# Patient Record
Sex: Male | Born: 1970
Health system: Southern US, Community
[De-identification: ages and names within clinical notes are randomized; demographics above are authoritative.]

## PROBLEM LIST (undated history)

## (undated) ENCOUNTER — Emergency Department (HOSPITAL_COMMUNITY): Admission: EM | Payer: Medicaid Other

## (undated) DIAGNOSIS — Z22322 Carrier or suspected carrier of Methicillin resistant Staphylococcus aureus: Secondary | ICD-10-CM

## (undated) DIAGNOSIS — F32A Depression, unspecified: Secondary | ICD-10-CM

## (undated) DIAGNOSIS — R358 Other polyuria: Secondary | ICD-10-CM

## (undated) DIAGNOSIS — J189 Pneumonia, unspecified organism: Secondary | ICD-10-CM

## (undated) DIAGNOSIS — F419 Anxiety disorder, unspecified: Secondary | ICD-10-CM

## (undated) DIAGNOSIS — R269 Unspecified abnormalities of gait and mobility: Secondary | ICD-10-CM

## (undated) DIAGNOSIS — K611 Rectal abscess: Secondary | ICD-10-CM

## (undated) DIAGNOSIS — E1165 Type 2 diabetes mellitus with hyperglycemia: Secondary | ICD-10-CM

## (undated) DIAGNOSIS — N5089 Other specified disorders of the male genital organs: Secondary | ICD-10-CM

## (undated) DIAGNOSIS — I10 Essential (primary) hypertension: Secondary | ICD-10-CM

## (undated) DIAGNOSIS — J45909 Unspecified asthma, uncomplicated: Secondary | ICD-10-CM

## (undated) DIAGNOSIS — K219 Gastro-esophageal reflux disease without esophagitis: Secondary | ICD-10-CM

## (undated) DIAGNOSIS — F329 Major depressive disorder, single episode, unspecified: Secondary | ICD-10-CM

## (undated) DIAGNOSIS — F640 Transsexualism: Secondary | ICD-10-CM

## (undated) DIAGNOSIS — B20 Human immunodeficiency virus [HIV] disease: Secondary | ICD-10-CM

## (undated) HISTORY — DX: Unspecified abnormalities of gait and mobility: R26.9

## (undated) HISTORY — DX: Type 2 diabetes mellitus with hyperglycemia: E11.65

## (undated) HISTORY — DX: Other polyuria: R35.8

## (undated) HISTORY — DX: Transsexualism: F64.0

## (undated) HISTORY — DX: Other specified disorders of the male genital organs: N50.89

## (undated) HISTORY — DX: Rectal abscess: K61.1

## (undated) HISTORY — DX: Anxiety disorder, unspecified: F41.9

## (undated) HISTORY — PX: APPENDECTOMY: SHX54

---

## 2004-02-28 ENCOUNTER — Emergency Department (HOSPITAL_COMMUNITY): Admission: EM | Admit: 2004-02-28 | Discharge: 2004-02-29 | Payer: Self-pay | Admitting: Emergency Medicine

## 2005-03-29 ENCOUNTER — Ambulatory Visit: Payer: Self-pay | Admitting: Family Medicine

## 2005-04-07 ENCOUNTER — Ambulatory Visit: Payer: Self-pay | Admitting: Family Medicine

## 2005-04-22 ENCOUNTER — Ambulatory Visit: Payer: Self-pay | Admitting: *Deleted

## 2005-06-30 ENCOUNTER — Emergency Department (HOSPITAL_COMMUNITY): Admission: EM | Admit: 2005-06-30 | Discharge: 2005-06-30 | Payer: Self-pay

## 2005-10-04 ENCOUNTER — Encounter: Admission: RE | Admit: 2005-10-04 | Discharge: 2005-10-04 | Payer: Self-pay | Admitting: Infectious Diseases

## 2005-10-04 ENCOUNTER — Ambulatory Visit: Payer: Self-pay | Admitting: Infectious Diseases

## 2005-10-04 ENCOUNTER — Encounter (INDEPENDENT_AMBULATORY_CARE_PROVIDER_SITE_OTHER): Payer: Self-pay | Admitting: *Deleted

## 2005-10-04 LAB — CONVERTED CEMR LAB: CD4 Count: 790 microliters

## 2005-10-15 ENCOUNTER — Emergency Department (HOSPITAL_COMMUNITY): Admission: EM | Admit: 2005-10-15 | Discharge: 2005-10-15 | Payer: Self-pay | Admitting: *Deleted

## 2005-12-05 ENCOUNTER — Ambulatory Visit: Payer: Self-pay | Admitting: Infectious Diseases

## 2005-12-20 ENCOUNTER — Ambulatory Visit: Payer: Self-pay | Admitting: Internal Medicine

## 2006-03-29 ENCOUNTER — Encounter (INDEPENDENT_AMBULATORY_CARE_PROVIDER_SITE_OTHER): Payer: Self-pay | Admitting: *Deleted

## 2006-03-29 ENCOUNTER — Encounter: Admission: RE | Admit: 2006-03-29 | Discharge: 2006-03-29 | Payer: Self-pay | Admitting: Infectious Diseases

## 2006-03-29 ENCOUNTER — Ambulatory Visit: Payer: Self-pay | Admitting: Infectious Diseases

## 2006-03-29 LAB — CONVERTED CEMR LAB
ALT: 81 units/L — ABNORMAL HIGH (ref 0–40)
AST: 44 units/L — ABNORMAL HIGH (ref 0–37)
Albumin: 4 g/dL (ref 3.5–5.2)
Basophils Absolute: 0 10*3/uL (ref 0.0–0.1)
Bilirubin Urine: NEGATIVE
CO2: 26 meq/L (ref 19–32)
Chloride: 104 meq/L (ref 96–112)
Creatinine, Ser: 0.89 mg/dL (ref 0.40–1.50)
Eosinophils Relative: 1 % (ref 0–5)
HCT: 45.3 % (ref 39.0–52.0)
HIV 1 RNA Quant: 2830 copies/mL
HIV 1 RNA Quant: 2830 copies/mL — ABNORMAL HIGH (ref <50)
HIV-1 RNA Quant, Log: 3.45 — ABNORMAL HIGH (ref <1.70)
Hemoglobin: 16.1 g/dL (ref 13.0–17.0)
Hgb urine dipstick: NEGATIVE
Ketones, ur: NEGATIVE mg/dL
Leukocyte count, blood: 7.9 10*9/L (ref 4.0–10.5)
Lymphs Abs: 3.5 10*3/uL — ABNORMAL HIGH (ref 0.7–3.3)
MCV: 89 fL (ref 78.0–100.0)
Neutro Abs: 3.1 10*3/uL (ref 1.7–7.7)
Nitrite: NEGATIVE
Platelets: 268 10*3/uL (ref 150–400)
Protein, ur: NEGATIVE mg/dL
RBC: 5.09 M/uL (ref 4.22–5.81)
Specific Gravity, Urine: 1.013 (ref 1.005–1.03)
Total Bilirubin: 0.5 mg/dL (ref 0.3–1.2)
Urine Glucose: NEGATIVE mg/dL
Urobilinogen, UA: 0.2 (ref 0.0–1.0)
pH: 6 (ref 5.0–8.0)

## 2006-04-11 ENCOUNTER — Ambulatory Visit: Payer: Self-pay | Admitting: Infectious Diseases

## 2006-04-14 DIAGNOSIS — L732 Hidradenitis suppurativa: Secondary | ICD-10-CM

## 2006-04-14 DIAGNOSIS — B20 Human immunodeficiency virus [HIV] disease: Secondary | ICD-10-CM

## 2006-07-04 ENCOUNTER — Ambulatory Visit: Payer: Self-pay | Admitting: Internal Medicine

## 2006-07-18 ENCOUNTER — Emergency Department (HOSPITAL_COMMUNITY): Admission: EM | Admit: 2006-07-18 | Discharge: 2006-07-19 | Payer: Self-pay | Admitting: *Deleted

## 2006-07-18 ENCOUNTER — Ambulatory Visit: Payer: Self-pay | Admitting: Internal Medicine

## 2006-07-20 ENCOUNTER — Encounter (INDEPENDENT_AMBULATORY_CARE_PROVIDER_SITE_OTHER): Payer: Self-pay | Admitting: Infectious Diseases

## 2006-07-21 ENCOUNTER — Encounter: Payer: Self-pay | Admitting: Infectious Diseases

## 2006-07-31 ENCOUNTER — Encounter (INDEPENDENT_AMBULATORY_CARE_PROVIDER_SITE_OTHER): Payer: Self-pay | Admitting: Specialist

## 2006-07-31 ENCOUNTER — Ambulatory Visit (HOSPITAL_COMMUNITY): Admission: RE | Admit: 2006-07-31 | Discharge: 2006-08-01 | Payer: Self-pay | Admitting: Otolaryngology

## 2006-08-07 ENCOUNTER — Encounter (INDEPENDENT_AMBULATORY_CARE_PROVIDER_SITE_OTHER): Payer: Self-pay | Admitting: *Deleted

## 2006-08-07 LAB — CONVERTED CEMR LAB

## 2006-08-20 ENCOUNTER — Encounter (INDEPENDENT_AMBULATORY_CARE_PROVIDER_SITE_OTHER): Payer: Self-pay | Admitting: *Deleted

## 2006-09-02 ENCOUNTER — Ambulatory Visit: Payer: Self-pay | Admitting: Internal Medicine

## 2006-09-02 ENCOUNTER — Inpatient Hospital Stay (HOSPITAL_COMMUNITY): Admission: EM | Admit: 2006-09-02 | Discharge: 2006-09-06 | Payer: Self-pay | Admitting: Emergency Medicine

## 2006-09-05 ENCOUNTER — Telehealth (INDEPENDENT_AMBULATORY_CARE_PROVIDER_SITE_OTHER): Payer: Self-pay | Admitting: Infectious Diseases

## 2006-09-07 ENCOUNTER — Telehealth (INDEPENDENT_AMBULATORY_CARE_PROVIDER_SITE_OTHER): Payer: Self-pay | Admitting: *Deleted

## 2006-09-20 ENCOUNTER — Encounter: Payer: Self-pay | Admitting: Infectious Diseases

## 2006-10-14 ENCOUNTER — Inpatient Hospital Stay (HOSPITAL_COMMUNITY): Admission: EM | Admit: 2006-10-14 | Discharge: 2006-10-19 | Payer: Self-pay | Admitting: Emergency Medicine

## 2006-11-14 ENCOUNTER — Inpatient Hospital Stay (HOSPITAL_COMMUNITY): Admission: EM | Admit: 2006-11-14 | Discharge: 2006-11-18 | Payer: Self-pay | Admitting: Emergency Medicine

## 2006-11-14 ENCOUNTER — Encounter (INDEPENDENT_AMBULATORY_CARE_PROVIDER_SITE_OTHER): Payer: Self-pay | Admitting: Otolaryngology

## 2006-11-14 ENCOUNTER — Ambulatory Visit: Payer: Self-pay | Admitting: Internal Medicine

## 2006-11-21 ENCOUNTER — Encounter: Admission: RE | Admit: 2006-11-21 | Discharge: 2006-11-21 | Payer: Self-pay | Admitting: Internal Medicine

## 2006-11-21 ENCOUNTER — Ambulatory Visit: Payer: Self-pay | Admitting: Internal Medicine

## 2006-11-21 ENCOUNTER — Encounter (INDEPENDENT_AMBULATORY_CARE_PROVIDER_SITE_OTHER): Payer: Self-pay | Admitting: *Deleted

## 2006-12-05 ENCOUNTER — Inpatient Hospital Stay (HOSPITAL_COMMUNITY): Admission: EM | Admit: 2006-12-05 | Discharge: 2006-12-07 | Payer: Self-pay | Admitting: Emergency Medicine

## 2006-12-05 ENCOUNTER — Telehealth: Payer: Self-pay

## 2006-12-05 ENCOUNTER — Telehealth: Payer: Self-pay | Admitting: Internal Medicine

## 2006-12-07 ENCOUNTER — Telehealth: Payer: Self-pay | Admitting: Internal Medicine

## 2006-12-08 ENCOUNTER — Ambulatory Visit: Payer: Self-pay | Admitting: Internal Medicine

## 2006-12-08 DIAGNOSIS — J390 Retropharyngeal and parapharyngeal abscess: Secondary | ICD-10-CM | POA: Insufficient documentation

## 2006-12-26 ENCOUNTER — Encounter: Payer: Self-pay | Admitting: Internal Medicine

## 2007-01-08 ENCOUNTER — Emergency Department (HOSPITAL_COMMUNITY): Admission: EM | Admit: 2007-01-08 | Discharge: 2007-01-09 | Payer: Self-pay | Admitting: Emergency Medicine

## 2007-01-11 ENCOUNTER — Inpatient Hospital Stay (HOSPITAL_COMMUNITY): Admission: EM | Admit: 2007-01-11 | Discharge: 2007-01-12 | Payer: Self-pay | Admitting: Emergency Medicine

## 2007-01-15 ENCOUNTER — Encounter: Payer: Self-pay | Admitting: Internal Medicine

## 2007-01-16 ENCOUNTER — Encounter: Payer: Self-pay | Admitting: Internal Medicine

## 2007-01-17 ENCOUNTER — Telehealth: Payer: Self-pay | Admitting: Internal Medicine

## 2007-01-17 ENCOUNTER — Telehealth: Payer: Self-pay

## 2007-02-14 ENCOUNTER — Encounter: Admission: RE | Admit: 2007-02-14 | Discharge: 2007-02-14 | Payer: Self-pay | Admitting: Internal Medicine

## 2007-02-14 ENCOUNTER — Ambulatory Visit: Payer: Self-pay | Admitting: Internal Medicine

## 2007-02-14 LAB — CONVERTED CEMR LAB
Alkaline Phosphatase: 84 units/L (ref 39–117)
BUN: 15 mg/dL (ref 6–23)
Basophils Relative: 0 % (ref 0–1)
Eosinophils Absolute: 0.1 10*3/uL (ref 0.0–0.7)
Eosinophils Relative: 1 % (ref 0–5)
Glucose, Bld: 163 mg/dL — ABNORMAL HIGH (ref 70–99)
HCT: 43.2 % (ref 39.0–52.0)
HIV-1 RNA Quant, Log: 3.26 — ABNORMAL HIGH (ref <1.70)
Lymphs Abs: 3.2 10*3/uL (ref 0.7–3.3)
MCHC: 34.7 g/dL (ref 30.0–36.0)
MCV: 92.1 fL (ref 78.0–100.0)
Platelets: 216 10*3/uL (ref 150–400)
RDW: 13.1 % (ref 11.5–14.0)
Sodium: 141 meq/L (ref 135–145)
Total Bilirubin: 0.6 mg/dL (ref 0.3–1.2)
Total Protein: 7.1 g/dL (ref 6.0–8.3)
WBC: 7.6 10*3/uL (ref 4.0–10.5)

## 2007-03-02 ENCOUNTER — Ambulatory Visit: Payer: Self-pay | Admitting: Internal Medicine

## 2007-03-02 DIAGNOSIS — R197 Diarrhea, unspecified: Secondary | ICD-10-CM

## 2007-03-02 DIAGNOSIS — J209 Acute bronchitis, unspecified: Secondary | ICD-10-CM | POA: Insufficient documentation

## 2007-03-12 ENCOUNTER — Telehealth: Payer: Self-pay

## 2007-03-23 ENCOUNTER — Ambulatory Visit: Payer: Self-pay | Admitting: Internal Medicine

## 2007-03-30 ENCOUNTER — Emergency Department (HOSPITAL_COMMUNITY): Admission: EM | Admit: 2007-03-30 | Discharge: 2007-03-30 | Payer: Self-pay | Admitting: Emergency Medicine

## 2007-04-13 ENCOUNTER — Telehealth: Payer: Self-pay | Admitting: Internal Medicine

## 2007-04-24 ENCOUNTER — Ambulatory Visit (HOSPITAL_COMMUNITY): Admission: RE | Admit: 2007-04-24 | Discharge: 2007-04-24 | Payer: Self-pay | Admitting: Infectious Diseases

## 2007-04-24 ENCOUNTER — Ambulatory Visit: Payer: Self-pay | Admitting: Infectious Diseases

## 2007-04-24 ENCOUNTER — Telehealth: Payer: Self-pay

## 2007-04-24 DIAGNOSIS — R05 Cough: Secondary | ICD-10-CM

## 2007-05-08 ENCOUNTER — Emergency Department (HOSPITAL_COMMUNITY): Admission: EM | Admit: 2007-05-08 | Discharge: 2007-05-08 | Payer: Self-pay | Admitting: Emergency Medicine

## 2007-05-11 ENCOUNTER — Emergency Department (HOSPITAL_COMMUNITY): Admission: EM | Admit: 2007-05-11 | Discharge: 2007-05-11 | Payer: Self-pay | Admitting: Emergency Medicine

## 2007-05-13 ENCOUNTER — Inpatient Hospital Stay (HOSPITAL_COMMUNITY): Admission: EM | Admit: 2007-05-13 | Discharge: 2007-05-18 | Payer: Self-pay | Admitting: Otolaryngology

## 2007-05-14 ENCOUNTER — Encounter (INDEPENDENT_AMBULATORY_CARE_PROVIDER_SITE_OTHER): Payer: Self-pay | Admitting: Otolaryngology

## 2007-05-18 ENCOUNTER — Encounter: Payer: Self-pay | Admitting: Internal Medicine

## 2007-05-21 ENCOUNTER — Telehealth: Payer: Self-pay

## 2007-06-12 ENCOUNTER — Encounter (INDEPENDENT_AMBULATORY_CARE_PROVIDER_SITE_OTHER): Payer: Self-pay | Admitting: *Deleted

## 2007-06-12 ENCOUNTER — Encounter: Payer: Self-pay | Admitting: Internal Medicine

## 2007-06-18 ENCOUNTER — Ambulatory Visit: Payer: Self-pay | Admitting: Internal Medicine

## 2007-06-18 ENCOUNTER — Encounter: Admission: RE | Admit: 2007-06-18 | Discharge: 2007-06-18 | Payer: Self-pay | Admitting: Internal Medicine

## 2007-06-18 LAB — CONVERTED CEMR LAB
HIV 1 RNA Quant: 2200 copies/mL — ABNORMAL HIGH (ref <50)
HIV-1 RNA Quant, Log: 3.34 — ABNORMAL HIGH (ref <1.70)

## 2007-06-19 ENCOUNTER — Encounter: Payer: Self-pay | Admitting: Internal Medicine

## 2007-06-19 LAB — CONVERTED CEMR LAB
BUN: 11 mg/dL (ref 6–23)
CO2: 25 meq/L (ref 19–32)
Creatinine, Ser: 0.69 mg/dL (ref 0.40–1.50)
Eosinophils Relative: 1 % (ref 0–5)
Glucose, Bld: 100 mg/dL — ABNORMAL HIGH (ref 70–99)
HCT: 47.1 % (ref 39.0–52.0)
Hemoglobin: 16.1 g/dL (ref 13.0–17.0)
Lymphocytes Relative: 45 % (ref 12–46)
Lymphs Abs: 4.1 10*3/uL — ABNORMAL HIGH (ref 0.7–4.0)
Monocytes Absolute: 1.2 10*3/uL — ABNORMAL HIGH (ref 0.1–1.0)
Total Bilirubin: 0.6 mg/dL (ref 0.3–1.2)
WBC: 9.1 10*3/uL (ref 4.0–10.5)

## 2007-06-20 ENCOUNTER — Ambulatory Visit: Payer: Self-pay | Admitting: Internal Medicine

## 2007-06-20 ENCOUNTER — Telehealth: Payer: Self-pay | Admitting: Internal Medicine

## 2007-06-20 DIAGNOSIS — L0293 Carbuncle, unspecified: Secondary | ICD-10-CM

## 2007-06-20 DIAGNOSIS — L0292 Furuncle, unspecified: Secondary | ICD-10-CM | POA: Insufficient documentation

## 2007-06-22 ENCOUNTER — Emergency Department (HOSPITAL_COMMUNITY): Admission: EM | Admit: 2007-06-22 | Discharge: 2007-06-22 | Payer: Self-pay | Admitting: Emergency Medicine

## 2007-07-13 ENCOUNTER — Ambulatory Visit: Payer: Self-pay | Admitting: Internal Medicine

## 2007-09-09 ENCOUNTER — Emergency Department (HOSPITAL_COMMUNITY): Admission: EM | Admit: 2007-09-09 | Discharge: 2007-09-09 | Payer: Self-pay | Admitting: Emergency Medicine

## 2007-09-10 ENCOUNTER — Inpatient Hospital Stay (HOSPITAL_COMMUNITY): Admission: AD | Admit: 2007-09-10 | Discharge: 2007-09-17 | Payer: Self-pay | Admitting: *Deleted

## 2007-09-10 ENCOUNTER — Ambulatory Visit: Payer: Self-pay | Admitting: *Deleted

## 2007-10-05 ENCOUNTER — Telehealth: Payer: Self-pay | Admitting: Internal Medicine

## 2007-10-05 ENCOUNTER — Emergency Department (HOSPITAL_COMMUNITY): Admission: EM | Admit: 2007-10-05 | Discharge: 2007-10-06 | Payer: Self-pay | Admitting: Emergency Medicine

## 2007-10-12 ENCOUNTER — Emergency Department (HOSPITAL_COMMUNITY): Admission: EM | Admit: 2007-10-12 | Discharge: 2007-10-12 | Payer: Self-pay | Admitting: Emergency Medicine

## 2007-10-15 ENCOUNTER — Ambulatory Visit: Payer: Self-pay | Admitting: Internal Medicine

## 2007-10-15 ENCOUNTER — Emergency Department (HOSPITAL_COMMUNITY): Admission: EM | Admit: 2007-10-15 | Discharge: 2007-10-15 | Payer: Self-pay | Admitting: Family Medicine

## 2007-10-15 ENCOUNTER — Encounter: Admission: RE | Admit: 2007-10-15 | Discharge: 2007-10-15 | Payer: Self-pay | Admitting: Internal Medicine

## 2007-10-15 LAB — CONVERTED CEMR LAB
ALT: 61 units/L — ABNORMAL HIGH (ref 0–53)
Albumin: 4.1 g/dL (ref 3.5–5.2)
Basophils Absolute: 0 10*3/uL (ref 0.0–0.1)
CO2: 23 meq/L (ref 19–32)
Calcium: 9.3 mg/dL (ref 8.4–10.5)
Chloride: 108 meq/L (ref 96–112)
Creatinine, Ser: 0.74 mg/dL (ref 0.40–1.50)
HIV 1 RNA Quant: 1860 copies/mL — ABNORMAL HIGH (ref <50)
Hemoglobin: 16.9 g/dL (ref 13.0–17.0)
Lymphocytes Relative: 38 % (ref 12–46)
Monocytes Absolute: 1.1 10*3/uL — ABNORMAL HIGH (ref 0.1–1.0)
Monocytes Relative: 10 % (ref 3–12)
Neutro Abs: 5.6 10*3/uL (ref 1.7–7.7)
RBC: 5.24 M/uL (ref 4.22–5.81)
RDW: 13 % (ref 11.5–15.5)

## 2007-10-17 ENCOUNTER — Ambulatory Visit: Payer: Self-pay | Admitting: Internal Medicine

## 2007-10-17 ENCOUNTER — Inpatient Hospital Stay (HOSPITAL_COMMUNITY): Admission: AD | Admit: 2007-10-17 | Discharge: 2007-10-19 | Payer: Self-pay | Admitting: Internal Medicine

## 2007-10-17 ENCOUNTER — Telehealth: Payer: Self-pay | Admitting: Internal Medicine

## 2007-10-17 DIAGNOSIS — L03818 Cellulitis of other sites: Secondary | ICD-10-CM

## 2007-10-17 DIAGNOSIS — L02818 Cutaneous abscess of other sites: Secondary | ICD-10-CM

## 2007-10-17 DIAGNOSIS — F418 Other specified anxiety disorders: Secondary | ICD-10-CM

## 2007-10-23 ENCOUNTER — Telehealth (INDEPENDENT_AMBULATORY_CARE_PROVIDER_SITE_OTHER): Payer: Self-pay | Admitting: *Deleted

## 2007-10-30 ENCOUNTER — Telehealth: Payer: Self-pay | Admitting: Internal Medicine

## 2007-10-31 ENCOUNTER — Ambulatory Visit: Payer: Self-pay

## 2007-10-31 ENCOUNTER — Encounter (INDEPENDENT_AMBULATORY_CARE_PROVIDER_SITE_OTHER): Payer: Self-pay | Admitting: Internal Medicine

## 2007-10-31 DIAGNOSIS — Z789 Other specified health status: Secondary | ICD-10-CM | POA: Insufficient documentation

## 2007-10-31 DIAGNOSIS — F640 Transsexualism: Secondary | ICD-10-CM

## 2007-11-01 ENCOUNTER — Encounter (INDEPENDENT_AMBULATORY_CARE_PROVIDER_SITE_OTHER): Payer: Self-pay | Admitting: *Deleted

## 2007-11-09 ENCOUNTER — Emergency Department (HOSPITAL_COMMUNITY): Admission: EM | Admit: 2007-11-09 | Discharge: 2007-11-09 | Payer: Self-pay | Admitting: Emergency Medicine

## 2007-11-26 ENCOUNTER — Ambulatory Visit: Payer: Self-pay | Admitting: Infectious Diseases

## 2007-11-28 ENCOUNTER — Telehealth (INDEPENDENT_AMBULATORY_CARE_PROVIDER_SITE_OTHER): Payer: Self-pay | Admitting: *Deleted

## 2007-12-24 ENCOUNTER — Telehealth (INDEPENDENT_AMBULATORY_CARE_PROVIDER_SITE_OTHER): Payer: Self-pay | Admitting: *Deleted

## 2007-12-26 ENCOUNTER — Telehealth (INDEPENDENT_AMBULATORY_CARE_PROVIDER_SITE_OTHER): Payer: Self-pay | Admitting: *Deleted

## 2007-12-27 ENCOUNTER — Encounter (INDEPENDENT_AMBULATORY_CARE_PROVIDER_SITE_OTHER): Payer: Self-pay | Admitting: *Deleted

## 2007-12-27 ENCOUNTER — Ambulatory Visit: Payer: Self-pay | Admitting: Infectious Diseases

## 2007-12-28 ENCOUNTER — Telehealth (INDEPENDENT_AMBULATORY_CARE_PROVIDER_SITE_OTHER): Payer: Self-pay | Admitting: *Deleted

## 2008-01-11 ENCOUNTER — Telehealth: Payer: Self-pay | Admitting: Infectious Diseases

## 2008-01-14 ENCOUNTER — Telehealth (INDEPENDENT_AMBULATORY_CARE_PROVIDER_SITE_OTHER): Payer: Self-pay | Admitting: *Deleted

## 2008-01-14 ENCOUNTER — Ambulatory Visit: Payer: Self-pay | Admitting: Infectious Diseases

## 2008-01-16 ENCOUNTER — Telehealth (INDEPENDENT_AMBULATORY_CARE_PROVIDER_SITE_OTHER): Payer: Self-pay | Admitting: *Deleted

## 2008-01-27 ENCOUNTER — Encounter: Payer: Self-pay | Admitting: Infectious Diseases

## 2008-02-07 ENCOUNTER — Ambulatory Visit: Payer: Self-pay | Admitting: Infectious Diseases

## 2008-02-07 DIAGNOSIS — J45909 Unspecified asthma, uncomplicated: Secondary | ICD-10-CM

## 2008-02-07 DIAGNOSIS — M545 Low back pain: Secondary | ICD-10-CM

## 2008-02-07 LAB — CONVERTED CEMR LAB
Basophils Absolute: 0 K/uL (ref 0.0–0.1)
Basophils Relative: 0 % (ref 0–1)
Eosinophils Absolute: 0.1 K/uL (ref 0.0–0.7)
Eosinophils Relative: 1 % (ref 0–5)
HCT: 44.8 % (ref 39.0–52.0)
Hemoglobin: 15.4 g/dL (ref 13.0–17.0)
Lymphocytes Relative: 39 % (ref 12–46)
Lymphs Abs: 3.3 K/uL (ref 0.7–4.0)
MCHC: 34.4 g/dL (ref 30.0–36.0)
MCV: 90.3 fL (ref 78.0–100.0)
Monocytes Absolute: 1.1 K/uL — ABNORMAL HIGH (ref 0.1–1.0)
Monocytes Relative: 13 % — ABNORMAL HIGH (ref 3–12)
Neutro Abs: 3.9 K/uL (ref 1.7–7.7)
Neutrophils Relative %: 46 % (ref 43–77)
Platelets: 240 K/uL (ref 150–400)
RBC: 4.96 M/uL (ref 4.22–5.81)
RDW: 12.9 % (ref 11.5–15.5)
WBC: 8.4 10*3/microliter (ref 4.0–10.5)

## 2008-02-11 ENCOUNTER — Telehealth: Payer: Self-pay | Admitting: Infectious Diseases

## 2008-02-11 ENCOUNTER — Ambulatory Visit (HOSPITAL_COMMUNITY): Admission: RE | Admit: 2008-02-11 | Discharge: 2008-02-11 | Payer: Self-pay | Admitting: Infectious Diseases

## 2008-02-28 ENCOUNTER — Telehealth (INDEPENDENT_AMBULATORY_CARE_PROVIDER_SITE_OTHER): Payer: Self-pay | Admitting: *Deleted

## 2008-03-25 ENCOUNTER — Telehealth: Payer: Self-pay

## 2008-04-08 ENCOUNTER — Encounter: Payer: Self-pay | Admitting: Infectious Diseases

## 2008-04-27 ENCOUNTER — Observation Stay (HOSPITAL_COMMUNITY): Admission: EM | Admit: 2008-04-27 | Discharge: 2008-04-29 | Payer: Self-pay | Admitting: Emergency Medicine

## 2008-04-27 ENCOUNTER — Ambulatory Visit: Payer: Self-pay | Admitting: Internal Medicine

## 2008-05-02 ENCOUNTER — Telehealth: Payer: Self-pay | Admitting: Infectious Diseases

## 2008-05-06 ENCOUNTER — Ambulatory Visit: Payer: Self-pay | Admitting: Infectious Diseases

## 2008-05-07 ENCOUNTER — Telehealth: Payer: Self-pay | Admitting: Infectious Diseases

## 2008-05-12 ENCOUNTER — Telehealth (INDEPENDENT_AMBULATORY_CARE_PROVIDER_SITE_OTHER): Payer: Self-pay | Admitting: *Deleted

## 2008-05-14 ENCOUNTER — Encounter: Payer: Self-pay | Admitting: Infectious Diseases

## 2008-05-15 ENCOUNTER — Telehealth: Payer: Self-pay

## 2008-05-19 ENCOUNTER — Encounter: Payer: Self-pay | Admitting: Infectious Diseases

## 2008-05-27 ENCOUNTER — Telehealth (INDEPENDENT_AMBULATORY_CARE_PROVIDER_SITE_OTHER): Payer: Self-pay | Admitting: *Deleted

## 2008-06-17 ENCOUNTER — Ambulatory Visit: Payer: Self-pay | Admitting: Infectious Diseases

## 2008-06-18 ENCOUNTER — Telehealth (INDEPENDENT_AMBULATORY_CARE_PROVIDER_SITE_OTHER): Payer: Self-pay | Admitting: *Deleted

## 2008-07-15 ENCOUNTER — Emergency Department (HOSPITAL_COMMUNITY): Admission: EM | Admit: 2008-07-15 | Discharge: 2008-07-15 | Payer: Self-pay | Admitting: Emergency Medicine

## 2008-07-15 ENCOUNTER — Telehealth: Payer: Self-pay | Admitting: Infectious Diseases

## 2008-07-16 ENCOUNTER — Telehealth: Payer: Self-pay | Admitting: Infectious Diseases

## 2008-07-22 ENCOUNTER — Telehealth: Payer: Self-pay

## 2008-07-25 ENCOUNTER — Ambulatory Visit: Payer: Self-pay | Admitting: Psychiatry

## 2008-07-25 ENCOUNTER — Inpatient Hospital Stay (HOSPITAL_COMMUNITY): Admission: RE | Admit: 2008-07-25 | Discharge: 2008-08-06 | Payer: Self-pay | Admitting: Psychiatry

## 2008-07-28 ENCOUNTER — Telehealth: Payer: Self-pay | Admitting: Infectious Diseases

## 2008-08-07 ENCOUNTER — Emergency Department (HOSPITAL_COMMUNITY): Admission: EM | Admit: 2008-08-07 | Discharge: 2008-08-08 | Payer: Self-pay | Admitting: Emergency Medicine

## 2008-08-11 ENCOUNTER — Encounter: Payer: Self-pay | Admitting: Infectious Diseases

## 2008-08-12 ENCOUNTER — Ambulatory Visit: Payer: Self-pay | Admitting: Infectious Diseases

## 2008-08-12 LAB — CONVERTED CEMR LAB
Band Neutrophils: 0 % (ref 0–10)
Basophils Absolute: 0 10*3/uL (ref 0.0–0.1)
Chloride: 104 meq/L (ref 96–112)
Creatinine, Ser: 0.81 mg/dL (ref 0.40–1.50)
HIV 1 RNA Quant: 12400 copies/mL — ABNORMAL HIGH (ref <48)
HIV-1 RNA Quant, Log: 4.09 — ABNORMAL HIGH (ref <1.68)
Hgb A1c MFr Bld: 7.1 %
Lymphocytes Relative: 42 % (ref 12–46)
Lymphs Abs: 3.8 10*3/uL (ref 0.7–4.0)
Neutrophils Relative %: 43 % (ref 43–77)
Platelets: 247 10*3/uL (ref 150–400)
Potassium: 4.4 meq/L (ref 3.5–5.3)
RBC: 4.82 M/uL (ref 4.22–5.81)
Total Bilirubin: 0.4 mg/dL (ref 0.3–1.2)
Total Protein: 7.4 g/dL (ref 6.0–8.3)
WBC: 9.1 10*3/uL (ref 4.0–10.5)

## 2008-08-25 ENCOUNTER — Encounter: Payer: Self-pay | Admitting: Infectious Diseases

## 2008-09-03 ENCOUNTER — Encounter (INDEPENDENT_AMBULATORY_CARE_PROVIDER_SITE_OTHER): Payer: Self-pay | Admitting: *Deleted

## 2008-09-09 ENCOUNTER — Telehealth: Payer: Self-pay | Admitting: Infectious Diseases

## 2008-09-09 ENCOUNTER — Ambulatory Visit: Payer: Self-pay | Admitting: Infectious Diseases

## 2008-09-09 DIAGNOSIS — R7309 Other abnormal glucose: Secondary | ICD-10-CM | POA: Insufficient documentation

## 2008-09-09 LAB — CONVERTED CEMR LAB
ALT: 224 units/L — ABNORMAL HIGH (ref 0–53)
AST: 128 units/L — ABNORMAL HIGH (ref 0–37)
Albumin: 4.4 g/dL (ref 3.5–5.2)
Alkaline Phosphatase: 99 units/L (ref 39–117)
BUN: 13 mg/dL (ref 6–23)
CO2: 25 meq/L (ref 19–32)
Calcium: 9.3 mg/dL (ref 8.4–10.5)
Chloride: 102 meq/L (ref 96–112)
Creatinine, Ser: 0.7 mg/dL (ref 0.40–1.50)
GFR calc Af Amer: >60 mL/min (ref >60)
GFR calc non Af Amer: >60 mL/min (ref >60)
Glucose, Bld: 158 mg/dL — ABNORMAL HIGH (ref 70–99)
HIV 1 RNA Quant: 4680 copies/mL — ABNORMAL HIGH (ref <48)
HIV-1 RNA Quant, Log: 3.67 — ABNORMAL HIGH (ref <1.68)
Potassium: 4.4 meq/L (ref 3.5–5.3)
Sodium: 138 meq/L (ref 135–145)
Total Bilirubin: 0.7 mg/dL (ref 0.3–1.2)
Total Protein: 7.8 g/dL (ref 6.0–8.3)

## 2008-09-10 ENCOUNTER — Telehealth (INDEPENDENT_AMBULATORY_CARE_PROVIDER_SITE_OTHER): Payer: Self-pay | Admitting: *Deleted

## 2008-09-15 ENCOUNTER — Ambulatory Visit: Payer: Self-pay | Admitting: Infectious Diseases

## 2008-09-15 ENCOUNTER — Encounter: Admission: RE | Admit: 2008-09-15 | Discharge: 2008-10-29 | Payer: Self-pay | Admitting: Infectious Diseases

## 2008-09-15 LAB — CONVERTED CEMR LAB
ALT: 208 units/L — ABNORMAL HIGH (ref 0–53)
AST: 110 units/L — ABNORMAL HIGH (ref 0–37)
Albumin: 4.3 g/dL (ref 3.5–5.2)
BUN: 11 mg/dL (ref 6–23)
Basophils Absolute: 0 10*3/uL (ref 0.0–0.1)
Basophils Relative: 0 % (ref 0–1)
Bilirubin, Direct: 0.1 mg/dL (ref 0.0–0.3)
Chloride: 105 meq/L (ref 96–112)
Eosinophils Relative: 1 % (ref 0–5)
Glucose, Bld: 120 mg/dL — ABNORMAL HIGH (ref 70–99)
HCT: 44.8 % (ref 39.0–52.0)
Hemoglobin: 15.4 g/dL (ref 13.0–17.0)
MCHC: 34.4 g/dL (ref 30.0–36.0)
MCV: 89.8 fL (ref 78.0–100.0)
Monocytes Absolute: 0.9 10*3/uL (ref 0.1–1.0)
Phosphorus: 3.2 mg/dL (ref 2.3–4.6)
Potassium: 4.5 meq/L (ref 3.5–5.3)
RDW: 13.3 % (ref 11.5–15.5)

## 2008-09-17 ENCOUNTER — Telehealth: Payer: Self-pay | Admitting: Infectious Diseases

## 2008-09-22 ENCOUNTER — Encounter: Payer: Self-pay | Admitting: Infectious Diseases

## 2008-09-23 ENCOUNTER — Ambulatory Visit: Payer: Self-pay | Admitting: Infectious Diseases

## 2008-09-27 ENCOUNTER — Encounter: Payer: Self-pay | Admitting: Internal Medicine

## 2008-09-27 ENCOUNTER — Observation Stay (HOSPITAL_COMMUNITY): Admission: EM | Admit: 2008-09-27 | Discharge: 2008-09-27 | Payer: Self-pay | Admitting: Emergency Medicine

## 2008-09-27 ENCOUNTER — Ambulatory Visit: Payer: Self-pay | Admitting: *Deleted

## 2008-09-27 DIAGNOSIS — E876 Hypokalemia: Secondary | ICD-10-CM | POA: Insufficient documentation

## 2008-09-27 DIAGNOSIS — R7402 Elevation of levels of lactic acid dehydrogenase (LDH): Secondary | ICD-10-CM | POA: Insufficient documentation

## 2008-09-27 DIAGNOSIS — R740 Nonspecific elevation of levels of transaminase and lactic acid dehydrogenase [LDH]: Secondary | ICD-10-CM

## 2008-09-27 DIAGNOSIS — R220 Localized swelling, mass and lump, head: Secondary | ICD-10-CM

## 2008-09-27 DIAGNOSIS — R079 Chest pain, unspecified: Secondary | ICD-10-CM

## 2008-09-27 DIAGNOSIS — R221 Localized swelling, mass and lump, neck: Secondary | ICD-10-CM

## 2008-09-29 ENCOUNTER — Ambulatory Visit: Payer: Self-pay | Admitting: Infectious Diseases

## 2008-09-29 ENCOUNTER — Encounter: Payer: Self-pay | Admitting: Internal Medicine

## 2008-09-29 LAB — CONVERTED CEMR LAB
ALT: 249 units/L — ABNORMAL HIGH (ref 0–53)
AST: 142 units/L — ABNORMAL HIGH (ref 0–37)
Albumin: 3.9 g/dL (ref 3.5–5.2)
Alkaline Phosphatase: 85 units/L (ref 39–117)
Bilirubin, Direct: 0.2 mg/dL (ref 0.0–0.3)
Indirect Bilirubin: 0.5 mg/dL (ref 0.0–0.9)
Total Bilirubin: 0.7 mg/dL (ref 0.3–1.2)
Total Protein: 7 g/dL (ref 6.0–8.3)

## 2008-09-30 ENCOUNTER — Ambulatory Visit: Payer: Self-pay | Admitting: Infectious Diseases

## 2008-10-02 ENCOUNTER — Ambulatory Visit: Payer: Self-pay | Admitting: Internal Medicine

## 2008-10-04 IMAGING — CT CT NECK W/O CM
2 series · 14 of 24 positions shown · IV contrast (agent unspecified)
Comparison: 05/11/2007 and earlier.

CLINICAL DATA: 35-year-old male with abscess of the jaw, recurrent left salivary gland stones.  The patient reports allergy to IV contrast. 
CT OF THE NECK WITHOUT CONTRAST:
Multidetector CT imaging of the neck was performed following the standard protocol without IV contrast.

[Series 400: reformatted · sagittal · 0.61mm/px · 11 of 111 slices shown (1 of 2)]
[im 10/111  bone]
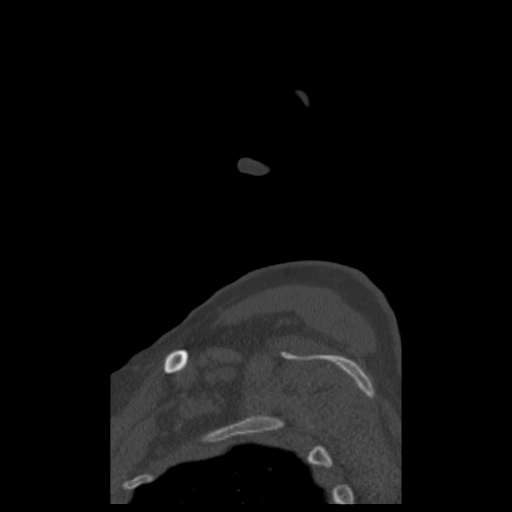
[im 19/111  bone]
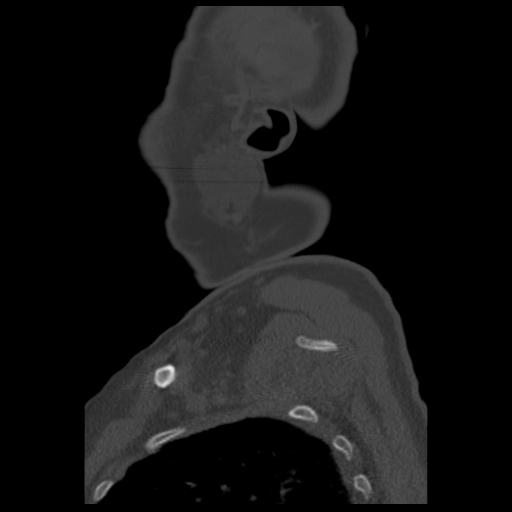
[im 28/111  bone]
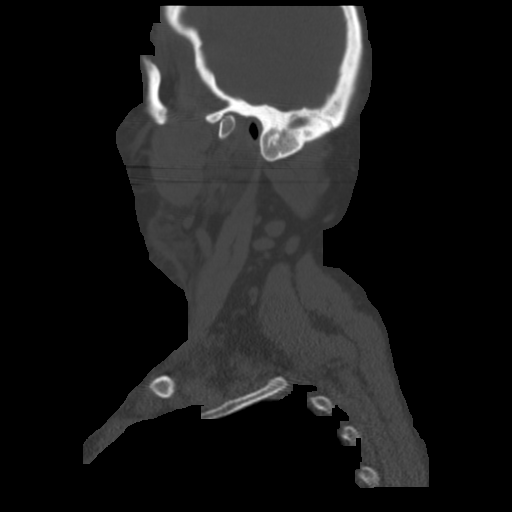
[im 37/111  bone]
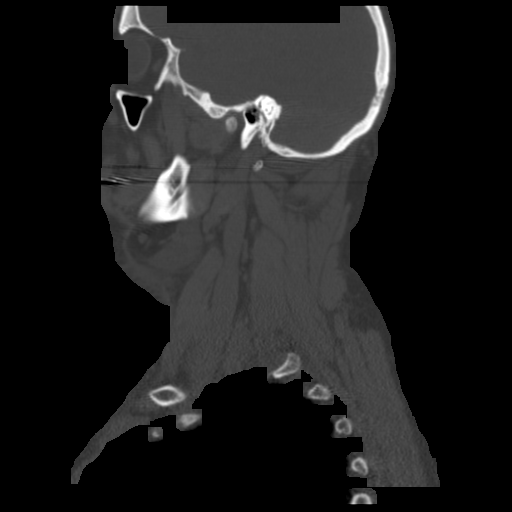
[im 46/111  bone]
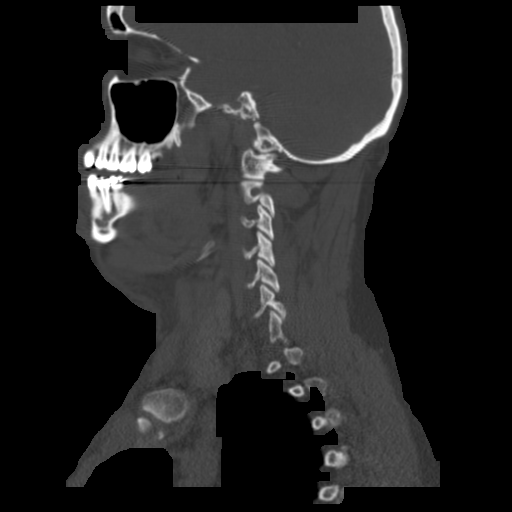
[im 56/111  bone]
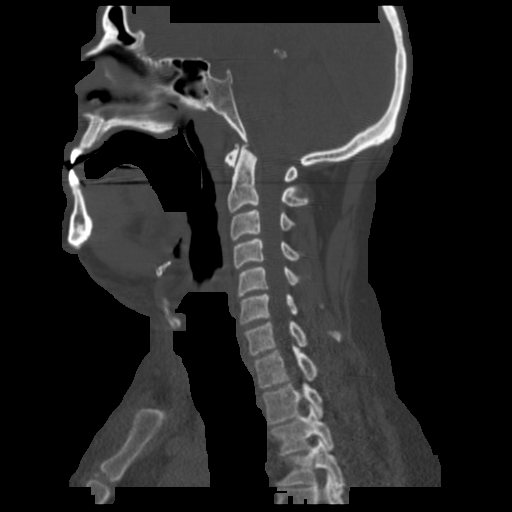
[im 65/111  bone]
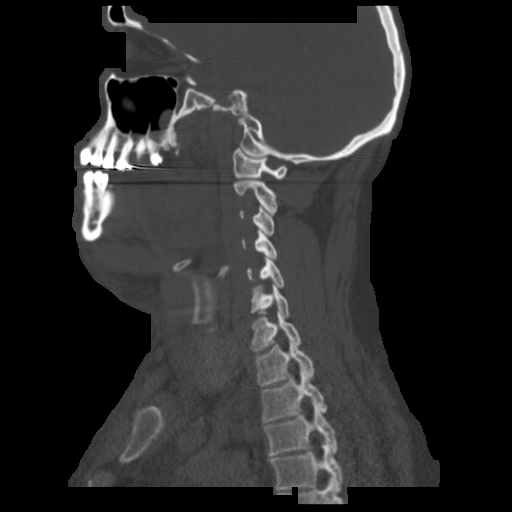
[im 74/111  bone]
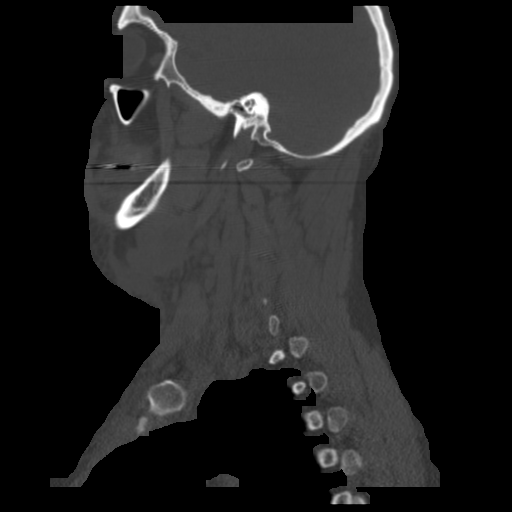
[im 83/111  bone]
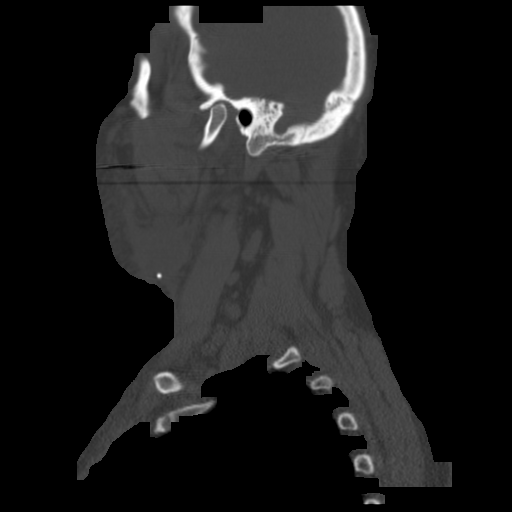
[im 92/111  bone]
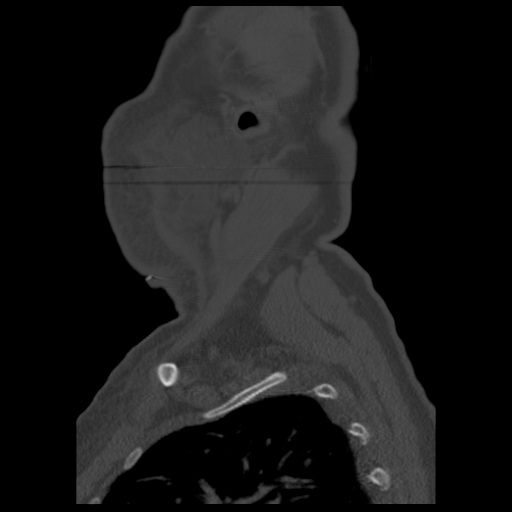
[im 101/111  bone]
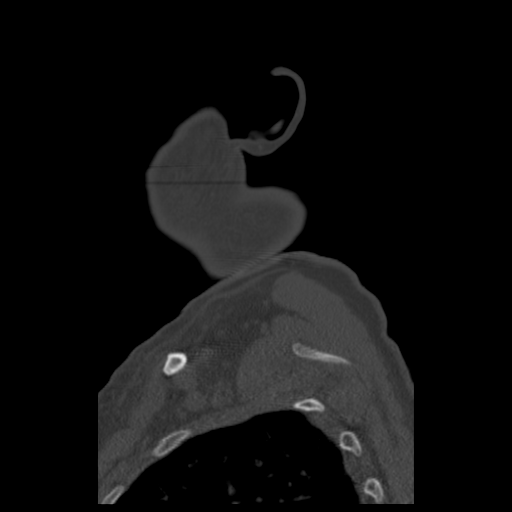

[Series 401: reformatted · coronal · 0.61mm/px · 3 of 121 slices shown (2 of 2)]
[im 49/121  bone]
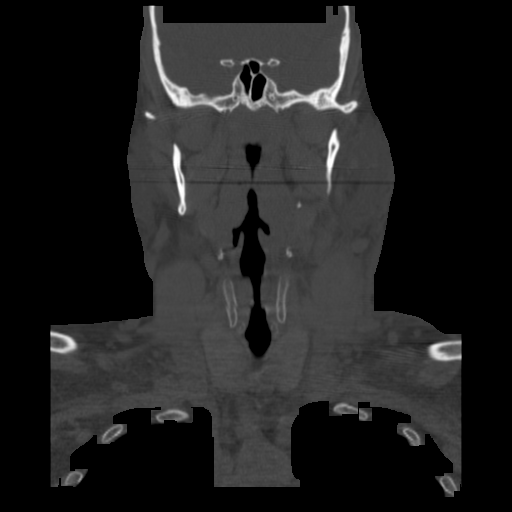
[im 61/121  bone]
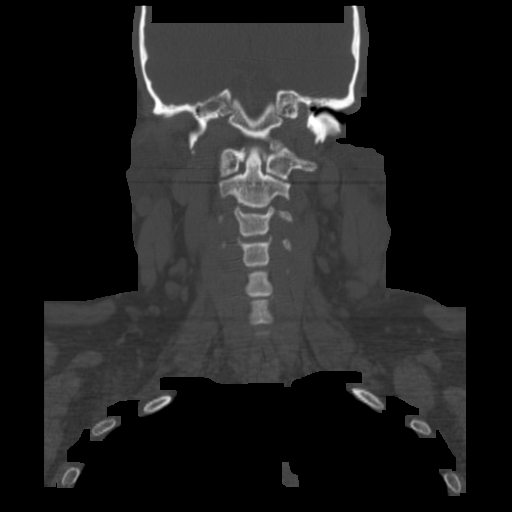
[im 73/121  bone]
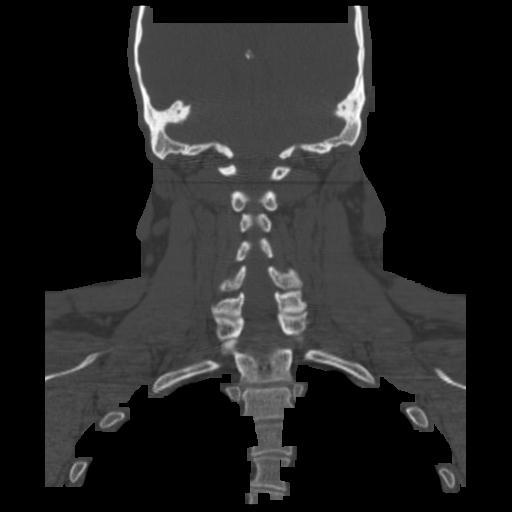

[14 of 24 positions shown; findings below may reference images not displayed]

FINDINGS: Scattered cervical lymph nodes measuring up to 9.3 mm in short-axis (left greater than right levels 1, 2 and 3) are unchanged.  There is a small metallic marker on the skin overlying the enlarged, inflamed and poorly delineated left submandibular gland which contains a stable 4-5 mm calculus. Previously seen ill-defined low density area has enlarged to involve more of the region of the left submandibular gland, but this remains phlegmonous in nature and does not suggest a drainable collection at this time.  The inflammation extends to involve the inferior most aspect of the left parotid gland and asymmetrically enlarged left intraparotid nodes are re-identified.  Subcutaneous fat stranding throughout the left face has increased and crosses midline in the submental region.  
  Probable post-infectious calcification in the region of the left tonsillar pillar is unchanged.  There is effacement of the left hypopharynx, stable.  Visualized lung apices are clear with mild respiratory motion artifact.  Noncontrasted appearance of the upper mediastinum is stable and within normal limits. Thyroid and larynx are within normal limits.  Visualized brain parenchyma, orbits, and osseous structures are within normal limits.  There is stable small mucous retention cyst in the left maxillary sinus.  The mastoid air cells are hypoplastic and appear chronically opacified.
IMPRESSION: 1.  Increasing left facial inflammation associated with the left submandibular gland region. Stable 4-5 mm calculus at the inferior aspect of the gland.  
2.  Increasing phlegmonous mass within the submandibular gland, probably representing a developing abscess, but no drainable fluid is suspected at this time.  
3. Stable left greater than right reactive cervical lymph nodes.  
4. Chronic mastoid air cell inflammatory changes.

## 2008-10-06 ENCOUNTER — Encounter: Payer: Self-pay | Admitting: Infectious Diseases

## 2008-10-07 ENCOUNTER — Encounter: Payer: Self-pay | Admitting: Infectious Diseases

## 2008-10-17 ENCOUNTER — Encounter: Payer: Self-pay | Admitting: Infectious Diseases

## 2008-10-17 ENCOUNTER — Telehealth (INDEPENDENT_AMBULATORY_CARE_PROVIDER_SITE_OTHER): Payer: Self-pay | Admitting: *Deleted

## 2008-10-20 ENCOUNTER — Ambulatory Visit: Payer: Self-pay | Admitting: Infectious Disease

## 2008-10-20 ENCOUNTER — Encounter: Payer: Self-pay | Admitting: Infectious Diseases

## 2008-10-23 LAB — CONVERTED CEMR LAB
ALT: 132 units/L — ABNORMAL HIGH (ref 0–53)
AST: 83 units/L — ABNORMAL HIGH (ref 0–37)
Alkaline Phosphatase: 84 units/L (ref 39–117)
Bilirubin, Direct: 0.2 mg/dL (ref 0.0–0.3)
Indirect Bilirubin: 0.4 mg/dL (ref 0.0–0.9)
Total Bilirubin: 0.6 mg/dL (ref 0.3–1.2)

## 2008-10-29 ENCOUNTER — Ambulatory Visit: Payer: Self-pay | Admitting: Infectious Diseases

## 2008-11-01 ENCOUNTER — Emergency Department (HOSPITAL_COMMUNITY): Admission: EM | Admit: 2008-11-01 | Discharge: 2008-11-01 | Payer: Self-pay | Admitting: Emergency Medicine

## 2008-11-04 ENCOUNTER — Ambulatory Visit: Payer: Self-pay | Admitting: Infectious Diseases

## 2008-11-04 ENCOUNTER — Ambulatory Visit: Payer: Self-pay | Admitting: Infectious Disease

## 2008-11-04 ENCOUNTER — Encounter: Payer: Self-pay | Admitting: Infectious Diseases

## 2008-11-04 DIAGNOSIS — F411 Generalized anxiety disorder: Secondary | ICD-10-CM

## 2008-11-04 DIAGNOSIS — M719 Bursopathy, unspecified: Secondary | ICD-10-CM

## 2008-11-04 DIAGNOSIS — M67919 Unspecified disorder of synovium and tendon, unspecified shoulder: Secondary | ICD-10-CM | POA: Insufficient documentation

## 2008-11-04 LAB — CONVERTED CEMR LAB
Albumin: 4.3 g/dL (ref 3.5–5.2)
Bilirubin, Direct: 0.1 mg/dL (ref 0.0–0.3)
Blood Glucose, Fingerstick: 104
CD4 Count: 736 microliters
CO2: 24 meq/L (ref 19–32)
Calcium: 9 mg/dL (ref 8.4–10.5)
Creatinine, Ser: 0.76 mg/dL (ref 0.40–1.50)
GFR calc Af Amer: >60 mL/min (ref >60)
GFR calc non Af Amer: >60 mL/min (ref >60)
Glucose, Urine, Semiquant: NEGATIVE
Phosphorus: 2.6 mg/dL (ref 2.3–4.6)
Protein, U semiquant: 100
Sodium: 142 meq/L (ref 135–145)
Specific Gravity, Urine: >=1.03
Total Bilirubin: 0.6 mg/dL (ref 0.3–1.2)
WBC Urine, dipstick: NEGATIVE
pH: 5.5

## 2008-11-06 ENCOUNTER — Telehealth (INDEPENDENT_AMBULATORY_CARE_PROVIDER_SITE_OTHER): Payer: Self-pay | Admitting: *Deleted

## 2008-11-13 ENCOUNTER — Ambulatory Visit: Payer: Self-pay | Admitting: Infectious Diseases

## 2008-11-13 LAB — CONVERTED CEMR LAB
ALT: 105 units/L — ABNORMAL HIGH (ref 0–53)
AST: 48 units/L — ABNORMAL HIGH (ref 0–37)
Albumin: 4.1 g/dL (ref 3.5–5.2)
Basophils Absolute: 0 10*3/uL (ref 0.0–0.1)
Basophils Relative: 0 % (ref 0–1)
CO2: 27 meq/L (ref 19–32)
Calcium: 9.2 mg/dL (ref 8.4–10.5)
Chloride: 102 meq/L (ref 96–112)
Creatinine, Ser: 0.67 mg/dL (ref 0.40–1.50)
Eosinophils Absolute: 0 10*3/uL (ref 0.0–0.7)
GFR calc Af Amer: >60 mL/min (ref >60)
MCHC: 32.3 g/dL (ref 30.0–36.0)
MCV: 94.1 fL (ref 78.0–100.0)
Monocytes Relative: 10 % (ref 3–12)
Neutrophils Relative %: 61 % (ref 43–77)
Potassium: 4 meq/L (ref 3.5–5.3)
RBC: 5.09 M/uL (ref 4.22–5.81)
RDW: 12.9 % (ref 11.5–15.5)
Sodium: 134 meq/L — ABNORMAL LOW (ref 135–145)
Total Protein: 7.2 g/dL (ref 6.0–8.3)

## 2008-11-14 ENCOUNTER — Encounter: Payer: Self-pay | Admitting: *Deleted

## 2008-11-17 ENCOUNTER — Telehealth: Payer: Self-pay | Admitting: Infectious Diseases

## 2008-11-25 ENCOUNTER — Encounter: Payer: Self-pay | Admitting: Infectious Diseases

## 2008-12-01 ENCOUNTER — Encounter: Payer: Self-pay | Admitting: Infectious Diseases

## 2008-12-02 ENCOUNTER — Ambulatory Visit: Payer: Self-pay | Admitting: Infectious Diseases

## 2008-12-02 ENCOUNTER — Encounter: Payer: Self-pay | Admitting: Infectious Diseases

## 2008-12-02 LAB — CONVERTED CEMR LAB
AST: 24 units/L (ref 0–37)
Alkaline Phosphatase: 88 units/L (ref 39–117)
Bilirubin, Direct: 0.2 mg/dL (ref 0.0–0.3)
CD4 Count: 851 microliters
CO2: 24 meq/L (ref 19–32)
Chloride: 107 meq/L (ref 96–112)
Glucose, Bld: 99 mg/dL (ref 70–99)
HIV 1 RNA Quant: 486 copies/mL
Microalb Creat Ratio: 15.4 mg/g (ref 0.0–30.0)
Sodium: 142 meq/L (ref 135–145)
Total Bilirubin: 1.1 mg/dL (ref 0.3–1.2)

## 2008-12-10 ENCOUNTER — Encounter: Admission: RE | Admit: 2008-12-10 | Discharge: 2009-01-16 | Payer: Self-pay | Admitting: Infectious Diseases

## 2008-12-22 ENCOUNTER — Telehealth: Payer: Self-pay | Admitting: Infectious Diseases

## 2008-12-23 ENCOUNTER — Encounter: Payer: Self-pay | Admitting: Infectious Diseases

## 2008-12-31 ENCOUNTER — Ambulatory Visit: Payer: Self-pay | Admitting: Infectious Diseases

## 2008-12-31 ENCOUNTER — Encounter: Payer: Self-pay | Admitting: Infectious Diseases

## 2008-12-31 ENCOUNTER — Telehealth: Payer: Self-pay | Admitting: Infectious Diseases

## 2008-12-31 LAB — CONVERTED CEMR LAB
Alkaline Phosphatase: 89 units/L (ref 39–117)
Eosinophils Absolute: 0.1 10*3/uL (ref 0.0–0.7)
Eosinophils Relative: 1 % (ref 0–5)
HCT: 47.9 % (ref 39.0–52.0)
HIV 1 RNA Quant: 117 copies/mL
Hemoglobin: 16.2 g/dL (ref 13.0–17.0)
Indirect Bilirubin: 1 mg/dL — ABNORMAL HIGH (ref 0.0–0.9)
Lymphs Abs: 2.6 10*3/uL (ref 0.7–4.0)
MCV: 93.4 fL (ref 78.0–100.0)
Monocytes Absolute: 1.1 10*3/uL — ABNORMAL HIGH (ref 0.1–1.0)
Monocytes Relative: 11 % (ref 3–12)
Neutrophils Relative %: 61 % (ref 43–77)
Potassium: 4.2 meq/L (ref 3.5–5.3)
RBC: 5.13 M/uL (ref 4.22–5.81)
Sodium: 140 meq/L (ref 135–145)
Total Protein: 7.4 g/dL (ref 6.0–8.3)
WBC: 9.9 10*3/uL (ref 4.0–10.5)

## 2009-01-13 ENCOUNTER — Ambulatory Visit: Payer: Self-pay | Admitting: Infectious Diseases

## 2009-01-13 DIAGNOSIS — M25519 Pain in unspecified shoulder: Secondary | ICD-10-CM

## 2009-01-14 ENCOUNTER — Telehealth: Payer: Self-pay | Admitting: Infectious Diseases

## 2009-01-14 ENCOUNTER — Telehealth (INDEPENDENT_AMBULATORY_CARE_PROVIDER_SITE_OTHER): Payer: Self-pay | Admitting: *Deleted

## 2009-01-14 ENCOUNTER — Encounter: Payer: Self-pay | Admitting: Sports Medicine

## 2009-01-14 ENCOUNTER — Encounter: Payer: Self-pay | Admitting: Infectious Diseases

## 2009-01-16 ENCOUNTER — Ambulatory Visit: Payer: Self-pay | Admitting: Family Medicine

## 2009-01-16 DIAGNOSIS — M12819 Other specific arthropathies, not elsewhere classified, unspecified shoulder: Secondary | ICD-10-CM | POA: Insufficient documentation

## 2009-01-19 ENCOUNTER — Encounter: Payer: Self-pay | Admitting: Family Medicine

## 2009-02-03 ENCOUNTER — Telehealth: Payer: Self-pay | Admitting: Infectious Diseases

## 2009-02-04 ENCOUNTER — Telehealth: Payer: Self-pay

## 2009-02-09 ENCOUNTER — Ambulatory Visit: Payer: Self-pay | Admitting: Family Medicine

## 2009-02-09 ENCOUNTER — Telehealth: Payer: Self-pay | Admitting: Infectious Diseases

## 2009-02-10 ENCOUNTER — Encounter: Payer: Self-pay | Admitting: Family Medicine

## 2009-02-18 ENCOUNTER — Telehealth (INDEPENDENT_AMBULATORY_CARE_PROVIDER_SITE_OTHER): Payer: Self-pay | Admitting: *Deleted

## 2009-02-19 ENCOUNTER — Ambulatory Visit: Payer: Self-pay | Admitting: Infectious Diseases

## 2009-02-25 ENCOUNTER — Ambulatory Visit: Payer: Self-pay | Admitting: Infectious Diseases

## 2009-02-25 ENCOUNTER — Ambulatory Visit (HOSPITAL_COMMUNITY): Admission: RE | Admit: 2009-02-25 | Discharge: 2009-02-25 | Payer: Self-pay | Admitting: Infectious Diseases

## 2009-02-25 LAB — CONVERTED CEMR LAB
Albumin: 4.3 g/dL (ref 3.5–5.2)
Alkaline Phosphatase: 78 units/L (ref 39–117)
CO2: 24 meq/L (ref 19–32)
Chloride: 103 meq/L (ref 96–112)
Creatinine, Ser: 0.81 mg/dL (ref 0.40–1.50)
Creatinine, Urine: 223.4 mg/dL
HDL: 39 mg/dL — ABNORMAL LOW (ref >39)
Hep A Total Ab: NEGATIVE
LDL Cholesterol: 90 mg/dL (ref 0–99)
Phosphorus: 4 mg/dL (ref 2.3–4.6)
Total Bilirubin: 1.1 mg/dL (ref 0.3–1.2)
Total CHOL/HDL Ratio: 3.7
Total Protein, Urine: 15

## 2009-02-27 ENCOUNTER — Telehealth (INDEPENDENT_AMBULATORY_CARE_PROVIDER_SITE_OTHER): Payer: Self-pay | Admitting: *Deleted

## 2009-03-06 ENCOUNTER — Telehealth: Payer: Self-pay | Admitting: Infectious Diseases

## 2009-03-06 ENCOUNTER — Ambulatory Visit: Payer: Self-pay | Admitting: Infectious Diseases

## 2009-03-09 ENCOUNTER — Emergency Department (HOSPITAL_COMMUNITY): Admission: EM | Admit: 2009-03-09 | Discharge: 2009-03-09 | Payer: Self-pay | Admitting: Emergency Medicine

## 2009-03-10 ENCOUNTER — Telehealth: Payer: Self-pay | Admitting: Infectious Diseases

## 2009-03-11 ENCOUNTER — Encounter: Payer: Self-pay | Admitting: Infectious Diseases

## 2009-03-12 ENCOUNTER — Ambulatory Visit: Payer: Self-pay | Admitting: Infectious Diseases

## 2009-03-13 ENCOUNTER — Telehealth (INDEPENDENT_AMBULATORY_CARE_PROVIDER_SITE_OTHER): Payer: Self-pay | Admitting: *Deleted

## 2009-03-18 ENCOUNTER — Telehealth (INDEPENDENT_AMBULATORY_CARE_PROVIDER_SITE_OTHER): Payer: Self-pay | Admitting: *Deleted

## 2009-03-30 ENCOUNTER — Telehealth (INDEPENDENT_AMBULATORY_CARE_PROVIDER_SITE_OTHER): Payer: Self-pay | Admitting: *Deleted

## 2009-04-06 ENCOUNTER — Telehealth: Payer: Self-pay | Admitting: Infectious Diseases

## 2009-04-10 ENCOUNTER — Telehealth (INDEPENDENT_AMBULATORY_CARE_PROVIDER_SITE_OTHER): Payer: Self-pay | Admitting: *Deleted

## 2009-04-17 ENCOUNTER — Ambulatory Visit: Payer: Self-pay | Admitting: Infectious Diseases

## 2009-04-17 LAB — CONVERTED CEMR LAB
ALT: 58 units/L — ABNORMAL HIGH (ref 0–53)
AST: 37 units/L (ref 0–37)
BUN: 8 mg/dL (ref 6–23)
Bilirubin, Direct: 0.2 mg/dL (ref 0.0–0.3)
CD4 Count: 713 microliters
Calcium: 8.4 mg/dL (ref 8.4–10.5)
Cholesterol: 143 mg/dL (ref 0–200)
Glucose, Bld: 93 mg/dL (ref 70–99)
Indirect Bilirubin: 0.4 mg/dL (ref 0.0–0.9)

## 2009-05-06 ENCOUNTER — Ambulatory Visit: Payer: Self-pay | Admitting: Infectious Diseases

## 2009-05-27 ENCOUNTER — Ambulatory Visit: Payer: Self-pay | Admitting: Infectious Diseases

## 2009-05-27 LAB — CONVERTED CEMR LAB
ALT: 43 units/L (ref 0–53)
Albumin: 4.4 g/dL (ref 3.5–5.2)
Alkaline Phosphatase: 86 units/L (ref 39–117)
CO2: 23 meq/L (ref 19–32)
Calcium: 9.1 mg/dL (ref 8.4–10.5)
Chloride: 104 meq/L (ref 96–112)
Creatinine, Ser: 0.77 mg/dL (ref 0.40–1.50)
Glucose, Bld: 157 mg/dL — ABNORMAL HIGH (ref 70–99)
Sodium: 140 meq/L (ref 135–145)
Total Protein: 7.1 g/dL (ref 6.0–8.3)

## 2009-06-09 ENCOUNTER — Telehealth (INDEPENDENT_AMBULATORY_CARE_PROVIDER_SITE_OTHER): Payer: Self-pay | Admitting: *Deleted

## 2009-06-11 ENCOUNTER — Telehealth (INDEPENDENT_AMBULATORY_CARE_PROVIDER_SITE_OTHER): Payer: Self-pay | Admitting: *Deleted

## 2009-06-11 ENCOUNTER — Ambulatory Visit: Payer: Self-pay | Admitting: Infectious Disease

## 2009-06-11 DIAGNOSIS — G894 Chronic pain syndrome: Secondary | ICD-10-CM | POA: Insufficient documentation

## 2009-07-01 ENCOUNTER — Encounter: Payer: Self-pay | Admitting: Infectious Diseases

## 2009-07-14 ENCOUNTER — Telehealth (INDEPENDENT_AMBULATORY_CARE_PROVIDER_SITE_OTHER): Payer: Self-pay | Admitting: *Deleted

## 2009-07-14 ENCOUNTER — Ambulatory Visit: Payer: Self-pay | Admitting: Infectious Diseases

## 2009-07-14 LAB — CONVERTED CEMR LAB
AST: 22 units/L (ref 0–37)
Albumin: 4.4 g/dL (ref 3.5–5.2)
Bilirubin, Direct: 0.1 mg/dL (ref 0.0–0.3)
CD4 Count: 1163 microliters
CO2: 27 meq/L (ref 19–32)
Calcium: 9.2 mg/dL (ref 8.4–10.5)
Chloride: 104 meq/L (ref 96–112)
Glucose, Bld: 79 mg/dL (ref 70–99)
HDL: 35 mg/dL — ABNORMAL LOW (ref >39)
HIV 1 RNA Quant: 84 copies/mL
LDL Cholesterol: 87 mg/dL (ref 0–99)
Phosphorus: 4.5 mg/dL (ref 2.3–4.6)
Sodium: 140 meq/L (ref 135–145)
Total Bilirubin: 0.4 mg/dL (ref 0.3–1.2)
Total CHOL/HDL Ratio: 4.7
VLDL: 41 mg/dL — ABNORMAL HIGH (ref 0–40)

## 2009-07-22 ENCOUNTER — Telehealth: Payer: Self-pay | Admitting: Infectious Diseases

## 2009-07-22 ENCOUNTER — Telehealth: Payer: Self-pay

## 2009-07-27 ENCOUNTER — Telehealth: Payer: Self-pay | Admitting: Infectious Diseases

## 2009-07-28 ENCOUNTER — Ambulatory Visit: Payer: Self-pay | Admitting: Infectious Diseases

## 2009-07-28 DIAGNOSIS — H6690 Otitis media, unspecified, unspecified ear: Secondary | ICD-10-CM | POA: Insufficient documentation

## 2009-08-07 ENCOUNTER — Telehealth (INDEPENDENT_AMBULATORY_CARE_PROVIDER_SITE_OTHER): Payer: Self-pay | Admitting: *Deleted

## 2009-08-11 ENCOUNTER — Ambulatory Visit: Payer: Self-pay | Admitting: Infectious Diseases

## 2009-08-11 ENCOUNTER — Telehealth: Payer: Self-pay | Admitting: Infectious Diseases

## 2009-08-13 ENCOUNTER — Telehealth (INDEPENDENT_AMBULATORY_CARE_PROVIDER_SITE_OTHER): Payer: Self-pay | Admitting: *Deleted

## 2009-09-03 ENCOUNTER — Ambulatory Visit: Payer: Self-pay | Admitting: Infectious Diseases

## 2009-09-14 ENCOUNTER — Telehealth (INDEPENDENT_AMBULATORY_CARE_PROVIDER_SITE_OTHER): Payer: Self-pay | Admitting: *Deleted

## 2009-10-05 ENCOUNTER — Telehealth: Payer: Self-pay | Admitting: Infectious Diseases

## 2009-10-06 ENCOUNTER — Ambulatory Visit: Payer: Self-pay | Admitting: Infectious Diseases

## 2009-10-06 LAB — CONVERTED CEMR LAB
ALT: 35 units/L (ref 0–53)
AST: 22 units/L (ref 0–37)
Bacteria, UA: NONE SEEN
Bilirubin Urine: NEGATIVE
Bilirubin, Direct: 0.3 mg/dL (ref 0.0–0.3)
Calcium: 8.9 mg/dL (ref 8.4–10.5)
Cholesterol: 161 mg/dL (ref 0–200)
Creatinine, Urine: 195.4 mg/dL
Crystals: NONE SEEN
Ketones, ur: NEGATIVE mg/dL
Phosphorus: 3.9 mg/dL (ref 2.3–4.6)
Potassium: 4.2 meq/L (ref 3.5–5.3)
Protein, ur: NEGATIVE mg/dL
RBC / HPF: NONE SEEN (ref <3)
Sodium: 137 meq/L (ref 135–145)
Specific Gravity, Urine: 1.027 (ref 1.005–1.030)
Total CHOL/HDL Ratio: 3.6
Total Protein: 6.8 g/dL (ref 6.0–8.3)
Triglycerides: 137 mg/dL (ref <150)
Urine Glucose: NEGATIVE mg/dL
Urobilinogen, UA: 0.2 (ref 0.0–1.0)
VLDL: 27 mg/dL (ref 0–40)

## 2009-11-19 ENCOUNTER — Ambulatory Visit: Payer: Self-pay | Admitting: Infectious Disease

## 2009-12-02 ENCOUNTER — Telehealth: Payer: Self-pay | Admitting: Infectious Disease

## 2009-12-04 ENCOUNTER — Telehealth (INDEPENDENT_AMBULATORY_CARE_PROVIDER_SITE_OTHER): Payer: Self-pay | Admitting: *Deleted

## 2009-12-21 ENCOUNTER — Telehealth (INDEPENDENT_AMBULATORY_CARE_PROVIDER_SITE_OTHER): Payer: Self-pay | Admitting: *Deleted

## 2009-12-24 ENCOUNTER — Telehealth: Payer: Self-pay | Admitting: Internal Medicine

## 2010-01-07 ENCOUNTER — Telehealth: Payer: Self-pay | Admitting: Infectious Disease

## 2010-01-25 ENCOUNTER — Encounter: Payer: Self-pay | Admitting: Infectious Diseases

## 2010-01-25 ENCOUNTER — Ambulatory Visit: Payer: Self-pay | Admitting: Infectious Disease

## 2010-01-25 LAB — CONVERTED CEMR LAB
ALT: 41 units/L (ref 0–53)
AST: 27 units/L (ref 0–37)
Alkaline Phosphatase: 95 units/L (ref 39–117)
Bilirubin, Direct: 0.3 mg/dL (ref 0.0–0.3)
CO2: 27 meq/L (ref 19–32)
Calcium: 9.2 mg/dL (ref 8.4–10.5)
Cholesterol: 163 mg/dL (ref 0–200)
Creatinine, Ser: 0.89 mg/dL (ref 0.40–1.50)
Glucose, Bld: 195 mg/dL — ABNORMAL HIGH (ref 70–99)
Indirect Bilirubin: 1.2 mg/dL — ABNORMAL HIGH (ref 0.0–0.9)
Total Bilirubin: 1.5 mg/dL — ABNORMAL HIGH (ref 0.3–1.2)

## 2010-02-02 ENCOUNTER — Telehealth: Payer: Self-pay | Admitting: Infectious Disease

## 2010-03-08 ENCOUNTER — Encounter: Payer: Self-pay | Admitting: Infectious Disease

## 2010-03-10 ENCOUNTER — Ambulatory Visit: Payer: Self-pay | Admitting: Infectious Disease

## 2010-03-12 ENCOUNTER — Encounter: Payer: Self-pay | Admitting: Infectious Disease

## 2010-03-15 ENCOUNTER — Encounter: Payer: Self-pay | Admitting: Infectious Disease

## 2010-03-30 ENCOUNTER — Ambulatory Visit: Payer: Self-pay | Admitting: Infectious Disease

## 2010-03-30 DIAGNOSIS — A630 Anogenital (venereal) warts: Secondary | ICD-10-CM | POA: Insufficient documentation

## 2010-04-20 ENCOUNTER — Ambulatory Visit (HOSPITAL_COMMUNITY): Admission: RE | Admit: 2010-04-20 | Discharge: 2010-04-20 | Payer: Self-pay | Admitting: Infectious Disease

## 2010-05-11 ENCOUNTER — Ambulatory Visit: Payer: Self-pay | Admitting: Infectious Disease

## 2010-05-11 LAB — CONVERTED CEMR LAB
ALT: 41 units/L (ref 0–53)
BUN: 10 mg/dL (ref 6–23)
Bilirubin, Direct: 0.3 mg/dL (ref 0.0–0.3)
CO2: 24 meq/L (ref 19–32)
Chloride: 104 meq/L (ref 96–112)
Cholesterol: 176 mg/dL (ref 0–200)
Glucose, Bld: 124 mg/dL — ABNORMAL HIGH (ref 70–99)
LDL Cholesterol: 109 mg/dL — ABNORMAL HIGH (ref 0–99)
Phosphorus: 3.8 mg/dL (ref 2.3–4.6)
Potassium: 4.5 meq/L (ref 3.5–5.3)
Sodium: 138 meq/L (ref 135–145)
Total Bilirubin: 3.1 mg/dL — ABNORMAL HIGH (ref 0.3–1.2)
Total CHOL/HDL Ratio: 4.8
VLDL: 30 mg/dL (ref 0–40)

## 2010-06-22 ENCOUNTER — Telehealth: Payer: Self-pay | Admitting: Infectious Disease

## 2010-07-03 ENCOUNTER — Encounter: Payer: Self-pay | Admitting: Infectious Disease

## 2010-07-04 ENCOUNTER — Encounter: Payer: Self-pay | Admitting: Emergency Medicine

## 2010-07-11 LAB — CONVERTED CEMR LAB
ALT: 76 units/L — ABNORMAL HIGH (ref 0–53)
AST: 44 units/L — ABNORMAL HIGH (ref 0–37)
Albumin: 3.9 g/dL (ref 3.5–5.2)
Albumin: 4.3 g/dL (ref 3.5–5.2)
Alkaline Phosphatase: 90 units/L (ref 39–117)
Alkaline Phosphatase: 95 units/L (ref 39–117)
Alkaline Phosphatase: 96 units/L (ref 39–117)
BUN: 10 mg/dL (ref 6–23)
BUN: 15 mg/dL (ref 6–23)
BUN: 16 mg/dL (ref 6–23)
Basophils Absolute: 0 10*3/uL (ref 0.0–0.1)
Basophils Relative: 0 % (ref 0–1)
Bilirubin Urine: NEGATIVE
CO2: 25 meq/L (ref 19–32)
CO2: 28 meq/L (ref 19–32)
Calcium: 9 mg/dL (ref 8.4–10.5)
Chlamydia, Swab/Urine, PCR: NEGATIVE
Creatinine, Ser: 0.97 mg/dL (ref 0.40–1.50)
Eosinophils Absolute: 0.1 10*3/uL (ref 0.0–0.7)
GFR calc Af Amer: >60 mL/min (ref >60)
GFR calc Af Amer: >60 mL/min (ref >60)
Glucose, Bld: 102 mg/dL — ABNORMAL HIGH (ref 70–99)
Glucose, Bld: 140 mg/dL — ABNORMAL HIGH (ref 70–99)
HCV Ab: NEGATIVE
HDL: 27 mg/dL — ABNORMAL LOW (ref >39)
HDL: 38 mg/dL — ABNORMAL LOW (ref >39)
Hemoglobin: 16.4 g/dL (ref 13.0–17.0)
Hep B Core Total Ab: NEGATIVE
Hep B S Ab: NEGATIVE
Hepatitis B Surface Ag: NEGATIVE
Indirect Bilirubin: 0.4 mg/dL (ref 0.0–0.9)
Ketones, ur: NEGATIVE mg/dL
LDL Cholesterol: 107 mg/dL — ABNORMAL HIGH (ref 0–99)
LDL Cholesterol: 114 mg/dL — ABNORMAL HIGH (ref 0–99)
MCHC: 34.7 g/dL (ref 30.0–36.0)
MCV: 90.6 fL (ref 78.0–100.0)
Monocytes Absolute: 0.9 10*3/uL — ABNORMAL HIGH (ref 0.2–0.7)
Monocytes Relative: 13 % — ABNORMAL HIGH (ref 3–11)
Neutro Abs: 3.5 10*3/uL (ref 1.7–7.7)
Neutrophils Relative %: 51 % (ref 43–77)
Potassium: 4.4 meq/L (ref 3.5–5.3)
RBC / HPF: NONE SEEN (ref <3)
RBC: 5.22 M/uL (ref 4.22–5.81)
RDW: 13.1 % (ref 11.5–14.0)
Specific Gravity, Urine: 1.031 (ref 1.005–1.03)
Total Bilirubin: 0.5 mg/dL (ref 0.3–1.2)
Total Bilirubin: 0.5 mg/dL (ref 0.3–1.2)
Total Protein: 7.5 g/dL (ref 6.0–8.3)
Urine Glucose: NEGATIVE mg/dL
VLDL: 19 mg/dL (ref 0–40)
VLDL: 26 mg/dL (ref 0–40)
WBC, UA: NONE SEEN cells/hpf (ref <3)
pH: 6 (ref 5.0–8.0)

## 2010-07-14 NOTE — Miscellaneous (Signed)
Summary: HIV-1 RNA, CD4 (RESEARCH)  Clinical Lists Changes  Observations: Added new observation of CD4 COUNT: 798 microliters (05/27/2009 10:04) Added new observation of HIV1RNA QA: 55 copies/mL (05/27/2009 10:04)

## 2010-07-14 NOTE — Progress Notes (Signed)
Summary: [facility]/pt assist med arrived for Feb  Phone Note Refill Request      Prescriptions: NORVIR 100 MG TABS (RITONAVIR) Take 1 tablet by mouth once a day  #30 x 0   Entered by:   Paulo Fruit  BS,CPht II,MPH   Authorized by:   Clydie Braun MD   Signed by:   Paulo Fruit  BS,CPht II,MPH on 07/14/2009   Method used:   Samples Given   RxID:   [id number]   Patient Assist Medication Verification: Medication: Norvir 100mg  JYN#[id number] Exp Date:20 Sep 2010 Tech approval:MLD **Patient will pick up during visit with research.Paulo Fruit  BS,CPht II,MPH  July 14, 2009 9:06 AM

## 2010-07-14 NOTE — Progress Notes (Signed)
Summary: Drug no longer made.  Please change  Phone Note From Pharmacy   Caller: [name] [phone]* Details for Reason: drug no longer made Summary of Call: [name] can you see if you can assist me with this.  Patient was once [name].  No doctor assisgned as of yet.  [name] and Ninetta Lights are on vacation and Orvan Falconer is gone from the clinic for today. Thanks  Received a second fax request from patient's pharmacy in regards to Aerobid inhaler no longer being manufactered by the drug company.  We previously did a prior authorization this medication in which was approved through [place].  Pharmacy is asking to switch to another medication since Aerobid has been discontinued by the company.  Other choices in this class are: Pulmicort, Asmanex, Azmacort, Flovent HFA, Flovent Diskus, and Alvesco.  They would like to know which one you would like to use and they need strength, dosing, quantity, and any refills if any. Initial call taken by: Paulo Fruit  BS,CPht II,MPH,  December 24, 2009 12:20 PM  Follow-up for Phone Call        asmanex 220 two times a day #1 with 2 refills Follow-up by: Yisroel Ramming MD,  December 24, 2009 3:25 PM    New/Updated Medications: ASMANEX 30 METERED DOSES 220 MCG/INH AEPB (MOMETASONE FUROATE) Inhale 1 puffs twice a day Prescriptions: ASMANEX 30 METERED DOSES 220 MCG/INH AEPB (MOMETASONE FUROATE) Inhale 1 puffs twice a day  #1 month x 2   Entered by:   Paulo Fruit  BS,CPht II,MPH   Authorized by:   Yisroel Ramming MD   Signed by:   Paulo Fruit  BS,CPht II,MPH on 12/24/2009   Method used:   Telephoned to ...       [name] (retail)       [address] [place], [place]  [address]       Ph: [phone]       Fax: [phone]   RxID:   [id number]  Paulo Fruit  BS,CPht II,MPH  December 24, 2009 4:42 PM  Appended Document: Drug no longer made.  Please change A prior authorization was required  for this medication as well.  Based on criteria, Medicaid approved patient to obtain this medicaiton via Medicaid for 1 year beginning with today's date 01/07/10 to 01/08/2011.  Pharmacy has been notified.

## 2010-07-14 NOTE — Miscellaneous (Signed)
Summary: HIV-1 RNA, CD4 (RESEARCH)  Clinical Lists Changes  Observations: Added new observation of CD4 COUNT: 1233 microliters (01/25/2010 16:50) Added new observation of HIV1RNA QA: 39 copies/mL (01/25/2010 16:50)

## 2010-07-14 NOTE — Miscellaneous (Signed)
Summary: HIV-1 RNA, CD4 (RESEARCH)  Clinical Lists Changes  Observations: Added new observation of CD4 COUNT: 1317 microliters (10/06/2009 10:39) Added new observation of HIV1RNA QA: 39 copies/mL (10/06/2009 10:39)

## 2010-07-14 NOTE — Assessment & Plan Note (Signed)
Summary: F/U/VS   Primary Provider:  Clydie Braun  CC:  1 month follow up.  History of Present Illness: 40 yo transgender male with HIV and hidradenitis.  He is doing quite well since I last saw her.  Has had visits for boils and a recent ear infection.  cureently has a boil flaring in groin but ears improved.   Last seen 3 weeks ago.  Ears much better.  Currently with boils over buttock.  Preventive Screening-Counseling & Management  Alcohol-Tobacco     Alcohol drinks/day: 0     Smoking Status: current     Smoking Cessation Counseling: yes     Smoke Cessation Stage: precontemplative     Packs/Day: 0.5     Cans of tobacco/week: no     Passive Smoke Exposure: yes  Caffeine-Diet-Exercise     Caffeine use/day: tea     Does Patient Exercise: no     Type of exercise: finished physical therapy     Exercise (avg: min/session): 30-60     Times/week: <3  Safety-Violence-Falls     Seat Belt Use: yes   Updated Prior Medication List: PROVENTIL HFA 108 (90 BASE) MCG/ACT  AERS (ALBUTEROL SULFATE)  METFORMIN HCL 500 MG TABS (METFORMIN HCL) one by mouth once daily ALPRAZOLAM 2 MG TABS (ALPRAZOLAM) one by mouth two times a day as needed for anxiety NORVIR 100 MG TABS (RITONAVIR) Take 1 tablet by mouth once a day REYATAZ 300 MG CAPS (ATAZANAVIR SULFATE) 1 by mouth daily TRUVADA 200-300 MG TABS (EMTRICITABINE-TENOFOVIR) 1 by mouth daily PERCOCET 7.5-325 MG TABS (OXYCODONE-ACETAMINOPHEN) one by mouth q 6 hours as needed for pain OMEPRAZOLE 20 MG CPDR (OMEPRAZOLE) please take NO MORE Than 13m of omeprazole and take it 12 hours before reyataz and norvir dose ALDACTONE 50 MG TABS (SPIRONOLACTONE) take one half tablet daily CLARITIN-D 24 HOUR 10-240 MG XR24H-TAB (LORATADINE-PSEUDOEPHEDRINE) one by mouth once daily TRAZODONE HCL 100 MG TABS (TRAZODONE HCL) one by mouth qhs LEXAPRO 20 MG TABS (ESCITALOPRAM OXALATE) one by mouth once daily  Current Allergies (reviewed today): !  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  Saw a surgeon at that time (1990s) (Dr Lady Gary in [place]).  Had MRI, CT, myelograms, steroids injections, epidurals to control pain and was considered for surgery but pain subsided on its own.   Pain currently worse with walking, spreads down R leg.  Better with crouching and bending over.    Past Surgical History: Last updated: 06/11/2009 No surgeries  Family History: Last updated: 11/26/2007 [place]  Social History: Last updated: 07/28/2009 Broke up with his HV positive partner [name] Trying to get medicaid Current Smoker Alcohol use-no Drug use-yes-  occas marijuana  Risk Factors: Alcohol Use: 0 (09/03/2009) Caffeine Use: tea (09/03/2009) Exercise: no (09/03/2009)  Risk Factors: Smoking Status: current (09/03/2009) Packs/Day: 0.5 (09/03/2009) Cans of tobacco/wk: no (09/03/2009) Passive Smoke Exposure: yes (09/03/2009)  Review of Systems       11 systems reviewed and negative except per HPI   Vital Signs:  Patient profile:   40 year old male Height:      71 inches (180.34 cm) Weight:      278.7 pounds (126.68 kg) BMI:  39.01 Temp:     97.5 degrees F (36.39 degrees C) oral Pulse rate:   73 / minute BP sitting:   121 / 83  (left arm)  Vitals Entered By: Baxter Hire) (September 03, 2009 10:31 AM) CC: 1 month follow up Pain Assessment Patient in pain? yes     Location: lower back Intensity: 8 Type: aching Onset of pain  Constant Nutritional Status BMI of > 30 = obese Nutritional Status Detail appetite is alright per patient  Have you ever been in a relationship where you felt threatened, hurt or afraid?No   Does  patient need assistance? Functional Status Self care Ambulation Normal   Physical Exam  General:  alert.   Head:  normocephalic.   Mouth:  fair dentition.   Neck:  supple.   Lungs:  normal respiratory effort and no intercostal retractions.   Heart:  normal rate and regular rhythm.   Abdomen:  soft and non-tender.   Msk:  normal ROM and no joint tenderness.   Skin:  r groin with an area of induration and hyperpigmenation but no discrete abscess  R axilla with small area of induration as well. Psych:  Oriented X3.     Impression & Recommendations:  Problem # 1:  HIV DISEASE (ICD-042)  Doing well on current regimen of boosted reyataz and truvada.  Follows with Selena Batten for researc His updated medication list for this problem includes:    Amoxicillin 500 Mg Caps (Amoxicillin) .Marland Kitchen... Take 1 tablet by mouth three times a day    Ciprofloxacin Hcl 500 Mg Tabs (Ciprofloxacin hcl) .Marland Kitchen... Take 1 tablet by mouth two times a day  Diagnostics Reviewed:  HIV: HIV positive - not AIDS (06/17/2008)   CD4: 798 (05/27/2009)   WBC: 9.9 (12/31/2008)   PMN (bands): 0 (08/12/2008)   Hgb: 16.2 (12/31/2008)   HCT: 47.9 (12/31/2008)   Platelets: 277 (12/31/2008) HIV genotype: See Comment (09/09/2008)   HIV-1 RNA: 55 (05/27/2009)   HBSAg: NEG (09/23/2008)  Diagnostics Reviewed:  HIV: HIV positive - not AIDS (06/17/2008)   CD4: 1163 (07/14/2009)   WBC: 9.9 (12/31/2008)   PMN (bands): 0 (08/12/2008)   Hgb: 16.2 (12/31/2008)   HCT: 47.9 (12/31/2008)   Platelets: 277 (12/31/2008) HIV genotype: See Comment (09/09/2008)   HIV-1 RNA: 84 (07/14/2009)   HBSAg: NEG (09/23/2008)  Problem # 2:  CELLULITIS AND ABSCESS OF OTHER SPECIFIED SITE [phone]) Need to start suppressive doxy for now.  The following medications were removed from the medication list:    Amoxicillin 500 Mg Caps (Amoxicillin) .Marland Kitchen... Take 1 tablet by mouth three times a day    Ciprofloxacin Hcl 500 Mg Tabs (Ciprofloxacin hcl) .Marland Kitchen... Take 1 tablet by  mouth two times a day His updated medication list for this problem includes:    Percocet 7.5-325 Mg Tabs (Oxycodone-acetaminophen) ..... One by mouth q 6 hours as needed for pain    Doxycycline Hyclate 100 Mg Caps (Doxycycline hyclate) ..... One by mouth two times a day  Problem # 3:  HIDRADENITIS SUPPURATIVA (ICD-705.83)  On aldactone since saw Dr [name] and has noticed perhaps a decrease in the amount.  ALso on metformin.   He is noticing some breast nipple tenderness and attributes it to aldactone but he thinks it has helped and wants to push on. Will restart doxy for another month and then reassess. Follow up in 6 weeks  Problem # 4:  OTITIS MEDIA, BILATERAL (ICD-382.9) Assessment: Improved  The following medications were removed from the  medication list:    Amoxicillin 500 Mg Caps (Amoxicillin) .Marland Kitchen... Take 1 tablet by mouth three times a day    Ciprofloxacin Hcl 500 Mg Tabs (Ciprofloxacin hcl) .Marland Kitchen... Take 1 tablet by mouth two times a day His updated medication list for this problem includes:    Doxycycline Hyclate 100 Mg Caps (Doxycycline hyclate) ..... One by mouth two times a day  Problem # 5:  CHRONIC PAIN SYNDROME (ICD-338.4)  This is from his chronic LBP his hidradenitis and his shoulder pain - was following with sports medicine and had 2 cortisone injections. WIll have her sign a pain contract.  SHe admits to [facility] use due to anorexia but I advised I will not give rx if utox has cocaine or other substances  Medications Added to Medication List This Visit: 1)  Doxycycline Hyclate 100 Mg Caps (Doxycycline hyclate) .... One by mouth two times a day  Patient Instructions: 1)  Folow up 6 weeks 2)  Start doxycycline two times a day  Prescriptions: PERCOCET 7.5-325 MG TABS (OXYCODONE-ACETAMINOPHEN) one by mouth q 6 hours as needed for pain  #120 x 0   Entered and Authorized by:   Clydie Braun MD   Signed by:   Clydie Braun MD on 09/03/2009   Method used:   Print then  Give to Patient   RxID:   [id number] ALPRAZOLAM 2 MG TABS (ALPRAZOLAM) one by mouth two times a day as needed for anxiety  #60 x 3   Entered and Authorized by:   Clydie Braun MD   Signed by:   Clydie Braun MD on 09/03/2009   Method used:   Print then Give to Patient   RxID:   [id number] DOXYCYCLINE HYCLATE 100 MG CAPS (DOXYCYCLINE HYCLATE) one by mouth two times a day  #60 x 3   Entered and Authorized by:   Clydie Braun MD   Signed by:   Clydie Braun MD on 09/03/2009   Method used:   Print then Give to Patient   RxID:   [id number]

## 2010-07-14 NOTE — Progress Notes (Signed)
  Phone Note Call from Patient Call back at Home Phone [phone]   Reason for Call: Acute Illness Summary of Call: Patient called c/o earaches and bad headaches, also had chills last night, ? fever. Recommended she take a decongestant,like  tylenol sinus for signs. She has an appt tomorrow with a physician.  Initial call taken by: Deirdre Evener RN,  July 27, 2009 9:51 AM

## 2010-07-14 NOTE — Medication Information (Signed)
Summary: [place] [employer]: Drug List  [place] [employer]: Drug List   Imported By: Florinda Marker 03/29/2010 15:35:09  _____________________________________________________________________  External Attachment:    Type:   Image     Comment:   External Document

## 2010-07-14 NOTE — Progress Notes (Signed)
Summary: LUQ abd pain   Phone Note Call from Patient   Summary of Call: Pt called c/o LUQ abdominal pain with "fluttering feeling".  Pain is random and not associated with eating or movement and occurs multiple times throughout the day.  Pt is requesting OV wtih Dr Sampson Goon.  Work in appt given on 07-28-08.  Pt could be seen sooner by different physician but refused.   He was advised to go to ED for eval if symptoms worsen prior to appt. Tomasita Morrow RN  July 22, 2009 10:36 AM  Initial call taken by: Tomasita Morrow RN,  July 22, 2009 10:36 AM

## 2010-07-14 NOTE — Assessment & Plan Note (Signed)
Summary: PER [name] F/U [MKJ]   Visit Type:  Follow-up Primary Provider:  Daiva [name]  CC:  f/u and new pain in left lower back.  History of Present Illness: 40 yo transgender male with HIV and hidradenitis.  She  was wheezing on exam but denies taking albuterol very often. She is not on inhaled steroid. The aldosterone seems to ahve helped her hidradenitis but she does have an absess in her groin on the right and one under her right armpit. Otherwise no new complaints.  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 One By Mouth Once Daily 3)  Alprazolam 2 Mg Tabs (Alprazolam) .... One By Mouth Two Times A Day As Needed For Anxiety 4)  Norvir 100 Mg Tabs (Ritonavir) .... Take 1 Tablet By Mouth Once A Day 5)  Reyataz 300 Mg Caps (Atazanavir Sulfate) .Marland Kitchen.. 1 By Mouth Daily 6)  Truvada 200-300 Mg Tabs (Emtricitabine-Tenofovir) .Marland Kitchen.. 1 By  Mouth Daily 7)  Percocet 7.5-325 Mg Tabs (Oxycodone-Acetaminophen) .... One By Mouth Q 6 Hours As Needed For Pain 8)  Omeprazole 20 Mg Cpdr (Omeprazole) .... Please Take No More Than 49m of Omeprazole and Take It 12 Hours Before Reyataz and Norvir Dose 9)  Aldactone 50 Mg Tabs (Spironolactone) .... Take One Half Tablet Daily 10)  Claritin-D 24 Hour 10-240 Mg Xr24h-Tab (Loratadine-Pseudoephedrine) .... One By Mouth Once Daily 11)  Trazodone Hcl 100 Mg Tabs (Trazodone Hcl) .... One By Mouth Qhs 12)  Lexapro 20 Mg Tabs (Escitalopram Oxalate) .... One By Mouth Once Daily 13)  Doxycycline Hyclate 100 Mg Caps (Doxycycline Hyclate) .... One By Mouth Two Times A Day  Current Medications (verified): 1)  Proventil Hfa 108 (90 Base) Mcg/act  Aers (Albuterol Sulfate) 2)  Metformin Hcl 500 Mg Tabs (Metformin Hcl) .... One By Mouth Once Daily 3)  Alprazolam 2 Mg Tabs (Alprazolam) .... One By Mouth Two Times A Day As Needed For Anxiety 4)  Norvir 100 Mg Tabs (Ritonavir) .... Take 1 Tablet By Mouth Once A Day 5)  Reyataz 300 Mg Caps (Atazanavir Sulfate) .Marland Kitchen.. 1 By Mouth Daily 6)  Truvada 200-300 Mg Tabs (Emtricitabine-Tenofovir) .Marland Kitchen.. 1 By Mouth Daily 7)  Percocet 7.5-325 Mg Tabs (Oxycodone-Acetaminophen) .... One By Mouth Q 6 Hours As Needed For Pain 8)  Omeprazole 20 Mg Cpdr (Omeprazole) .... Please Take No More Than 92m of Omeprazole and Take It 12 Hours Before Reyataz and Norvir Dose 9)  Aldactone 50 Mg  Tabs (Spironolactone) .... Take One Half Tablet Daily 10)  Claritin-D 24 Hour 10-240 Mg Xr24h-Tab (Loratadine-Pseudoephedrine) .... One By Mouth Once Daily 11)  Trazodone Hcl 100 Mg Tabs (Trazodone Hcl) .... One By Mouth Qhs 12)  Lexapro 20 Mg Tabs (Escitalopram Oxalate) .... One By Mouth Once Daily 13)  Doxycycline Hyclate 100 Mg Caps (Doxycycline Hyclate) .... One By Mouth Two Times A Day 14)  Flunisolide 0.025 % Soln (Flunisolide) .... Take Two Puffs Two Times A Day  Allergies: 1)  !  Darvocet   Preventive Screening-Counseling & Management  Alcohol-Tobacco     Alcohol drinks/day: 0     Smoking Status: current     Smoking Cessation Counseling: yes     Smoke Cessation Stage: precontemplative     Packs/Day: 0.5     Cans of tobacco/week: no     Passive Smoke Exposure: yes  Caffeine-Diet-Exercise     Caffeine use/day: tea     Does Patient Exercise: no     Type of exercise: finished physical therapy     Exercise (avg: min/session): 30-60     Times/week: <3   Current Allergies (reviewed today): ! 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  Saw a surgeon at that time (1990s) (Dr Lady Gary in [place]).  Had MRI, CT, myelograms, steroids injections, epidurals to control pain and was considered for surgery but pain subsided on its own.   Pain currently worse with walking, spreads down R leg.  Better with crouching and bending over.    Past Surgical History: Last updated: 06/11/2009 No surgeries  Family History: Last updated: 11/26/2007 [place]  Social History: Last updated: 07/28/2009 Broke up with his HV positive partner [name] Trying to get medicaid Current Smoker Alcohol use-no Drug use-yes-  occas marijuana  Risk Factors: Alcohol Use: 0 (11/19/2009) Caffeine Use: tea (11/19/2009) Exercise: no (11/19/2009)  Risk Factors: Smoking Status: current (11/19/2009) Packs/Day: 0.5 (11/19/2009) Cans of tobacco/wk: no (11/19/2009) Passive Smoke Exposure: yes (11/19/2009)  Family History: Reviewed history from 11/26/2007 and no changes required. [place]  Social History: Reviewed history from 07/28/2009 and no changes required. Broke up with his HV positive partner  [name] Trying to get medicaid Current Smoker Alcohol use-no Drug use-yes-  occas marijuana  Review of Systems       The patient complains of suspicious skin lesions.  The patient denies anorexia, fever, weight loss, weight gain, vision loss, decreased hearing, hoarseness, chest pain, syncope, dyspnea on exertion, peripheral edema, prolonged cough, headaches, hemoptysis, abdominal pain, melena, hematochezia, severe indigestion/heartburn, hematuria, incontinence, genital sores, muscle weakness, transient blindness, difficulty walking, depression, unusual weight change, abnormal bleeding, and enlarged lymph nodes.    Vital Signs:  Patient profile:   40 year old male Height:      71.25 inches (180.97 cm) Weight:      286.25 pounds (130.11 kg) BMI:     39.79 Temp:     97.6 degrees F (36.44 degrees C) oral Pulse rate:   77 / minute BP sitting:   130 / 84  (right arm)  Vitals Entered By: Starleen Arms CMA (November 19, 2009 8:56 AM) CC: f/u, new pain in left lower back Is Patient Diabetic? Yes Did you  bring your meter with you today? No Pain Assessment Patient in pain? yes     Location: back Intensity: 8 Type: aching  Does patient need assistance? Functional Status Self care Ambulation Normal        Medication Adherence: 11/19/2009   Adherence to medications reviewed with patient. Counseling to provide adequate adherence provided                                Physical Exam  General:  alert.  well-developed, well-nourished, and overweight-appearing.   Head:  normocephalic, atraumatic, and no abnormalities observed.   Eyes:  vision grossly intact, pupils equal, pupils round, and pupils reactive to light.   Ears:  no external deformities and ear piercing(s) noted.   Nose:  no external deformity and no external erythema.   Mouth:  fair dentition.  pharynx pink and moist, no erythema, and no exudates.   Neck:  supple.   Lungs:  diffuse expiratory wheezes Heart:   normal rate and regular rhythm.  no murmur and no gallop.   Abdomen:  soft and non-tender.   Msk:  normal ROM and no joint tenderness.   Extremities:  no cce Neurologic:  alert & oriented X3 astrength normal in all extremities and gait normal.   Skin:  r groin with an area of induration and hyperpigmenation tender. She claims it is going down R axilla with small area of induration as well. and multiple scars Psych:  Oriented X3.  memory intact for recent and remote and normally interactive.     Impression & Recommendations:  Problem # 1:  HIV DISEASE (ICD-042)  Excellent control! continue to get labs viaACTG. Does need RPR and gc, chlamydia screening and hep a vax #2 given His updated medication list for this problem includes:    Doxycycline Hyclate 100 Mg Caps (Doxycycline hyclate) ..... One by mouth two times a day  Diagnostics Reviewed:  HIV: HIV positive - not AIDS (06/17/2008)   CD4: 1317 (10/06/2009)   WBC: 9.9 (12/31/2008)   PMN (bands): 0 (08/12/2008)   Hgb: 16.2 (12/31/2008)   HCT: 47.9 (12/31/2008)   Platelets: 277 (12/31/2008) HIV genotype: See Comment (09/09/2008)   HIV-1 RNA: 39 (10/06/2009)   HBSAg: NEG (09/23/2008)  Orders: Est. Patient Level IV (16109)  Problem # 2:  HIDRADENITIS SUPPURATIVA (ICD-705.83)  Overall improved but with groin and axillary involvement at present. If these enlarge we will do I and D and give her antibiotics. She is to use compresses for now. Continue aldactone, topical care  Orders: Est. Patient Level IV (60454)  Problem # 3:  CELLULITIS AND ABSCESS OF OTHER SPECIFIED SITE (ICD-682.8) see hidradentiits  His updated medication list for this problem includes:    Percocet 7.5-325 Mg Tabs (Oxycodone-acetaminophen) ..... One by mouth q 6 hours as needed for pain    Doxycycline Hyclate 100 Mg Caps (Doxycycline hyclate) ..... One by mouth two times a day  Problem # 4:  ASTHMA, UNSPECIFIED, UNSPECIFIED STATUS (ICD-493.90)  I wrote her a rx for  aerobid two puffs two times a day. She SHOULD NOT BE ON FLUTICASONE DUE TO CONCERN FOR NORVIR INTERACATION THAT CAN ELEVATE SYSTEMIC LEVELS OF FLUTICASONE AND CAUSE ADRENAL SUPPRESSION. Continue albuterol His updated medication list for this problem includes:    Proventil Hfa 108 (90 Base) Mcg/act Aers (Albuterol sulfate)  Orders: Est. Patient Level IV (09811)  Problem # 5:  DEPRESSION (ICD-311)  stable His updated  medication list for this problem includes:    Alprazolam 2 Mg Tabs (Alprazolam) ..... One by mouth two times a day as needed for anxiety    Trazodone Hcl 100 Mg Tabs (Trazodone hcl) ..... One by mouth qhs    Lexapro 20 Mg Tabs (Escitalopram oxalate) ..... One by mouth once daily  Orders: Est. Patient Level IV (21308)  Medications Added to Medication List This Visit: 1)  Flunisolide 0.025 % Soln (Flunisolide) .... take two puffs two times a day  Other Orders: T-GC Probe, urine [phone]) T-RPR (Syphilis) (52841-32440)  Patient Instructions: 1)  rtc in 3-4 months    Hepatitis A Vaccine # 2 (to be given today)

## 2010-07-14 NOTE — Progress Notes (Signed)
Summary: Prior authorization approved for Aerobid  Phone Note From Pharmacy   Caller: [facility] [employer]. [phone]* Request: Needs authorization from insurer Summary of Call: Received a fax prior authorization request for patient's Aerobid inhaler.  It is on the non preferred list fo medications under Medicaid.  Patient must try and fail Budesonide and or Qvar before Medicaid will pay.  Please have MD or representative to call Medicaid at [phone] for an override.  Please notifiy Samantha Cr[address] with the status of the claim. Initial call taken by: Paulo Fruit  BS,CPht II,MPH,  December 21, 2009 2:12 PM  Follow-up for Phone Call        I spoke to [place], the [facility] representative, who approved patient's claim based on the criteria and documentation in patient's chart for one year beginning with today's date 12/21/09 to 12/22/09.  Pharmacy has been notified of the approval. Follow-up by: Paulo Fruit  BS,CPht II,MPH,  December 21, 2009 2:23 PM

## 2010-07-14 NOTE — Medication Information (Signed)
Summary: [facility]gene HIV-1: RX  T[facility] HIV-1: RX   Imported By: Florinda Marker 07/07/2009 15:03:39  _____________________________________________________________________  External Attachment:    Type:   Image     Comment:   External Document

## 2010-07-14 NOTE — Miscellaneous (Signed)
Summary: Medication Contract  Medication Contract   Imported By: Florinda Marker 08/13/2009 16:59:11  _____________________________________________________________________  External Attachment:    Type:   Image     Comment:   External Document

## 2010-07-14 NOTE — Progress Notes (Signed)
Summary: Problems  Phone Note Call from Patient   Caller: [phone] Summary of Call: Voicemail 07-22-09 Please call having internal issues.  Return call on 07-23-09  Voice Mail: left message to call back with more detailed info.  Tomasita Morrow RN  July 22, 2009 9:40 AM

## 2010-07-14 NOTE — Progress Notes (Signed)
Summary: pt. assist med arrived via [facility] mail for Mar  Phone Note Refill Request      Prescriptions: NORVIR 100 MG TABS (RITONAVIR) Take 1 tablet by mouth once a day  #30 x 0   Entered by:   Paulo Fruit  BS,CPht II,MPH   Authorized by:   Clydie Braun MD   Signed by:   Paulo Fruit  BS,CPht II,MPH on 08/13/2009   Method used:   Samples Given   RxID:   [id number]   Patient Assist Medication Verification: Medication: Norvir 100mg  JYN#[id number] Exp Date:14 Oct 2010 Tech approval:MLD Call placed to patient with message that assistance medications are ready for pick-up. Paulo Fruit  BS,CPht II,MPH  August 13, 2009 8:24 AM                  Appended Document: pt. assist med arrived via [facility] mail for Mar Prescription/Samples picked up by: patient
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
## 2010-07-14 NOTE — Progress Notes (Signed)
Summary: Alanson Aly and percocet  Phone Note Refill Request Call back at Home Phone [phone]   Refills Requested: Medication #1:  ALPRAZOLAM 2 MG TABS one by mouth two times a day as needed for anxiety   Last Refilled: 07/08/2009  Medication #2:  PERCOCET 7.5-325 MG TABS one by mouth q 6 hours as needed for pain   Last Refilled: 07/08/2009  Method Requested: Pick up at Office Initial call taken by: Jennet Maduro RN,  August 07, 2009 12:35 PM Caller: Patient Reason for Call: Refill Medication    Prescriptions: PERCOCET 7.5-325 MG TABS (OXYCODONE-ACETAMINOPHEN) one by mouth q 6 hours as needed for pain  #120 x 0   Entered by:   Jennet Maduro RN   Authorized by:   Johny Sax MD   Signed by:   Jennet Maduro RN on 08/07/2009   Method used:   Print then Give to Patient   RxID:   [id number] ALPRAZOLAM 2 MG TABS (ALPRAZOLAM) one by mouth two times a day as needed for anxiety  #60 x 0   Entered by:   Jennet Maduro RN   Authorized by:   Johny Sax MD   Signed by:   Jennet Maduro RN on 08/07/2009   Method used:   Print then Give to Patient   RxID:   [id number]  Placed in MD box for signature. Jennet Maduro RN  August 07, 2009 12:37 PM  Appended Document: Alanson Aly and percocet Script destroyed.  Witnessed by Jennet Maduro, RN Script was awaiting pt to pick up and Dr Sampson Goon wrote new script at [facility] on 08-11-09. Laurell Josephs, RN

## 2010-07-14 NOTE — Progress Notes (Signed)
Summary: requesting percocet rf  Phone Note Call from Patient   Summary of Call: Patient called requesting rf of percocet and to pick them up on Friday. Is this ok? Initial call taken by: Starleen Arms CMA,  February 02, 2010 10:40 AM  Follow-up for Phone Call        NO problem Follow-up by: Paulette Blanch Dam MD,  February 02, 2010 11:11 AM    Prescriptions: PERCOCET 7.5-325 MG TABS (OXYCODONE-ACETAMINOPHEN) one by mouth q 6 hours as needed for pain  #120 x 0   Entered and Authorized by:   Acey Lav MD   Signed by:   Paulette Blanch Dam MD on 02/02/2010   Method used:   Print then Give to Patient   RxID:   [id number] PERCOCET 7.5-325 MG TABS (OXYCODONE-ACETAMINOPHEN) one by mouth q 6 hours as needed for pain  #120 x 0   Entered and Authorized by:   Acey Lav MD   Signed by:   Paulette Blanch Dam MD on 02/02/2010   Method used:   Print then Give to Patient   RxID:   [id number]

## 2010-07-14 NOTE — Assessment & Plan Note (Signed)
Summary: STUDY APPT/ LH    Current Allergies: ! DARVOCET Vital Signs:  Patient profile:   40 year old male Height:      71.25 inches (180.97 cm) Weight:      280.75 pounds (127.61 kg) BMI:     39.02 Temp:     98.3 degrees F oral Pulse rate:   80 / minute Resp:     16 per minute BP sitting:   116 / 89  (left arm) Is Patient Diabetic? No Pain Assessment Patient in pain? no      Nutritional Status BMI of > 30 = obese  Does patient need assistance? Functional Status Self care Ambulation Normal   Patient here for week 48 study visit. Denies any new problems or concerns. Still has occassional lesion that drains, currently has one on her buttocks.Deirdre Evener RN  October 06, 2009 9:49 AM    Other Orders: Est. Patient Research Study [id number]) T-Basic Metabolic Panel [id number]) T-Lipid Profile [id number]) T-Hepatic Function [phone]) T-Phosphorus [phone]) T-Urinalysis [id number]) T-Urine Creatinine [id number]) T-Urine Protein [id number])  Process Orders Check Orders Results:     [facility]: ABN not required for this insurance Order queued for requisitioning for [facility]: October 06, 2009 8:51 AM  Tests Sent for requisitioning (October 06, 2009 8:51 AM):     10/06/2009: [facility] -- T-Basic Metabolic Panel [id number] (signed)     10/06/2009: [facility] -- T-Lipid Profile [phone] (signed)     10/06/2009: [facility] -- T-Hepatic Function [id number] (signed)     10/06/2009: [facility] -- T-Phosphorus [[id number]] (signed)     10/06/2009: [facility] -- T-Urinalysis [[id number]] (signed)     10/06/2009: [facility] -- T-Urine Creatinine [[id number]] (signed)     10/06/2009: [facility] -- T-Urine Protein [phone] (signed)

## 2010-07-14 NOTE — Progress Notes (Signed)
Summary: Pt assist med arrived via [facility] for April  Phone Note Refill Request      Prescriptions: NORVIR 100 MG TABS (RITONAVIR) Take 1 tablet by mouth once a day  #30 x 0   Entered by:   Paulo Fruit  BS,CPht II,MPH   Authorized by:   Clydie Braun MD   Signed by:   Paulo Fruit  BS,CPht II,MPH on 09/14/2009   Method used:   Samples Given   RxID:   [id number]   Patient Assist Medication Verification: Medication: Norvir 100mg  Lot# [id number] Exp Date:12 Feb 2011 Tech approval:MLD Tried to contact patient.  Could not leave a message at the time of calling because mailbox was full Paulo Fruit  BS,CPht II,MPH  September 14, 2009 4:32 PM

## 2010-07-14 NOTE — Assessment & Plan Note (Signed)
Summary: F/U/OV/VS   Visit Type:  Follow-up Primary [place]:  [name]  CC:  patient having vomiting when taking norvir, right shoulder pain causes numbness in index finger, mrsa boil on top of buttocks, and Depression.  History of Present Illness: 40 yo transgender male with HIV and hidradenitis.She has multiple problems today:  #1 she had vomiting with gel cap form of norvir that she was taking but not refridgerating  #2 depression worse. No passive or active suicidal ideation. But feels down most of the time  #3 shoulder pain returned with radiation down the right arm with numbness in fingers  #5 boils recurred buttocks and right axilla painful not draining. I spent greater than an hour with this pt including greater than 50% of time with face to face counselling of the pt  Depression History:      The patient is having a depressed mood most of the day and has a diminished interest in his usual daily activities.        Suicide risk questions reveal that he does not feel like life is worth living and he has thought about ending his life.  The patient denies that he wishes that he were dead and denies that he has planned how to end his life.        Preventive Screening-Counseling & Management  Alcohol-Tobacco     Alcohol drinks/day: 0     Smoking Status: current     Smoking Cessation Counseling: yes     Smoke Cessation Stage: precontemplative     Packs/Day: 0.5     Cans of tobacco/week: no     Passive Smoke Exposure: yes   Current Allergies (reviewed today): ! 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  Saw a surgeon at that time (1990s) (Dr Lady Gary in [facility] [facility]).  Had MRI, CT,  myelograms, steroids injections, epidurals to control pain and was considered for surgery but pain subsided on its own.   Pain currently worse with walking, spreads down R leg.  Better with crouching and bending over.    Past Surgical History: Last updated: 06/11/2009 No surgeries  Family History: Last updated: 11/26/2007 [place]  Social History: Last updated: 07/28/2009 Broke up with his HV positive partner [name] Trying to get medicaid Current Smoker Alcohol use-no Drug use-yes-  occas marijuana  Risk Factors: Alcohol Use: 0 (03/10/2010) Caffeine Use: tea (11/19/2009) Exercise: no (11/19/2009)  Risk Factors: Smoking Status: current (03/10/2010) Packs/Day: 0.5 (03/10/2010) Cans of tobacco/wk: no (03/10/2010) Passive Smoke Exposure: yes (03/10/2010)  Problems Prior to Update: 1)  Otitis Media, Bilateral  (ICD-382.9) 2)  Chronic Pain Syndrome  (ICD-338.4) 3)  Arthritis, Acromioclavicular  (ICD-716.81) 4)  Shoulder Pain, Chronic  (ICD-719.41) 5)  Rotator Cuff Injury, Right Shoulder  (ICD-726.10) 6)  Anxiety State, Unspecified  (ICD-300.00) 7)  Hypokalemia  (ICD-276.8) 8)  Swelling Mass or Lump in Head and Neck  (ICD-784.2) 9)  Transaminases, Serum, Elevated  (ICD-790.4) 10)  Chest Pain  (ICD-786.50) 11)  Diabetes Mellitus, Borderline  (ICD-790.29) 12)  Asthma, Unspecified, Unspecified Status  (ICD-493.90) 13)  Lumbago  (ICD-724.2) 14)  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 One By Mouth Once  Daily 3)  Alprazolam 2 Mg Tabs (Alprazolam) .... One By Mouth Two Times A Day As Needed For Anxiety 4)  Norvir 100 Mg Tabs (Ritonavir) .... Take 1 Tablet By Mouth Once A Day 5)  Reyataz 300 Mg Caps (Atazanavir Sulfate) .Marland Kitchen.. 1 By Mouth Daily 6)  Truvada 200-300 Mg Tabs (Emtricitabine-Tenofovir) .Marland Kitchen.. 1 By Mouth Daily 7)  Percocet 7.5-325 Mg Tabs (Oxycodone-Acetaminophen) .... One By Mouth Q 6 Hours As Needed For Pain 8)  Omeprazole 20 Mg Cpdr (Omeprazole) .... Please Take No More Than 40m of Omeprazole and Take It 12 Hours Before Reyataz and Norvir Dose 9)  Aldactone 50 Mg Tabs (Spironolactone) .... Take One Half Tablet Daily 10)  Claritin-D 24 Hour 10-240 Mg Xr24h-Tab (Loratadine-Pseudoephedrine) .... One By Mouth Once Daily 11)  Trazodone Hcl 100 Mg Tabs (Trazodone Hcl) .... One By Mouth Qhs 12)  Lexapro 20 Mg Tabs (Escitalopram Oxalate) .... One By Mouth Once Daily 13)  Doxycycline Hyclate 100 Mg Caps (Doxycycline Hyclate) .... One By Mouth Two Times A Day 14)  Asmanex 30 Metered Doses 220 Mcg/inh Aepb (Mometasone Furoate) .... Inhale 1 Puffs Twice A Day  Current Medications (verified): 1)  Proventil Hfa 108 (90 Base) Mcg/act  Aers (Albuterol Sulfate) 2)  Metformin Hcl 500 Mg Tabs (Metformin Hcl) .... One By Mouth Once Daily 3)  Alprazolam 2 Mg Tabs (Alprazolam) .... One By Mouth Two Times A Day As Needed For Anxiety 4)  Norvir 100 Mg Tabs (Ritonavir) .... Take 1 Tablet By Mouth Once A Day 5)  Reyataz 300 Mg Caps (Atazanavir Sulfate) .Marland Kitchen.. 1 By Mouth Daily 6)  Truvada 200-300 Mg Tabs (Emtricitabine-Tenofovir) .Marland Kitchen.. 1 By Mouth Daily 7)  Percocet 7.5-325 Mg Tabs (Oxycodone-Acetaminophen) .... One By Mouth Q 6 Hours As Needed For Pain 8)  Omeprazole 20 Mg Cpdr (Omeprazole) .... Please Take No More Than 58m of Omeprazole and Take It 12 Hours Before Reyataz and Norvir Dose 9)  Aldactone 50 Mg Tabs (Spironolactone) .... Take One Half Tablet Daily 10)  Claritin-D 24 Hour 10-240 Mg Xr24h-Tab  (Loratadine-Pseudoephedrine) .... One By Mouth Once Daily 11)  Trazodone Hcl 100 Mg Tabs (Trazodone Hcl) .... One By Mouth Qhs 12)  Lexapro 20 Mg Tabs (Escitalopram Oxalate) .... One By Mouth Once Daily 13)  Doxycycline Hyclate 100 Mg Caps (Doxycycline Hyclate) .... One By Mouth Two Times A Day 14)  Asmanex 30 Metered Doses 220 Mcg/inh Aepb (Mometasone Furoate) .... Inhale 1 Puffs Twice A Day 15)  Oxycontin 10 Mg Xr12h-Tab (Oxycodone Hcl) .... Take 1 Tablet By Mouth Two Times A Day  Allergies: 1)  ! Darvocet   Review of Systems       The patient complains of suspicious skin lesions and depression.  The patient denies anorexia, fever, weight loss, weight gain, vision loss, decreased hearing, hoarseness, chest pain, syncope, dyspnea on exertion, peripheral edema, prolonged cough, headaches, hemoptysis, abdominal pain, melena, hematochezia, severe indigestion/heartburn, hematuria, incontinence, genital sores, muscle weakness, transient blindness, difficulty walking, unusual weight change, abnormal bleeding, and enlarged lymph nodes.    Vital Signs:  Patient profile:   40 year old male Height:      71.25 inches (180.97 cm) Weight:      283 pounds (128.64 kg) BMI:     39.34 Temp:     97.7 degrees F (36.50 degrees C) oral Pulse rate:   78 / minute BP sitting:   138 / 79  (left arm)  Vitals Entered By: Starleen Arms CMA (March 10, 2010 9:16 AM) CC: patient having vomiting when taking norvir, right shoulder pain causes numbness in index finger, mrsa boil on top of buttocks, Depression Is Patient Diabetic? Yes Did you bring your meter with you today? No Pain Assessment Patient in pain? yes     Location: various places Intensity: 8 Type: aching Nutritional Status BMI of > 30 = obese Nutritional Status Detail nl  Does patient need assistance? Functional Status Self care Ambulation Normal        Medication Adherence: 03/10/2010   Adherence to medications reviewed with  patient. Counseling to provide adequate adherence provided                                Physical Exam  General:  alert.  well-developed, well-nourished, and overweight-appearing.   Head:  normocephalic, atraumatic, and no abnormalities observed.   Eyes:  vision grossly intact, pupils equal, pupils round, and pupils reactive to light.   Ears:  no external deformities and ear piercing(s) noted.   Nose:  no external deformity and no external erythema.   Mouth:  fair dentition.  pharynx pink and moist, no erythema, and no exudates.   Neck:  supple.   Lungs:  diffuse expiratory wheezes Heart:  normal rate and regular rhythm.  no murmur and no gallop.   Abdomen:  soft and non-tender.   Rectal:  perirectal furuncle in crease of buttocks Msk:  pain in right shoulder radiates down right arm to ulnar nerve distributoin Extremities:  no cce Neurologic:  alert & oriented X3 astrength normal in all extremities and gait normal.   Skin:  tender fluctuant boil in crease of buttocks tender larger boil in right axilla Psych:  Oriented X3.  memory intact for recent and remote and normally interactive.  dysphoric affect   Impression & Recommendations:  Problem # 1:  HIV DISEASE (ICD-042)  Have him resume his ARVs with tablet norvir, take antiemetic if needed, will write for zofran as well His updated medication list for this problem includes:    Doxycycline Hyclate 100 Mg Caps (Doxycycline hyclate) ..... One by mouth two times a day  Diagnostics Reviewed:  HIV: HIV positive - not AIDS (06/17/2008)   CD4: 1233 (01/25/2010)   WBC: 9.9 (12/31/2008)   PMN (bands): 0 (08/12/2008)   Hgb: 16.2 (12/31/2008)   HCT: 47.9 (12/31/2008)   Platelets: 277 (12/31/2008) HIV genotype: See Comment (09/09/2008)   HIV-1 RNA: 39 (01/25/2010)   HBSAg: NEG (09/23/2008)  Orders: Est. Patient Level V (21308)  Problem # 2:  ARTHRITIS, ACROMIOCLAVICULAR (ICD-716.81)  now with numbness in ular nerve  distribution, will get mri  Orders: Est. Patient Level V (65784)  Problem # 3:  HIDRADENITIS SUPPURATIVA (ICD-705.83) I performed two I and D of buttocks absesses and sent for culture Orders: T-Culture, Wound ([id number]) T-Culture, Wound ([id number]) Est. Patient Level V (69629) I&D Abscess, Simple / Single (10060) I&D Abscess, Simple / Single (10060)  Problem # 4:  DEPRESSION (ICD-311)  cotninue ssri, offered cousneling by Child psychotherapist vs  referral he refuse dfor now His updated medication list for this problem includes:    Alprazolam 2 Mg Tabs (Alprazolam) ..... One by mouth two times a day as needed for anxiety    Trazodone Hcl 100 Mg Tabs (Trazodone hcl) ..... One by mouth qhs    Lexapro 20 Mg Tabs (Escitalopram oxalate) ..... One by mouth once daily  Orders: Est. Patient Level V (54098)  Medications Added to Medication List This Visit: 1)  Oxycontin 10 Mg Xr12h-tab (Oxycodone hcl) .... Take 1 tablet by mouth two times a day 2)  Zofran 4 Mg Tabs (Ondansetron hcl) .... Take 1 tablet by mouth four times a day as needed nausea  Other Orders: T-RPR (Syphilis) [phone]) T-GC Probe, urine [phone]) T-Chlamydia  Probe, urine [phone]) MRI with Contrast (MRI w/Contrast)  Patient Instructions: 1)  [facility] will arrange for MRI of your right shoulder 2)  RTC to see Dr. Daiva [name] in 3 months time    Hepatitis A Vaccine # 2 (to be given today) Prescriptions: ZOFRAN 4 MG TABS (ONDANSETRON HCL) Take 1 tablet by mouth four times a day as needed nausea  #60 x 4   Entered and Authorized by:   Acey Lav MD   Signed by:   Paulette Blanch Dam MD on 03/10/2010   Method used:   Print then Give to Patient   RxID:   [id number] OXYCONTIN 10 MG XR12H-TAB (OXYCODONE HCL) Take 1 tablet by mouth two times a day  #60 x 0   Entered and Authorized by:   Acey Lav MD   Signed by:   Paulette Blanch Dam MD on 03/10/2010   Method used:   Print then Give to  Patient   RxID:   [id number] OXYCONTIN 10 MG XR12H-TAB (OXYCODONE HCL) Take 1 tablet by mouth two times a day  #60 x 0   Entered and Authorized by:   Acey Lav MD   Signed by:   Paulette Blanch Dam MD on 03/10/2010   Method used:   Print then Give to Patient   RxID:   [id number]    procedure: Incision and drainage permission: informed consent obtainedi Area prepped and draped in usual sterile manner. 1 % lidocaine used to infiltrate abscesses. I and D performed x 2. First site with bloody material. Second more medial one with purulent material. Pus milked from sites and sent for culture EBL minimum complication none

## 2010-07-14 NOTE — Assessment & Plan Note (Signed)
Summary: est-ck/fu/meds/cfb   Primary Provider:  Clydie Braun   History of Present Illness: 40 yo transgender male with HIV and hidradenitis.  He is doing quite well since I last saw her.  Has had visits for boils and a recent ear infection.  cureently has a boil flaring in groin but ears improved.   Currently he is making clothes and selling them.  he gets SSI and has medicaid.  He is living with some friends but is looking to get his own place.     Updated Prior Medication List: PROVENTIL HFA 108 (90 BASE) MCG/ACT  AERS (ALBUTEROL SULFATE)  METFORMIN HCL 500 MG TABS (METFORMIN HCL) one by mouth once daily ALPRAZOLAM 2 MG TABS (ALPRAZOLAM) one by mouth two times a day as needed for anxiety NORVIR 100 MG TABS (RITONAVIR) Take 1 tablet by mouth once a day REYATAZ 300 MG CAPS (ATAZANAVIR SULFATE) 1 by mouth daily TRUVADA 200-300 MG TABS (EMTRICITABINE-TENOFOVIR) 1 by mouth daily PERCOCET 7.5-325 MG TABS (OXYCODONE-ACETAMINOPHEN) one by mouth q 6 hours as needed for pain OMEPRAZOLE 20 MG CPDR (OMEPRAZOLE) please take NO MORE Than 7m of omeprazole and take it 12 hours before reyataz and norvir dose ALDACTONE 50 MG TABS (SPIRONOLACTONE) take one half tablet daily AMOXICILLIN 500 MG CAPS (AMOXICILLIN) Take 1 tablet by mouth three times a day CIPROFLOXACIN HCL 500 MG TABS (CIPROFLOXACIN HCL) Take 1 tablet by mouth two times a day  Current Allergies: ! 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  Saw a surgeon at that time (1990s) (Dr Lady Gary in [place]).  Had MRI, CT, myelograms, steroids injections, epidurals to control pain and was considered for surgery but pain subsided on its own.    Pain currently worse with walking, spreads down R leg.  Better with crouching and bending over.    Past Surgical History: Last updated: 06/11/2009 No surgeries  Family History: Last updated: 11/26/2007 [place]  Social History: Last updated: 07/28/2009 Broke up with his HV positive partner [name] Trying to get medicaid Current Smoker Alcohol use-no Drug use-yes-  occas marijuana  Risk Factors: Alcohol Use: 0 (07/28/2009) Caffeine Use: 0 (07/28/2009) Exercise: no (07/28/2009)  Risk Factors: Smoking Status: current (07/28/2009) Packs/Day: 0.5 (07/28/2009) Cans of tobacco/wk: no (07/28/2009) Passive Smoke Exposure: yes (07/28/2009)  Review of Systems       11 systems reviewed and negative except per HPI   Physical Exam  General:  alert and well-developed.   Head:  normocephalic and atraumatic.   Eyes:  vision grossly intact and pupils equal.   Mouth:  good dentition.   Neck:  supple.   Lungs:  normal respiratory effort and normal breath sounds.   Abdomen:  soft and non-tender.   Msk:  normal ROM and no joint tenderness.   Neurologic:  alert & oriented X3 and cranial nerves II-XII intact.   Skin:  r groin with an area of induration and hyperpigmenation but no discrete abscess  R axilla with small area of induration as well. Psych:  Oriented X3 and memory intact for recent and remote.     Impression & Recommendations:  Problem # 1:  HIV DISEASE (ICD-042)  Doing well on current regimen of boosted reyataz and  truvada.  Follows with Selena Batten for researc His updated medication list for this problem includes:    Amoxicillin 500 Mg Caps (Amoxicillin) .Marland Kitchen... Take 1 tablet by mouth three times a day    Ciprofloxacin Hcl 500 Mg Tabs (Ciprofloxacin hcl) .Marland Kitchen... Take 1 tablet by mouth two times a day  Diagnostics Reviewed:  HIV: HIV positive - not AIDS (06/17/2008)   CD4: 798 (05/27/2009)   WBC: 9.9 (12/31/2008)   PMN (bands): 0 (08/12/2008)   Hgb: 16.2 (12/31/2008)   HCT: 47.9  (12/31/2008)   Platelets: 277 (12/31/2008) HIV genotype: See Comment (09/09/2008)   HIV-1 RNA: 55 (05/27/2009)   HBSAg: NEG (09/23/2008)  Orders: Est. Patient Level IV (81191) T-Drug Screen-Urine, (single) ([id number])  Problem # 2:  HIDRADENITIS SUPPURATIVA (ICD-705.83) On aldactone since saw Dr [name] and has noticed perhaps a decrease in the amount.  ALso on metformin.  Also on clindamycin cream.  Will restart doxy for another month and then reassess. Follow up in one month.  Problem # 3:  CHRONIC PAIN SYNDROME (ICD-338.4) This is from his chronic LBP his hidradenitis and his shoulder pain - was following with sports medicine and had 2 cortisone injections. WIll have her sign a pain contract.  SHe admits to [facility] use due to anorexia but I advised I will not give rx if utox has cocaine or other substances  Problem # 4:  OTITIS MEDIA, BILATERAL (ICD-382.9)  Still some fluid behind ear.  Has finsihed amox and cipro.  WIll give decongestant to help open eustachian tubes..  His updated medication list for this problem includes:    Amoxicillin 500 Mg Caps (Amoxicillin) .Marland Kitchen... Take 1 tablet by mouth three times a day    Ciprofloxacin Hcl 500 Mg Tabs (Ciprofloxacin hcl) .Marland Kitchen... Take 1 tablet by mouth two times a day  Orders: Est. Patient Level IV (08657)  Problem # 5:  ARTHRITIS, ACROMIOCLAVICULAR (ICD-716.81) has followed with sports med but has some residual limited rom.  Problem # 6:  ANXIETY STATE, UNSPECIFIED (ICD-300.00) He was followed at [facility] for this and was on trazodone and lexapro but he has run out of the trazodone and lexapro.  The xanax is workign well.  I had a long discussion with him that I am a little hesitant to continually prescribe the xanax and that the doctor who assumes his care when i leave may not want to continue it without psych help.  However he says that the people at [facility] do not know him and they do not spend any time working with him so he is hesitant to return.    At this point I think his mood and social situation are such that the antidepressans and benzo are working . I will not rock the boat and I actually feel ok that he is not abusig or selling the xanax   WIll continue to prescribe reguarally   The following medications were removed from the medication list:    Trazodone Hcl 100 Mg Tabs (Trazodone hcl) .Marland Kitchen... Take 1 tablet by mouth at bedtime    Lexapro 20 Mg Tabs (Escitalopram oxalate) ..... Once daily His updated medication list for this problem includes:    Alprazolam 2 Mg Tabs (Alprazolam) ..... One by mouth two times a day as needed for anxiety    Trazodone Hcl 100 Mg Tabs (Trazodone hcl) ..... One by mouth qhs    Lexapro 20 Mg Tabs (Escitalopram oxalate) ..... One by mouth once daily  Medications Added to Medication List This Visit:  1)  Claritin-d 24 Hour 10-240 Mg Xr24h-tab (Loratadine-pseudoephedrine) .... One by mouth once daily 2)  Trazodone Hcl 100 Mg Tabs (Trazodone hcl) .... One by mouth qhs 3)  Lexapro 20 Mg Tabs (Escitalopram oxalate) .... One by mouth once daily  Patient Instructions: 1)  Please schedule a follow-up appointment in 1 month. Prescriptions: ALPRAZOLAM 2 MG TABS (ALPRAZOLAM) one by mouth two times a day as needed for anxiety  #60 x 0   Entered and Authorized by:   Clydie Braun MD   Signed by:   Clydie Braun MD on 08/11/2009   Method used:   Print then Give to Patient   RxID:   [id number] PERCOCET 7.5-325 MG TABS (OXYCODONE-ACETAMINOPHEN) one by mouth q 6 hours as needed for pain  #120 x 0   Entered and Authorized by:   Clydie Braun MD   Signed by:   Clydie Braun MD on 08/11/2009   Method used:   Print then Give to Patient   RxID:   [id number] LEXAPRO 20 MG TABS (ESCITALOPRAM OXALATE) one by mouth once daily  #30 x 6   Entered and Authorized by:   Clydie Braun MD   Signed by:   Clydie Braun MD on 08/11/2009   Method used:   Print then Give to Patient   RxID:    [id number] TRAZODONE HCL 100 MG TABS (TRAZODONE HCL) one by mouth qhs  #30 x 6   Entered and Authorized by:   Clydie Braun MD   Signed by:   Clydie Braun MD on 08/11/2009   Method used:   Print then Give to Patient   RxID:   [id number] ALDACTONE 50 MG TABS (SPIRONOLACTONE) take one half tablet daily  #30 x 6   Entered and Authorized by:   Clydie Braun MD   Signed by:   Clydie Braun MD on 08/11/2009   Method used:   Print then Give to Patient   RxID:   [id number] METFORMIN HCL 500 MG TABS (METFORMIN HCL) one by mouth once daily  #30 x 6   Entered and Authorized by:   Clydie Braun MD   Signed by:   Clydie Braun MD on 08/11/2009   Method used:   Print then Give to Patient   RxID:   [id number] CLARITIN-D 24 HOUR 10-240 MG XR24H-TAB (LORATADINE-PSEUDOEPHEDRINE) one by mouth once daily  #30 x 1   Entered and Authorized by:   Clydie Braun MD   Signed by:   Clydie Braun MD on 08/11/2009   Method used:   Print then Give to Patient   RxID:   [id number]  Process Orders Check Orders Results:     [facility]: ABN not required for this insurance Tests Sent for requisitioning (August 11, 2009 12:17 PM):     08/11/2009: [facility] -- T-Drug Screen-Urine, (single) [[id number]] (signed)

## 2010-07-14 NOTE — Miscellaneous (Signed)
Summary: HIV-1 RNA, CD4 (RESEARCH)  Clinical Lists Changes  Observations: Added new observation of CD4 COUNT: 713 microliters (04/17/2009 9:54) Added new observation of HIV1RNA QA: 21,437 copies/mL (04/17/2009 9:54)

## 2010-07-14 NOTE — Progress Notes (Signed)
Summary: rf request for percocet  Phone Note Call from Patient   Caller: Patient Summary of Call: refill request for percocet last filled on 11/02/2009 Initial call taken by: Starleen Arms CMA,  December 02, 2009 2:30 PM    Prescriptions: PERCOCET 7.5-325 MG TABS (OXYCODONE-ACETAMINOPHEN) one by mouth q 6 hours as needed for pain  #120 x 0   Entered by:   Starleen Arms CMA   Authorized by:   Acey Lav MD   Signed by:   Starleen Arms CMA on 12/04/2009   Method used:   Print then Give to Patient   RxID:   [id number]

## 2010-07-14 NOTE — Assessment & Plan Note (Signed)
Summary: HIV-1 RNA, CD4 (RESEARCH)   

## 2010-07-14 NOTE — Assessment & Plan Note (Signed)
Summary: Prior authorization  Prescriptions: MS CONTIN 15 MG XR12H-TAB (MORPHINE SULFATE) Take 1 tablet by mouth two times a day  #60 x 0   Entered and Authorized by:   Acey Lav MD   Signed by:   Paulette Blanch Dam MD on 03/15/2010   Method used:   Print then Give to Patient   RxID:   [id number]  I tried to get preauthorization but the pt will need to be on MS contin first rather than oxycontin and will need form filled out for this which is beign faxed to me  Prior authorization form was not sent to me. Can we call the 1800 number for Medicaid prior authorization again and get them to refax [place] that form?

## 2010-07-14 NOTE — Assessment & Plan Note (Signed)
Summary: boil/tkk   Visit Type:  Follow-up Primary Provider:  Paulette Blanch Dam  CC:  ? boil.  History of Present Illness: 40 yo transgender male with HIV and hidradenitis.She retubs today as urgent work int for 2 problems  #1 appearnace of bump in perirectal area that is not pruritic or painful but is felt with wiping her butt  #2 worsening wheezing after recent URI congestion.  We also reviewed her recent HIV VL and CD4 counts.  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 One By Mouth Once Daily 3)  Alprazolam 2 Mg Tabs (Alprazolam) .... One By Mouth Two Times A Day As Needed For Anxiety 4)  Norvir 100 Mg Tabs (Ritonavir) .... Take 1 Tablet By Mouth Once A Day 5)  Reyataz 300 Mg Caps (Atazanavir Sulfate) .Marland Kitchen.. 1 By Mouth Daily 6)  Truvada 200-300 Mg Tabs (Emtricitabine-Tenofovir) .Marland Kitchen.. 1 By Mouth Daily 7)  Percocet  7.5-325 Mg Tabs (Oxycodone-Acetaminophen) .... One By Mouth Q 6 Hours As Needed For Pain 8)  Omeprazole 20 Mg Cpdr (Omeprazole) .... Please Take No More Than 65m of Omeprazole and Take It 12 Hours Before Reyataz and Norvir Dose 9)  Aldactone 50 Mg Tabs (Spironolactone) .... Take One Half Tablet Daily 10)  Claritin-D 24 Hour 10-240 Mg Xr24h-Tab (Loratadine-Pseudoephedrine) .... One By Mouth Once Daily 11)  Trazodone Hcl 100 Mg Tabs (Trazodone Hcl) .... One By Mouth Qhs 12)  Lexapro 20 Mg Tabs (Escitalopram Oxalate) .... One By Mouth Once Daily 13)  Doxycycline Hyclate 100 Mg Caps (Doxycycline Hyclate) .... One By Mouth Two Times A Day 14)  Asmanex 30 Metered Doses 220 Mcg/inh Aepb (Mometasone Furoate) .... Inhale 1 Puffs Twice A Day 15)  Zofran 4 Mg Tabs (Ondansetron Hcl) .... Take 1 Tablet By Mouth Four Times A Day As Needed Nausea 16)  Amoxicillin 500 Mg Caps (Amoxicillin) .... Take 1 Capsule By Mouth Three Times A Day For 10 Days 17)  Ms Contin 15 Mg Xr12h-Tab (Morphine Sulfate) .... Take 1 Tablet By Mouth Two Times A Day  Current Medications (verified): 1)  Proventil Hfa 108 (90 Base) Mcg/act  Aers (Albuterol Sulfate) 2)  Metformin Hcl 500 Mg Tabs (Metformin Hcl) .... One By Mouth Once Daily 3)  Alprazolam 2 Mg Tabs (Alprazolam) .... One By Mouth Two Times A Day As Needed For Anxiety 4)  Norvir 100 Mg Tabs (Ritonavir) .... Take 1 Tablet By Mouth Once A Day 5)  Reyataz 300  Mg Caps (Atazanavir Sulfate) .Marland Kitchen.. 1 By Mouth Daily 6)  Truvada 200-300 Mg Tabs (Emtricitabine-Tenofovir) .Marland Kitchen.. 1 By Mouth Daily 7)  Percocet 7.5-325 Mg Tabs (Oxycodone-Acetaminophen) .... One By Mouth Q 6 Hours As Needed For Pain 8)  Omeprazole 20 Mg Cpdr (Omeprazole) .... Please Take No More Than 44m of Omeprazole and Take It 12 Hours Before Reyataz and Norvir Dose 9)  Aldactone 50 Mg Tabs (Spironolactone) .... Take One Half Tablet Daily 10)  Claritin-D 24 Hour 10-240 Mg Xr24h-Tab (Loratadine-Pseudoephedrine) .... One By  Mouth Once Daily 11)  Trazodone Hcl 100 Mg Tabs (Trazodone Hcl) .... One By Mouth Qhs 12)  Lexapro 20 Mg Tabs (Escitalopram Oxalate) .... One By Mouth Once Daily 13)  Doxycycline Hyclate 100 Mg Caps (Doxycycline Hyclate) .... One By Mouth Two Times A Day 14)  Asmanex 30 Metered Doses 220 Mcg/inh Aepb (Mometasone Furoate) .... Inhale 1 Puffs Twice A Day 15)  Zofran 4 Mg Tabs (Ondansetron Hcl) .... Take 1 Tablet By Mouth Four Times A Day As Needed Nausea 16)  Amoxicillin 500 Mg Caps (Amoxicillin) .... Take 1 Capsule By Mouth Three Times A Day For 10 Days 17)  Ms Contin 15 Mg Xr12h-Tab (Morphine Sulfate) .... Take 1 Tablet By Mouth Two Times A Day 18)  Imiquimod 5 % Crea (Imiquimod) .... Apply Three Times A Week At Bedtime and Leave On For 6-10 Hrs Then Wash Off For 4 Months 19)  Prednisone 20 Mg Tabs (Prednisone) .... Two Tablets Daily For 5 Days 20)  Azithromycin 250 Mg Tabs (Azithromycin) .... Take Two Tablets On Day One Then One Tablet A Day Fo 4 Days  Allergies: 1)  ! Darvocet   Preventive Screening-Counseling & Management  Alcohol-Tobacco     Alcohol drinks/day: <1     Alcohol type: vodka     Smoking Status: current     Smoking Cessation Counseling: yes     Smoke Cessation Stage: precontemplative     Packs/Day: 0.5     Cans of tobacco/week: no     Passive Smoke Exposure: yes  Caffeine-Diet-Exercise     Caffeine use/day: tea     Does Patient Exercise: no     Type of exercise: finished physical therapy     Exercise (avg: min/session): 30-60     Times/week: <3  Safety-Violence-Falls     Seat Belt Use: yes   Current Allergies (reviewed today): ! 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  Saw a surgeon at that time (1990s) (Dr Lady Gary in [place]).  Had MRI, CT, myelograms, steroids injections, epidurals to control pain and was considered for surgery but pain subsided on its own.   Pain currently worse with walking, spreads down R leg.  Better with crouching and bending over.    Past Surgical History: Last updated: 06/11/2009 No surgeries  Family History: Last updated: 11/26/2007   Social History: Last updated: 07/28/2009 Broke up with his HV positive partner [name] Trying to get medicaid Current Smoker Alcohol use-no Drug use-yes-  occas marijuana  Risk Factors: Alcohol Use: <1 (03/30/2010) Caffeine Use: tea (03/30/2010) Exercise: no (03/30/2010)  Risk Factors: Smoking Status: current (03/30/2010) Packs/Day: 0.5 (03/30/2010) Cans of tobacco/wk: no (03/30/2010) Passive Smoke Exposure:  yes (03/30/2010)  Family History: Reviewed history from 11/26/2007 and no changes required. Bryce  Social History: Reviewed history from 07/28/2009 and no changes required. Broke up with his HV positive partner [name] Trying to get medicaid Current Smoker Alcohol use-no Drug use-yes-  occas marijuana  Review of Systems       The patient complains of dyspnea on exertion, prolonged cough, and suspicious skin lesions.  The patient denies anorexia, fever, weight loss, weight gain, vision loss, decreased hearing, hoarseness, chest pain, syncope, peripheral edema, headaches, hemoptysis, abdominal pain, melena, hematochezia, severe indigestion/heartburn, hematuria, incontinence, muscle weakness, transient blindness, difficulty walking, depression, unusual weight change, abnormal bleeding, and enlarged lymph nodes.    Vital Signs:  Patient profile:   40 year old male Height:      71.25 inches (180.97 cm) Weight:      281.0 pounds (127.73 kg) BMI:     39.06 Temp:     98.3 degrees F (36.83 degrees C) oral Pulse rate:   90 / minute BP sitting:   128 / 92  (left  arm)  Vitals Entered By: Baxter Hire) (March 30, 2010 3:11 PM) CC: ? boil Pain Assessment Patient in pain? yes     Location: buttocks area Intensity: 6 Type: aching Onset of pain  Constant Nutritional Status BMI of > 30 = obese Nutritional Status Detail appetite is okay per patient  Have you ever been in a relationship where you felt threatened, hurt or afraid?No   Does patient need assistance? Functional Status Self care Ambulation Normal        Medication Adherence: 03/30/2010   Adherence to medications reviewed with patient. Counseling to provide adequate adherence provided                                Physical Exam  General:  alert.  well-developed, well-nourished, and overweight-appearing.   Head:  normocephalic, atraumatic, and no abnormalities observed.   Eyes:  vision grossly intact, pupils equal, pupils round, and pupils reactive to light.   Ears:  no external deformities and ear piercing(s) noted.   Nose:  boggy erythema Mouth:  fair dentition.  pharynx pink and moist, no erythema, and no exudates.   Neck:  supple.   Lungs:  expiratory wheezes throughout Heart:  normal rate and regular rhythm.  no murmur and no gallop.   Abdomen:  soft and non-tender.   Neurologic:  alert & oriented X3 astrength normal in all extremities and gait normal.   Skin:  she has area in perirectal area that is raised, nontender nonpurulent, looks to be early HPV wart Psych:  Oriented X3.  memory intact for recent and remote and normally interactive.  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 SHe should have anal pap smear in future given this is likely HPV  Orders: Est. Patient Level IV ([address])  Problem # 2:  ACUTE BRONCHITIS (ICD-466.0)  give prednisone and zpack. Doubt she is very compliant with inhlaed steroid The following medications were removed from the  medication list:    Doxycycline Hyclate 100 Mg Caps (Doxycycline hyclate) ..... One by mouth two times a day    Amoxicillin 500 Mg Caps (Amoxicillin) .Marland Kitchen... Take 1 capsule by mouth three times a day for 10 days His updated medication list for this problem includes:    Proventil Hfa 108 (90 Base)  Mcg/act Aers (Albuterol sulfate)    Claritin-d 24 Hour 10-240 Mg Xr24h-tab (Loratadine-pseudoephedrine) ..... One by mouth once daily    Asmanex 30 Metered Doses 220 Mcg/inh Aepb (Mometasone furoate) ..... Inhale 1 puffs twice a day    Azithromycin 250 Mg Tabs (Azithromycin) .Marland Kitchen... Take two tablets on day one then one tablet a day fo 4 days  Orders: Est. Patient Level IV ([address])  Problem # 3:  HIV DISEASE (ICD-042)  superbly well controlled The following medications were removed from the medication list:    Doxycycline Hyclate 100 Mg Caps (Doxycycline hyclate) ..... One by mouth two times a day    Amoxicillin 500 Mg Caps (Amoxicillin) .Marland Kitchen... Take 1 capsule by mouth three times a day for 10 days His updated medication list for this problem includes:    Azithromycin 250 Mg Tabs (Azithromycin) .Marland Kitchen... Take two tablets on day one then one tablet a day fo 4 days  Diagnostics Reviewed:  HIV: HIV positive - not AIDS (06/17/2008)   CD4: 1233 (01/25/2010)   WBC: 9.9 (12/31/2008)   PMN (bands): 0 (08/12/2008)   Hgb: 16.2 (12/31/2008)   HCT: 47.9 (12/31/2008)   Platelets: 277 (12/31/2008) HIV genotype: See Comment (09/09/2008)   HIV-1 RNA: 39 (01/25/2010)   HBSAg: NEG (09/23/2008)  Orders: Est. Patient Level IV (60109)  Medications Added to Medication List This Visit: 1)  Imiquimod 5 % Crea (Imiquimod) .... Apply three times a week at bedtime and leave on for 6-10 hrs then wash off for 4 months 2)  Prednisone 20 Mg Tabs (Prednisone) .... Two tablets daily for 5 days 3)  Azithromycin 250 Mg Tabs (Azithromycin) .... Take two tablets on day one then one tablet a day fo 4 days  Other Orders: Influenza Vaccine  NON MCR (32355)  Prescriptions: ALPRAZOLAM 2 MG TABS (ALPRAZOLAM) one by mouth two times a day as needed for anxiety  #60 x 3   Entered by:   Jennet Maduro RN   Authorized by:   Acey Lav MD   Signed by:   Jennet Maduro RN on 03/30/2010   Method used:   Telephoned to ...       [name] (retail)       [address]       [place], [place]  [address]       Ph: [phone]       Fax: [phone]   RxID:   [id number] AZITHROMYCIN 250 MG TABS (AZITHROMYCIN) take two tablets on day one then one tablet a day fo 4 days  #6 x 0   Entered and Authorized by:   Acey Lav MD   Signed by:   Jennet Maduro RN on 03/30/2010   Method used:   Electronically to        [name] (retail)       [address] [place], [place]  [address]       Ph: [phone]       Fax: [phone]   RxID:   [id number] PREDNISONE 20 MG TABS (PREDNISONE) two tablets daily for 5 days  #10 x 1   Entered and Authorized by:   Acey Lav MD   Signed by:   Jennet Maduro RN on 03/30/2010   Method used:   Electronically to  [name] (retail)       [address]       [place], [place]  [address]       Ph: [phone]       Fax: [phone]   RxID:   [id number] ZOFRAN 4 MG TABS (ONDANSETRON HCL) Take 1 tablet by mouth four times a day as needed nausea  #60 x 11   Entered and Authorized by:   Acey Lav MD   Signed by:   Jennet Maduro RN on 03/30/2010   Method used:   Electronically to        [name] (retail)       [address] [place], [place]  [address]       Ph: [phone]       Fax: [phone]   RxID:   [id number] ASMANEX 30 METERED DOSES 220 MCG/INH AEPB (MOMETASONE FUROATE) Inhale 1 puffs twice a day  #1 month x 11   Entered and Authorized by:   Acey Lav MD   Signed by:    Jennet Maduro RN on 03/30/2010   Method used:   Electronically to        [name] (retail)       [address]       [place], [place]  [address]       Ph: [phone]       Fax: [phone]   RxID:   [id number] LEXAPRO 20 MG TABS (ESCITALOPRAM OXALATE) one by mouth once daily  #30 x 11   Entered and Authorized by:   Acey Lav MD   Signed by:   Jennet Maduro RN on 03/30/2010   Method used:   Electronically to        [name] (retail)       [address]       [place], [place]  [address]       Ph: [phone]       Fax: [phone]   RxID:   [id number] TRAZODONE HCL 100 MG TABS (TRAZODONE HCL) one by mouth qhs  #30 x 11   Entered and Authorized by:   Acey Lav MD   Signed by:   Jennet Maduro RN on 03/30/2010   Method used:   Electronically to        [name] (retail)       [address] [place], [place]  [address]       Ph: [phone]       Fax: [phone]   RxID:   [id number] CLARITIN-D 24 HOUR 10-240 MG XR24H-TAB (LORATADINE-PSEUDOEPHEDRINE) one by mouth once daily  #30 x 11   Entered and Authorized by:   Acey Lav MD   Signed by:   Jennet Maduro RN on 03/30/2010   Method used:   Electronically to        [facility] [address] (retail)       [address]       [place], [place]  [address]  Ph: [phone]       Fax: [phone]   RxID:   [id number] ALDACTONE 50 MG TABS (SPIRONOLACTONE) take one half tablet daily  #30 x 11   Entered and Authorized by:   Acey Lav MD   Signed by:   Jennet Maduro RN on 03/30/2010   Method used:   Electronically to        [name] (retail)       [address]       [place], [place]  [address]       Ph: [phone]       Fax: [phone]   RxID:   [id number] OMEPRAZOLE 20 MG CPDR  (OMEPRAZOLE) please take NO MORE Than 38m of omeprazole and take it 12 hours before reyataz and norvir dose  #30 x 11   Entered and Authorized by:   Acey Lav MD   Signed by:   Jennet Maduro RN on 03/30/2010   Method used:   Electronically to        [name] (retail)       [address]       [place], [place]  [address]       Ph: [phone]       Fax: [phone]   RxID:   [id number] TRUVADA 200-300 MG TABS (EMTRICITABINE-TENOFOVIR) 1 by mouth daily  #30 x 11   Entered and Authorized by:   Acey Lav MD   Signed by:   Jennet Maduro RN on 03/30/2010   Method used:   Electronically to        [name] (retail)       [address]       [place], [place]  [address]       Ph: [phone]       Fax: [phone]   RxID:   [id number] REYATAZ 300 MG CAPS (ATAZANAVIR SULFATE) 1 by mouth daily  #30 x 11   Entered and Authorized by:   Acey Lav MD   Signed by:   Jennet Maduro RN on 03/30/2010   Method used:   Electronically to        [name] (retail)       [address]       [place], [place]  [address]       Ph: [phone]       Fax: [phone]   RxID:   [id number] NORVIR 100 MG TABS (RITONAVIR) Take 1 tablet by mouth once a day  #30 x 11   Entered and Authorized by:   Acey Lav MD   Signed by:   Jennet Maduro RN on 03/30/2010   Method used:   Electronically to        [name] (retail)       [address] [place], [place]  [address]       Ph: [phone]       Fax: [phone]   RxID:   [id number] METFORMIN HCL 500 MG TABS (METFORMIN HCL) one by mouth once daily  #30 x 11   Entered and Authorized by:  Acey Lav MD   Signed by:   Jennet Maduro RN on 03/30/2010   Method used:   Electronically to        [name] (retail)       [address] [place], [place]  [address]       Ph: [phone]       Fax: [phone]   RxID:   [id number] IMIQUIMOD 5 % CREA (IMIQUIMOD) apply three times a week at bedtime and leave on for 6-10 hrs then wash off for 4 months  #30 x 3   Entered and Authorized by:   Acey Lav MD   Signed by:   Paulette Blanch Dam MD on 03/30/2010   Method used:   Electronically to        [name] (retail)       [address] [place], [place]  [address]       Ph: [phone]       Fax: [phone]   RxID:   [id number]    Influenza Vaccine    Vaccine Type: Fluvax Non-MCR    Site: left deltoid    Mfr: novartis    Dose: 0.5 ml    Route: IM    Given by: Jennet Maduro RN    Exp. Date: 09/12/2010    Lot #: [id number]  Flu Vaccine Consent Questions    Do you have a history of severe allergic reactions to this vaccine? no    Any prior history of allergic reactions to egg and/or gelatin? no    Do you have a sensitivity to the preservative Thimersol? no    Do you have a past history of Guillan-Barre Syndrome? no    Do you currently have an acute febrile illness? no    Have you ever had a severe reaction to latex? no    Vaccine information given and explained to patient? yes

## 2010-07-14 NOTE — Assessment & Plan Note (Signed)
  Nurse Visit  Patient came by today and said she has been having vomiting every morning when she first gets up for the past 2 weeks. She takes her ARVS at night before she goes to sleep with food and thinks it may be related to the meds. She takes omeprazole in the morning for GERD. Since she has an appt with Dr. [name] in 2 days I told her to hold her meds for a few days until she sees him and see if her symptoms improve. She also relates this starting to when she got a new formulation of norvir from her local pharmacy (gelcap) recently.Deirdre Evener RN  March 08, 2010 3:12 PM   Allergies: 1)  ! Darvocet  Appended Document:  Selena Batten I have seen several patients have more symptoms switching from either gel cap to capsule or vice versa--they are not bio-equivalent. I wonder if we could get him back on the other version of norvir to see if this helps?

## 2010-07-14 NOTE — Assessment & Plan Note (Signed)
Summary: STUDY APPT/ LH    Current Allergies: ! DARVOCET Vital Signs:  Patient profile:   40 year old male Weight:      291 pounds (132.27 kg) BMI:     40.45 Temp:     97.9 degrees F oral Pulse rate:   76 / minute Resp:     18 per minute BP sitting:   139 / 82  (left arm) Is Patient Diabetic? No Pain Assessment Patient in pain? no      Nutritional Status BMI of > 30 = obese  Does patient need assistance? Functional Status Self care Ambulation Normal   Patient here for week 64 study visit. She continues to be bothered with boils  now currently under the rt arm and buttock area. She also says that a new area around her rt eye has come up and burst about a month ago and feels like it is coming back. The area under the eye is more swollen than the other side. The rt shoulder has been aching more lately with some nerve pain down the arm. I recommended she go back to the sports medicine MD for followup. She has noticed some numbness in the rt little toe past 2 weeks. [name] Evener RN  January 25, 2010 9:44 AM    Other Orders: Est. Patient Research Study [id number]) T-Basic Metabolic Panel [phone]) T-Hepatic Function [phone]) T-Lipid Profile [phone]) T-Phosphorus [phone])

## 2010-07-14 NOTE — Assessment & Plan Note (Signed)
Summary: LUQ abd pain/tkk   Primary Provider:  Clydie Braun  CC:  LUQ abd pain/ ear ache in both ear.  History of Present Illness: 40 yo with HIV+ followed by [facility] and Dr Sampson Goon <CD4 COUNT: 798 microliters (05/27/2009 10:04) Added new observation of HIV1RNA QA: 55 copies/mL (05/27/2009 10:04)>. Has recently been seen as well for boils.  Complains today of ear infections in both his ears. Also "something going on in my body, fluttering, hurts".   having d/c from R ear. hearing is significatnly decreased. no headache. chills 2-12.   Preventive Screening-Counseling & Management  Alcohol-Tobacco     Alcohol drinks/day: 0     Smoking Status: current     Smoking Cessation Counseling: yes     Smoke Cessation Stage: precontemplative     Packs/Day: 0.5     Cans of tobacco/week: no     Passive Smoke Exposure: yes  Caffeine-Diet-Exercise     Caffeine use/day: 0     Does Patient Exercise: no     Type of exercise: finished physical therapy     Exercise (avg: min/session): 30-60     Times/week: <3  Safety-Violence-Falls     Seat Belt Use: yes      Drug Use:  yes.     Updated Prior Medication List: PROVENTIL HFA 108 (90 BASE) MCG/ACT  AERS (ALBUTEROL SULFATE)  TRAZODONE HCL 100 MG TABS (TRAZODONE HCL) Take 1 tablet by mouth at bedtime METFORMIN HCL 500 MG TABS (METFORMIN HCL) one by mouth once daily ALPRAZOLAM 2 MG TABS (ALPRAZOLAM) one by mouth two times a day as needed for anxiety NORVIR 100 MG TABS (RITONAVIR) Take 1 tablet by mouth once a day REYATAZ 300 MG CAPS (ATAZANAVIR SULFATE) 1 by mouth daily TRUVADA 200-300 MG TABS (EMTRICITABINE-TENOFOVIR) 1 by mouth daily LEXAPRO 20 MG TABS (ESCITALOPRAM OXALATE) once daily PERCOCET 7.5-325 MG TABS (OXYCODONE-ACETAMINOPHEN) one by mouth q 6 hours as needed for pain OMEPRAZOLE 20 MG CPDR (OMEPRAZOLE) please take NO MORE Than 36m of omeprazole and take it 12 hours before reyataz and norvir dose ALDACTONE 50 MG TABS (SPIRONOLACTONE)  take one half tablet daily  Current Allergies (reviewed today): ! 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  Saw a surgeon at that time (1990s) (Dr Lady Gary in [place]).  Had MRI, CT, myelograms, steroids injections, epidurals to control pain and was considered for surgery but pain subsided on its own.   Pain currently worse with walking, spreads down R leg.  Better with crouching and bending over.    Current Medications (verified): 1)  Proventil Hfa 108 (90 Base) Mcg/act  Aers (Albuterol Sulfate) 2)  Trazodone Hcl 100 Mg Tabs (Trazodone Hcl) .... Take 1 Tablet By Mouth At Bedtime 3)  Metformin Hcl 500 Mg Tabs (Metformin Hcl) .... One By Mouth Once Daily 4)  Alprazolam 2 Mg Tabs (Alprazolam) .... One By Mouth Two Times A Day As Needed For Anxiety 5)  Norvir 100 Mg Tabs (Ritonavir) .... Take 1 Tablet By Mouth Once A Day 6)  Reyataz 300 Mg Caps (Atazanavir Sulfate) .Marland Kitchen.. 1 By Mouth Daily 7)  Truvada 200-300 Mg Tabs (Emtricitabine-Tenofovir) .Marland Kitchen.. 1 By Mouth Daily 8)  Lexapro 20 Mg Tabs (Escitalopram Oxalate) .... Once Daily  9)  Percocet 7.5-325 Mg Tabs (Oxycodone-Acetaminophen) .... One By Mouth Q 6 Hours As Needed For Pain 10)  Omeprazole 20 Mg Cpdr (Omeprazole) .... Please Take No More Than 70m of Omeprazole and Take It 12 Hours Before Reyataz and Norvir Dose 11)  Aldactone 50 Mg Tabs (Spironolactone) .... Take One Half Tablet Daily  Allergies (verified): 1)  ! Darvocet   Social History: Broke up with his HV positive partner [name] Trying to get medicaid Current Smoker Alcohol use-no Drug use-yes-  occas marijuana Drug Use:  yes  Review of Systems       wt down 4#, no n/v, no diarrhea.   Vital  Signs:  Patient profile:   40 year old male Height:      71 inches (180.34 cm) Weight:      272.6 pounds (123.91 kg) BMI:     38.16 Temp:     97.8 degrees F (36.56 degrees C) oral Pulse rate:   91 / minute BP sitting:   127 / 87  (left arm)  Vitals Entered By: Blenda Mounts (July 28, 2009 2:27 PM) CC: LUQ abd pain/ ear ache in both ear Pain Assessment Patient in pain? yes     Location: abdomen/ears Intensity: 10+ Type: aching Onset of pain  constant pain since sunday night Nutritional Status BMI of > 30 = obese Nutritional Status Detail appetite "alright" per pt  Have you ever been in a relationship where you felt threatened, hurt or afraid?No   Does patient need assistance? Functional Status Self care Ambulation Normal        Medication Adherence: 07/28/2009   Adherence to medications reviewed with patient. Counseling to provide adequate adherence provided   Prevention For Positives: 07/28/2009   Safe sex practices discussed with patient. Condoms offered.                             Physical Exam  General:  well-developed, well-nourished, and well-hydrated.   Eyes:  pupils equal, pupils round, and pupils reactive to light.   Ears:  R canal inflamed, R Canal drainage, R TM erythema, L canal inflamed, and L TM erythema.   Mouth:  pharynx pink and moist and no exudates.  mild erythema Neck:  no masses.   Lungs:  normal respiratory effort and normal breath sounds.   Heart:  normal rate, regular rhythm, and no murmur.   Abdomen:  soft and normal bowel sounds.  L sided tenderness with deep palpation, no rebound or gaurding.    Impression & Recommendations:  Problem # 1:  OTITIS MEDIA, BILATERAL (ICD-382.9) will start pt on levaquin for OM and suspected otitis externa. Will have f/u with dr fitzgerald. pt to call if not improving.   The following medications were removed from the medication list:    Doxycycline Hyclate 100 Mg Caps (Doxycycline hyclate)  ..... One by mouth two times a day His updated medication list for this problem includes:    Levaquin 750 Mg Tabs (Levofloxacin) .Marland Kitchen... Take 1 tablet by mouth once a day  Problem # 2:  HIV DISEASE (ICD-042) taking ART well. offered condoms. start Hep A, give Hep B #3, Flu shot. . space ATVr 12 hours from PPI.  The following medications were removed from the medication list:    Doxycycline Hyclate 100 Mg Caps (Doxycycline hyclate) ..... One by mouth two times a day His updated medication list for this problem includes:    Levaquin 750 Mg  Tabs (Levofloxacin) .Marland Kitchen... Take 1 tablet by mouth once a day  Medications Added to Medication List This Visit: 1)  Levaquin 750 Mg Tabs (Levofloxacin) .... Take 1 tablet by mouth once a day  Prescriptions: LEVAQUIN 750 MG TABS (LEVOFLOXACIN) Take 1 tablet by mouth once a day  #10 x 0   Entered and Authorized by:   Johny Sax MD   Signed by:   Johny Sax MD on 07/28/2009   Method used:   Electronically to        [name] (retail)       [address]       [place], [place]  [address]       Ph: [phone]       Fax: [phone]   RxID:   [id number]   Appended Document: Orders Update    Clinical Lists Changes  Medications: Removed medication of LEVAQUIN 750 MG TABS (LEVOFLOXACIN) Take 1 tablet by mouth once a day Added new medication of AMOXICILLIN 500 MG CAPS (AMOXICILLIN) Take 1 tablet by mouth three times a day - Signed Added new medication of CIPROFLOXACIN HCL 500 MG TABS (CIPROFLOXACIN HCL) Take 1 tablet by mouth two times a day - Signed Rx of AMOXICILLIN 500 MG CAPS (AMOXICILLIN) Take 1 tablet by mouth three times a day;  #30 x 0;  Signed;  Entered by: Johny Sax MD;  Authorized by: Johny Sax MD;  Method used: Electronically to [facility] [address], [place], [place]  [id number], Ph: [phone], Fax: [phone] Rx of CIPROFLOXACIN HCL 500 MG TABS  (CIPROFLOXACIN HCL) Take 1 tablet by mouth two times a day;  #20 x 0;  Signed;  Entered by: Johny Sax MD;  Authorized by: Johny Sax MD;  Method used: Electronically to [facility], [place], [place]  [id number], Ph: [phone], Fax: [phone]    Prescriptions: CIPROFLOXACIN HCL 500 MG TABS (CIPROFLOXACIN HCL) Take 1 tablet by mouth two times a day  #20 x 0   Entered and Authorized by:   Johny Sax MD   Signed by:   Johny Sax MD on 07/28/2009   Method used:   Electronically to        [facility] (retail)       [address]       [place], [place]  [id number]       Ph: [phone]       Fax: [phone]   RxID:   [id number] AMOXICILLIN 500 MG CAPS (AMOXICILLIN) Take 1 tablet by mouth three times a day  #30 x 0   Entered and Authorized by:   Johny Sax MD   Signed by:   Johny Sax MD on 07/28/2009   Method used:   Electronically to        [facility] (retail)       [address]       [place], [place]  [id number]       Ph: [phone]       Fax: [phone]   RxID:   [id number]    Appended Document: LUQ abd pain/tkk   Hepatitis B Vaccine # 3    Vaccine Type: HepB Adult    Site: right deltoid    Mfr: Merck    Dose: 1.0 ml    Route: IM    Given by: Baxter Hire)  Exp. Date: 05/16/2011    Lot #: [id number]    VIS given: 12/28/05 version given July 28, 2009.  Hepatitis A Vaccine # 1    Vaccine Type: HepA    Site: left deltoid    Mfr: Havrix    Dose: 1.0 ml    Route: IM    Given by: Kathi Simpers CMA(AAMA)    Exp. Date: 09/30/2011    Lot #: [id number]    VIS given: 08/31/04 version given July 28, 2009.  Influenza Vaccine    Vaccine Type: Fluvax Non-MCR    Site: left deltoid    Mfr: Novartis    Dose: 0.5 ml    Route: IM    Given by: Kathi Simpers CMA(AAMA)    Exp. Date: 09/11/2009    Lot #: [id number]    VIS given: 01/04/07  version given July 28, 2009.  Flu Vaccine Consent Questions    Do you have a history of severe allergic reactions to this vaccine? no    Any prior history of allergic reactions to egg and/or gelatin? no    Do you have a sensitivity to the preservative Thimersol? no    Do you have a past history of Guillan-Barre Syndrome? no    Do you currently have an acute febrile illness? no    Have you ever had a severe reaction to latex? no    Vaccine information given and explained to patient? yes   Appended Document: Orders Update    Clinical Lists Changes  Orders: Added new Service order of Est. Patient Level IV (78469) - Signed

## 2010-07-14 NOTE — Progress Notes (Signed)
Summary: Please change med.  Levaquin nonpreferred/not covered  Phone Note From Pharmacy   Caller: Samantha Crimes [facility] Summary of Call: Received a fax from patient's pharmacy stating the Levofloxacin 750mg  (all Levaquins) are not covered by Medicaid.  Did you want to change to one of the following:  Avelox, Cipro, or Floxin?  If so, we need strength, dose, and directions with any refills if you require.  Thanks Initial call taken by: Paulo Fruit  BS,CPht II,MPH,  August 11, 2009 4:56 PM  Follow-up for Phone Call        pt has been seen by Dr Carmon Ginsberg since this rx. no further need for levauin at this point.

## 2010-07-14 NOTE — Miscellaneous (Signed)
Summary: Medical Surgical Procedures  Medical Surgical Procedures   Imported By: Florinda Marker 03/15/2010 16:21:53  _____________________________________________________________________  External Attachment:    Type:   Image     Comment:   External Document

## 2010-07-14 NOTE — Assessment & Plan Note (Signed)
Summary: STUDY APPT/LH    Current Allergies: ! DARVOCET Social History: Tobacco Use:  no  Vital Signs:  Patient profile:   40 year old male Weight:      276.5 pounds (125.68 kg) BMI:     38.70 Temp:     97.4 degrees F oral Pulse rate:   76 / minute Resp:     16 per minute BP sitting:   122 / 87  (left arm) Is Patient Diabetic? No Pain Assessment Patient in pain? no      Nutritional Status BMI of > 30 = obese  Does patient need assistance? Functional Status Self care Ambulation Normal   Patient here for week 36 study visit. She continues to be bothered with boils currently in both armpits and perineal area. The last ones in the groin have burst. Still has occassional pain in the lower back, right shoulder and knees. She states she has been very adherent with her meds, only missing one dose in the past month. She will see Dr. Sampson Goon again on March 1st.Kim Epperson RN  July 14, 2009 9:58 AM    Complete Medication List: 1)  Proventil Hfa 108 (90 Base) Mcg/act Aers (Albuterol sulfate) 2)  Trazodone Hcl 100 Mg Tabs (Trazodone hcl) .... Take 1 tablet by mouth at bedtime 3)  Metformin Hcl 500 Mg Tabs (Metformin hcl) .... One by mouth once daily 4)  Alprazolam 2 Mg Tabs (Alprazolam) .... One by mouth two times a day as needed for anxiety 5)  Norvir 100 Mg Tabs (Ritonavir) .... Take 1 tablet by mouth once a day 6)  Reyataz 300 Mg Caps (Atazanavir sulfate) .Marland Kitchen.. 1 by mouth daily 7)  Truvada 200-300 Mg Tabs (Emtricitabine-tenofovir) .Marland Kitchen.. 1 by mouth daily 8)  Lexapro 20 Mg Tabs (Escitalopram oxalate) .... Once daily 9)  Percocet 7.5-325 Mg Tabs (Oxycodone-acetaminophen) .... One by mouth q 6 hours as needed for pain 10)  Doxycycline Hyclate 100 Mg Caps (Doxycycline hyclate) .... One by mouth two times a day 11)  Omeprazole 20 Mg Cpdr (Omeprazole) .... Please take no more than 66m of omeprazole and take it 12 hours before reyataz and norvir dose 12)  Aldactone 50 Mg Tabs  (Spironolactone) .... Take one half tablet daily  Other Orders: Est. Patient Research Study [phone]) T-Basic Metabolic Panel [id number]) T-Hepatic Function [id number]) T-Lipid Profile [phone]) T-Phosphorus [id number]) Process Orders Check Orders Results:     [facility]: ABN not required for this insurance Tests Sent for requisitioning (July 20, 2009 9:32 PM):     07/14/2009: [facility] -- T-Basic Metabolic Panel [phone] (signed)     07/14/2009: [facility] -- T-Hepatic Function [phone] (signed)     07/14/2009: [facility] -- T-Lipid Profile [phone] (signed)     07/14/2009: [facility] -- T-Phosphorus [id number] (signed)

## 2010-07-14 NOTE — Progress Notes (Signed)
Summary: Medication refill  Phone Note Call from Patient   Summary of Call: Patient called about refill on pain medication. Would pick up on 10-07-09. Patient of Dr. Sampson Goon. Initial call taken by: Kathi Simpers [facility]),  October 05, 2009 12:51 PM    Prescriptions: PERCOCET 7.5-325 MG TABS (OXYCODONE-ACETAMINOPHEN) one by mouth q 6 hours as needed for pain  #120 x 0   Entered by:   Tomasita Morrow RN   Authorized by:   Clydie Braun MD   Signed by:   Tomasita Morrow RN on 10/06/2009   Method used:   Print then Give to Patient   RxID:   [id number]

## 2010-07-14 NOTE — Progress Notes (Signed)
Summary: Want appt to reenroll for drug program  Phone Note Outgoing Call   Caller: Patient Reason for Call: Refill Medication Details for Reason: ADAP? Summary of Call: patient left a message stating that he has not received medications because he needs to renew for the [place] program.  He would like for someone to call him back to tell him what information he will need in order to get back on to the program. He stated [facility] called and told him they cannont send medication until he has reenrolled.   Initial call taken by: Paulo Fruit  BS,CPht II,MPH,  December 04, 2009 10:57 AM Call placed by: Paulo Fruit  BS,CPht II,MPH,  December 04, 2009 10:57 AM Call placed to: Patient Summary of Call: I had to leave a message for patient to call office so I can inform him what he will need in order to reenroll for ADAP if his medicaid is no longer active. Initial call taken by: Paulo Fruit  BS,CPht II,MPH,  December 04, 2009 10:59 AM     Appended Document: Want appt to reenroll for drug program Patient came in and stated that she now has Medicaid.  I told her since she has medicaid; she can go to any pharmacy to pick up her medication.  She will not be able to utilize [facility].    She wants medication called into [facility].

## 2010-07-14 NOTE — Miscellaneous (Signed)
Summary: HIV-1 RNA  (RESEARCH)  Clinical Lists Changes  Observations: Added new observation of HIV1RNA QA: 22,183 (05/06/2009 9:56)

## 2010-07-14 NOTE — Progress Notes (Signed)
Summary: Pt must take omeprazole (prilosec) 12 hours apart from reyataz  ---- Converted from flag ---- ---- 01/07/2010 2:27 PM, Starleen Arms CMA wrote: Can he have a rx for prilosec? He knows its over the counter but he has medicaid and he can get it for 3 dollars. ------------------------------  Phone Note Other Incoming   Summary of Call: Tamika, Mr Biegler already has a prescription for omeprazole (prilosec). It is imperative that he take this 12 hours apart from his reyataz and norivr. Initial call taken by: Acey Lav MD,  January 07, 2010 7:38 PM  Follow-up for Phone Call       Follow-up by: Acey Lav MD,  January 07, 2010 7:37 PM

## 2010-07-15 NOTE — Progress Notes (Signed)
Summary: pt. requesting refill on doxycycline  Phone Note From Pharmacy   Caller: [name] [phone]* Reason for Call: Needs renewal Summary of Call: requesting refill on doxycycline.  Please advise. Jennet Maduro RN  June 22, 2010 5:47 PM   Follow-up for Phone Call        that is fine Follow-up by: Acey Lav MD,  June 23, 2010 8:09 AM     Appended Document: pt. requesting refill on doxycycline Prescriptions: DOXYCYCLINE HYCLATE 100 MG CAPS (DOXYCYCLINE HYCLATE) Take 1 capsule by mouth two times a day  #60 x 2   Entered by:   Jennet Maduro RN   Authorized by:   Acey Lav MD   Signed by:   Jennet Maduro RN on 06/23/2010   Method used:   Electronically to        [name] (retail)       [address] [place], [place]  [address]       Ph: [phone]       Fax: [phone]   RxID:   [id number]

## 2010-07-15 NOTE — Miscellaneous (Signed)
Summary: HIV-1 RNA, CD4 (RESEARCH)  Clinical Lists Changes  Observations: Added new observation of CD4 COUNT: 1523 microliters (05/11/2010 14:36) Added new observation of HIV1RNA QA: 39 copies/mL (05/11/2010 14:36)

## 2010-07-22 ENCOUNTER — Encounter: Payer: Self-pay | Admitting: Infectious Disease

## 2010-07-23 ENCOUNTER — Emergency Department (HOSPITAL_COMMUNITY): Payer: Medicaid Other

## 2010-07-23 ENCOUNTER — Emergency Department (HOSPITAL_COMMUNITY)
Admission: EM | Admit: 2010-07-23 | Discharge: 2010-07-24 | Disposition: A | Discharge status: Home / Self Care | Payer: Medicaid Other | Attending: Emergency Medicine | Admitting: Emergency Medicine

## 2010-07-23 DIAGNOSIS — M545 Low back pain, unspecified: Secondary | ICD-10-CM | POA: Insufficient documentation

## 2010-07-23 DIAGNOSIS — R209 Unspecified disturbances of skin sensation: Secondary | ICD-10-CM | POA: Insufficient documentation

## 2010-07-23 DIAGNOSIS — E119 Type 2 diabetes mellitus without complications: Secondary | ICD-10-CM | POA: Insufficient documentation

## 2010-07-23 DIAGNOSIS — G8929 Other chronic pain: Secondary | ICD-10-CM | POA: Insufficient documentation

## 2010-07-23 DIAGNOSIS — M5430 Sciatica, unspecified side: Secondary | ICD-10-CM | POA: Insufficient documentation

## 2010-07-23 DIAGNOSIS — Z79899 Other long term (current) drug therapy: Secondary | ICD-10-CM | POA: Insufficient documentation

## 2010-07-23 DIAGNOSIS — E669 Obesity, unspecified: Secondary | ICD-10-CM | POA: Insufficient documentation

## 2010-07-23 DIAGNOSIS — J45909 Unspecified asthma, uncomplicated: Secondary | ICD-10-CM | POA: Insufficient documentation

## 2010-07-23 DIAGNOSIS — D72829 Elevated white blood cell count, unspecified: Secondary | ICD-10-CM | POA: Insufficient documentation

## 2010-07-23 DIAGNOSIS — M5126 Other intervertebral disc displacement, lumbar region: Secondary | ICD-10-CM | POA: Insufficient documentation

## 2010-07-23 DIAGNOSIS — Z21 Asymptomatic human immunodeficiency virus [HIV] infection status: Secondary | ICD-10-CM | POA: Insufficient documentation

## 2010-07-23 DIAGNOSIS — I10 Essential (primary) hypertension: Secondary | ICD-10-CM | POA: Insufficient documentation

## 2010-07-23 LAB — DIFFERENTIAL
Basophils Absolute: 0.1 10*3/uL (ref 0.0–0.1)
Lymphocytes Relative: 34 % (ref 12–46)
Monocytes Absolute: 1.6 10*3/uL — ABNORMAL HIGH (ref 0.1–1.0)
Neutro Abs: 7.2 10*3/uL (ref 1.7–7.7)

## 2010-07-23 LAB — CBC
HCT: 46.6 % (ref 39.0–52.0)
Hemoglobin: 16.8 g/dL (ref 13.0–17.0)
WBC: 13.7 10*3/uL — ABNORMAL HIGH (ref 4.0–10.5)

## 2010-07-23 LAB — URINALYSIS, ROUTINE W REFLEX MICROSCOPIC
Hgb urine dipstick: NEGATIVE
Specific Gravity, Urine: 1.031 — ABNORMAL HIGH (ref 1.005–1.030)
pH: 6 (ref 5.0–8.0)

## 2010-07-23 LAB — BASIC METABOLIC PANEL
CO2: 24 mEq/L (ref 19–32)
Glucose, Bld: 100 mg/dL — ABNORMAL HIGH (ref 70–99)
Potassium: 4.5 mEq/L (ref 3.5–5.1)
Sodium: 138 mEq/L (ref 135–145)

## 2010-07-23 LAB — URINE MICROSCOPIC-ADD ON

## 2010-07-23 MED ORDER — GADOBENATE DIMEGLUMINE 529 MG/ML IV SOLN
20.0000 mL | Freq: Once | INTRAVENOUS | Status: AC
  Administered 2010-07-23: 20 mL via INTRAVENOUS

## 2010-07-26 ENCOUNTER — Ambulatory Visit: Payer: Medicaid Other | Admitting: Adult Health

## 2010-07-29 ENCOUNTER — Telehealth: Payer: Self-pay | Admitting: Infectious Disease

## 2010-07-29 NOTE — Miscellaneous (Signed)
Summary: L leg pain & numbness x 10 days  Clinical Lists Changes     pt [name]) called to ask for appt for c/o multiple cysts under arms. Also c/o L leg numbness & burning pain. Hx back problems. states he had this on the R leg in the past. states it is fine now. advised going to ED today as we do not have an opening tomorrow & will likely need radiology/tests. he readily agreed to go & has someone to take him. 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 to use hot compresses until then. he agreed with plan. appt made.[name]  July 22, 2010 4:53 PM

## 2010-08-10 NOTE — Progress Notes (Signed)
Summary: wants to try oxycontin  Phone Note Call from Patient   Summary of Call: Patient wants to know since the morphine is not working for him can we retry the oxycontin to see if medicaid will pay for it this time? Initial call taken by: Starleen Arms CMA,  July 29, 2010 10:32 AM  Follow-up for Phone Call        Per Dr. [name]  he will increase his MS Contin from 15mg  to 30 mg Follow-up by: Starleen Arms CMA,  August 02, 2010 12:03 PM    New/Updated Medications: MS CONTIN 30 MG XR12H-TAB (MORPHINE SULFATE) take 1 tablet by mouth twice daily Prescriptions: PERCOCET 7.5-325 MG TABS (OXYCODONE-ACETAMINOPHEN) one by mouth q 6 hours as needed for pain  #120 x 0   Entered by:   Starleen Arms CMA   Authorized by:   Talmadge Chad NP   Signed by:   Starleen Arms CMA on 08/02/2010   Method used:   Print then Give to Patient   RxID:   [id number] MS CONTIN 30 MG XR12H-TAB (MORPHINE SULFATE) take 1 tablet by mouth twice daily  #60 x 0   Entered by:   Starleen Arms CMA   Authorized by:   Talmadge Chad NP   Signed by:   Starleen Arms CMA on 08/02/2010   Method used:   Print then Give to Patient   RxID:   [id number]   Appended Document: wants to try oxycontin gave him the scripts

## 2010-08-16 ENCOUNTER — Ambulatory Visit: Payer: Medicaid Other | Attending: Neurosurgery

## 2010-08-24 LAB — CREATININE, SERUM: GFR calc Af Amer: >60 mL/min (ref >60)

## 2010-08-24 LAB — BUN: BUN: 10 mg/dL (ref 6–23)

## 2010-08-30 ENCOUNTER — Ambulatory Visit: Payer: Self-pay

## 2010-08-31 ENCOUNTER — Other Ambulatory Visit: Payer: Self-pay | Admitting: Infectious Disease

## 2010-08-31 ENCOUNTER — Ambulatory Visit (INDEPENDENT_AMBULATORY_CARE_PROVIDER_SITE_OTHER): Payer: Medicaid Other | Admitting: Infectious Disease

## 2010-08-31 ENCOUNTER — Other Ambulatory Visit: Payer: Self-pay | Admitting: Licensed Clinical Social Worker

## 2010-08-31 VITALS — BP 130/92 | HR 72 | Temp 97.5°F | Resp 18 | Ht 72.0 in | Wt 278.8 lb

## 2010-08-31 DIAGNOSIS — M549 Dorsalgia, unspecified: Secondary | ICD-10-CM

## 2010-08-31 DIAGNOSIS — B20 Human immunodeficiency virus [HIV] disease: Secondary | ICD-10-CM

## 2010-08-31 LAB — LIPID PANEL
Cholesterol: 166 mg/dL (ref 0–200)
HDL: 34 mg/dL — ABNORMAL LOW (ref >39)
LDL Cholesterol: 76 mg/dL (ref 0–99)
Total CHOL/HDL Ratio: 4.9 Ratio
Triglycerides: 278 mg/dL — ABNORMAL HIGH (ref <150)
VLDL: 56 mg/dL — ABNORMAL HIGH (ref 0–40)

## 2010-08-31 LAB — HEPATIC FUNCTION PANEL
ALT: 41 U/L (ref 0–53)
Albumin: 4.2 g/dL (ref 3.5–5.2)
Total Protein: 7.2 g/dL (ref 6.0–8.3)

## 2010-08-31 LAB — URINALYSIS, ROUTINE W REFLEX MICROSCOPIC
Glucose, UA: NEGATIVE mg/dL
Hgb urine dipstick: NEGATIVE
Ketones, ur: NEGATIVE mg/dL
Leukocytes, UA: NEGATIVE
Protein, ur: NEGATIVE mg/dL

## 2010-08-31 LAB — PHOSPHORUS: Phosphorus: 3.6 mg/dL (ref 2.3–4.6)

## 2010-08-31 MED ORDER — MORPHINE SULFATE CR 30 MG PO TB12: 30.0000 mg | ORAL_TABLET | Freq: Two times a day (BID) | ORAL | Status: DC

## 2010-08-31 MED ORDER — OXYCODONE-ACETAMINOPHEN 7.5-325 MG PO TABS: 1.0000 | ORAL_TABLET | Freq: Four times a day (QID) | ORAL | Status: DC | PRN

## 2010-08-31 MED ORDER — ALBUTEROL SULFATE HFA 108 (90 BASE) MCG/ACT IN AERS: 2.0000 | INHALATION_SPRAY | Freq: Four times a day (QID) | RESPIRATORY_TRACT | Status: DC | PRN

## 2010-08-31 NOTE — Progress Notes (Signed)
Patient here for week 96 study visit. She is tearful and depressed today. She denies wanting to hurt herself , but says she has thought about it recently, but not made any plans. We discussed options and she refuses counseling at this time. She has recently split with her partner. She is willing to do an assessment with [facility] for services (help with bills, housing, etc) Audible wheezing was noted on exam, she says she has run out of refills on her asthma medicine. Denies any other new problems.She will return in July for a study visit. I instructed her to make an appt to see Dr. [name] soon.

## 2010-09-01 LAB — BASIC METABOLIC PANEL WITH GFR
CO2: 23 mEq/L (ref 19–32)
Calcium: 9.4 mg/dL (ref 8.4–10.5)
Chloride: 103 mEq/L (ref 96–112)
Glucose, Bld: 109 mg/dL — ABNORMAL HIGH (ref 70–99)
Potassium: 4.3 mEq/L (ref 3.5–5.3)
Sodium: 139 mEq/L (ref 135–145)

## 2010-09-01 LAB — HEPATITIS C ANTIBODY: HCV Ab: NEGATIVE

## 2010-09-01 LAB — HEPATITIS B SURFACE ANTIBODY,QUALITATIVE: Hep B S Ab: NEGATIVE

## 2010-09-01 LAB — HEPATITIS B CORE ANTIBODY, TOTAL: Hep B Core Total Ab: NEGATIVE

## 2010-09-13 ENCOUNTER — Other Ambulatory Visit: Payer: Self-pay | Admitting: Infectious Disease

## 2010-09-13 DIAGNOSIS — F419 Anxiety disorder, unspecified: Secondary | ICD-10-CM

## 2010-09-17 LAB — DIFFERENTIAL
Basophils Relative: 0 % (ref 0–1)
Eosinophils Absolute: 0.1 10*3/uL (ref 0.0–0.7)
Eosinophils Relative: 1 % (ref 0–5)
Neutrophils Relative %: 61 % (ref 43–77)

## 2010-09-17 LAB — BASIC METABOLIC PANEL
BUN: 12 mg/dL (ref 6–23)
CO2: 28 mEq/L (ref 19–32)
Chloride: 106 mEq/L (ref 96–112)
Creatinine, Ser: 0.77 mg/dL (ref 0.4–1.5)
Glucose, Bld: 101 mg/dL — ABNORMAL HIGH (ref 70–99)
Potassium: 3.7 mEq/L (ref 3.5–5.1)

## 2010-09-17 LAB — CBC
HCT: 46.1 % (ref 39.0–52.0)
MCHC: 34.5 g/dL (ref 30.0–36.0)
MCV: 96.3 fL (ref 78.0–100.0)
Platelets: 184 10*3/uL (ref 150–400)
RDW: 13.3 % (ref 11.5–15.5)

## 2010-09-22 LAB — POCT I-STAT, CHEM 8
BUN: 11 mg/dL (ref 6–23)
Calcium, Ion: 1.19 mmol/L (ref 1.12–1.32)
Chloride: 100 mEq/L (ref 96–112)
Creatinine, Ser: 0.8 mg/dL (ref 0.4–1.5)
Glucose, Bld: 103 mg/dL — ABNORMAL HIGH (ref 70–99)
Potassium: 4 mEq/L (ref 3.5–5.1)

## 2010-09-22 LAB — CBC
HCT: 46.7 % (ref 39.0–52.0)
Hemoglobin: 16.1 g/dL (ref 13.0–17.0)
RBC: 4.97 MIL/uL (ref 4.22–5.81)
RDW: 13.3 % (ref 11.5–15.5)
WBC: 9.6 10*3/uL (ref 4.0–10.5)

## 2010-09-22 LAB — CARDIAC PANEL(CRET KIN+CKTOT+MB+TROPI)
CK, MB: 1.8 ng/mL (ref 0.3–4.0)
Relative Index: INVALID (ref 0.0–2.5)
Total CK: 84 U/L (ref 7–232)
Troponin I: <0.01 ng/mL (ref 0.00–0.06)
Troponin I: <0.01 ng/mL (ref 0.00–0.06)

## 2010-09-22 LAB — GLUCOSE, CAPILLARY
Glucose-Capillary: 164 mg/dL — ABNORMAL HIGH (ref 70–99)
Glucose-Capillary: 198 mg/dL — ABNORMAL HIGH (ref 70–99)

## 2010-09-22 LAB — COMPREHENSIVE METABOLIC PANEL
AST: 156 U/L — ABNORMAL HIGH (ref 0–37)
Albumin: 3.7 g/dL (ref 3.5–5.2)
Alkaline Phosphatase: 89 U/L (ref 39–117)
Chloride: 100 mEq/L (ref 96–112)
GFR calc Af Amer: >60 mL/min (ref >60)
Potassium: 3.1 mEq/L — ABNORMAL LOW (ref 3.5–5.1)
Sodium: 132 mEq/L — ABNORMAL LOW (ref 135–145)
Total Bilirubin: 0.9 mg/dL (ref 0.3–1.2)
Total Protein: 7.1 g/dL (ref 6.0–8.3)

## 2010-09-22 LAB — PROTIME-INR: Prothrombin Time: 14.2 seconds (ref 11.6–15.2)

## 2010-09-22 LAB — POCT CARDIAC MARKERS
CKMB, poc: <1 ng/mL — ABNORMAL LOW (ref 1.0–8.0)
Troponin i, poc: <0.05 ng/mL (ref 0.00–0.09)

## 2010-09-23 LAB — T-HELPER CELL (CD4) - (RCID CLINIC ONLY): CD4 % Helper T Cell: 27 % — ABNORMAL LOW (ref 33–55)

## 2010-09-28 ENCOUNTER — Other Ambulatory Visit: Payer: Self-pay | Admitting: Licensed Clinical Social Worker

## 2010-09-28 DIAGNOSIS — M549 Dorsalgia, unspecified: Secondary | ICD-10-CM

## 2010-09-28 LAB — URINALYSIS, ROUTINE W REFLEX MICROSCOPIC
Bilirubin Urine: NEGATIVE
Glucose, UA: >1000 mg/dL — AB
Hgb urine dipstick: NEGATIVE
Ketones, ur: NEGATIVE mg/dL
Ketones, ur: NEGATIVE mg/dL
Leukocytes, UA: NEGATIVE
Leukocytes, UA: NEGATIVE
Nitrite: NEGATIVE
Nitrite: NEGATIVE
Protein, ur: NEGATIVE mg/dL
Protein, ur: NEGATIVE mg/dL
Specific Gravity, Urine: 1.026 (ref 1.005–1.030)
Urobilinogen, UA: 1 mg/dL (ref 0.0–1.0)
pH: 6 (ref 5.0–8.0)

## 2010-09-28 LAB — CBC
HCT: 44.4 % (ref 39.0–52.0)
HCT: 45.3 % (ref 39.0–52.0)
Hemoglobin: 15.4 g/dL (ref 13.0–17.0)
Hemoglobin: 16.1 g/dL (ref 13.0–17.0)
MCHC: 34.8 g/dL (ref 30.0–36.0)
MCHC: 35.5 g/dL (ref 30.0–36.0)
MCV: 92.1 fL (ref 78.0–100.0)
MCV: 92.7 fL (ref 78.0–100.0)
Platelets: 201 K/uL (ref 150–400)
Platelets: 240 K/uL (ref 150–400)
RBC: 4.82 MIL/uL (ref 4.22–5.81)
RBC: 4.89 MIL/uL (ref 4.22–5.81)
RDW: 11.9 % (ref 11.5–15.5)
RDW: 12.6 % (ref 11.5–15.5)
WBC: 7.3 K/uL (ref 4.0–10.5)
WBC: 9.4 K/uL (ref 4.0–10.5)

## 2010-09-28 LAB — COMPREHENSIVE METABOLIC PANEL WITH GFR
ALT: 176 U/L — ABNORMAL HIGH (ref 0–53)
AST: 111 U/L — ABNORMAL HIGH (ref 0–37)
Albumin: 3.7 g/dL (ref 3.5–5.2)
Alkaline Phosphatase: 84 U/L (ref 39–117)
BUN: 9 mg/dL (ref 6–23)
CO2: 28 meq/L (ref 19–32)
Calcium: 9 mg/dL (ref 8.4–10.5)
Chloride: 104 meq/L (ref 96–112)
Creatinine, Ser: 0.73 mg/dL (ref 0.4–1.5)
GFR calc non Af Amer: >60 mL/min
Glucose, Bld: 215 mg/dL — ABNORMAL HIGH (ref 70–99)
Potassium: 4.1 meq/L (ref 3.5–5.1)
Sodium: 137 meq/L (ref 135–145)
Total Bilirubin: 0.7 mg/dL (ref 0.3–1.2)
Total Protein: 6.9 g/dL (ref 6.0–8.3)

## 2010-09-28 LAB — COMPREHENSIVE METABOLIC PANEL
ALT: 265 U/L — ABNORMAL HIGH (ref 0–53)
AST: 136 U/L — ABNORMAL HIGH (ref 0–37)
Albumin: 3.9 g/dL (ref 3.5–5.2)
CO2: 27 mEq/L (ref 19–32)
Calcium: 9.2 mg/dL (ref 8.4–10.5)
Creatinine, Ser: 0.73 mg/dL (ref 0.4–1.5)
GFR calc Af Amer: >60 mL/min (ref >60)
GFR calc non Af Amer: >60 mL/min (ref >60)
Sodium: 139 mEq/L (ref 135–145)
Total Protein: 7.4 g/dL (ref 6.0–8.3)

## 2010-09-28 LAB — LIPASE, BLOOD: Lipase: 42 U/L (ref 11–59)

## 2010-09-28 LAB — DIFFERENTIAL
Eosinophils Relative: 1 % (ref 0–5)
Lymphocytes Relative: 36 % (ref 12–46)
Lymphs Abs: 3.4 10*3/uL (ref 0.7–4.0)
Monocytes Relative: 13 % — ABNORMAL HIGH (ref 3–12)

## 2010-09-28 LAB — DRUGS OF ABUSE SCREEN W/O ALC, ROUTINE URINE
Amphetamine Screen, Ur: NEGATIVE
Barbiturate Quant, Ur: NEGATIVE
Benzodiazepines.: NEGATIVE
Cocaine Metabolites: NEGATIVE
Creatinine,U: 178 mg/dL
Marijuana Metabolite: POSITIVE — AB
Methadone: NEGATIVE
Opiate Screen, Urine: NEGATIVE
Phencyclidine (PCP): NEGATIVE
Propoxyphene: NEGATIVE

## 2010-09-28 LAB — TSH: TSH: 1.165 u[IU]/mL (ref 0.350–4.500)

## 2010-09-28 LAB — THC (MARIJUANA), URINE, CONFIRMATION: Marijuana, Ur-Confirmation: 75 ng/mL

## 2010-09-28 LAB — URINE MICROSCOPIC-ADD ON

## 2010-09-28 MED ORDER — OXYCODONE-ACETAMINOPHEN 7.5-325 MG PO TABS: 1.0000 | ORAL_TABLET | Freq: Four times a day (QID) | ORAL | Status: DC | PRN

## 2010-09-28 MED ORDER — MORPHINE SULFATE CR 30 MG PO TB12: 30.0000 mg | ORAL_TABLET | Freq: Two times a day (BID) | ORAL | Status: DC

## 2010-10-07 ENCOUNTER — Encounter: Payer: Self-pay | Admitting: Infectious Disease

## 2010-10-07 LAB — HIV-1 RNA QUANT-NO REFLEX-BLD: HIV-1 RNA Viral Load: <40

## 2010-10-12 ENCOUNTER — Other Ambulatory Visit: Payer: Self-pay | Admitting: Infectious Disease

## 2010-10-12 DIAGNOSIS — F419 Anxiety disorder, unspecified: Secondary | ICD-10-CM

## 2010-10-26 ENCOUNTER — Other Ambulatory Visit: Payer: Self-pay | Admitting: *Deleted

## 2010-10-26 DIAGNOSIS — M549 Dorsalgia, unspecified: Secondary | ICD-10-CM

## 2010-10-26 MED ORDER — MORPHINE SULFATE CR 30 MG PO TB12: 30.0000 mg | ORAL_TABLET | Freq: Two times a day (BID) | ORAL | Status: DC

## 2010-10-26 MED ORDER — OXYCODONE-ACETAMINOPHEN 7.5-325 MG PO TABS: 1.0000 | ORAL_TABLET | Freq: Four times a day (QID) | ORAL | Status: DC | PRN

## 2010-10-26 NOTE — Op Note (Signed)
[name], [name]                ACCOUNT NO.:  [id number]   MEDICAL RECORD NO.:  [id number]          PATIENT TYPE:  INP   LOCATION:  5123                         FACILITY:  [facility]   PHYSICIAN:  Jefry H. Pollyann Kennedy, MD     DATE OF BIRTH:  20-Feb-1971   DATE OF PROCEDURE:  12/05/2006  DATE OF DISCHARGE:                               OPERATIVE REPORT   PREOPERATIVE DIAGNOSIS:  Neck abscess recurrent with chronic  sialolithiasis.   PROCEDURE:  Evacuation of neck abscess and exploration of neck  attempting to identify a submandibular stone.   SURGEON:  Jefry H. Pollyann Kennedy, MD   General endotracheal anesthesia was used.  No complications.   FINDINGS:  Thick milky purulent material contained within the abscess  cavity, diffuse granulation tissue and fibrotic tissue.  A stone was not  identified despite extensive searching.  Normal anatomic landmarks for  example hypoglossal nerve and lingual nerve were not identified during  this procedure.  The entire surgical bed was filled with fibrotic  tissue.   HISTORY:  40 year old gentleman with HIV disease presented few months  ago with severe sialoadenitis and sialolithiasis.  Underwent  submandibular gland removal and then developed an abscess  postoperatively and was found to have a residual large stone.  He has  now had two separate incisions and drainage for abscess and for removal  of stones but continued to have swelling and worsening early abscess  over the past couple of days.  Risks, benefits, alternatives,  complications of procedure explained to the patient who seemed to  understand and agreed to surgery.   PROCEDURE:  The patient was taken to the operating room, placed on the  operative table in supine position.  Following induction of general  endotracheal anesthesia, the neck was prepped and draped in standard  fashion on the left side.  Electrocautery was used to incise the  posterior two thirds of the old scar and the scar was  removed and the  neck was opened.  Abscess cavity was identified and culture samples were  taken.  The wound was opened further using blunt dissection.  The entire  surgical bed was explored extensively with bimanual examination with one  hand in the mouth and one handed in the neck using hemostat to spread  tissue in multiple areas attempting to identify and isolate the stone,  which I was not able to identify.  At this point the wound was irrigated  with saline and a quarter inch Penrose was placed in the depths of the  wound exited through the anterior aspect of the incision.  The deep  layer closure with a chromic was accomplished and skin staples were used  on the skin.  The floor of mouth incision was left open.  Scissors were  used to open the floor of mouth laterally and posteriorly along the  submandibular duct to assist in exploration.  Dressing was applied to  the neck.  The patient was awakened, extubated, transferred to recovery  in stable condition.     Jefry H. Pollyann Kennedy, MD  Electronically Signed  JHR/MEDQ  D:  12/06/2006  T:  12/06/2006  Job:  [id number]
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
## 2010-10-26 NOTE — Op Note (Signed)
[name], [name]                ACCOUNT NO.:  [id number]   MEDICAL RECORD NO.:  [id number]          PATIENT TYPE:  INP   LOCATION:  5732                         FACILITY:  [facility]   PHYSICIAN:  Suzanna Obey, M.D.       DATE OF BIRTH:  July 13, 1970   DATE OF PROCEDURE:  11/14/2006  DATE OF DISCHARGE:                               OPERATIVE REPORT   PREOPERATIVE DIAGNOSIS:  Left neck abscess.   POSTOPERATIVE DIAGNOSIS:  Left neck abscess.   SURGICAL PROCEDURES:  Incision and drainage of left neck abscess.   ANESTHESIA:  General.   ESTIMATED BLOOD LOSS:  Less than 10 mL.   INDICATIONS:  This is a 40 year old who has had an ongoing problem with  a left submandibular stones and abscess.  He had the gland excised and  afterwards a stone was noted to be remaining in the submandibular  triangle.  He developed a subsequent abscess and it was drained.  An  attempt in the office to remove the stone intraorally was unsuccessful.  He now presents with a 1-day history of, again, swelling in the left  submandibular area, pain and increasing erythema.  He was informed of  the risks and benefits of the procedure, and the procedure was  discussed.  He understands risks to be weakness of his marginal  mandibular nerve causing weakness of his lower lip, numbness, injury to  the hypoglossal nerve, scar, recurrence, and risk of the anesthetic.  All questions were answered and consent was obtained.   OPERATION:  Patient taken to the operating room and placed in the supine  position.  After adequate general endotracheal tube anesthesia, was  prepped and draped in the usual sterile manner.  An incision was made  with a 15 blade in the previous scar just through the skin.  Then with  blunt dissection with a hemostat, the pocket was entered fairly easily  with the pus extruding from the area.  This was cultured.  Opening the  area with a hemostat, there was several pieces of stone that were  removed.   These were 3 pieces of yellow material.  The wound was  explored with the hemostat a little further to make sure all the pus  areas of collection were opened.  The wound was irrigated.  A Penrose  drain, 1/2 inch, was placed and secured with a 3-0 nylon.  The patient  was awakened, brought to recovery in stable condition.  Counts correct.           ______________________________  Suzanna Obey, M.D.     JB/MEDQ  D:  11/14/2006  T:  11/15/2006  Job:  [id number]

## 2010-10-26 NOTE — Discharge Summary (Signed)
[name], [name]                ACCOUNT NO.:  [id number]   MEDICAL RECORD NO.:  [id number]          PATIENT TYPE:  OBV   LOCATION:  5036                         FACILITY:  [facility]   PHYSICIAN:  Madaline Guthrie, M.D.    DATE OF BIRTH:  13-Mar-1971   DATE OF ADMISSION:  04/27/2008  DATE OF DISCHARGE:  04/29/2008                               DISCHARGE SUMMARY   DISCHARGE DIAGNOSES:  1. Multiple axillary bilateral abscesses.  Secondary to hidradenitis      suppurativa, four abscesses I and D'd and healing well on      discharge.  2. Human immunodeficiency virus.  CD-4 count April 28, 2008, 910,      viral load 4500.  3. Tachycardia.  Likely secondary to #1, resolved on discharge.  4. Depression.  On Lexapro.  5. Back pain and (Lumbago).  On Neurontin and Roxicodone for pain      control.   DISCHARGE MEDICATIONS:  1. Lexapro 22 mg once daily.  2. Neurontin 400 mg three times daily.  3. Albuterol 90 mcg 1-2 puffs every 4-6 hours as needed for shortness      of breath.  4. Bactrim DS twice daily for 14 days.  5. Roxicodone 10 mg three times daily.  6. Bactroban 2% ointment nasal, apply to bilateral nares twice daily.  7. Bactroban 2% cream applied under both armpits three times daily.  8. Hibiclens packet, wash the body once daily, avoid eyes.   DISPOSITION/FOLLOWUP:  The patient was discharged from the unit in  stable condition and she will follow up with Dr. Sampson Goon in  Infectious Disease Clinic on May 06, 2008.  We have arranged home  health aid to come and change the packing on I&D sides and apply  dressings to same sides as well.  On follow-up visit, please evaluate if  site healing well and if packing needs to stay in or it can be  completely removed.  Iodoform packing used.  The patient was also  advised to apply Bactroban to areas three times daily, so please  evaluate if he actually follows the instructions.  During the  hospitalization, one of the issues was  placement in terms of home or  shelter and since home health aide does not come to shelter, the patient  was able to find a place, and he will temporarily stay with a friend in  [place], so please see if he still has a place to stay and if he  has regular visits by home health aide.  Also, please see if patient  taking antibiotic as prescribed and if it is working for him.  In the  past, he has mentioned that he gets breakouts even when on Bactrim and  it does not work for him, however, based on culture sensitivities,  Bactrim should work, so we discharged the patient on Bactrim for 14  days.  Please evaluate if he needs antibiotics for extended period of  time.   PROCEDURES PERFORMED:  April 27, 2008, incision and drainage on four  axillary auditory abscess, two under each armpit, packed with Iodoform  packing.   CONSULTATIONS:  None.   HISTORY OF PRESENT ILLNESS:  The patient is a 40 year old male with past  medical history of recurrent skin abscesses with MRSA positive cultures  in 09/2000.  Presents with bilateral axillary emphasis that started 4  days ago and had been getting progressively worse, more painful and they  are incised.  No pus came out of any of them.  Abscesses associated with  fever and chills, and the patient also reports getting abscesses ever  since he had surgery on his salivary glands in 2008.  He denies systemic  complaints.  No abdominal or urinary complaints.  No sick contacts or  exposures.  No trauma to the region.   PHYSICAL EXAMINATION:  VITAL SIGNS:  Temperature 98.2, blood pressure  158/98, pulse 114, respirations 18, saturating 96% on room air.  GENERAL:  The patient sitting in bed not in acute distress.  Sclera  nonicteric with no conjunctival pallor.  Extraocular movements intact.  Oral mucosa appeared moist with no thrush or pharyngeal erythema.  NECK:  Supple with no stiffness or palpable lymphadenopathy.  LUNGS:  Clear to auscultation  bilaterally with no wheezes, rhonchi or  rales.  The patient was speaking in full sentences.  CARDIOVASCULAR SYSTEM:  Regular rhythm with tachycardia.  However, no  murmurs, rubs or gallops.  ABDOMEN:  Soft, nontender, nondistended with no guarding and bowel  sounds were present.  There was no edema or cyanosis.  EXTREMITIES:  No lesions or rashes noted on the skin, small boil in the  groin area on the right side, 1 cm in size, round, tender to palpation  with no pus draining from it, otherwise, no palpable lymphadenopathy the  patient had no joint stiffness, no tenderness to palpation over the  joints and was moving all for extremities equally well.  NEUROLOGICAL:  Alert and oriented x3 with no focal neurologic deficits.   LABORATORY DATA:  White blood cells 7.9, ANC 4.2, hemoglobin 15.6, MCV  94.3, platelets 211.   HOSPITAL COURSE:  1. Multiple axillary bilateral abscesses - secondary to hidradenitis      suppurativa, four abscesses I and D'd, two under each armpit.      Procedure performed while the patient in ED, the patient was      started on vancomycin and wound cultures were collected.      Preliminary findings were negative.  The patient was switched to      oral antibiotics Bactrim and was discharged on Bactrim for 14 days.      The patient was also given for morphine for pain control.  He will      follow up with Dr. Sampson Goon in outpatient clinic.  I and D sites      were healing well.  However, right axillary abscess was still      tender to palpation and slightly red.  The patient might need      surgery if abscesses recur (hydradectomy).  Throughout the      hospitalization, the patient was afebrile and had no systemic      symptoms and no leukocytosis.  2. Code 042.  CD4 on this admission 910 with viral load 4500.  The      patient has been following up with Dr. Sampson Goon in outpatient      clinic.  3. Socially issues.  The patient has been homeless since June 2009  and      has lived in [place] with his partner who  paid bills.  However,      recently they split apart and he ended up on the street.  He was      diagnosed with 042 five years ago.  For now, he will stay with      friends in [place].  The patient also received food stamps in      his process of applying for Medicare instability.   DISCHARGE VITAL SIGNS:  Temperature 97.6, blood pressure 129/85, pulse  83 respirations 18, oxygen saturation 99% on room air.   LABORATORY DATA:  Sodium 141, potassium 4.5, chloride 106, bicarb 31,  BUN 6, creatinine 0.71, glucose 100, blood cells 7.8, hemoglobin 15.2,  platelets 204.   Over 30 minutes was spent on discharge.      Mliss Sax, MD  Electronically Signed      Madaline Guthrie, M.D.  Electronically Signed    IM/MEDQ  D:  04/30/2008  T:  05/01/2008  Job:  [id number]   cc:   Mick Sell, MD

## 2010-10-26 NOTE — Op Note (Signed)
NAMEKEEVIN, PANEBIANCO                ACCOUNT NO.:  [id number]   MEDICAL RECORD NO.:  [id number]          PATIENT TYPE:  INP   LOCATION:  5709                         FACILITY:  [facility]   PHYSICIAN:  Jefry H. Pollyann Kennedy, MD     DATE OF BIRTH:  [birth year]   DATE OF PROCEDURE:  05/14/2007  DATE OF DISCHARGE:                               OPERATIVE REPORT   PREOPERATIVE DIAGNOSES:  Recurrent left submandibular abscess and  sialolithiasis.   POSTOPERATIVE DIAGNOSES:  Recurrent left submandibular abscess and  sialolithiasis.   PROCEDURE:  Incision and drainage of abscess with retrieval of  submandibular stone.   SURGEON:  Dr. Pollyann Kennedy.   General endotracheal anesthesia was used.  No complications.   FINDINGS:  Very thick slightly greenish discolored purulent material  within the abscess cavity and a 4 or 5 mm stone within the abscess  cavity as well.   No complications.   HISTORY:  This is a 40 year old who presented initially with a  submandibular stone and ultimately underwent gland excision but there  was one stone that was in the ductal remnant that has caused persisting  and recurring abscess.  The risks, benefits, alternatives, and  complications of the procedure were explained to the patient who seemed  to understand and agreed to surgery.   DESCRIPTION OF PROCEDURE:  The patient was taken to the operating room,  placed on the operating table in supine position.  Following the  induction of general endotracheal anesthesia, the left neck was prepped  and draped in a standard fashion.  A 15 scalpel was used to incise  through the old scar directly into the abscess cavity and a large amount  of purulent material was obtained.  Culture and sensitivity testing  samples were sent.  The incision was enlarged and suction was used to  evacuate the abscess cavity.  It was inspected in a deeper fashion using  an [employer] retractor and the stone was identified and retrieved and  sent for  pathologic evaluation.  The cavity was bluntly dissected using  finger dissection and irrigated copiously with saline solution.  A  quarter inch Penrose was kept in the depths of the wound, secured in  place with a nylon suture and a sterile safety pin.  A dressing was  applied.  The patient was then awakened, extubated, and transferred to  recovery in stable condition.      Jefry H. Pollyann Kennedy, MD  Electronically Signed     JHR/MEDQ  D:  05/15/2007  T:  05/15/2007  Job:  [id number]
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
## 2010-10-26 NOTE — Discharge Summary (Signed)
NAME:  [name], [name]             ACCOUNT NO.:  [id number]   MEDICAL RECORD NO.:  [id number]          PATIENT TYPE:  INP   LOCATION:  5732                         FACILITY:  [facility]   PHYSICIAN:  Marinda Elk, M.D.DATE OF BIRTH:  May 03, 1971   DATE OF ADMISSION:  11/14/2006  DATE OF DISCHARGE:  11/18/2006                               DISCHARGE SUMMARY   CONTINUITY DOCTOR:  Dr. Roxan Hockey.   DISCHARGE DIAGNOSES:  1. Submandibular sialolithiasis with an abscess.  2. Human immunodeficiency virus.   MEDICATIONS ON DISCHARGE:  Bactrim double strength for two more days;  and Tylenol 650 mg p.o. every 6 hours for pain.   PROCEDURES PERFORMED:  CT scan of the head without contrast which showed  a 5.7 x 3.4 cm soft tissue density in the resection bed of the left  submandibular gland, possibly representing a hemorrhage or an abscess.  There was surrounding edema and stranding of the subcutaneous tissue and  fatty tissue deep to the platysma, also particularly on the left side,  and there was reactive adenopathy present bilaterally on the neck.   CONSULTATIONS:  ENT, Dr. Pollyann Kennedy.   HISTORY OF PRESENT ILLNESS:  [name] is a 40 year old male with a past  medical history of 042 with a CD4 count in March of 790, with a history  of submandibular gland resection in February of 2008, and submandibular  abscess that was I-and-D'd in May of 2008.  Now he presented with some  pain and swelling of the left submandibular gland.  The patient was seen  in the office by Dr. Pollyann Kennedy who tried to drain and excise a recurrent  stone in the site of resection.  However, he was unsuccessful.  He then  obtained a CT scan in the office that showed that the stone was actually  enclosed within scar tissue.  He started the patient on Avelox for three  days.  The patient finished his course four days prior to admission.   The day of admission he started feeling left lateral neck swelling and  pain, and it was  worsening.  The patient also had a fever and chills.  The pain was that of 9/10, radiating, and was mostly in his left side.  On the day of admission, his temperature was 99.5, blood pressure  136/97, pulse 74, respirations 18, O2 saturation 97% on room air.  The  patient was in acute distress because of the pain, and he was hungry.   PHYSICAL EXAMINATION:  Physical examination showed a swollen left  mandibular region and very exquisitely tender to touch, nonerythematous,  and tonsils were enlarged and there was erythema in the tonsils, but no  discharge.   LABORATORY INVESTIGATIONS:  On the day of discharge, sodium was 137,  potassium 3.9, chloride 103, bicarbonate 25, BUN 7, creatinine 0.6,  glucose 85.  White blood cells 12, ANC 64%, hemoglobin 15.9, with an MCV  of 89.9.  A chest x-ray was done that showed stable, diffuse chronic  bronchitis; otherwise no active disease.  A CT scan of the head without  contrast showed soft tissue swelling and  some collection, possibly  hemorrhage or an abscess.   HOSPITAL COURSE:  1. Left submandibular abscess:  On admission, the patient was put on      broad spectrum antibiotics because he had a history of MRSA, on      vancomycin, and ENT was consulted.  The patient was taken to the      operating room and was I-and-D'd on that same day.  The next day,      the patient was afebrile and he was feeling much better.  He was      relating that his swelling had come down and he was able to drink      more and more easily.  A Penrose drain was left in and vancomycin      was continued.  Also blood cultures and cultures of the abscess      were obtained.  Blood cultures were negative, and a Gram's stain of      the culture of the abscess showed gram positive cocci in pairs with      multiple organisms present, none predominant.  No Staphylococcus      aureus was isolated and no Streptococcus pathogens either were      isolated.  The next day ENT also  recommended to continue vancomycin      for a couple of more days until sensitivities were back.      Sensitivities did not grow anything.  So the patient was changed to      doxycycline for two more days.  Also the patient will follow up      with ENT at his outpatient clinic, Dr. Pollyann Kennedy, to follow up on this.  2. Human immunodeficiency virus:  The patient was asymptomatic from      his HIV throughout his hospital stay.  His last CD4 count was 790.      The patient was not on HAART therapy.  He is seen by Dr. Roxan Hockey      in the [facility] infectious disease clinic.  So he will schedule      an appointment with Dr. Roxan Hockey to resume his HAART therapy.   DISPOSITION:  The patient will be followed by Dr. Pollyann Kennedy at ENT, to  follow up on this chronic problem of sialolithiasis that the patient  has.  Also, will follow up on the wound to see if there is any kind of  drainage, or to see if the pain has recurred.  Also, the patient will  follow up with Dr. Roxan Hockey at the [facility] infectious disease  outpatient clinic to resume his HAART therapy which the patient was not  taking.   On the day of discharge, cultures of the abscess showed no growth to  date.  Urine culture showed no growth.  Blood cultures showed no growth.  White blood cells were 6.8, hemoglobin of 13.5, and platelets were  170,000.  Sodium was 139, potassium was 3.7, chloride 103, bicarbonate  of 31, glucose 195, BUN of 5, creatinine of 0.7, bilirubin of 1.0,  alkaline phosphatase 91, AST 35, ALT 56, total protein 7.1, albumin 3.5,  calcium 8.5.      Marinda Elk, M.D.  Electronically Signed     AF/MEDQ  D:  11/18/2006  T:  11/18/2006  Job:  [id number]
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
## 2010-10-26 NOTE — Discharge Summary (Signed)
[name], [name]                ACCOUNT NO.:  [id number]   MEDICAL RECORD NO.:  [id number]          PATIENT TYPE:  INP   LOCATION:  5505                         FACILITY:  [facility]   PHYSICIAN:  [name] Sails, MD      DATE OF BIRTH:  05/17/1971   DATE OF ADMISSION:  10/18/2007  DATE OF DISCHARGE:  10/19/2007                               DISCHARGE SUMMARY   DISCHARGE DIAGNOSES:  1. 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  2. Depression.  3. History of pharyngeal abscess.  4. Human immunodeficiency virus with last CD4 count 950.  5. History of questionable hidradenitis suppurativa.   CONSULTATIONS:  None.   DISCHARGE MEDICATIONS:  Include;  1. Clindamycin 300 mg p.o. every 8 hours for 10 days.  2. Vistaril 25 every 8 hours as needed.  3. Lexapro 20 mg p.o. daily.  4. Roxicodone 5 mg p.o. every 4 hours as need for pain.  5. Bactroban intranasal ointment applied to nostrils twice daily for 5      days.  6. Mupirocin 2% cream apply 3 times a day under arms for 10 days.  7. Colace 100 mg p.o. daily.   HOSPITAL FOLLOWUP:  The patient will follow up with Dr. Noel [name] at the  [facility], and he will have his skin abscess followed  up at that time.  We have placed the patient on mupirocin and  clindamycin and told him to use antibiotic soap in hopes of preventing  further skin abscesses.   PROCEDURE PERFORMED:  1. Incision and drainage of right axillary abscess on Oct 17, 2007.  2. Incision and drainage of left axillary abscess on Oct 19, 2007.   BRIEF ADMITTING HISTORY AND PHYSICAL:  This is a 40 year old man with  history of HIV with a last CD4 count of 930 with recurrent MRSA skin  abscesses.  He reports that 2 weeks prior to the admission he came to  the ED with a boil in his groin.  He was started on doxycycline and had  the abscess drained at that time.  He returned to hospital 1 week later  with a persistent  infection.  An I&D was repeated and the culture grew  out MRSA.  The culture also showed that the bacteria was resistant to  tetracycline.  He was started Bactrim at that time.  The patient  presented on the day of admission with new painful boils under his arms  bilaterally.  He had actually been taking Bactrim for 3 days prior to  that.  He relates that he has had recent chills, but no objective  fevers.  The pain under his arms is often moderate-to-severe in  intensity.  He also relates that he has had chronic diarrhea for the  last month.  That diarrhea is nonbloody, happens 3-4 times a day; it is  mostly watery.  He does not have any nausea, or abdominal pain with  this.   PHYSICAL EXAMINATION:  ADMITTING VITALS:  Temperature 98.9, blood  pressure 158/97, pulse 104, respiratory rate is 18, sating  98% on room  air.  GENERAL:  The patient is no acute distress signs.  HEENT:  Eyes:  Anicteric without any pallor.  ENT:  Moist mucous  membranes.  No exudates in his oropharynx.  NECK:  Supple without any JVD.  RESPIRATORY:  Good air movement with clear lungs bilaterally.  CARDIOVASCULAR:  Regular rate and rhythm.  No murmurs, rubs, or gallops.  S1 and S2 normal.  GI:  Bowel sounds are positive.  The patient had nontender, nondistended  abdomen.  EXTREMITIES:  Pulses +2 in the lower extremities and posterior tibial  region.  There are 2 abscesses in the left axilla.  The abscess in the  left axilla is 2 x 4 cm tender and erythematous.  The one in the right  is actually larger, 4 x 4 cm at least, very tender and very erythematous  and fluctuant.  NEUROLOGICAL:  The patient is alert and oriented x 3.  There is nonfocal  exam.  The cranial nerves II through XII being intact.  PSYCHE:  The patient is currently depressed, but is not suicidal or  harbor any homicidal ideations either.   ADMITTING LABORATORIES:  Sodium 139, potassium 3.4, chloride 103, bicarb  26, BUN of 11, creatinine  0.65, glucose 132.  White count 9.8,  hemoglobin 15.3, platelets 235, ANC 5.6, and MCV 91.8.   HOSPITAL COURSE:  1. History of recurrent abscesses.  The patient had a history of groin      abscesses and he was first admitted with new abscesses in his right      and left axilla.  His right axillary abscess was drained on his      first day of admission.  He was started on vancomycin.  He had a      culture done on the fluid drained from the abscess, which the      preliminary result indicates Staph aureus.  He also had blood      cultures drawn which are currently negative.  We started on a      course of vancomycin and subsequently the abscess that was drained      became less tender, less erythematous and appeared to be healing      well.  He has some drainage from his abscess site still occurring      at the time of discharge.  The left axillary abscess  was drained      on Oct 19, 2007.  The patient tolerated the procedure well.  We      taught the patient how to take care of the draining wounds upon      discharge.  We will continue the patient on clindamycin p.o. 300 mg      every 8 hours for 10 days.  He will also have him use Bactroban      nasal ointment over that time for 5 days.  We will also have the      patient use mupirocin 2% cream under his arms and in any other      effected area for 10 days.  We instructed the patient to use      antibacterial soap in addition to this.  We also recommended that      he do not shave under his arms at all.  The patient previously was      shaving under his arms frequently.  He will follow up again in the      Internal Medicine Clinic at  [place] in regards to his skin      abscesses.  2. HIV.  The patient is not on HAART .  His CD4 count is 930.  He was      followed Dr. Philipp [name].  He is stable in regards to this problem.  3. Depression.  The patient has a significant depression, and he is      currently taking Lexapro.  He does not have  any suicidal or      homicidal ideation.  His depression appeared to be stable while in      the hospital.  He will followed up on this at a later day on the      outpatient basis.   DISCHARGE LABORATORIES:  Sodium 140, potassium  of 4.2, chloride 106,  bicarb 27, BUN 6, creatinine 0.66, glucose 101.  Hemoglobin 14.6, white  count 8.3, and platelets count 220.   DISCHARGE VITALS:  Temperature 98.4, blood pressure 142/89, pulse 75,  respiratory rate is 20, sating 95% on room air.      [name] Sails, MD  Electronically Signed     CB/MEDQ  D:  10/19/2007  T:  10/20/2007  Job:  [id number]   cc:   Tresa Endo L. [name], M.D.
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
## 2010-10-29 NOTE — Discharge Summary (Signed)
NAMEJOAQUIM, [name]                ACCOUNT NO.:  [id number]   MEDICAL RECORD NO.:  [id number]          PATIENT TYPE:  IPS   LOCATION:  0301                          FACILITY:  [facility]   PHYSICIAN:  Anselm Jungling, MD  DATE OF BIRTH:  22-Sep-1970   DATE OF ADMISSION:  07/25/2008  DATE OF DISCHARGE:  08/06/2008                               DISCHARGE SUMMARY   IDENTIFYING DATA/REASON FOR ADMISSION:  This was an inpatient  psychiatric admission, one of several, for [name], a 40 year old  unmarried white male.  He was admitted in crisis after his same-sex  partner ended their long-term relationship.  He was depressed, hopeless  and helpless.  He was having suicidal thoughts.  Please refer to the  admission note for further details pertaining to the symptoms,  circumstances and history that led to his hospitalization.  He was given  an initial Axis I diagnosis of major depressive disorder, recurrent.   MEDICAL/LABORATORY:  The patient was medically and physically assessed  by the psychiatric nurse practitioner.  He came to [place] with a history of  HIV, chronic pain, and methicillin-resistant Staphylococcus aureus.  He  was treated with Bactrim DS orally twice daily for a 2-week course.  At  the end of his treatment he was referred to Dr. Sampson Goon for general  medical care.  There were no acute medical issues.   HOSPITAL COURSE:  The patient was admitted to the adult inpatient  psychiatric service.  He presented as a well-nourished, normally-  developed male who was alert, fully oriented, very pleasant but sad.  He  stated that he was real depressed.  He described no suicidal intent or  plan, but instead passive suicidal ideation.  There were no signs or  symptoms of psychosis or thought disorder.  He verbalized a strong  desire for help.   He was involved in the therapeutic milieu and was a good participant.  The patient's homelessness was a further precipitant and contributor to  his  ongoing depression.  However, case management worked closely with  the patient towards a plan to go to a program known as [facility].  This gave the patient hope and, with this possibility, he  began to show more in the way of hopeful affect and relief of  depression.   The patient was ultimately discharged to the [facility], [facility] not able to [facility] the patient at the time that he was  ready for discharge.  Other aftercare arrangements were made as follows.  The patient was to follow up at [facility] with an  appointment to see their psychiatrist on August 07, 2008, at 3 p.m..  The patient was instructed to remain in touch with [facility]  regarding possible availability of space for him within the week  following his discharge.   DISCHARGE MEDICATIONS:  1. Celexa 20 mg daily.  2. Trazodone 100 mg nightly.  3. Bactrim DS, 1 b.i.d. x2 weeks.   The patient was instructed to stop taking oxycodone and clindamycin.  He  was instructed to follow  up with Dr. Sampson Goon for routine medical  care.   DISCHARGE DIAGNOSES:  AXIS I:  Major depressive disorder, recurrent.  AXIS II:  Deferred.  AXIS III:  1.  History of human immunodeficiency virus.  2.  History of  methicillin-resistant Staphylococcus aureus.  AXIS IV:  Stressors severe.  AXIS V:  Global Assessment of Functioning on discharge 65.      Anselm Jungling, MD  Electronically Signed     SPB/MEDQ  D:  08/15/2008  T:  08/15/2008  Job:  [id number]

## 2010-10-29 NOTE — Discharge Summary (Signed)
[name], [name]                ACCOUNT NO.:  [id number]   MEDICAL RECORD NO.:  [id number]          PATIENT TYPE:  INP   LOCATION:  5736                         FACILITY:  [facility]   PHYSICIAN:  Jefry H. Pollyann Kennedy, MD     DATE OF BIRTH:  1971-03-16   DATE OF ADMISSION:  10/14/2006  DATE OF DISCHARGE:  10/19/2006                               DISCHARGE SUMMARY   ADMISSION DIAGNOSIS:  Submandibular abscess.   DISCHARGE DIAGNOSIS:  Submandibular abscess.   PROCEDURE DURING STAY:  Incision and drainage of submandibular abscess.  No complications during the hospitalization.   HISTORY:  This is a 40 year old who underwent submandibular gland  removal about 3 months ago for chronic sialolithiasis.  Several days  prior to admission, he presented to the office with some swelling of the  neck and some purulent material draining from the submandibular duct  remnant.  He was started on Avelox and progressively got worse until the  day of admission.  He was admitted to the hospital and later in the  morning underwent incision and drainage under general anesthesia.  Penrose drain was left in place until the day prior to discharge.  The  white count normalized and he was afebrile.  Culture did not grow any  resistant organism.  He was treated with intravenous vancomycin until  the day before discharge when he was switched to oral Augmentin.  He is  discharged to home, instructed to take Augmentin and to follow-up with  me next week.  He is found on CT to have residual stone in the ductal  remnant and that will need to be removed.  That will be attempted in the  office next week or in the next few weeks when the swelling has  completely subsided.      Jefry H. Pollyann Kennedy, MD  Electronically Signed     JHR/MEDQ  D:  10/19/2006  T:  10/19/2006  Job:  [id number]

## 2010-10-29 NOTE — Discharge Summary (Signed)
NAMESID, GREENER                ACCOUNT NO.:  [id number]   MEDICAL RECORD NO.:  [id number]          PATIENT TYPE:  INP   LOCATION:  5730                         FACILITY:  [facility]   PHYSICIAN:  Ileana Roup, M.D.  DATE OF BIRTH:  September 28, 1970   DATE OF ADMISSION:  09/02/2006  DATE OF DISCHARGE:  09/06/2006                               DISCHARGE SUMMARY   DISCHARGE DIAGNOSES:  1. Cellulitis of the left facial area which started from ruptured      pustule inside the left nostril which was culture-positive for      Methicillin-resistant Staph aureus.  2. HIV with CD4 count 740 on September 04, 2006.  3. History of frequent otitis media.  4. History of status post excision of left submandibular gland on      July 22, 2006 due to sialolithiasis and chronic sialoadenitis.  5. Hidradenitis suppurativa in June of 2007.   DISCHARGE MEDICATIONS:  1. Bactrim DS one tablet b.i.d. for 14 days total.  2. Albuterol inhaler p.r.n. for wheezing.   DISPOSITION AND FOLLOWUP:  Patient was to follow up with Dr. Roxan Hockey in  outpatient clinic within two weeks.  At the time of followup, please  check the patient's cellulitis to see if it is resolving as it was doing  so throughout the course of hospitalization.  He was sent home on  Bactrim Double-Strength one tablet b.i.d. for 14 days, however if the  patient's cellulitis has not completely resolved at time you see the  patient, you may wish to continue him on an extended course of the  Bactrim.   PROCEDURES:  1. September 02, 2006, a CT of the face showed previous resection of the      left submandibular gland with some adjacent strandiness, most      likely postoperative.  No definite abscess is seen.  2. Chest x-ray September 02, 2006:  Stable chest x-ray, no active lung      disease.  3. Ultrasound of the abdomen September 04, 2006 showed suspicion for mild      diffuse hepatocellular disease, e.g., fatty infiltration of the      liver.  No biliary  ductal dilatation.  Normal gallbladder.  4. Chest x-ray September 06, 2006 showed stable slight diffuse chronic      bronchitis, otherwise no active disease.   CONSULTATIONS:  None.   BRIEF ADMITTING HISTORY AND PHYSICAL:  This is a 40 year old man who  presents to the [facility] emergency department complaining of left-  sided facial pain and swelling which started around two days prior to  arrival.  The patient noticed a pimple on the septal surface just  inside the left nostril.  This popped at approximately 3:00 a.m. on day  of arrival and the pain awoke the patient from sleep.  Then at  approximately 7:00 a.m., the patient noticed increased left-sided facial  swelling and increasing pain, especially around the left nostril lesion  with radiation to the left buccal area.   There has been positive subjective fever and chills since the night  prior to arrival.  There is no chest pain or shortness of breath.  Positive productive cough over the past two weeks which was worse than  last night.  There is no ear pain currently, but he did have some one  week prior to arrival.  PATIENT IS ALLERGIC TO DARVOCET WHICH MAKES HIM  THROW UP and is on no medications.  The patient is a current smoker,  pack per day x21 years.  Patient lives in [place] with partner.   PHYSICAL EXAMINATION:  VITALS:  Temperature 97.0, blood pressure 144/86,  pulse 77, respiratory 12, satting 98% on room air.  GENERAL:  No acute distress, pleasant.  HEENT:  Eyes:  Extraocular muscles intact.  Pupils equally round and  reactive to light.  Anicteric.  No eye involvement in the inflammation.  ENT:  No thyromegaly.  Moist mucous membranes.  Positive right-sided  anterior cervical lymphadenopathy.  Left-sided tympanic membrane is  slightly distended although there is no erythema.  There are visible  left-sided upper and lower molar carries in the teeth.  NECK:  Supple, nontender, full range of motion.  RESPIRATORY:   Mild end-expiratory wheezes and crackles in the left  greater than right side.  Slightly dull to percussion in the bilateral  bases with the left extending approximately half the way up.  CARDIOVASCULAR:  Regular rate and rhythm without any murmur.  No JVD.  No carotid bruits.  No whispered pectoriloquy.  GI:  Soft and nondistended, with positive bowel sounds.  There is  positive right upper quadrant tenderness to palpation.  Murphy's sign is  negative.  No hepatosplenomegaly.  Positive bowel  sounds.  EXTREMITIES:  No edema.  SKIN:  Shows a left-sided buccal swelling compared to the right side.  There is positive tenderness to palpation, greatest around the opening  of the left nostril.  There are no visible exudates at the time.  Left  side slightly more warm than the right as well.  There is a pustule that  is approximately 1.1 cm visible inside the naris on the left septal  surface of the nostril.  The patient was able to express some material  from the pustule lesion which was subsequently cultured by the admitting  resident.  NEURO:  Cranial nerves II-XII are fully intact.  Strength is 5/5 in all  four extremities.  Sensation is intact in bilateral upper and lower  extremities to light touch.  He is alert and oriented x3/3.  Gait is not  tested.  Psych is appropriate.   INITIAL LABS:  Sodium 136, potassium 4.2, chloride 102, bicarb 25, BUN  13, creatinine 0.8, glucose 102, white count 10.3, hemoglobin 17.0,  platelets 253.  Hepatitis B surface antigen negative, hepatitis B core  antibody IgM negative, hepatitis C antibody negative.  Total bilirubin  1.1, alk phos 84, AST 40, ALT 82, protein 7.4, albumin 3.7, calcium 8.4.   HOSPITAL COURSE:  1. Left-sided facial cellulitis.  A CT scan of the head showed no      evidence of abscess.  As previously noted, the patient did have      resection of left submandibular gland in February of 2008; however,     this area did not appear to be  involved in the inflammation and      swelling, which appeared to be originating from the pustule lesion      inside the left nostril.  The patient did have some left buccal      area tenderness to palpation  and it was slightly warm.  Patient was      able to express some material from the pustule lesion which was      sent for culture and grew MRSA.  Initially, the patient was started      on vancomycin and Zosyn for empiric antibiotic therapy, and his      swelling and pain inproved.  He was later switched to p.o. Bactrim      when the culture grew MRSA sensitive to Bactrim.  The patient was      initially put on morphine for the pain on p.r.n. dosing, however      this did not seem to control his pain very well and he was switched      over to a morphine PCA low dose which did control his pain very      well after that.  The patient did not need any surgical intervention, and improved with  antibiotic therapy.  The plan will be for him to follow up in outpatient  clinic in approximately two weeks around the completion of the Bactrim  dosing and to determine if the area is completely healed or if extended  Bactrim regimen would be required.   1. HIV.  Patient's CD4 count was 740 on September 04, 2006.  Dr. Roxan Hockey      manages his HIV, and patient is currently not on antiretroviral      therapy.   1. Cough.  Patient did have a cough present for the past approximately      two weeks, worse on the day prior to arrival.  A sputum culture,      blood cultures, and chest x-ray were negative.  He was treated      symptomatically for cough.  Eventually, the patient's cough      completely resolved and the patient was doing well with albuterol      inhaler as needed for wheezing.  He was sent home on albuterol      inhaler.   1. Increased ALT.  The patient did have increased ALT to AST ratio      greater than 2.  An acute hepatitis panel was negative, and a right      upper quadrant  ultrasound showed only evidence of fatty      infiltration of the liver.  His LFTs (which were largely unchanged      from the previous check in the outpatient clinic) should be      monitored as an outpatient.   1. Status post left submandibular resection.  ENT was notified of the      patient's admission but did not see the patient in hospital.   DISCHARGE DAY LABS AND VITALS:  Temperature is 98.8, blood pressure  116/66, pulse 86, respiratory 20, oxygen saturation 99% on room air.   White count 5.2, hemoglobin 14.1, platelets 254, sodium 135, potassium  4.0, chloride 104, bicarb 29, BUN 6, creatinine 0.7, glucose 109.      Thereasa Solo, M.D.  Electronically Signed      Ileana Roup, M.D.  Electronically Signed    AS/MEDQ  D:  09/19/2006  T:  09/19/2006  Job:  [id number]

## 2010-10-29 NOTE — Discharge Summary (Signed)
NAMEFADY, STAMPS                ACCOUNT NO.:  [id number]   MEDICAL RECORD NO.:  [id number]          PATIENT TYPE:  INP   LOCATION:  5709                         FACILITY:  [facility]   PHYSICIAN:  Jefry H. Pollyann Kennedy, MD     DATE OF BIRTH:  September 22, 1970   DATE OF ADMISSION:  05/13/2007  DATE OF DISCHARGE:  05/18/2007                               DISCHARGE SUMMARY   ADMISSION DIAGNOSIS:  Neck abscess, recurrent.   DISCHARGE DIAGNOSIS:  Status post incision and drainage of neck abscess  with removal of submandibular stone.   PROCEDURE:  Incision and drainage of neck abscess with removal of  submandibular stone.   HOSPITAL COURSE:  This is a 40 year old who was admitted by my partner,  Dr. Gerilyn Pilgrim with a 2 day history of increasing neck swelling and pain.  He  was found to have submandibular abscess on CT scan.  He underwent the  incision and drainage, and a large stone was identified.  He had  problems with this on a recurrent basis since removal of the gland last  year.  It was felt that after the stone was ultimately found, that the  problem would be solved.  He had a drain in place for couple of days  postoperatively.  These were removed prior to discharge.  He was sent  home on oral antibiotics and instructed to followup in 1 week.      Jefry H. Pollyann Kennedy, MD  Electronically Signed     JHR/MEDQ  D:  07/25/2007  T:  07/26/2007  Job:  [id number]

## 2010-10-29 NOTE — Op Note (Signed)
NAMETHURLOW, GALLAGA                ACCOUNT NO.:  [id number]   MEDICAL RECORD NO.:  [id number]          PATIENT TYPE:  OIB   LOCATION:  5707                         FACILITY:  [facility]   PHYSICIAN:  Jefry H. Pollyann Kennedy, MD     DATE OF BIRTH:  30-Jul-1970   DATE OF PROCEDURE:  08/01/2006  DATE OF DISCHARGE:  08/01/2006                               OPERATIVE REPORT   PREOPERATIVE DIAGNOSIS:  Left submandibular sialolithiasis and chronic  sialadenitis.   POSTOPERATIVE DIAGNOSIS:  Left submandibular sialolithiasis and chronic  sialadenitis.   PROCEDURE:  Excision of left submandibular gland.   SURGEON:  Jefry H. Pollyann Kennedy, MD   ANESTHESIA:  General endotracheal anesthesia was used.   COMPLICATIONS:  No complications.   BLOOD LOSS:  Minimal.   FINDINGS:  Very firm, indurated and edematous left submandibular gland  particularly around the hilum.  Some purulent secretions were reduced  and the duct was divided.  No complications.  The patient tolerated  procedure well.   HISTORY:  40 year old was in our office a couple weeks back with the  onset of submandibular swelling and pain every time he ate.  CT imaging  revealed several stones including a large one at the hilum.  Attempted  intraoral extraction of the stones was attempted and I was not able to  reach all the way back to the hilum to remove the majority stone.  Risks, benefits, alternatives, complications of procedure explained to  the patient, who seemed to understand and agreed to surgery.   PROCEDURE:  The patient was taken to the operating room, placed the  operating table in supine position.  Following induction of general  endotracheal anesthesia, the left neck was prepped and draped in  standard fashion.  A marking pen was used to outline the proposed  incision about two fingerbreadths below the angle of mandible.  Electrocautery was used to incise the skin, subcutaneous tissue and down  through the platysma layer.   Subplatysmal flap was elevated superiorly  up to the mandible.  The marginal branch of the facial nerve was  identified and dissected and retracted superiorly.  The fibrofatty  tissue just below the mandible was dissected and the facial vessels were  ligated between clamps and divided.  The anterior belly of the digastric  and the mylohyoid muscle were exposed and reflected superiorly and  anteriorly.  The hypoglossal nerve was identified and preserved.  The  lingual nerve could not be identified because of the diffuse  inflammatory tissue in this area.  The duct was ligated between clamps  and divided and some purulent secretions were obtained during this.  The  facial artery was then identified and posteroinferiorly, ligated and  divided.  The surrounding fascia was divided and the submandibular gland  several small and several lymph nodes were sent for pathologic  evaluation.  Wound was irrigated.  Hemostasis was completed using silk  ties and bipolar cautery as needed.  The wound was closed in layers  using 3-0 chromic on the  platysma layer and subcutaneous layer and Dermabond on the skin.  A 7-  [place] round JP drain was left in the wound exited through the posterior  aspect of the incision and secured in place with a chromic suture.  The  patient was then awakened, extubated, transferred to recovery in stable  condition.      Jefry H. Pollyann Kennedy, MD  Electronically Signed     JHR/MEDQ  D:  08/01/2006  T:  08/01/2006  Job:  [id number]

## 2010-10-29 NOTE — Op Note (Signed)
[name], [name]                ACCOUNT NO.:  [id number]   MEDICAL RECORD NO.:  [id number]          PATIENT TYPE:  INP   LOCATION:  5736                         FACILITY:  [facility]   PHYSICIAN:  Kinnie Scales. Annalee Genta, M.D.DATE OF BIRTH:  1970-06-30   DATE OF PROCEDURE:  10/15/2006  DATE OF DISCHARGE:                               OPERATIVE REPORT   PREOPERATIVE/POSTOPERATIVE DIAGNOSES AND INDICATIONS FOR SURGERY:  1. Left submandibular abscess.  2. Status post excision, left submandibular gland.   SURGICAL PROCEDURE:  Incision and drainage, left submandibular abscess.   SURGEON:  Kinnie Scales. Annalee Genta, M.D.   COMPLICATIONS:  None.   ESTIMATED BLOOD LOSS:  Less than 50 mL.  Patient transferred from the  operating room to the recovery room in stable condition.   BRIEF HISTORY:  [name] is a 40 year old white male who underwent  excision of a left submandibular gland for chronic sialolithiasis  performed approximately 6 weeks ago by Dr. Serena Colonel.  The patient was  stable and doing well after his surgical procedure until approximately 5  days ago when he began to develop pain and mild swelling along the  surgical incision site.  He was seen by Dr. Pollyann Kennedy and started on oral  Avalox for antibiotic therapy for possible early wound infection.  The  patient had progressive symptoms and over the next 3-4 days developed  low-grade fever, increasing pain and swelling in the left submandibular  space.  He presented to the [facility] Emergency Department  late in the evening on Oct 14, 2006.  CT scan was performed that night  and the patient was found to have a presumed abscess in the left  submandibular space.  He also had a recent history of MRSA infection  involving the facial skin and is HIV positive with presumed reduced  immuno competency.  Given the patient's history, examination and  findings, we opted to initiate antibiotic therapy.  He was treated with  Rocephin and started  on a vancomycin treatment protocol for possible  MRSA and was scheduled for surgical procedure the following morning for  incision and drainage of the left submandibular abscess.  Risks,  benefits and possible complications of the surgical procedure were  discussed in detail with the patient prior to surgery and he understood  and concurred with our plan for surgery, which was scheduled as above.   PROCEDURE:  The patient was brought to the operating room at [facility] Main OR on Oct 15, 2006 and placed in the supine position on the  operating table and general endotracheal anesthesia was established  without difficulty.  When the patient was adequately anesthetized he was  positioned on the operating table and prepped and draped in a sterile  fashion.  He was injected with 2 mL of 1% Lidocaine 1/100,000 solution  of epinephrine in the proposed skin incision site, which was in the  middle of the scar from his previous submandibular gland excision.  After allowing adequate time for vasoconstriction, procedure was begun  by creating a 1 cm horizontally oriented incision in the pre-existing  scar.  This was carried through the skin and underlying deep  subcutaneous tissue to the level of the platysma.  Gentle palpation with  a hemostat allowed access to the abscess cavity and a significant  amount, approximately 50 mL, of purulent material was expressed from the  wound.  The patient's neck was then thoroughly irrigated with sterile  saline.  There was minimal bleeding and no further pus.  Gentle finger  dissection was then carried out and no further pus pockets were  identified.  A 1/4-inch Penrose drain was placed at the depth of the  abscess cavity and brought out through the incision.  This was sutured  to the skin with a 3-0 Ethilon suture.  The  patient's wound was then dressed with a combination of 4x4 gauze fluff  and a 4-inch Kerlix wrap.  The patient was then awakened from  his  anesthetic, extubated and transferred from the operating room to the  recovery room in stable condition.  No complications.  Blood loss  approximately 50 mL.           ______________________________  Kinnie Scales. Annalee Genta, M.D.     DLS/MEDQ  D:  16/03/9603  T:  10/15/2006  Job:  [id number]

## 2010-11-22 ENCOUNTER — Telehealth: Payer: Self-pay | Admitting: *Deleted

## 2010-11-22 DIAGNOSIS — M549 Dorsalgia, unspecified: Secondary | ICD-10-CM

## 2010-11-22 MED ORDER — MORPHINE SULFATE CR 30 MG PO TB12: 30.0000 mg | ORAL_TABLET | Freq: Two times a day (BID) | ORAL | Status: DC

## 2010-11-22 MED ORDER — OXYCODONE-ACETAMINOPHEN 7.5-325 MG PO TABS: 1.0000 | ORAL_TABLET | Freq: Four times a day (QID) | ORAL | Status: DC | PRN

## 2010-11-25 ENCOUNTER — Other Ambulatory Visit: Payer: Self-pay | Admitting: *Deleted

## 2010-11-25 DIAGNOSIS — M549 Dorsalgia, unspecified: Secondary | ICD-10-CM

## 2010-11-25 MED ORDER — MORPHINE SULFATE CR 30 MG PO TB12: 30.0000 mg | ORAL_TABLET | Freq: Two times a day (BID) | ORAL | Status: DC

## 2010-11-25 MED ORDER — OXYCODONE-ACETAMINOPHEN 7.5-325 MG PO TABS: 1.0000 | ORAL_TABLET | Freq: Four times a day (QID) | ORAL | Status: DC | PRN

## 2010-11-29 ENCOUNTER — Emergency Department (HOSPITAL_COMMUNITY)
Admission: EM | Admit: 2010-11-29 | Discharge: 2010-11-30 | Disposition: A | Discharge status: Home / Self Care | Payer: Medicaid Other | Attending: Emergency Medicine | Admitting: Emergency Medicine

## 2010-11-29 ENCOUNTER — Emergency Department (HOSPITAL_COMMUNITY): Payer: Medicaid Other

## 2010-11-29 DIAGNOSIS — F17200 Nicotine dependence, unspecified, uncomplicated: Secondary | ICD-10-CM | POA: Insufficient documentation

## 2010-11-29 DIAGNOSIS — I10 Essential (primary) hypertension: Secondary | ICD-10-CM | POA: Insufficient documentation

## 2010-11-29 DIAGNOSIS — E119 Type 2 diabetes mellitus without complications: Secondary | ICD-10-CM | POA: Insufficient documentation

## 2010-11-29 DIAGNOSIS — J45909 Unspecified asthma, uncomplicated: Secondary | ICD-10-CM | POA: Insufficient documentation

## 2010-11-29 DIAGNOSIS — L0231 Cutaneous abscess of buttock: Secondary | ICD-10-CM | POA: Insufficient documentation

## 2010-11-29 DIAGNOSIS — Z21 Asymptomatic human immunodeficiency virus [HIV] infection status: Secondary | ICD-10-CM | POA: Insufficient documentation

## 2010-11-29 HISTORY — DX: Essential (primary) hypertension: I10

## 2010-11-29 LAB — DIFFERENTIAL
Lymphocytes Relative: 22 % (ref 12–46)
Lymphs Abs: 3.6 10*3/uL (ref 0.7–4.0)
Monocytes Relative: 13 % — ABNORMAL HIGH (ref 3–12)
Neutro Abs: 10.2 10*3/uL — ABNORMAL HIGH (ref 1.7–7.7)
Neutrophils Relative %: 63 % (ref 43–77)

## 2010-11-29 LAB — CBC
HCT: 42.6 % (ref 39.0–52.0)
Hemoglobin: 15.3 g/dL (ref 13.0–17.0)
MCH: 33.5 pg (ref 26.0–34.0)
MCV: 93.2 fL (ref 78.0–100.0)
RBC: 4.57 MIL/uL (ref 4.22–5.81)

## 2010-11-29 LAB — BASIC METABOLIC PANEL
CO2: 30 mEq/L (ref 19–32)
Calcium: 9 mg/dL (ref 8.4–10.5)
Creatinine, Ser: 0.71 mg/dL (ref 0.50–1.35)
Glucose, Bld: 98 mg/dL (ref 70–99)
Sodium: 140 mEq/L (ref 135–145)

## 2010-11-30 ENCOUNTER — Encounter (HOSPITAL_COMMUNITY): Payer: Self-pay | Admitting: Radiology

## 2010-11-30 MED ORDER — IOHEXOL 300 MG/ML  SOLN
100.0000 mL | Freq: Once | INTRAMUSCULAR | Status: AC | PRN
Start: 2010-11-30 — End: 2010-11-30
  Administered 2010-11-30: 100 mL via INTRAVENOUS

## 2010-12-01 ENCOUNTER — Inpatient Hospital Stay (INDEPENDENT_AMBULATORY_CARE_PROVIDER_SITE_OTHER)
Admission: RE | Admit: 2010-12-01 | Discharge: 2010-12-01 | Disposition: A | Discharge status: Home / Self Care | Payer: Medicaid Other | Source: Ambulatory Visit | Attending: Family Medicine | Admitting: Family Medicine

## 2010-12-01 DIAGNOSIS — L0231 Cutaneous abscess of buttock: Secondary | ICD-10-CM

## 2010-12-21 ENCOUNTER — Encounter: Payer: Self-pay | Admitting: *Deleted

## 2010-12-21 ENCOUNTER — Ambulatory Visit (INDEPENDENT_AMBULATORY_CARE_PROVIDER_SITE_OTHER): Payer: Medicaid Other | Admitting: *Deleted

## 2010-12-21 VITALS — BP 126/84 | HR 64 | Temp 98.4°F | Resp 16 | Wt 276.8 lb

## 2010-12-21 DIAGNOSIS — B20 Human immunodeficiency virus [HIV] disease: Secondary | ICD-10-CM

## 2010-12-21 LAB — LIPID PANEL
Cholesterol: 164 mg/dL (ref 0–200)
HDL: 35 mg/dL — ABNORMAL LOW (ref >39)
LDL Cholesterol: 79 mg/dL (ref 0–99)
Triglycerides: 250 mg/dL — ABNORMAL HIGH (ref <150)

## 2010-12-21 LAB — COMPREHENSIVE METABOLIC PANEL
ALT: 39 U/L (ref 0–53)
BUN: 11 mg/dL (ref 6–23)
CO2: 26 mEq/L (ref 19–32)
Calcium: 9.5 mg/dL (ref 8.4–10.5)
Creat: 0.72 mg/dL (ref 0.50–1.35)
Glucose, Bld: 105 mg/dL — ABNORMAL HIGH (ref 70–99)
Total Bilirubin: 1.9 mg/dL — ABNORMAL HIGH (ref 0.3–1.2)

## 2010-12-21 NOTE — Progress Notes (Signed)
[name] is here for her week 112 study visit. She has a lesion coming up in her left naries and has some under her left arm that are red, sore and tender. She went to the ED in June for some rectal lesions and they gave her doxycycline for 5 days that did not help any. She is not currently taking any antibiotics. She says that the doxy and bactrim do not seem to help her outbreaks. She is to return in October for her next visit. I told her I would check to with Dr. [name] about antibiotics and would let her know if he needed to see her.

## 2010-12-22 LAB — PHOSPHORUS: Phosphorus: 2.7 mg/dL (ref 2.3–4.6)

## 2010-12-27 ENCOUNTER — Other Ambulatory Visit: Payer: Self-pay | Admitting: *Deleted

## 2010-12-27 DIAGNOSIS — M549 Dorsalgia, unspecified: Secondary | ICD-10-CM

## 2010-12-27 MED ORDER — OXYCODONE-ACETAMINOPHEN 7.5-325 MG PO TABS: 1.0000 | ORAL_TABLET | Freq: Four times a day (QID) | ORAL | Status: DC | PRN

## 2010-12-27 MED ORDER — MORPHINE SULFATE CR 30 MG PO TB12: 30.0000 mg | ORAL_TABLET | Freq: Two times a day (BID) | ORAL | Status: DC

## 2010-12-28 ENCOUNTER — Other Ambulatory Visit: Payer: Self-pay | Admitting: *Deleted

## 2010-12-28 DIAGNOSIS — F419 Anxiety disorder, unspecified: Secondary | ICD-10-CM

## 2010-12-28 DIAGNOSIS — B20 Human immunodeficiency virus [HIV] disease: Secondary | ICD-10-CM

## 2010-12-28 MED ORDER — METFORMIN HCL 500 MG PO TABS: 500.0000 mg | ORAL_TABLET | Freq: Every day | ORAL | Status: DC

## 2010-12-28 MED ORDER — ALPRAZOLAM 2 MG PO TABS: 2.0000 mg | ORAL_TABLET | Freq: Two times a day (BID) | ORAL | Status: DC | PRN

## 2011-01-21 ENCOUNTER — Encounter: Payer: Self-pay | Admitting: Infectious Disease

## 2011-01-21 LAB — CD4/CD8 (T-HELPER/T-SUPPRESSOR CELL)
CD4%: 41.9
CD4: 1383
CD8 % Suppressor T Cell: 38
CD8: 1254

## 2011-01-26 ENCOUNTER — Other Ambulatory Visit: Payer: Self-pay | Admitting: Licensed Clinical Social Worker

## 2011-01-26 ENCOUNTER — Other Ambulatory Visit: Payer: Self-pay | Admitting: *Deleted

## 2011-01-26 DIAGNOSIS — M549 Dorsalgia, unspecified: Secondary | ICD-10-CM

## 2011-01-26 MED ORDER — OXYCODONE-ACETAMINOPHEN 7.5-325 MG PO TABS: 1.0000 | ORAL_TABLET | Freq: Four times a day (QID) | ORAL | Status: DC | PRN

## 2011-01-26 MED ORDER — MORPHINE SULFATE CR 30 MG PO TB12: 30.0000 mg | ORAL_TABLET | Freq: Two times a day (BID) | ORAL | Status: DC

## 2011-02-23 ENCOUNTER — Other Ambulatory Visit: Payer: Self-pay | Admitting: *Deleted

## 2011-02-23 DIAGNOSIS — M549 Dorsalgia, unspecified: Secondary | ICD-10-CM

## 2011-02-23 MED ORDER — OXYCODONE-ACETAMINOPHEN 7.5-325 MG PO TABS: 1.0000 | ORAL_TABLET | Freq: Four times a day (QID) | ORAL | Status: DC | PRN

## 2011-02-23 MED ORDER — MORPHINE SULFATE CR 30 MG PO TB12: 30.0000 mg | ORAL_TABLET | Freq: Two times a day (BID) | ORAL | Status: DC

## 2011-02-23 NOTE — Telephone Encounter (Signed)
Printed rxes for MD signature. Jennet Maduro, RN

## 2011-03-02 LAB — T-HELPER CELL (CD4) - (RCID CLINIC ONLY): CD4 T Cell Abs: 1050

## 2011-03-07 ENCOUNTER — Encounter: Payer: Self-pay | Admitting: Infectious Disease

## 2011-03-07 ENCOUNTER — Ambulatory Visit (INDEPENDENT_AMBULATORY_CARE_PROVIDER_SITE_OTHER): Payer: Medicaid Other | Admitting: Infectious Disease

## 2011-03-07 VITALS — BP 144/90 | HR 92 | Temp 98.2°F | Ht 71.0 in | Wt 270.0 lb

## 2011-03-07 DIAGNOSIS — F17200 Nicotine dependence, unspecified, uncomplicated: Secondary | ICD-10-CM | POA: Insufficient documentation

## 2011-03-07 DIAGNOSIS — G894 Chronic pain syndrome: Secondary | ICD-10-CM

## 2011-03-07 DIAGNOSIS — L0292 Furuncle, unspecified: Secondary | ICD-10-CM

## 2011-03-07 DIAGNOSIS — B20 Human immunodeficiency virus [HIV] disease: Secondary | ICD-10-CM

## 2011-03-07 DIAGNOSIS — F640 Transsexualism: Secondary | ICD-10-CM

## 2011-03-07 DIAGNOSIS — M549 Dorsalgia, unspecified: Secondary | ICD-10-CM

## 2011-03-07 DIAGNOSIS — L732 Hidradenitis suppurativa: Secondary | ICD-10-CM

## 2011-03-07 DIAGNOSIS — Z23 Encounter for immunization: Secondary | ICD-10-CM

## 2011-03-07 DIAGNOSIS — M542 Cervicalgia: Secondary | ICD-10-CM

## 2011-03-07 LAB — DIFFERENTIAL
Eosinophils Absolute: 0.1
Eosinophils Relative: 1
Lymphs Abs: 3.4
Monocytes Absolute: 1
Monocytes Relative: 9

## 2011-03-07 LAB — COMPREHENSIVE METABOLIC PANEL
ALT: 76 — ABNORMAL HIGH
AST: 46 — ABNORMAL HIGH
AST: 53 — ABNORMAL HIGH
Albumin: 4.2
Albumin: 3.7
Calcium: 9.2
Calcium: 9
Chloride: 105
Creatinine, Ser: 0.85
GFR calc Af Amer: >60
GFR calc Af Amer: >60
Potassium: 3.9
Sodium: 139
Total Bilirubin: 1
Total Protein: 7.6

## 2011-03-07 LAB — RAPID URINE DRUG SCREEN, HOSP PERFORMED
Amphetamines: NOT DETECTED
Benzodiazepines: NOT DETECTED
Cocaine: NOT DETECTED
Tetrahydrocannabinol: POSITIVE — AB

## 2011-03-07 LAB — URINALYSIS, ROUTINE W REFLEX MICROSCOPIC
Glucose, UA: NEGATIVE
Ketones, ur: NEGATIVE
Leukocytes, UA: NEGATIVE
Protein, ur: NEGATIVE
Urobilinogen, UA: 0.2

## 2011-03-07 LAB — CBC
MCHC: 35.8
MCV: 91.5
Platelets: 208
RDW: 12.5
WBC: 7.4

## 2011-03-07 LAB — URINE MICROSCOPIC-ADD ON

## 2011-03-07 LAB — ETHANOL: Alcohol, Ethyl (B): <5

## 2011-03-07 MED ORDER — NICOTINE 7 MG/24HR TD PT24: 1.0000 | MEDICATED_PATCH | TRANSDERMAL | Status: AC

## 2011-03-07 MED ORDER — MORPHINE SULFATE CR 30 MG PO TB12: 30.0000 mg | ORAL_TABLET | Freq: Two times a day (BID) | ORAL | Status: DC

## 2011-03-07 MED ORDER — NICOTINE 14 MG/24HR TD PT24: 1.0000 | MEDICATED_PATCH | TRANSDERMAL | Status: AC

## 2011-03-07 NOTE — Assessment & Plan Note (Signed)
-   rx nicotine patch

## 2011-03-07 NOTE — Assessment & Plan Note (Signed)
Not flaring especially at present. Continue aldactone.

## 2011-03-07 NOTE — Progress Notes (Signed)
Subjective:    Patient ID: [name], male    DOB: 1970/08/14, 40 y.o.   MRN: [id number]  HPI  40 year old transgender male with HIV very well controlled comes in for clinic today with several concerns.  #1 Pain is not well controlled despite her increasing the ms contin dose in the evening. She had requested more oxycodone because this "works better for me" and doesn't make me itch as much"  #2 She requested to undergo bilateral orchiectomy to a) help with transgender and b0 to help with hidradenitis suppurativa. She also requested injection of depo-provera for feminizing properites but I have refused to do this with active smoking  #3 Smoking: she has cut down to 5 cigaretteds per day. Chantix made her"crazy."  #4 Concern for furunle inside nostril  #5 concern for painful lump in neck that has come up over the past week.  # 6 smptoms of night sweats for several weeks to months  Review of Systems  Constitutional: Negative for fever, chills, diaphoresis, activity change, appetite change, fatigue and unexpected weight change.  HENT: Positive for facial swelling, rhinorrhea and neck pain. Negative for congestion, sore throat, sneezing, trouble swallowing and sinus pressure.   Eyes: Negative for photophobia and visual disturbance.  Respiratory: Negative for cough, chest tightness, shortness of breath, wheezing and stridor.   Cardiovascular: Negative for chest pain, palpitations and leg swelling.  Gastrointestinal: Negative for nausea, vomiting, abdominal pain, diarrhea, constipation, blood in stool, abdominal distention and anal bleeding.  Genitourinary: Negative for dysuria, hematuria, flank pain and difficulty urinating.  Musculoskeletal: Positive for back pain. Negative for myalgias, joint swelling, arthralgias and gait problem.  Skin: Positive for rash. Negative for color change, pallor and wound.  Neurological: Negative for dizziness, tremors, weakness and light-headedness.    Hematological: Negative for adenopathy. Does not bruise/bleed easily.  Psychiatric/Behavioral: Negative for behavioral problems, confusion, sleep disturbance, dysphoric mood, decreased concentration and agitation.       Objective:   Physical Exam  Constitutional: He is oriented to person, place, and time. He appears well-developed and well-nourished. No distress.  HENT:  Head: Normocephalic and atraumatic.    Nose: Mucosal edema present.  Mouth/Throat: Oropharynx is clear and moist. No oropharyngeal exudate.       Exquisitely tender soft tissue in the right neck. ? Tender lymph node, nostirl with infected follcile  Eyes: Conjunctivae and EOM are normal. Pupils are equal, round, and reactive to light. No scleral icterus.  Neck: Normal range of motion. Neck supple. No JVD present.  Cardiovascular: Normal rate, regular rhythm and normal heart sounds.  Exam reveals no gallop and no friction rub.   No murmur heard. Pulmonary/Chest: Effort normal and breath sounds normal. No respiratory distress. He has no wheezes. He has no rales. He exhibits no tenderness.  Abdominal: He exhibits no distension and no mass. There is no tenderness. There is no rebound and no guarding.  Musculoskeletal: He exhibits no edema and no tenderness.  Lymphadenopathy:    He has no cervical adenopathy.  Neurological: He is alert and oriented to person, place, and time. He has normal reflexes. He exhibits normal muscle tone. Coordination normal.  Skin: Skin is warm and dry. He is not diaphoretic. No pallor.  Psychiatric: He has a normal mood and affect. His behavior is normal. Judgment and thought content normal.          Assessment & Plan:  HIV DISEASE Continue reyataz, norvir and truvada  GENDER IDENTITY DISORDER Will refer for  orchiectomy per patient request and indeed it might help with her hidradenitis. I will not rx feminizing hormones yet however until smoking has been stopped  CHRONIC PAIN  SYNDROME I have increased the MS contin. I am a bit perturbed by the request for more oxycodone. She has signed a pain contract  Smoker rx nicotine patch  Cervicalgia I will get a CT of teh neck given the exquisite tenderness on exam  HIDRADENITIS SUPPURATIVA Not flaring especially at present. Continue aldactone.   FURUNCLE intransal lesion can be treated with topical mupirocin

## 2011-03-07 NOTE — Assessment & Plan Note (Signed)
Will refer for orchiectomy per patient request and indeed it might help with her hidradenitis. I will not rx feminizing hormones yet however until smoking has been stopped

## 2011-03-07 NOTE — Assessment & Plan Note (Signed)
I have increased the MS contin. I am a bit perturbed by the request for more oxycodone. She has signed a pain contract

## 2011-03-07 NOTE — Assessment & Plan Note (Signed)
intransal lesion can be treated with topical mupirocin

## 2011-03-07 NOTE — Assessment & Plan Note (Signed)
Continue reyataz, norvir and truvada 

## 2011-03-07 NOTE — Assessment & Plan Note (Signed)
I will get a CT of teh neck given the exquisite tenderness on exam

## 2011-03-08 ENCOUNTER — Telehealth: Payer: Self-pay | Admitting: Licensed Clinical Social Worker

## 2011-03-08 LAB — DIFFERENTIAL
Basophils Absolute: 0.1
Basophils Relative: 1
Eosinophils Absolute: 0.1
Eosinophils Relative: 1
Lymphocytes Relative: 27
Lymphs Abs: 3
Monocytes Absolute: 0.9
Monocytes Relative: 9
Neutro Abs: 6.6
Neutrophils Relative %: 62

## 2011-03-08 LAB — CBC
HCT: 44.7
Hemoglobin: 15.9
MCHC: 35.5
MCV: 92.1
Platelets: 287
RBC: 4.85
RDW: 12.5
WBC: 10.8 — ABNORMAL HIGH

## 2011-03-08 LAB — POCT I-STAT, CHEM 8
Calcium, Ion: 1.13
Glucose, Bld: 138 — ABNORMAL HIGH
HCT: 45
Hemoglobin: 15.3

## 2011-03-08 NOTE — Telephone Encounter (Signed)
No surprise pt will need referral to tertiary care ceenter

## 2011-03-08 NOTE — Telephone Encounter (Signed)
I called [facility] and they stated that they do not perform the procedure of scrotum removal, and will not consult the patient.

## 2011-03-15 ENCOUNTER — Other Ambulatory Visit: Payer: Self-pay | Admitting: Licensed Clinical Social Worker

## 2011-03-15 DIAGNOSIS — B20 Human immunodeficiency virus [HIV] disease: Secondary | ICD-10-CM

## 2011-03-15 DIAGNOSIS — F419 Anxiety disorder, unspecified: Secondary | ICD-10-CM

## 2011-03-15 LAB — BASIC METABOLIC PANEL
BUN: 6
Calcium: 8.9
Creatinine, Ser: 0.71
GFR calc non Af Amer: >60
Glucose, Bld: 100 — ABNORMAL HIGH

## 2011-03-15 LAB — CBC
HCT: 44.7
HCT: 46.5
MCHC: 34.5
MCV: 94.2
MCV: 94.3
Platelets: 204
RBC: 4.75
RBC: 4.93
RDW: 12.6
WBC: 7.8
WBC: 7.9

## 2011-03-15 LAB — COMPREHENSIVE METABOLIC PANEL
ALT: 99 — ABNORMAL HIGH
Alkaline Phosphatase: 67
BUN: 6
CO2: 27
Chloride: 105
GFR calc non Af Amer: >60
Glucose, Bld: 90
Potassium: 4.2
Total Bilirubin: 0.5
Total Protein: 6.5

## 2011-03-15 LAB — DIFFERENTIAL
Eosinophils Absolute: 0.1
Eosinophils Relative: 2
Lymphocytes Relative: 34
Lymphs Abs: 2.7
Monocytes Relative: 11
Neutrophils Relative %: 53

## 2011-03-15 LAB — HIV-1 RNA QUANT-NO REFLEX-BLD
HIV 1 RNA Quant: 4500 copies/mL — ABNORMAL HIGH (ref <48)
HIV-1 RNA Quant, Log: 3.65 — ABNORMAL HIGH (ref <1.68)

## 2011-03-15 LAB — T-HELPER CELLS (CD4) COUNT (NOT AT ARMC): CD4 T Cell Abs: 910

## 2011-03-15 MED ORDER — ALPRAZOLAM 2 MG PO TABS: 2.0000 mg | ORAL_TABLET | Freq: Two times a day (BID) | ORAL | Status: DC | PRN

## 2011-03-15 MED ORDER — OMEPRAZOLE 20 MG PO CPDR: 20.0000 mg | DELAYED_RELEASE_CAPSULE | Freq: Every day | ORAL | Status: DC

## 2011-03-15 NOTE — Progress Notes (Signed)
Addended by: Starleen Arms D on: 03/15/2011 03:07 PM   Modules accepted: Orders

## 2011-03-18 ENCOUNTER — Other Ambulatory Visit (HOSPITAL_COMMUNITY): Payer: Medicaid Other

## 2011-03-21 LAB — GRAM STAIN

## 2011-03-21 LAB — ANAEROBIC CULTURE

## 2011-03-21 LAB — CULTURE, ROUTINE-ABSCESS

## 2011-03-22 LAB — CBC
HCT: 43.3
HCT: 42.5
Hemoglobin: 15.2
Hemoglobin: 15
MCHC: 35
MCV: 91.7
MCV: 92.5
Platelets: 219
Platelets: 263
RDW: 12.4
RDW: 12.6
WBC: 13.4 — ABNORMAL HIGH

## 2011-03-22 LAB — DIFFERENTIAL
Basophils Relative: 0
Eosinophils Absolute: 0.1 — ABNORMAL LOW
Eosinophils Relative: 1
Lymphocytes Relative: 24
Lymphocytes Relative: 24
Lymphs Abs: 3.2
Monocytes Absolute: 1.6 — ABNORMAL HIGH
Monocytes Absolute: 1.7 — ABNORMAL HIGH
Monocytes Relative: 15 — ABNORMAL HIGH
Neutro Abs: 6.5
Neutrophils Relative %: 60

## 2011-03-22 LAB — COMPREHENSIVE METABOLIC PANEL
Albumin: 3.7
BUN: 12
Calcium: 9.1
Creatinine, Ser: 0.62
Glucose, Bld: 86
Total Protein: 7

## 2011-03-22 LAB — BASIC METABOLIC PANEL
BUN: 10
Chloride: 107
GFR calc non Af Amer: >60
Glucose, Bld: 90
Potassium: 3.5
Sodium: 140

## 2011-03-25 ENCOUNTER — Other Ambulatory Visit: Payer: Self-pay | Admitting: Licensed Clinical Social Worker

## 2011-03-25 DIAGNOSIS — M549 Dorsalgia, unspecified: Secondary | ICD-10-CM

## 2011-03-25 LAB — T-HELPER CELL (CD4) - (RCID CLINIC ONLY): CD4 T Cell Abs: 900

## 2011-03-25 MED ORDER — OXYCODONE-ACETAMINOPHEN 7.5-325 MG PO TABS: 1.0000 | ORAL_TABLET | Freq: Four times a day (QID) | ORAL | Status: DC | PRN

## 2011-03-28 LAB — DIFFERENTIAL
Basophils Absolute: 0.1
Basophils Relative: 0
Basophils Relative: 1
Eosinophils Absolute: 0.1
Eosinophils Absolute: 0.2
Eosinophils Relative: 2
Lymphs Abs: 3
Monocytes Absolute: 1.4 — ABNORMAL HIGH
Monocytes Relative: 14 — ABNORMAL HIGH
Neutro Abs: 5.2
Neutrophils Relative %: 54

## 2011-03-28 LAB — COMPREHENSIVE METABOLIC PANEL
ALT: 95 — ABNORMAL HIGH
Alkaline Phosphatase: 87
BUN: 8
CO2: 29
Calcium: 8.7
GFR calc non Af Amer: >60
Glucose, Bld: 93
Sodium: 139

## 2011-03-28 LAB — CBC
HCT: 42.4
Hemoglobin: 14.6
Hemoglobin: 15.2
MCHC: 34.4
MCHC: 35.2
MCV: 90.9
RBC: 4.58
RBC: 4.76
RDW: 12.8

## 2011-03-28 LAB — I-STAT 8, (EC8 V) (CONVERTED LAB)
Bicarbonate: 26 — ABNORMAL HIGH
Glucose, Bld: 89
HCT: 47
TCO2: 27
pCO2, Ven: 44.1 — ABNORMAL LOW
pH, Ven: 7.379 — ABNORMAL HIGH

## 2011-03-28 LAB — POCT I-STAT CREATININE: Operator id: 151321

## 2011-03-28 LAB — CULTURE, ROUTINE-ABSCESS

## 2011-03-30 LAB — DIFFERENTIAL
Basophils Absolute: 0
Basophils Relative: 0
Eosinophils Absolute: 0.1
Eosinophils Relative: 1

## 2011-03-30 LAB — CBC
HCT: 44
MCV: 90.7
Platelets: 290
RDW: 13.1

## 2011-03-30 LAB — I-STAT 8, (EC8 V) (CONVERTED LAB)
Bicarbonate: 23.6
Glucose, Bld: 93
TCO2: 25
pH, Ven: 7.37 — ABNORMAL HIGH

## 2011-03-30 LAB — CULTURE, ROUTINE-ABSCESS

## 2011-03-30 LAB — POCT I-STAT CREATININE: Operator id: 257131

## 2011-03-30 LAB — ANAEROBIC CULTURE

## 2011-03-31 LAB — COMPREHENSIVE METABOLIC PANEL
ALT: 50
BUN: 5 — ABNORMAL LOW
Calcium: 8.6
Creatinine, Ser: 0.71
Glucose, Bld: 95
Sodium: 139
Total Protein: 7.1

## 2011-03-31 LAB — URINALYSIS, ROUTINE W REFLEX MICROSCOPIC
Glucose, UA: NEGATIVE
Leukocytes, UA: NEGATIVE
Protein, ur: NEGATIVE
pH: 6.5

## 2011-03-31 LAB — CBC
HCT: 38.6 — ABNORMAL LOW
HCT: 45.1
Hemoglobin: 13.5
Hemoglobin: 14.9
Hemoglobin: 15.9
MCHC: 34.1
MCHC: 35.2
MCV: 89.9
MCV: 90.7
MCV: 91.7
RBC: 4.25
RBC: 5.02
RDW: 13
WBC: 6.8

## 2011-03-31 LAB — DIFFERENTIAL
Basophils Relative: 0
Eosinophils Absolute: 0.1
Eosinophils Relative: 1
Monocytes Absolute: 1.4 — ABNORMAL HIGH
Monocytes Relative: 11
Neutrophils Relative %: 64

## 2011-03-31 LAB — URINE CULTURE: Culture: NO GROWTH

## 2011-03-31 LAB — ANAEROBIC CULTURE

## 2011-03-31 LAB — CULTURE, ROUTINE-ABSCESS

## 2011-03-31 LAB — BASIC METABOLIC PANEL
CO2: 25
Chloride: 103
Creatinine, Ser: 0.65
GFR calc Af Amer: >60

## 2011-03-31 LAB — APTT: aPTT: 34

## 2011-04-11 ENCOUNTER — Encounter: Payer: Self-pay | Admitting: *Deleted

## 2011-04-11 ENCOUNTER — Ambulatory Visit (INDEPENDENT_AMBULATORY_CARE_PROVIDER_SITE_OTHER): Payer: Medicaid Other | Admitting: *Deleted

## 2011-04-11 VITALS — BP 121/82 | HR 78 | Temp 98.2°F | Resp 16 | Wt 274.5 lb

## 2011-04-11 DIAGNOSIS — B20 Human immunodeficiency virus [HIV] disease: Secondary | ICD-10-CM

## 2011-04-11 NOTE — Progress Notes (Signed)
04/11/2011 @ 1500: Pt here for research study A5257/A5001, week 128. Pt states that tenderness in her right neck has resolved as well as the lesion in her left nares and lesion to perirectal area. Pt c/o blood filled bump that comes up under her left inner buttock. She states that she "pops" it every 2 weeks. She denies any puss, only blood appears and she thinks it is where a boil use to be. VSS; non-fasting labs drawn. Pt received $20.00 gift card for the visit. Next appointment scheduled for Wed, 07/20/2011 @ 9am. -- Tacey Heap RN II

## 2011-04-12 ENCOUNTER — Other Ambulatory Visit: Payer: Self-pay | Admitting: *Deleted

## 2011-04-12 DIAGNOSIS — B20 Human immunodeficiency virus [HIV] disease: Secondary | ICD-10-CM

## 2011-04-12 LAB — LIPID PANEL
HDL: 33 mg/dL — ABNORMAL LOW (ref >39)
Total CHOL/HDL Ratio: 4.7 Ratio
VLDL: 34 mg/dL (ref 0–40)

## 2011-04-12 LAB — HEPATIC FUNCTION PANEL
ALT: 49 U/L (ref 0–53)
Albumin: 4.5 g/dL (ref 3.5–5.2)
Bilirubin, Direct: 0.2 mg/dL (ref 0.0–0.3)
Total Bilirubin: 1.4 mg/dL — ABNORMAL HIGH (ref 0.3–1.2)

## 2011-04-12 LAB — BASIC METABOLIC PANEL
BUN: 15 mg/dL (ref 6–23)
Chloride: 103 mEq/L (ref 96–112)
Glucose, Bld: 87 mg/dL (ref 70–99)
Potassium: 4.2 mEq/L (ref 3.5–5.3)
Sodium: 141 mEq/L (ref 135–145)

## 2011-04-12 MED ORDER — ESCITALOPRAM OXALATE 20 MG PO TABS: 20.0000 mg | ORAL_TABLET | Freq: Every day | ORAL | Status: DC

## 2011-04-20 ENCOUNTER — Ambulatory Visit (INDEPENDENT_AMBULATORY_CARE_PROVIDER_SITE_OTHER): Payer: Medicaid Other | Admitting: Internal Medicine

## 2011-04-20 ENCOUNTER — Encounter: Payer: Self-pay | Admitting: Internal Medicine

## 2011-04-20 DIAGNOSIS — R197 Diarrhea, unspecified: Secondary | ICD-10-CM

## 2011-04-20 NOTE — Progress Notes (Signed)
  Subjective:    Patient ID: [name], male    DOB: 10/08/1970, 40 y.o.   MRN: [id number]  HPIthis patient is a 40 year old transgender male to male who comes in with complaint of diarrhea, myalgias and chills. She states she's had these symptoms since Sunday and also include nausea is a little vomiting, difficulty keeping fluids down, and also crampy abdominal pain. She denies blood in her stool, rashes, or sick contacts. She has had little relief since Sunday. She denies fever.    Review of Systems  Constitutional: Positive for chills and fatigue. Negative for fever and unexpected weight change.  HENT: Negative for congestion.   Respiratory: Negative for cough and shortness of breath.   Gastrointestinal: Positive for nausea, vomiting, abdominal pain and diarrhea. Negative for blood in stool and abdominal distention.  Genitourinary: Negative for dysuria and hematuria.  Musculoskeletal: Positive for myalgias. Negative for arthralgias.  Skin: Negative for rash.       Objective:   Physical Exam  Constitutional: He appears well-developed and well-nourished.       Mild distress with chlls  HENT:  Mouth/Throat: Oropharynx is clear and moist. No oropharyngeal exudate.  Cardiovascular: Normal rate, regular rhythm and normal heart sounds.   No murmur heard. Pulmonary/Chest: Effort normal and breath sounds normal. No respiratory distress. He has no wheezes.  Abdominal: Soft. Bowel sounds are normal. He exhibits no distension.       Mild diffuse tenderness, no rebound, no guarding, no Mcburney signs, no peritoneal signs  Lymphadenopathy:    He has no cervical adenopathy.  Skin: Skin is warm and dry. No erythema.  Psychiatric: He has a normal mood and affect. His behavior is normal.          Assessment & Plan:

## 2011-04-20 NOTE — Assessment & Plan Note (Signed)
She currently has viral gastroenteritis. I do not suspect acute abdomen with a benign exam with no peritoneal signs. Also do not suspect bacterial infection as there is no blood and she has general malaise, myalgias and chills which is consistent with a viral gastroenteritis. She does have mild dry mucous membranes and I did discuss with her at length the need to keep hydrated with electrolyte solution such as Gatorade and to take small sips every few minutes while awake and despite nausea or any vomiting continued doing that. I discussed concerning signs including intractable vomiting that makes it so she can't keep fluids down. I did tell her that if she should improve in 2-3 days and to let us know if she is not improving.

## 2011-04-20 NOTE — Patient Instructions (Signed)
Keep hydrated with an electrolyte solution

## 2011-04-25 ENCOUNTER — Other Ambulatory Visit: Payer: Self-pay | Admitting: Licensed Clinical Social Worker

## 2011-04-25 DIAGNOSIS — B20 Human immunodeficiency virus [HIV] disease: Secondary | ICD-10-CM

## 2011-04-25 DIAGNOSIS — F17200 Nicotine dependence, unspecified, uncomplicated: Secondary | ICD-10-CM

## 2011-04-25 DIAGNOSIS — F640 Transsexualism: Secondary | ICD-10-CM

## 2011-04-25 DIAGNOSIS — M549 Dorsalgia, unspecified: Secondary | ICD-10-CM

## 2011-04-25 MED ORDER — MORPHINE SULFATE CR 30 MG PO TB12: 30.0000 mg | ORAL_TABLET | Freq: Two times a day (BID) | ORAL | Status: DC

## 2011-04-25 MED ORDER — OXYCODONE-ACETAMINOPHEN 7.5-325 MG PO TABS: 1.0000 | ORAL_TABLET | Freq: Four times a day (QID) | ORAL | Status: DC | PRN

## 2011-05-04 ENCOUNTER — Encounter: Payer: Self-pay | Admitting: Infectious Disease

## 2011-05-04 LAB — CD4/CD8 (T-HELPER/T-SUPPRESSOR CELL)
CD4: 1710
CD8 % Suppressor T Cell: 34.8
CD8: 1427

## 2011-05-04 LAB — HIV-1 RNA QUANT-NO REFLEX-BLD: HIV-1 RNA Viral Load: <40

## 2011-05-30 ENCOUNTER — Other Ambulatory Visit: Payer: Self-pay | Admitting: Licensed Clinical Social Worker

## 2011-05-30 DIAGNOSIS — F17200 Nicotine dependence, unspecified, uncomplicated: Secondary | ICD-10-CM

## 2011-05-30 DIAGNOSIS — M549 Dorsalgia, unspecified: Secondary | ICD-10-CM

## 2011-05-30 DIAGNOSIS — F640 Transsexualism: Secondary | ICD-10-CM

## 2011-05-30 DIAGNOSIS — B20 Human immunodeficiency virus [HIV] disease: Secondary | ICD-10-CM

## 2011-05-30 MED ORDER — MORPHINE SULFATE CR 30 MG PO TB12: 30.0000 mg | ORAL_TABLET | Freq: Two times a day (BID) | ORAL | Status: DC

## 2011-05-30 MED ORDER — OXYCODONE-ACETAMINOPHEN 7.5-325 MG PO TABS: 1.0000 | ORAL_TABLET | Freq: Four times a day (QID) | ORAL | Status: DC | PRN

## 2011-06-22 ENCOUNTER — Telehealth: Payer: Self-pay | Admitting: *Deleted

## 2011-06-22 ENCOUNTER — Ambulatory Visit: Payer: Medicaid Other | Admitting: Infectious Diseases

## 2011-06-22 NOTE — Telephone Encounter (Signed)
I left a message asking that she call back to reschedule today's appt

## 2011-06-27 ENCOUNTER — Other Ambulatory Visit: Payer: Self-pay | Admitting: *Deleted

## 2011-06-27 DIAGNOSIS — F640 Transsexualism: Secondary | ICD-10-CM

## 2011-06-27 DIAGNOSIS — F17200 Nicotine dependence, unspecified, uncomplicated: Secondary | ICD-10-CM

## 2011-06-27 DIAGNOSIS — B20 Human immunodeficiency virus [HIV] disease: Secondary | ICD-10-CM

## 2011-06-27 DIAGNOSIS — M549 Dorsalgia, unspecified: Secondary | ICD-10-CM

## 2011-06-27 MED ORDER — MORPHINE SULFATE CR 30 MG PO TB12: 30.0000 mg | ORAL_TABLET | Freq: Two times a day (BID) | ORAL | Status: DC

## 2011-06-27 MED ORDER — OXYCODONE-ACETAMINOPHEN 7.5-325 MG PO TABS: 1.0000 | ORAL_TABLET | Freq: Four times a day (QID) | ORAL | Status: DC | PRN

## 2011-06-27 NOTE — Telephone Encounter (Signed)
Reprinted rxes for Dr. Ninetta Lights to sign.  Dr. [name] not available.

## 2011-07-15 ENCOUNTER — Other Ambulatory Visit: Payer: Self-pay | Admitting: *Deleted

## 2011-07-15 DIAGNOSIS — F419 Anxiety disorder, unspecified: Secondary | ICD-10-CM

## 2011-07-15 MED ORDER — ALPRAZOLAM 2 MG PO TABS: 2.0000 mg | ORAL_TABLET | Freq: Two times a day (BID) | ORAL | Status: DC | PRN

## 2011-07-15 NOTE — Telephone Encounter (Signed)
Patient called requesting refill on Alprazolam, last filled 10/12 with 2 refills. Rx called to [facility]. Wendall Mola CMA

## 2011-07-20 ENCOUNTER — Ambulatory Visit (INDEPENDENT_AMBULATORY_CARE_PROVIDER_SITE_OTHER): Payer: Medicaid Other | Admitting: *Deleted

## 2011-07-20 ENCOUNTER — Ambulatory Visit (INDEPENDENT_AMBULATORY_CARE_PROVIDER_SITE_OTHER): Payer: Medicaid Other | Admitting: Infectious Disease

## 2011-07-20 ENCOUNTER — Encounter: Payer: Self-pay | Admitting: Infectious Disease

## 2011-07-20 VITALS — BP 128/80 | HR 72 | Temp 97.4°F | Resp 16 | Wt 277.2 lb

## 2011-07-20 DIAGNOSIS — M549 Dorsalgia, unspecified: Secondary | ICD-10-CM

## 2011-07-20 DIAGNOSIS — F640 Transsexualism: Secondary | ICD-10-CM

## 2011-07-20 DIAGNOSIS — F17200 Nicotine dependence, unspecified, uncomplicated: Secondary | ICD-10-CM

## 2011-07-20 DIAGNOSIS — B86 Scabies: Secondary | ICD-10-CM | POA: Insufficient documentation

## 2011-07-20 DIAGNOSIS — Z21 Asymptomatic human immunodeficiency virus [HIV] infection status: Secondary | ICD-10-CM

## 2011-07-20 DIAGNOSIS — B20 Human immunodeficiency virus [HIV] disease: Secondary | ICD-10-CM

## 2011-07-20 LAB — COMPREHENSIVE METABOLIC PANEL
ALT: 25 U/L (ref 0–53)
BUN: 12 mg/dL (ref 6–23)
CO2: 27 mEq/L (ref 19–32)
Calcium: 9.3 mg/dL (ref 8.4–10.5)
Chloride: 103 mEq/L (ref 96–112)
Creat: 0.78 mg/dL (ref 0.50–1.35)
Total Bilirubin: 2.3 mg/dL — ABNORMAL HIGH (ref 0.3–1.2)

## 2011-07-20 LAB — LIPID PANEL
Cholesterol: 139 mg/dL (ref 0–200)
HDL: 32 mg/dL — ABNORMAL LOW (ref >39)
Total CHOL/HDL Ratio: 4.3 Ratio
VLDL: 34 mg/dL (ref 0–40)

## 2011-07-20 LAB — POCT URINALYSIS DIPSTICK
Bilirubin, UA: NEGATIVE
Blood, UA: NEGATIVE
Ketones, UA: NEGATIVE
Protein, UA: NEGATIVE
Spec Grav, UA: >=1.03
pH, UA: 6

## 2011-07-20 LAB — MICROALBUMIN / CREATININE URINE RATIO
Creatinine, Urine: 163.9 mg/dL
Microalb Creat Ratio: 15.3 mg/g (ref 0.0–30.0)

## 2011-07-20 MED ORDER — MORPHINE SULFATE CR 30 MG PO TB12: 30.0000 mg | ORAL_TABLET | Freq: Two times a day (BID) | ORAL | Status: DC

## 2011-07-20 MED ORDER — PERMETHRIN 5 % EX CREA: TOPICAL_CREAM | CUTANEOUS | Status: DC

## 2011-07-20 MED ORDER — OXYCODONE-ACETAMINOPHEN 7.5-325 MG PO TABS: 1.0000 | ORAL_TABLET | Freq: Four times a day (QID) | ORAL | Status: DC | PRN

## 2011-07-20 NOTE — Assessment & Plan Note (Signed)
Treat with permethrin wash entire body then wash off 8 hours later repeat process in one week wash clothes in hot water

## 2011-07-20 NOTE — Assessment & Plan Note (Signed)
Continue norvir Reyataz and Truvada. Patient would like to change to Atripla when she comes off study

## 2011-07-20 NOTE — Progress Notes (Signed)
Patient here for week 144 study visit. She c/o itchy rash over most of her body. Sores noted, some excoriated from where she scratched, says she has had it about a month and thinks it originated with a man she was with who also had sores on him. Will have Dr. [name] see her to evaluate. Otherwise, she denies any changes in assessment. She was informed that the study will end soon and she will need prescriptions from her physician.

## 2011-07-20 NOTE — Progress Notes (Signed)
Subjective:    Patient ID: [name], male    DOB: 1970/07/26, 41 y.o.   MRN: [id number]  HPI  [name] is worked into [name] on urgent basis for pruritic rash that developed her entire body, largely arms, legs, abdomen and buttocks. Lesions are erythematous and intensely pruritic occuring in areas that are covered by clothes and not covered close. She states she recent had sex with a mother man who was covered with scaly. Images lesions himself and wonders if she may have contracted it from him. After examining her I felt that she might very well have scabies. We did discuss treatment options for scabies and for treatment of her clothes and bed. We discussed the fact that her clinical trial is going to be an being soon and we discussed new potential antiviral regimens that she could be placed on. After much discussion she would like to be placed on a trip when she comes out of her current trial  Review of Systems  Constitutional: Negative for fever, chills, diaphoresis, activity change, appetite change, fatigue and unexpected weight change.  HENT: Negative for congestion, sore throat, rhinorrhea, sneezing, trouble swallowing and sinus pressure.   Eyes: Negative for photophobia and visual disturbance.  Respiratory: Negative for cough, chest tightness, shortness of breath, wheezing and stridor.   Cardiovascular: Negative for chest pain, palpitations and leg swelling.  Gastrointestinal: Negative for nausea, vomiting, abdominal pain, diarrhea, constipation, blood in stool, abdominal distention and anal bleeding.  Genitourinary: Negative for dysuria, hematuria, flank pain and difficulty urinating.  Musculoskeletal: Positive for joint swelling. Negative for myalgias, back pain, arthralgias and gait problem.  Skin: Positive for rash. Negative for color change, pallor and wound.  Neurological: Negative for dizziness, tremors, weakness and light-headedness.  Hematological: Negative for adenopathy. Does  not bruise/bleed easily.  Psychiatric/Behavioral: Negative for behavioral problems, confusion, sleep disturbance, dysphoric mood, decreased concentration and agitation.       Objective:   Physical Exam  Constitutional: He is oriented to person, place, and time. He appears well-developed and well-nourished. No distress.  HENT:  Head: Normocephalic and atraumatic.  Mouth/Throat: Oropharynx is clear and moist. No oropharyngeal exudate.  Eyes: Conjunctivae and EOM are normal. Pupils are equal, round, and reactive to light. No scleral icterus.  Neck: Normal range of motion. Neck supple. No JVD present.  Cardiovascular: Normal rate, regular rhythm and normal heart sounds.  Exam reveals no gallop and no friction rub.   No murmur heard. Pulmonary/Chest: Effort normal and breath sounds normal. No respiratory distress. He has no wheezes. He has no rales. He exhibits no tenderness.  Abdominal: He exhibits no distension and no mass. There is no tenderness. There is no rebound and no guarding.  Musculoskeletal: He exhibits no edema and no tenderness.  Lymphadenopathy:    He has no cervical adenopathy.  Neurological: He is alert and oriented to person, place, and time. He has normal reflexes. He exhibits normal muscle tone. Coordination normal.  Skin: Skin is warm and dry. Rash noted. He is not diaphoretic. There is erythema. No pallor.     Psychiatric: He has a normal mood and affect. His behavior is normal. Judgment and thought content normal.          Assessment & Plan:  Scabies Treat with permethrin wash entire body then wash off 8 hours later repeat process in one week wash clothes in hot water  HIV DISEASE Continue norvir Reyataz and Truvada. Patient would like to change to Atripla when she comes off  study

## 2011-07-21 ENCOUNTER — Other Ambulatory Visit: Payer: Self-pay | Admitting: Licensed Clinical Social Worker

## 2011-07-21 DIAGNOSIS — B86 Scabies: Secondary | ICD-10-CM

## 2011-07-21 MED ORDER — PERMETHRIN 5 % EX CREA: TOPICAL_CREAM | CUTANEOUS | Status: AC

## 2011-08-05 ENCOUNTER — Encounter: Payer: Self-pay | Admitting: Infectious Diseases

## 2011-08-05 LAB — CD4/CD8 (T-HELPER/T-SUPPRESSOR CELL)
CD4%: 44.7
CD4: 1565
CD8 % Suppressor T Cell: 34.4
CD8: 1204

## 2011-08-05 LAB — HIV-1 RNA QUANT-NO REFLEX-BLD: HIV-1 RNA Viral Load: <40

## 2011-08-23 ENCOUNTER — Other Ambulatory Visit: Payer: Self-pay | Admitting: Licensed Clinical Social Worker

## 2011-08-23 DIAGNOSIS — F17200 Nicotine dependence, unspecified, uncomplicated: Secondary | ICD-10-CM

## 2011-08-23 DIAGNOSIS — F640 Transsexualism: Secondary | ICD-10-CM

## 2011-08-23 DIAGNOSIS — M549 Dorsalgia, unspecified: Secondary | ICD-10-CM

## 2011-08-23 DIAGNOSIS — B20 Human immunodeficiency virus [HIV] disease: Secondary | ICD-10-CM

## 2011-08-23 MED ORDER — MORPHINE SULFATE CR 30 MG PO TB12: 30.0000 mg | ORAL_TABLET | Freq: Two times a day (BID) | ORAL | Status: DC

## 2011-08-23 MED ORDER — OXYCODONE-ACETAMINOPHEN 7.5-325 MG PO TABS: 1.0000 | ORAL_TABLET | Freq: Four times a day (QID) | ORAL | Status: DC | PRN

## 2011-08-26 ENCOUNTER — Telehealth: Payer: Self-pay | Admitting: *Deleted

## 2011-08-26 ENCOUNTER — Ambulatory Visit: Payer: Medicaid Other | Admitting: Infectious Disease

## 2011-08-26 NOTE — Telephone Encounter (Signed)
Called patient to see if he was ok as he missed an appointment today and was in pain yesterday when it was made. Someone answered the line there was lots of background noise and then the line went dead. I called back and it just rang, not able to leave a message.

## 2011-08-29 ENCOUNTER — Other Ambulatory Visit: Payer: Self-pay | Admitting: Licensed Clinical Social Worker

## 2011-08-29 ENCOUNTER — Other Ambulatory Visit: Payer: Self-pay | Admitting: *Deleted

## 2011-08-29 DIAGNOSIS — A4902 Methicillin resistant Staphylococcus aureus infection, unspecified site: Secondary | ICD-10-CM

## 2011-08-29 DIAGNOSIS — B20 Human immunodeficiency virus [HIV] disease: Secondary | ICD-10-CM

## 2011-08-29 MED ORDER — DOXYCYCLINE HYCLATE 100 MG PO TABS: 100.0000 mg | ORAL_TABLET | Freq: Two times a day (BID) | ORAL | Status: AC

## 2011-08-29 MED ORDER — SPIRONOLACTONE 50 MG PO TABS: 50.0000 mg | ORAL_TABLET | Freq: Every day | ORAL | Status: DC

## 2011-09-23 ENCOUNTER — Other Ambulatory Visit: Payer: Self-pay | Admitting: *Deleted

## 2011-09-23 DIAGNOSIS — M549 Dorsalgia, unspecified: Secondary | ICD-10-CM

## 2011-09-23 DIAGNOSIS — F640 Transsexualism: Secondary | ICD-10-CM

## 2011-09-23 DIAGNOSIS — F17200 Nicotine dependence, unspecified, uncomplicated: Secondary | ICD-10-CM

## 2011-09-23 DIAGNOSIS — B20 Human immunodeficiency virus [HIV] disease: Secondary | ICD-10-CM

## 2011-09-23 MED ORDER — OXYCODONE-ACETAMINOPHEN 7.5-325 MG PO TABS: 1.0000 | ORAL_TABLET | Freq: Four times a day (QID) | ORAL | Status: DC | PRN

## 2011-09-23 MED ORDER — MORPHINE SULFATE CR 30 MG PO TB12: 30.0000 mg | ORAL_TABLET | Freq: Two times a day (BID) | ORAL | Status: DC

## 2011-09-23 NOTE — Telephone Encounter (Signed)
Rx with provider for signature.

## 2011-09-26 ENCOUNTER — Other Ambulatory Visit: Payer: Self-pay | Admitting: *Deleted

## 2011-09-26 DIAGNOSIS — B20 Human immunodeficiency virus [HIV] disease: Secondary | ICD-10-CM

## 2011-09-26 DIAGNOSIS — F640 Transsexualism: Secondary | ICD-10-CM

## 2011-09-26 DIAGNOSIS — F17200 Nicotine dependence, unspecified, uncomplicated: Secondary | ICD-10-CM

## 2011-09-26 DIAGNOSIS — M549 Dorsalgia, unspecified: Secondary | ICD-10-CM

## 2011-09-26 MED ORDER — MORPHINE SULFATE CR 30 MG PO TB12: 30.0000 mg | ORAL_TABLET | Freq: Two times a day (BID) | ORAL | Status: DC

## 2011-10-12 ENCOUNTER — Other Ambulatory Visit: Payer: Self-pay | Admitting: Licensed Clinical Social Worker

## 2011-10-12 DIAGNOSIS — F419 Anxiety disorder, unspecified: Secondary | ICD-10-CM

## 2011-10-12 MED ORDER — ALPRAZOLAM 2 MG PO TABS: 2.0000 mg | ORAL_TABLET | Freq: Two times a day (BID) | ORAL | Status: DC | PRN

## 2011-10-13 ENCOUNTER — Other Ambulatory Visit: Payer: Self-pay | Admitting: *Deleted

## 2011-10-13 DIAGNOSIS — F17200 Nicotine dependence, unspecified, uncomplicated: Secondary | ICD-10-CM

## 2011-10-13 DIAGNOSIS — F640 Transsexualism: Secondary | ICD-10-CM

## 2011-10-13 DIAGNOSIS — M549 Dorsalgia, unspecified: Secondary | ICD-10-CM

## 2011-10-13 DIAGNOSIS — B20 Human immunodeficiency virus [HIV] disease: Secondary | ICD-10-CM

## 2011-10-13 MED ORDER — RITONAVIR 100 MG PO TABS: 100.0000 mg | ORAL_TABLET | Freq: Every day | ORAL | Status: DC

## 2011-10-25 ENCOUNTER — Other Ambulatory Visit: Payer: Self-pay | Admitting: *Deleted

## 2011-10-25 DIAGNOSIS — M549 Dorsalgia, unspecified: Secondary | ICD-10-CM

## 2011-10-25 DIAGNOSIS — G894 Chronic pain syndrome: Secondary | ICD-10-CM

## 2011-10-25 MED ORDER — OXYCODONE-ACETAMINOPHEN 7.5-325 MG PO TABS: 1.0000 | ORAL_TABLET | Freq: Four times a day (QID) | ORAL | Status: DC | PRN

## 2011-10-25 MED ORDER — MORPHINE SULFATE ER 30 MG PO TBCR: EXTENDED_RELEASE_TABLET | ORAL | Status: DC

## 2011-11-15 ENCOUNTER — Encounter: Payer: Self-pay | Admitting: *Deleted

## 2011-11-22 ENCOUNTER — Ambulatory Visit (INDEPENDENT_AMBULATORY_CARE_PROVIDER_SITE_OTHER): Payer: Medicaid Other | Admitting: *Deleted

## 2011-11-22 ENCOUNTER — Other Ambulatory Visit: Payer: Self-pay | Admitting: *Deleted

## 2011-11-22 VITALS — BP 116/88 | HR 75 | Temp 97.4°F | Resp 16 | Wt 281.8 lb

## 2011-11-22 DIAGNOSIS — B20 Human immunodeficiency virus [HIV] disease: Secondary | ICD-10-CM

## 2011-11-22 DIAGNOSIS — Z21 Asymptomatic human immunodeficiency virus [HIV] infection status: Secondary | ICD-10-CM

## 2011-11-22 LAB — LIPID PANEL
Cholesterol: 163 mg/dL (ref 0–200)
HDL: 34 mg/dL — ABNORMAL LOW (ref >39)
Total CHOL/HDL Ratio: 4.8 Ratio

## 2011-11-22 LAB — POCT URINALYSIS DIPSTICK
Glucose, UA: NEGATIVE
Nitrite, UA: NEGATIVE
Protein, UA: NEGATIVE
Urobilinogen, UA: 1

## 2011-11-22 LAB — HEPATIC FUNCTION PANEL
Albumin: 4.3 g/dL (ref 3.5–5.2)
Alkaline Phosphatase: 99 U/L (ref 39–117)
Total Protein: 7.5 g/dL (ref 6.0–8.3)

## 2011-11-22 LAB — BASIC METABOLIC PANEL
CO2: 30 mEq/L (ref 19–32)
Calcium: 9.9 mg/dL (ref 8.4–10.5)
Creat: 0.84 mg/dL (ref 0.50–1.35)
Glucose, Bld: 108 mg/dL — ABNORMAL HIGH (ref 70–99)

## 2011-11-22 MED ORDER — OMEPRAZOLE 20 MG PO CPDR: 20.0000 mg | DELAYED_RELEASE_CAPSULE | Freq: Every day | ORAL | Status: DC

## 2011-11-22 NOTE — Progress Notes (Signed)
Patient here for week 160 study visit. This is the final visit for study A5257. She has one more visit on ALLRT and then she is eligible to enroll on the [facility] study once it opens. She denies any new problems currently, but she does have a couple of lesions on her buttocks that are new. She says that she has been sleeping a lot and feeling tired, which may be related to depressed mood. She will return in September for the next study appt. She was interested in being switched to Atripla once she is off study and will follow up with Dr. [name].

## 2011-11-24 ENCOUNTER — Other Ambulatory Visit: Payer: Self-pay | Admitting: *Deleted

## 2011-11-24 DIAGNOSIS — M549 Dorsalgia, unspecified: Secondary | ICD-10-CM

## 2011-11-24 DIAGNOSIS — G894 Chronic pain syndrome: Secondary | ICD-10-CM

## 2011-11-24 MED ORDER — OXYCODONE-ACETAMINOPHEN 7.5-325 MG PO TABS: 1.0000 | ORAL_TABLET | Freq: Four times a day (QID) | ORAL | Status: DC | PRN

## 2011-11-24 MED ORDER — MORPHINE SULFATE ER 30 MG PO TBCR: EXTENDED_RELEASE_TABLET | ORAL | Status: DC

## 2011-12-09 ENCOUNTER — Encounter: Payer: Self-pay | Admitting: Infectious Disease

## 2011-12-09 ENCOUNTER — Telehealth: Payer: Self-pay | Admitting: *Deleted

## 2011-12-09 LAB — CD4/CD8 (T-HELPER/T-SUPPRESSOR CELL): CD4%: 40.7

## 2011-12-09 NOTE — Telephone Encounter (Signed)
[name] called and wanted to know if it was all right to take lipozene, a weight loss product she ordered online. She didn't want to take it if it was going to affect her T counts. I told her I would check with Dr. [name] and get back with her.

## 2011-12-12 ENCOUNTER — Telehealth: Payer: Self-pay | Admitting: *Deleted

## 2011-12-12 NOTE — Telephone Encounter (Signed)
I cannot tell what the ingredients are int his product beyond the acai berry. In general I would prefer our patients to avoid these types of products esp if we dont know what is in them

## 2011-12-12 NOTE — Telephone Encounter (Signed)
[name] says she is completely out of her HIV meds from study. She said that you had talked with Dr. [name] about starting Atripla once the study was over. She uses [facility] [address]. I will check with him about a prescription.

## 2011-12-13 ENCOUNTER — Telehealth: Payer: Self-pay | Admitting: Infectious Disease

## 2011-12-13 MED ORDER — EFAVIRENZ-EMTRICITAB-TENOFOVIR 600-200-300 MG PO TABS: 1.0000 | ORAL_TABLET | Freq: Every day | ORAL | Status: DC

## 2011-12-13 NOTE — Telephone Encounter (Signed)
Patient off study wants to start atripla

## 2011-12-16 ENCOUNTER — Encounter: Payer: Self-pay | Admitting: Infectious Disease

## 2011-12-23 ENCOUNTER — Telehealth: Payer: Self-pay | Admitting: *Deleted

## 2011-12-23 DIAGNOSIS — G894 Chronic pain syndrome: Secondary | ICD-10-CM

## 2011-12-23 DIAGNOSIS — F419 Anxiety disorder, unspecified: Secondary | ICD-10-CM

## 2011-12-23 DIAGNOSIS — M549 Dorsalgia, unspecified: Secondary | ICD-10-CM

## 2011-12-23 MED ORDER — MORPHINE SULFATE ER 30 MG PO TBCR: EXTENDED_RELEASE_TABLET | ORAL | Status: DC

## 2011-12-23 MED ORDER — OXYCODONE-ACETAMINOPHEN 7.5-325 MG PO TABS: 1.0000 | ORAL_TABLET | Freq: Four times a day (QID) | ORAL | Status: DC | PRN

## 2011-12-23 NOTE — Telephone Encounter (Signed)
Patient called and advised he needs this med refilled

## 2011-12-26 ENCOUNTER — Other Ambulatory Visit: Payer: Self-pay | Admitting: Licensed Clinical Social Worker

## 2011-12-26 ENCOUNTER — Telehealth: Payer: Self-pay | Admitting: *Deleted

## 2011-12-26 DIAGNOSIS — G894 Chronic pain syndrome: Secondary | ICD-10-CM

## 2011-12-26 DIAGNOSIS — M549 Dorsalgia, unspecified: Secondary | ICD-10-CM

## 2011-12-26 MED ORDER — MORPHINE SULFATE ER 30 MG PO TBCR: EXTENDED_RELEASE_TABLET | ORAL | Status: DC

## 2011-12-26 MED ORDER — OXYCODONE-ACETAMINOPHEN 7.5-325 MG PO TABS: 1.0000 | ORAL_TABLET | Freq: Four times a day (QID) | ORAL | Status: DC | PRN

## 2011-12-26 NOTE — Telephone Encounter (Signed)
Pharmacy requesting Prior Authorization for pt's pain rxes.  Phone call to [facility]) [phone].  Approval received for oxycodone-APAP and morphine rxes.  Approval #s - [id number], [id number].  Approval through 12/24/12.

## 2012-01-13 ENCOUNTER — Other Ambulatory Visit: Payer: Self-pay | Admitting: *Deleted

## 2012-01-13 DIAGNOSIS — F419 Anxiety disorder, unspecified: Secondary | ICD-10-CM

## 2012-01-13 MED ORDER — ALPRAZOLAM 2 MG PO TABS: 2.0000 mg | ORAL_TABLET | Freq: Two times a day (BID) | ORAL | Status: DC | PRN

## 2012-01-25 ENCOUNTER — Other Ambulatory Visit: Payer: Self-pay | Admitting: *Deleted

## 2012-01-25 ENCOUNTER — Other Ambulatory Visit: Payer: Self-pay | Admitting: Licensed Clinical Social Worker

## 2012-01-25 ENCOUNTER — Ambulatory Visit (INDEPENDENT_AMBULATORY_CARE_PROVIDER_SITE_OTHER): Payer: Medicaid Other | Admitting: *Deleted

## 2012-01-25 VITALS — BP 129/90 | HR 77 | Temp 97.6°F | Resp 16 | Wt 277.0 lb

## 2012-01-25 DIAGNOSIS — M549 Dorsalgia, unspecified: Secondary | ICD-10-CM

## 2012-01-25 DIAGNOSIS — B20 Human immunodeficiency virus [HIV] disease: Secondary | ICD-10-CM

## 2012-01-25 DIAGNOSIS — J45909 Unspecified asthma, uncomplicated: Secondary | ICD-10-CM

## 2012-01-25 DIAGNOSIS — G894 Chronic pain syndrome: Secondary | ICD-10-CM

## 2012-01-25 DIAGNOSIS — R7309 Other abnormal glucose: Secondary | ICD-10-CM

## 2012-01-25 LAB — COMPREHENSIVE METABOLIC PANEL
ALT: 44 U/L (ref 0–53)
AST: 33 U/L (ref 0–37)
Calcium: 9.6 mg/dL (ref 8.4–10.5)
Chloride: 105 mEq/L (ref 96–112)
Creat: 0.8 mg/dL (ref 0.50–1.35)
Sodium: 142 mEq/L (ref 135–145)
Total Protein: 7.6 g/dL (ref 6.0–8.3)

## 2012-01-25 LAB — LIPID PANEL
Total CHOL/HDL Ratio: 4.6 Ratio
VLDL: 42 mg/dL — ABNORMAL HIGH (ref 0–40)

## 2012-01-25 LAB — CD4/CD8 (T-HELPER/T-SUPPRESSOR CELL)
CD4%: 47.5
CD4: 2138

## 2012-01-25 MED ORDER — OXYCODONE-ACETAMINOPHEN 7.5-325 MG PO TABS: 1.0000 | ORAL_TABLET | Freq: Four times a day (QID) | ORAL | Status: DC | PRN

## 2012-01-25 MED ORDER — METFORMIN HCL 500 MG PO TABS: 500.0000 mg | ORAL_TABLET | Freq: Every day | ORAL | Status: DC

## 2012-01-25 MED ORDER — MORPHINE SULFATE ER 30 MG PO TBCR: EXTENDED_RELEASE_TABLET | ORAL | Status: DC

## 2012-01-25 MED ORDER — MOMETASONE FUROATE 220 MCG/INH IN AEPB: 2.0000 | INHALATION_SPRAY | Freq: Every day | RESPIRATORY_TRACT | Status: DC

## 2012-01-25 NOTE — Progress Notes (Signed)
[name] is here for her final ALLRT study visit. She says that the atripla she started 2 months ago has not caused any side effects. She is currently having cold sx past 2 days of cough, nasal congestion and night sweats. She has noticed an increase in SOB, chest tightness and is asking for  daily inhalation tx. Of either asmanex or advair. She is eligible for [facility] and will be enrolled in the Fall.

## 2012-01-26 ENCOUNTER — Telehealth: Payer: Self-pay | Admitting: Infectious Disease

## 2012-01-26 MED ORDER — FLUTICASONE-SALMETEROL 100-50 MCG/DOSE IN AEPB: 1.0000 | INHALATION_SPRAY | Freq: Two times a day (BID) | RESPIRATORY_TRACT | Status: DC

## 2012-01-26 NOTE — Telephone Encounter (Signed)
Pt needed new steroid inhalers. I put in script for advair since this should be fine now that she is no longer on norvir

## 2012-02-06 ENCOUNTER — Telehealth: Payer: Self-pay | Admitting: *Deleted

## 2012-02-06 NOTE — Telephone Encounter (Signed)
Called patient to confirm CAB meeting date and time. He sounded very hoarse and said he had been sick for over a week with bad cold sx of nonproductive  cough, SOB, cold sweats, fever, has not been able to sleep and doesn't feel like it is getting any better. Scheduled patient for a clinic visit tomorrow am.

## 2012-02-07 ENCOUNTER — Ambulatory Visit: Payer: Medicaid Other | Admitting: Internal Medicine

## 2012-02-14 ENCOUNTER — Ambulatory Visit (INDEPENDENT_AMBULATORY_CARE_PROVIDER_SITE_OTHER): Payer: Medicaid Other | Admitting: Infectious Disease

## 2012-02-14 ENCOUNTER — Other Ambulatory Visit (HOSPITAL_COMMUNITY)
Admission: RE | Admit: 2012-02-14 | Discharge: 2012-02-14 | Disposition: A | Discharge status: Home / Self Care | Payer: Medicaid Other | Source: Ambulatory Visit | Attending: Infectious Disease | Admitting: Infectious Disease

## 2012-02-14 ENCOUNTER — Encounter: Payer: Self-pay | Admitting: Infectious Disease

## 2012-02-14 ENCOUNTER — Other Ambulatory Visit: Payer: Self-pay | Admitting: *Deleted

## 2012-02-14 VITALS — BP 156/99 | HR 89 | Temp 98.5°F | Wt 280.0 lb

## 2012-02-14 DIAGNOSIS — J45909 Unspecified asthma, uncomplicated: Secondary | ICD-10-CM

## 2012-02-14 DIAGNOSIS — R21 Rash and other nonspecific skin eruption: Secondary | ICD-10-CM

## 2012-02-14 DIAGNOSIS — Z113 Encounter for screening for infections with a predominantly sexual mode of transmission: Secondary | ICD-10-CM | POA: Insufficient documentation

## 2012-02-14 DIAGNOSIS — J209 Acute bronchitis, unspecified: Secondary | ICD-10-CM

## 2012-02-14 DIAGNOSIS — B20 Human immunodeficiency virus [HIV] disease: Secondary | ICD-10-CM

## 2012-02-14 MED ORDER — ALBUTEROL SULFATE HFA 108 (90 BASE) MCG/ACT IN AERS: 2.0000 | INHALATION_SPRAY | Freq: Four times a day (QID) | RESPIRATORY_TRACT | Status: DC | PRN

## 2012-02-14 MED ORDER — TRIAMCINOLONE ACETONIDE 0.5 % EX CREA: TOPICAL_CREAM | Freq: Two times a day (BID) | CUTANEOUS | Status: AC

## 2012-02-14 MED ORDER — FLUTICASONE-SALMETEROL 100-50 MCG/DOSE IN AEPB: 1.0000 | INHALATION_SPRAY | Freq: Two times a day (BID) | RESPIRATORY_TRACT | Status: DC

## 2012-02-14 NOTE — Assessment & Plan Note (Signed)
See above

## 2012-02-14 NOTE — Assessment & Plan Note (Signed)
Has been perfectly controlled, continue atripla unless her foot problem is odd drug rash

## 2012-02-14 NOTE — Assessment & Plan Note (Signed)
Try topical steroid, check RPR. She carries dx of scabies in the past but this does not look like scabies to me

## 2012-02-14 NOTE — Assessment & Plan Note (Signed)
Looks to be having asthmatic flare. Albuterol MDI and restart advair

## 2012-02-14 NOTE — Patient Instructions (Addendum)
We can get all of your meds refilled for you   Please let us know if the advair is not approved and have pharmacy contact us with paperwork we need to fill out  If rash worsens on steroid pleas let us know

## 2012-02-14 NOTE — Progress Notes (Signed)
  Subjective:    Patient ID: [name], male    DOB: 04-08-1971, 41 y.o.   MRN: [id number]  HPI  [name] returns today for clinic visit. She is tolerating Atripla without problems. She has noticed new onset of rash on bottom of her feet bilaterally with blisters, vesicles. She has dry skin but not tried anything topically or systemically.  She is also recovering from upper respiratory tract infection and appears to have an asthma flare. She has not had albuterol or inhaled steroid. I spent greater than 45 minutes with the patient including greater than 50% of time in face to face counsel of the patient and in coordination of their care.   Review of Systems  Constitutional: Negative for fever, chills, diaphoresis, activity change, appetite change, fatigue and unexpected weight change.  HENT: Negative for congestion, sore throat, rhinorrhea, sneezing, trouble swallowing and sinus pressure.   Eyes: Negative for photophobia and visual disturbance.  Respiratory: Positive for cough, shortness of breath and wheezing. Negative for chest tightness and stridor.   Cardiovascular: Negative for chest pain, palpitations and leg swelling.  Gastrointestinal: Negative for nausea, vomiting, abdominal pain, diarrhea, constipation, blood in stool, abdominal distention and anal bleeding.  Genitourinary: Negative for dysuria, hematuria, flank pain and difficulty urinating.  Musculoskeletal: Negative for myalgias, back pain, joint swelling, arthralgias and gait problem.  Skin: Positive for rash. Negative for color change, pallor and wound.  Neurological: Negative for dizziness, tremors, weakness and light-headedness.  Hematological: Negative for adenopathy. Does not bruise/bleed easily.  Psychiatric/Behavioral: Negative for behavioral problems, confusion, disturbed wake/sleep cycle, dysphoric mood, decreased concentration and agitation.       Objective:   Physical Exam  Constitutional: He is oriented to  person, place, and time. He appears well-developed and well-nourished. No distress.  HENT:  Head: Normocephalic and atraumatic.  Mouth/Throat: Oropharynx is clear and moist. No oropharyngeal exudate.  Eyes: Conjunctivae and EOM are normal. Pupils are equal, round, and reactive to light. No scleral icterus.  Neck: Normal range of motion. Neck supple. No JVD present.  Cardiovascular: Normal rate, regular rhythm and normal heart sounds.  Exam reveals no gallop and no friction rub.   No murmur heard. Pulmonary/Chest: Effort normal. No respiratory distress. He has wheezes. He has no rales. He exhibits no tenderness.  Abdominal: He exhibits no distension and no mass. There is no tenderness. There is no rebound and no guarding.  Musculoskeletal: He exhibits no edema and no tenderness.       Feet:  Lymphadenopathy:    He has no cervical adenopathy.  Neurological: He is alert and oriented to person, place, and time. He has normal reflexes. He exhibits normal muscle tone. Coordination normal.  Skin: Skin is warm and dry. He is not diaphoretic. No erythema. No pallor.  Psychiatric: He has a normal mood and affect. His behavior is normal. Judgment and thought content normal.          Assessment & Plan:  HIV DISEASE Has been perfectly controlled, continue atripla unless her foot problem is odd drug rash  Acute bronchitis Looks to be having asthmatic flare. Albuterol MDI and restart advair  ASTHMA, UNSPECIFIED, UNSPECIFIED STATUS See above  Rash Try topical steroid, check RPR. She carries dx of scabies in the past but this does not look like scabies to me

## 2012-02-15 LAB — RPR

## 2012-02-16 ENCOUNTER — Other Ambulatory Visit: Payer: Self-pay | Admitting: *Deleted

## 2012-02-16 DIAGNOSIS — J45909 Unspecified asthma, uncomplicated: Secondary | ICD-10-CM

## 2012-02-16 MED ORDER — ALBUTEROL SULFATE HFA 108 (90 BASE) MCG/ACT IN AERS: 2.0000 | INHALATION_SPRAY | Freq: Four times a day (QID) | RESPIRATORY_TRACT | Status: DC | PRN

## 2012-02-21 ENCOUNTER — Other Ambulatory Visit: Payer: Self-pay | Admitting: Licensed Clinical Social Worker

## 2012-02-21 DIAGNOSIS — M549 Dorsalgia, unspecified: Secondary | ICD-10-CM

## 2012-02-21 DIAGNOSIS — G894 Chronic pain syndrome: Secondary | ICD-10-CM

## 2012-02-21 MED ORDER — MORPHINE SULFATE ER 30 MG PO TBCR: EXTENDED_RELEASE_TABLET | ORAL | Status: DC

## 2012-02-21 MED ORDER — OXYCODONE-ACETAMINOPHEN 7.5-325 MG PO TABS: 1.0000 | ORAL_TABLET | Freq: Four times a day (QID) | ORAL | Status: DC | PRN

## 2012-03-27 ENCOUNTER — Other Ambulatory Visit: Payer: Self-pay | Admitting: *Deleted

## 2012-03-27 DIAGNOSIS — M549 Dorsalgia, unspecified: Secondary | ICD-10-CM

## 2012-03-27 DIAGNOSIS — G894 Chronic pain syndrome: Secondary | ICD-10-CM

## 2012-03-27 MED ORDER — MORPHINE SULFATE ER 30 MG PO TBCR: EXTENDED_RELEASE_TABLET | ORAL | Status: DC

## 2012-03-27 MED ORDER — OXYCODONE-ACETAMINOPHEN 7.5-325 MG PO TABS: 1.0000 | ORAL_TABLET | Freq: Four times a day (QID) | ORAL | Status: DC | PRN

## 2012-04-05 ENCOUNTER — Encounter: Payer: Self-pay | Admitting: Infectious Disease

## 2012-04-13 ENCOUNTER — Other Ambulatory Visit: Payer: Self-pay | Admitting: *Deleted

## 2012-04-13 DIAGNOSIS — B20 Human immunodeficiency virus [HIV] disease: Secondary | ICD-10-CM

## 2012-04-13 DIAGNOSIS — F419 Anxiety disorder, unspecified: Secondary | ICD-10-CM

## 2012-04-13 MED ORDER — ALPRAZOLAM 2 MG PO TABS: 2.0000 mg | ORAL_TABLET | Freq: Two times a day (BID) | ORAL | Status: DC | PRN

## 2012-04-13 MED ORDER — ESCITALOPRAM OXALATE 20 MG PO TABS: 20.0000 mg | ORAL_TABLET | Freq: Every day | ORAL | Status: DC

## 2012-04-16 ENCOUNTER — Other Ambulatory Visit: Payer: Self-pay | Admitting: *Deleted

## 2012-04-16 NOTE — Telephone Encounter (Signed)
Previously filled on 04/13/12.  Duplicate request.

## 2012-04-24 ENCOUNTER — Other Ambulatory Visit: Payer: Self-pay | Admitting: *Deleted

## 2012-04-24 DIAGNOSIS — M549 Dorsalgia, unspecified: Secondary | ICD-10-CM

## 2012-04-24 DIAGNOSIS — G894 Chronic pain syndrome: Secondary | ICD-10-CM

## 2012-04-24 MED ORDER — MORPHINE SULFATE ER 30 MG PO TBCR: EXTENDED_RELEASE_TABLET | ORAL | Status: DC

## 2012-04-24 MED ORDER — OXYCODONE-ACETAMINOPHEN 7.5-325 MG PO TABS: 1.0000 | ORAL_TABLET | Freq: Four times a day (QID) | ORAL | Status: DC | PRN

## 2012-05-24 ENCOUNTER — Other Ambulatory Visit: Payer: Self-pay | Admitting: *Deleted

## 2012-05-24 DIAGNOSIS — G894 Chronic pain syndrome: Secondary | ICD-10-CM

## 2012-05-24 DIAGNOSIS — M549 Dorsalgia, unspecified: Secondary | ICD-10-CM

## 2012-05-24 MED ORDER — MORPHINE SULFATE ER 30 MG PO TBCR: EXTENDED_RELEASE_TABLET | ORAL | Status: DC

## 2012-05-24 MED ORDER — OXYCODONE-ACETAMINOPHEN 7.5-325 MG PO TABS: 1.0000 | ORAL_TABLET | Freq: Four times a day (QID) | ORAL | Status: DC | PRN

## 2012-06-26 ENCOUNTER — Other Ambulatory Visit: Payer: Self-pay | Admitting: *Deleted

## 2012-06-26 DIAGNOSIS — M549 Dorsalgia, unspecified: Secondary | ICD-10-CM

## 2012-06-26 DIAGNOSIS — G894 Chronic pain syndrome: Secondary | ICD-10-CM

## 2012-06-26 MED ORDER — MORPHINE SULFATE ER 30 MG PO TBCR: EXTENDED_RELEASE_TABLET | ORAL | Status: DC

## 2012-06-26 MED ORDER — OXYCODONE-ACETAMINOPHEN 7.5-325 MG PO TABS: 1.0000 | ORAL_TABLET | Freq: Four times a day (QID) | ORAL | Status: DC | PRN

## 2012-07-24 ENCOUNTER — Other Ambulatory Visit: Payer: Self-pay | Admitting: Licensed Clinical Social Worker

## 2012-07-24 DIAGNOSIS — M549 Dorsalgia, unspecified: Secondary | ICD-10-CM

## 2012-07-24 MED ORDER — OXYCODONE-ACETAMINOPHEN 7.5-325 MG PO TABS: 1.0000 | ORAL_TABLET | Freq: Four times a day (QID) | ORAL | Status: DC | PRN

## 2012-07-27 ENCOUNTER — Other Ambulatory Visit: Payer: Self-pay | Admitting: *Deleted

## 2012-07-27 DIAGNOSIS — G894 Chronic pain syndrome: Secondary | ICD-10-CM

## 2012-07-27 DIAGNOSIS — B20 Human immunodeficiency virus [HIV] disease: Secondary | ICD-10-CM

## 2012-07-27 MED ORDER — OMEPRAZOLE 20 MG PO CPDR: 20.0000 mg | DELAYED_RELEASE_CAPSULE | Freq: Every day | ORAL | Status: DC

## 2012-07-27 MED ORDER — MORPHINE SULFATE ER 30 MG PO TBCR: EXTENDED_RELEASE_TABLET | ORAL | Status: DC

## 2012-07-31 ENCOUNTER — Other Ambulatory Visit: Payer: Medicaid Other

## 2012-08-09 ENCOUNTER — Other Ambulatory Visit: Payer: Self-pay | Admitting: Licensed Clinical Social Worker

## 2012-08-09 DIAGNOSIS — F419 Anxiety disorder, unspecified: Secondary | ICD-10-CM

## 2012-08-09 MED ORDER — ALPRAZOLAM 2 MG PO TABS: 2.0000 mg | ORAL_TABLET | Freq: Two times a day (BID) | ORAL | Status: DC | PRN

## 2012-08-14 ENCOUNTER — Other Ambulatory Visit: Payer: Medicaid Other

## 2012-08-16 ENCOUNTER — Other Ambulatory Visit: Payer: Self-pay | Admitting: Infectious Disease

## 2012-08-16 ENCOUNTER — Other Ambulatory Visit: Payer: Medicaid Other

## 2012-08-16 ENCOUNTER — Encounter: Payer: Self-pay | Admitting: *Deleted

## 2012-08-16 ENCOUNTER — Ambulatory Visit (INDEPENDENT_AMBULATORY_CARE_PROVIDER_SITE_OTHER): Payer: Self-pay | Admitting: *Deleted

## 2012-08-16 VITALS — BP 146/87 | HR 89 | Temp 98.5°F | Resp 16 | Ht 71.0 in | Wt 280.0 lb

## 2012-08-16 DIAGNOSIS — B20 Human immunodeficiency virus [HIV] disease: Secondary | ICD-10-CM

## 2012-08-16 NOTE — Progress Notes (Signed)
Pt here for A5322 ENTRY visit. Consent discussed and questions answered. Pt verbalized understanding and signed consent. Pt c/o excessive sleeping that started a week ago. He will wake up from 12-18 hours of sleep, get up, and feel tired a few hours later. He questions if it is a possibility he may have sleep apnea. He has not changed any medications, diet, or exercise regimen. Does wake up coughing and throat is sometimes sore. I told pt I would inform Dr. [name] of this new finding and pt will ask about this at his next visit with PCP. Fasting labs were drawn with no problems. Pt received $50.00 gift card for study visit. Will call pt in August 2014 to schedule next study visit. Tacey Heap RN

## 2012-08-17 LAB — CBC WITH DIFFERENTIAL/PLATELET
Basophils Absolute: 0 10*3/uL (ref 0.0–0.1)
Basophils Relative: 0 % (ref 0–1)
HCT: 47.7 % (ref 39.0–52.0)
Lymphocytes Relative: 42 % (ref 12–46)
MCHC: 35.6 g/dL (ref 30.0–36.0)
Neutro Abs: 5.2 10*3/uL (ref 1.7–7.7)
Neutrophils Relative %: 45 % (ref 43–77)
Platelets: 318 10*3/uL (ref 150–400)
RDW: 13.1 % (ref 11.5–15.5)
WBC: 11.6 10*3/uL — ABNORMAL HIGH (ref 4.0–10.5)

## 2012-08-17 LAB — CREATININE, URINE, RANDOM: Creatinine, Urine: 398.5 mg/dL

## 2012-08-17 LAB — COMPREHENSIVE METABOLIC PANEL
Albumin: 4.5 g/dL (ref 3.5–5.2)
BUN: 9 mg/dL (ref 6–23)
CO2: 26 mEq/L (ref 19–32)
Calcium: 9.1 mg/dL (ref 8.4–10.5)
Chloride: 103 mEq/L (ref 96–112)
Creat: 0.8 mg/dL (ref 0.50–1.35)

## 2012-08-17 LAB — HEPATITIS B SURFACE ANTIGEN: Hepatitis B Surface Ag: NEGATIVE

## 2012-08-17 LAB — PROTEIN, URINE, RANDOM: Total Protein, Urine: 33 mg/dL

## 2012-08-17 LAB — HEPATITIS C ANTIBODY: HCV Ab: NEGATIVE

## 2012-08-17 LAB — LIPID PANEL
Cholesterol: 189 mg/dL (ref 0–200)
Total CHOL/HDL Ratio: 5.1 Ratio

## 2012-08-17 LAB — T-HELPER CELL (CD4) - (RCID CLINIC ONLY): CD4 T Cell Abs: 2450 uL (ref 400–2700)

## 2012-08-17 LAB — HEMOGLOBIN A1C: Mean Plasma Glucose: 120 mg/dL — ABNORMAL HIGH (ref <117)

## 2012-08-19 LAB — HEPATITIS B SURFACE ANTIBODY,QUALITATIVE: Hep B S Ab: NONREACTIVE

## 2012-08-23 ENCOUNTER — Telehealth: Payer: Self-pay | Admitting: *Deleted

## 2012-08-23 DIAGNOSIS — M549 Dorsalgia, unspecified: Secondary | ICD-10-CM

## 2012-08-23 MED ORDER — MORPHINE SULFATE ER 30 MG PO TBCR: EXTENDED_RELEASE_TABLET | ORAL | Status: DC

## 2012-08-23 MED ORDER — OXYCODONE-ACETAMINOPHEN 7.5-325 MG PO TABS: 1.0000 | ORAL_TABLET | Freq: Four times a day (QID) | ORAL | Status: DC | PRN

## 2012-08-23 NOTE — Telephone Encounter (Signed)
Pt requesting monthly refill of pain rxes.  Last refills printed on 07/24/12 and picked up 07/27/12

## 2012-09-12 ENCOUNTER — Other Ambulatory Visit: Payer: Self-pay | Admitting: *Deleted

## 2012-09-12 DIAGNOSIS — B20 Human immunodeficiency virus [HIV] disease: Secondary | ICD-10-CM

## 2012-09-12 MED ORDER — SPIRONOLACTONE 50 MG PO TABS: 50.0000 mg | ORAL_TABLET | Freq: Every day | ORAL | Status: DC

## 2012-09-24 ENCOUNTER — Other Ambulatory Visit: Payer: Self-pay | Admitting: *Deleted

## 2012-09-24 DIAGNOSIS — M549 Dorsalgia, unspecified: Secondary | ICD-10-CM

## 2012-09-24 DIAGNOSIS — G894 Chronic pain syndrome: Secondary | ICD-10-CM

## 2012-09-24 MED ORDER — OXYCODONE-ACETAMINOPHEN 7.5-325 MG PO TABS: 1.0000 | ORAL_TABLET | Freq: Four times a day (QID) | ORAL | Status: DC | PRN

## 2012-09-24 MED ORDER — MORPHINE SULFATE ER 30 MG PO TBCR: EXTENDED_RELEASE_TABLET | ORAL | Status: DC

## 2012-10-24 ENCOUNTER — Other Ambulatory Visit: Payer: Self-pay | Admitting: *Deleted

## 2012-10-24 DIAGNOSIS — M549 Dorsalgia, unspecified: Secondary | ICD-10-CM

## 2012-10-24 DIAGNOSIS — G894 Chronic pain syndrome: Secondary | ICD-10-CM

## 2012-10-24 MED ORDER — MORPHINE SULFATE ER 30 MG PO TBCR: EXTENDED_RELEASE_TABLET | ORAL | Status: DC

## 2012-10-24 MED ORDER — OXYCODONE-ACETAMINOPHEN 7.5-325 MG PO TABS: 1.0000 | ORAL_TABLET | Freq: Four times a day (QID) | ORAL | Status: DC | PRN

## 2012-11-12 ENCOUNTER — Other Ambulatory Visit: Payer: Self-pay | Admitting: Licensed Clinical Social Worker

## 2012-11-12 DIAGNOSIS — F419 Anxiety disorder, unspecified: Secondary | ICD-10-CM

## 2012-11-12 MED ORDER — ALPRAZOLAM 2 MG PO TABS: 2.0000 mg | ORAL_TABLET | Freq: Two times a day (BID) | ORAL | Status: DC | PRN

## 2012-11-22 ENCOUNTER — Other Ambulatory Visit: Payer: Self-pay | Admitting: *Deleted

## 2012-11-22 DIAGNOSIS — G894 Chronic pain syndrome: Secondary | ICD-10-CM

## 2012-11-22 DIAGNOSIS — M549 Dorsalgia, unspecified: Secondary | ICD-10-CM

## 2012-11-22 MED ORDER — MORPHINE SULFATE ER 30 MG PO TBCR: EXTENDED_RELEASE_TABLET | ORAL | Status: DC

## 2012-11-22 MED ORDER — OXYCODONE-ACETAMINOPHEN 7.5-325 MG PO TABS: 1.0000 | ORAL_TABLET | Freq: Four times a day (QID) | ORAL | Status: DC | PRN

## 2012-12-03 ENCOUNTER — Ambulatory Visit (INDEPENDENT_AMBULATORY_CARE_PROVIDER_SITE_OTHER): Payer: Medicaid Other | Admitting: Internal Medicine

## 2012-12-03 ENCOUNTER — Encounter: Payer: Self-pay | Admitting: Internal Medicine

## 2012-12-03 VITALS — BP 144/89 | HR 80 | Temp 98.6°F | Ht 71.0 in | Wt 281.5 lb

## 2012-12-03 DIAGNOSIS — K1120 Sialoadenitis, unspecified: Secondary | ICD-10-CM

## 2012-12-03 DIAGNOSIS — B20 Human immunodeficiency virus [HIV] disease: Secondary | ICD-10-CM

## 2012-12-03 DIAGNOSIS — H109 Unspecified conjunctivitis: Secondary | ICD-10-CM

## 2012-12-03 MED ORDER — CEPHALEXIN 500 MG PO CAPS: 500.0000 mg | ORAL_CAPSULE | Freq: Four times a day (QID) | ORAL | Status: DC

## 2012-12-03 NOTE — Progress Notes (Signed)
Patient ID: [name], male   DOB: 10-26-1970, 42 y.o.   MRN: [id number]          [facility] for Infectious Disease  Patient Active Problem List   Diagnosis Date Noted  . Parotitis 12/03/2012  . Conjunctivitis 12/03/2012  . Rash 02/14/2012  . Scabies 07/20/2011  . Smoker 03/07/2011  . Cervicalgia 03/07/2011  . 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 CHRONIC PAIN SYNDROME 06/11/2009  . 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 SHOULDER PAIN, CHRONIC 01/13/2009  . ANXIETY STATE, UNSPECIFIED 11/04/2008  . ROTATOR CUFF INJURY, RIGHT SHOULDER 11/04/2008  . HYPOKALEMIA 09/27/2008  . SWELLING MASS OR LUMP IN HEAD AND NECK 09/27/2008  . CHEST PAIN 09/27/2008  . TRANSAMINASES, SERUM, ELEVATED 09/27/2008  . DIABETES MELLITUS, BORDERLINE 09/09/2008  . ASTHMA, UNSPECIFIED, UNSPECIFIED STATUS 02/07/2008  . LUMBAGO 02/07/2008  . GENDER IDENTITY DISORDER 10/31/2007  . DEPRESSION 10/17/2007  . CELLULITIS AND ABSCESS OF OTHER SPECIFIED SITE 10/17/2007  . FURUNCLE 06/20/2007  . PRODUCTIVE COUGH 04/24/2007  . Acute bronchitis 03/02/2007  . DIARRHEA 03/02/2007  . ABSCESS, PARAPHARYNGEAL 12/08/2006  . HIV DISEASE 04/14/2006  . HIDRADENITIS SUPPURATIVA 04/14/2006    Patient's Medications  New Prescriptions   CEPHALEXIN (KEFLEX) 500 MG CAPSULE    Take 1 capsule (500 mg total) by mouth 4 (four) times daily.  Previous Medications   ALBUTEROL (PROVENTIL HFA) 108 (90 BASE) MCG/ACT INHALER    Inhale 2 puffs into the lungs every 6 (six) hours as needed.   ALPRAZOLAM (XANAX) 2 MG TABLET    Take 1 tablet (2 mg total) by mouth 2 (two) times daily as needed for sleep.   EFAVIRENZ-EMTRICITABINE-TENOFOVIR (ATRIPLA) 600-200-300 MG PER TABLET    Take 1 tablet by mouth at bedtime.   ESCITALOPRAM (LEXAPRO) 20 MG TABLET    Take 1 tablet (20 mg total) by mouth daily.   FLUTICASONE-SALMETEROL (ADVAIR) 100-50 MCG/DOSE AEPB    Inhale 1 puff into the lungs 2 (two) times daily.   IMIQUIMOD (ALDARA) 5 %  CREAM    Apply topically 3 (three) times a week.     METFORMIN (GLUCOPHAGE) 500 MG TABLET    Take 1 tablet (500 mg total) by mouth daily.   MORPHINE (MS CONTIN) 30 MG 12 HR TABLET    Take one tablet by mouth in the morning & 2 tablets in the evening. Dispense 90 tablets per month.   OMEPRAZOLE (PRILOSEC) 20 MG CAPSULE    Take 1 capsule (20 mg total) by mouth daily. Please do not take any more than 20mg  and take it 12 hours before reyataz and norvir   ONDANSETRON (ZOFRAN) 4 MG TABLET    Take 4 mg by mouth every 8 (eight) hours as needed.     OXYCODONE-ACETAMINOPHEN (PERCOCET) 7.5-325 MG PER TABLET    Take 1 tablet by mouth every 6 (six) hours as needed.   SPIRONOLACTONE (ALDACTONE) 50 MG TABLET    Take 1 tablet (50 mg total) by mouth daily.   TRAZODONE HCL PO    Take 100 mg by mouth at bedtime as needed.     TRIAMCINOLONE CREAM (KENALOG) 0.5 %    Apply topically 2 (two) times daily. To feet  Modified Medications   No medications on file  Discontinued Medications   No medications on file    Subjective: [name] is seen on a work in basis. She began noticing some pain and swelling of her right parotid gland recently. She's had previous left submandibular salivary gland surgery and has some problems  with chronic dry mouth. It is worse recently. She's not aware of any fevers.  She has also started having red irritated eyes and notes some matting of her eyelashes when she first wakes in the morning.she has been able to take her oral medications and does not believe she is missed any doses.  Review of Systems: Pertinent items are noted in HPI.  Past Medical History  Diagnosis Date  . Diabetes mellitus   . Hypertension     History  Substance Use Topics  . Smoking status: Current Every Day Smoker -- 1.00 packs/day    Types: Cigarettes  . Smokeless tobacco: Never Used  . Alcohol Use: No    No family history on file.  Allergies  Allergen Reactions  . Propoxyphene-Acetaminophen      Objective: Temp: 98.6 F (37 C) (06/23 1404) Temp src: Oral (06/23 1404) BP: 144/89 mmHg (06/23 1404) Pulse Rate: 80 (06/23 1404)  General: She appears slightly uncomfortable but otherwise in no distress Eyes: Bilateral conjunctival redness Oral: dry mouth. Wharton's duct not identified. No stones or irregularity palpated along the right cheek. Her right parotid gland is diffusely swollen and tender.   Lab Results HIV 1 RNA Quant (copies/mL)  Date Value  08/16/2012 <20   05/11/2010 39   01/25/2010 39      HIV-1 RNA Viral Load (no units)  Date Value  01/25/2012 <40   11/22/2011 <40   07/20/2011 <40      CD4 T Cell Abs (cmm)  Date Value  08/16/2012 2450   08/12/2008 970   04/28/2008 910      Assessment: It appears that she has developed acute right parotitis and may have infection. She started using lemon drops and does not feel this has helped. I will treat her with oral cephalexin. If she does not improve soon I would suggest that we get her back to see her ENT physician, Dr. Allegra Grana.  She also has bilateral conjunctivitis is probably a separate problem. I instructed her on how to clean her eyes frequently during the day with a warm washcloth and use saline eye drops.  Her HIV infection remains under excellent control.  Plan: 1. Continue Atripla 2. Continue lemon drops and start cephalexin 500 mg 4 times daily for 5 days 3. I cleansing and saline eyedrops. Also instructed her on good hand hygiene 4. Followup with Dr. [name] after lab work in 3 months   Cliffton Asters, MD [facility] for Infectious Disease [facility] [phone] pager   [phone] cell 12/03/2012, 2:52 PM

## 2012-12-11 ENCOUNTER — Encounter: Payer: Self-pay | Admitting: Internal Medicine

## 2012-12-11 ENCOUNTER — Ambulatory Visit (INDEPENDENT_AMBULATORY_CARE_PROVIDER_SITE_OTHER): Payer: Medicaid Other | Admitting: Internal Medicine

## 2012-12-11 ENCOUNTER — Other Ambulatory Visit (HOSPITAL_COMMUNITY)
Admission: RE | Admit: 2012-12-11 | Discharge: 2012-12-11 | Disposition: A | Discharge status: Home / Self Care | Payer: Medicaid Other | Source: Ambulatory Visit | Attending: Internal Medicine | Admitting: Internal Medicine

## 2012-12-11 VITALS — BP 144/92 | HR 96 | Temp 97.9°F | Wt 276.0 lb

## 2012-12-11 DIAGNOSIS — Z113 Encounter for screening for infections with a predominantly sexual mode of transmission: Secondary | ICD-10-CM | POA: Insufficient documentation

## 2012-12-11 DIAGNOSIS — R300 Dysuria: Secondary | ICD-10-CM

## 2012-12-11 DIAGNOSIS — K1121 Acute sialoadenitis: Secondary | ICD-10-CM

## 2012-12-11 DIAGNOSIS — K1120 Sialoadenitis, unspecified: Secondary | ICD-10-CM

## 2012-12-11 NOTE — Progress Notes (Signed)
[facility] CLINIC NOTE  RFV: routine  Subjective:    Patient ID: [name], male    DOB: Oct 05, 1970, 42 y.o.   MRN: [id number]  HPI [name], M -> F, with HIV, Cd 4 count 2450/VL <20, on Atripla. Seen last week for right parotitis and bilateral conjunctivitis. Somewhat improved still having discomfort. She is usually seen by Dr. [name] but seen by Dr. Orvan Falconer for acute visit. The patient here to get referral to ENT and is somewhat upset from her last visit. She states that the advice of taking sour candy and hot compresses have not improved her symptoms. She is concerned that she will need another surgery and would like referral. She states that she still owes money from her last surgery by Dr. Pollyann Kennedy.  Current Outpatient Prescriptions on File Prior to Visit  Medication Sig Dispense Refill  . albuterol (PROVENTIL HFA) 108 (90 BASE) MCG/ACT inhaler Inhale 2 puffs into the lungs every 6 (six) hours as needed.  1 Inhaler  3  . alprazolam (XANAX) 2 MG tablet Take 1 tablet (2 mg total) by mouth 2 (two) times daily as needed for sleep.  60 tablet  2  . efavirenz-emtricitabine-tenofovir (ATRIPLA) 600-200-300 MG per tablet Take 1 tablet by mouth at bedtime.  30 tablet  11  . escitalopram (LEXAPRO) 20 MG tablet Take 1 tablet (20 mg total) by mouth daily.  30 tablet  6  . Fluticasone-Salmeterol (ADVAIR) 100-50 MCG/DOSE AEPB Inhale 1 puff into the lungs 2 (two) times daily.  60 each  3  . imiquimod (ALDARA) 5 % cream Apply topically 3 (three) times a week.        . metFORMIN (GLUCOPHAGE) 500 MG tablet Take 1 tablet (500 mg total) by mouth daily.  30 tablet  11  . morphine (MS CONTIN) 30 MG 12 hr tablet Take one tablet by mouth in the morning & 2 tablets in the evening. Dispense 90 tablets per month.  90 tablet  0  . omeprazole (PRILOSEC) 20 MG capsule Take 1 capsule (20 mg total) by mouth daily. Please do not take any more than 20mg  and take it 12 hours before reyataz and norvir  30 capsule  6  .  oxyCODONE-acetaminophen (PERCOCET) 7.5-325 MG per tablet Take 1 tablet by mouth every 6 (six) hours as needed.  120 tablet  0  . spironolactone (ALDACTONE) 50 MG tablet Take 1 tablet (50 mg total) by mouth daily.  30 tablet  11  . cephALEXin (KEFLEX) 500 MG capsule Take 1 capsule (500 mg total) by mouth 4 (four) times daily.  20 capsule  0  . ondansetron (ZOFRAN) 4 MG tablet Take 4 mg by mouth every 8 (eight) hours as needed.        . TRAZODONE HCL PO Take 100 mg by mouth at bedtime as needed.        . triamcinolone cream (KENALOG) 0.5 % Apply topically 2 (two) times daily. To feet  30 g  2   No current facility-administered medications on file prior to visit.   Active Ambulatory Problems    Diagnosis Date Noted  . HIV DISEASE 04/14/2006  . 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 HYPOKALEMIA 09/27/2008  . ANXIETY STATE, UNSPECIFIED 11/04/2008  . GENDER IDENTITY DISORDER 10/31/2007  . DEPRESSION 10/17/2007  . CHRONIC PAIN SYNDROME 06/11/2009  . Acute bronchitis 03/02/2007  . ABSCESS, PARAPHARYNGEAL 12/08/2006  . ASTHMA, UNSPECIFIED, UNSPECIFIED STATUS 02/07/2008  . FURUNCLE 06/20/2007  . CELLULITIS AND ABSCESS OF OTHER  SPECIFIED SITE 10/17/2007  . HIDRADENITIS SUPPURATIVA 04/14/2006  . 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 SHOULDER PAIN, CHRONIC 01/13/2009  . LUMBAGO 02/07/2008  . ROTATOR CUFF INJURY, RIGHT SHOULDER 11/04/2008  . SWELLING MASS OR LUMP IN HEAD AND NECK 09/27/2008  . PRODUCTIVE COUGH 04/24/2007  . CHEST PAIN 09/27/2008  . DIARRHEA 03/02/2007  . DIABETES MELLITUS, BORDERLINE 09/09/2008  . TRANSAMINASES, SERUM, ELEVATED 09/27/2008  . Smoker 03/07/2011  . Cervicalgia 03/07/2011  . Scabies 07/20/2011  . Rash 02/14/2012  . Parotitis 12/03/2012  . Conjunctivitis 12/03/2012   Resolved Ambulatory Problems    Diagnosis Date Noted  . OTITIS MEDIA, BILATERAL 07/28/2009   Past Medical History  Diagnosis Date  . Diabetes mellitus   . Hypertension       Review of  Systems 12 point ROS is negative other than what is mentioned in hpi as well as mentioning that she has occasional urinary incontinence. No symptoms in the last 3-5 days    Objective:   Physical Exam BP 144/92  Pulse 96  Temp(Src) 97.9 F (36.6 C) (Oral)  Wt 276 lb (125.193 kg)  BMI 38.51 kg/m2 gen = a xo by 3 non toxic appearing Heent= conjunctiva is clear bilaterally. Oral pharynx is clear . Poor dentition. Mild enduration to right parotid gnald Neck = + cervical LAD right side      Assessment & Plan:   right side parotitis = ref to dr. Pollyann Kennedy, use hot compress if still having inflammation  Urinary incontinence = will do ua, urine culture, and std screening

## 2012-12-12 LAB — URINALYSIS, ROUTINE W REFLEX MICROSCOPIC
Leukocytes, UA: NEGATIVE
Nitrite: NEGATIVE
Specific Gravity, Urine: 1.018 (ref 1.005–1.030)
Urobilinogen, UA: 0.2 mg/dL (ref 0.0–1.0)
pH: 6 (ref 5.0–8.0)

## 2012-12-12 LAB — URINE CULTURE: Organism ID, Bacteria: NO GROWTH

## 2012-12-21 ENCOUNTER — Telehealth: Payer: Self-pay | Admitting: *Deleted

## 2012-12-21 NOTE — Telephone Encounter (Signed)
Patient called c/o ear pain. When I called her back she said she had gotten water in her ear and tried rubbing alcohol and she is no longer having pain. Wendall Mola

## 2012-12-24 ENCOUNTER — Other Ambulatory Visit: Payer: Self-pay | Admitting: *Deleted

## 2012-12-24 DIAGNOSIS — M549 Dorsalgia, unspecified: Secondary | ICD-10-CM

## 2012-12-24 DIAGNOSIS — G894 Chronic pain syndrome: Secondary | ICD-10-CM

## 2012-12-24 MED ORDER — MORPHINE SULFATE ER 30 MG PO TBCR: EXTENDED_RELEASE_TABLET | ORAL | Status: DC

## 2012-12-24 MED ORDER — OXYCODONE-ACETAMINOPHEN 7.5-325 MG PO TABS: 1.0000 | ORAL_TABLET | Freq: Four times a day (QID) | ORAL | Status: DC | PRN

## 2013-01-23 ENCOUNTER — Other Ambulatory Visit: Payer: Self-pay | Admitting: *Deleted

## 2013-01-23 DIAGNOSIS — M549 Dorsalgia, unspecified: Secondary | ICD-10-CM

## 2013-01-23 DIAGNOSIS — G894 Chronic pain syndrome: Secondary | ICD-10-CM

## 2013-01-23 MED ORDER — MORPHINE SULFATE ER 30 MG PO TBCR: EXTENDED_RELEASE_TABLET | ORAL | Status: DC

## 2013-01-23 MED ORDER — OXYCODONE-ACETAMINOPHEN 7.5-325 MG PO TABS: 1.0000 | ORAL_TABLET | Freq: Four times a day (QID) | ORAL | Status: DC | PRN

## 2013-01-23 NOTE — Telephone Encounter (Signed)
Patient called and advised she needs to pick up her medications.

## 2013-01-24 ENCOUNTER — Other Ambulatory Visit: Payer: Self-pay | Admitting: Licensed Clinical Social Worker

## 2013-01-24 DIAGNOSIS — R7309 Other abnormal glucose: Secondary | ICD-10-CM

## 2013-01-24 DIAGNOSIS — B20 Human immunodeficiency virus [HIV] disease: Secondary | ICD-10-CM

## 2013-01-24 MED ORDER — EFAVIRENZ-EMTRICITAB-TENOFOVIR 600-200-300 MG PO TABS: 1.0000 | ORAL_TABLET | Freq: Every day | ORAL | Status: DC

## 2013-01-24 MED ORDER — METFORMIN HCL 500 MG PO TABS: 500.0000 mg | ORAL_TABLET | Freq: Every day | ORAL | Status: DC

## 2013-02-07 ENCOUNTER — Encounter: Payer: Self-pay | Admitting: Internal Medicine

## 2013-02-07 ENCOUNTER — Ambulatory Visit (INDEPENDENT_AMBULATORY_CARE_PROVIDER_SITE_OTHER): Payer: Self-pay | Admitting: *Deleted

## 2013-02-07 ENCOUNTER — Ambulatory Visit (INDEPENDENT_AMBULATORY_CARE_PROVIDER_SITE_OTHER): Payer: Medicaid Other | Admitting: Internal Medicine

## 2013-02-07 ENCOUNTER — Other Ambulatory Visit (INDEPENDENT_AMBULATORY_CARE_PROVIDER_SITE_OTHER): Payer: Self-pay

## 2013-02-07 VITALS — BP 135/87 | HR 82 | Temp 98.1°F | Ht 71.0 in | Wt 266.0 lb

## 2013-02-07 VITALS — BP 135/87 | HR 82 | Temp 98.1°F | Resp 16 | Ht 71.0 in | Wt 266.8 lb

## 2013-02-07 DIAGNOSIS — H6120 Impacted cerumen, unspecified ear: Secondary | ICD-10-CM | POA: Insufficient documentation

## 2013-02-07 DIAGNOSIS — Z23 Encounter for immunization: Secondary | ICD-10-CM

## 2013-02-07 DIAGNOSIS — B20 Human immunodeficiency virus [HIV] disease: Secondary | ICD-10-CM

## 2013-02-07 DIAGNOSIS — Z21 Asymptomatic human immunodeficiency virus [HIV] infection status: Secondary | ICD-10-CM

## 2013-02-07 DIAGNOSIS — H93231 Hyperacusis, right ear: Secondary | ICD-10-CM

## 2013-02-07 DIAGNOSIS — H93239 Hyperacusis, unspecified ear: Secondary | ICD-10-CM

## 2013-02-07 NOTE — Progress Notes (Signed)
  Subjective:    Patient ID: [name], male    DOB: [birth year], 42 y.o.   MRN: [id number]  HPI [name] comes in for a work in visit. She has had some congestion and difficulty hearing out of her right ear. She was in here for her routine study visit. No fever or chills. She typically has not had seasonal allergies. Has not tried any over-the-counter medicine    Review of Systems  Constitutional: Negative for fever and chills.  HENT: Positive for hearing loss, congestion and rhinorrhea.   Eyes: Negative for visual disturbance.  Respiratory: Negative for cough.   Gastrointestinal: Negative for diarrhea.  Musculoskeletal: Negative for myalgias.  Skin: Negative for rash.  Neurological: Negative for dizziness and headaches.  Hematological: Negative for adenopathy.  Psychiatric/Behavioral: Negative for dysphoric mood.       Objective:   Physical Exam  Constitutional: He is oriented to person, place, and time. He appears well-developed and well-nourished. No distress.  HENT:  Right ear with wax and no TM seen.  Left ear with bulging TM but no erythema and notable for congestion  Eyes: Right eye exhibits no discharge. Left eye exhibits no discharge. No scleral icterus.  Cardiovascular: Normal rate, regular rhythm and normal heart sounds.   No murmur heard. Pulmonary/Chest: Effort normal and breath sounds normal. He has no wheezes.  Lymphadenopathy:    He has no cervical adenopathy.  Neurological: He is alert and oriented to person, place, and time.  Skin: No rash noted.  Psychiatric: He has a normal mood and affect. His behavior is normal.          Assessment & Plan:

## 2013-02-07 NOTE — Assessment & Plan Note (Signed)
Doing well and is here for study visit. Takes her medicine.

## 2013-02-07 NOTE — Progress Notes (Signed)
Pt here for A5322 study, week 24. Only complaint is nasal congestion and tightness/decreased hearing in ears bilat. This has been going on for around 2 months. She has tried cleaning her ears with alcohol and peroxide with no relief. She also stated she had lost her balance one time but did not fall. Dr. Luciana Axe will see her today for this problem and will received pneumo and flu vaccinations. Has been no change in her currents medications. She stated she went through a period where she did not want to come out of the house or do anything for the month of June. She would give friends her credit card and send them to do her errands. Since the beginning of July she has felt better and yesterday was outside the house pressure washing it. Fasting labs were drawn and vital signs are stable. Questionnaires completed. She received $50 gift card for visit. Will call her in 6 months to schedule next appointment. Tacey Heap RN

## 2013-02-07 NOTE — Assessment & Plan Note (Addendum)
Cerumen impaction.  Will have right ear cleaned out. She also will try Benadryl for the congestion.  Right ear reexamined after her cleaning. He does have some erythema on the TM and what looks like an open area. I will have her ear rechecked in 2 weeks to see if there is any significant concern for

## 2013-02-08 ENCOUNTER — Other Ambulatory Visit: Payer: Self-pay | Admitting: *Deleted

## 2013-02-08 DIAGNOSIS — F419 Anxiety disorder, unspecified: Secondary | ICD-10-CM

## 2013-02-08 DIAGNOSIS — B20 Human immunodeficiency virus [HIV] disease: Secondary | ICD-10-CM

## 2013-02-08 LAB — HIV-1 RNA QUANT-NO REFLEX-BLD
HIV 1 RNA Quant: 21 copies/mL (ref <20)
HIV-1 RNA Quant, Log: 1.32 {Log} — ABNORMAL HIGH (ref <1.30)

## 2013-02-08 MED ORDER — OMEPRAZOLE 20 MG PO CPDR: 20.0000 mg | DELAYED_RELEASE_CAPSULE | Freq: Every day | ORAL | Status: DC

## 2013-02-08 MED ORDER — ALPRAZOLAM 2 MG PO TABS: 2.0000 mg | ORAL_TABLET | Freq: Two times a day (BID) | ORAL | Status: DC | PRN

## 2013-02-18 ENCOUNTER — Other Ambulatory Visit: Payer: Self-pay | Admitting: *Deleted

## 2013-02-18 DIAGNOSIS — B20 Human immunodeficiency virus [HIV] disease: Secondary | ICD-10-CM

## 2013-02-18 MED ORDER — ESCITALOPRAM OXALATE 20 MG PO TABS: 20.0000 mg | ORAL_TABLET | Freq: Every day | ORAL | Status: DC

## 2013-02-21 ENCOUNTER — Encounter: Payer: Self-pay | Admitting: Infectious Disease

## 2013-02-22 ENCOUNTER — Other Ambulatory Visit: Payer: Self-pay | Admitting: Licensed Clinical Social Worker

## 2013-02-22 DIAGNOSIS — M549 Dorsalgia, unspecified: Secondary | ICD-10-CM

## 2013-02-22 DIAGNOSIS — G894 Chronic pain syndrome: Secondary | ICD-10-CM

## 2013-02-22 MED ORDER — MORPHINE SULFATE ER 30 MG PO TBCR: EXTENDED_RELEASE_TABLET | ORAL | Status: DC

## 2013-02-22 MED ORDER — OXYCODONE-ACETAMINOPHEN 7.5-325 MG PO TABS: 1.0000 | ORAL_TABLET | Freq: Four times a day (QID) | ORAL | Status: DC | PRN

## 2013-02-27 ENCOUNTER — Encounter: Payer: Self-pay | Admitting: Infectious Disease

## 2013-02-27 ENCOUNTER — Ambulatory Visit (INDEPENDENT_AMBULATORY_CARE_PROVIDER_SITE_OTHER): Payer: Medicaid Other | Admitting: Infectious Disease

## 2013-02-27 VITALS — BP 128/87 | HR 97 | Temp 98.7°F | Wt 273.0 lb

## 2013-02-27 DIAGNOSIS — F411 Generalized anxiety disorder: Secondary | ICD-10-CM

## 2013-02-27 DIAGNOSIS — H6690 Otitis media, unspecified, unspecified ear: Secondary | ICD-10-CM

## 2013-02-27 DIAGNOSIS — F419 Anxiety disorder, unspecified: Secondary | ICD-10-CM

## 2013-02-27 DIAGNOSIS — B20 Human immunodeficiency virus [HIV] disease: Secondary | ICD-10-CM

## 2013-02-27 DIAGNOSIS — H6691 Otitis media, unspecified, right ear: Secondary | ICD-10-CM

## 2013-02-27 DIAGNOSIS — K219 Gastro-esophageal reflux disease without esophagitis: Secondary | ICD-10-CM

## 2013-02-27 MED ORDER — ALPRAZOLAM 2 MG PO TABS: 2.0000 mg | ORAL_TABLET | Freq: Two times a day (BID) | ORAL | Status: DC | PRN

## 2013-02-27 MED ORDER — OMEPRAZOLE 20 MG PO CPDR: 20.0000 mg | DELAYED_RELEASE_CAPSULE | Freq: Two times a day (BID) | ORAL | Status: DC

## 2013-02-27 MED ORDER — AMOXICILLIN-POT CLAVULANATE 875-125 MG PO TABS: 1.0000 | ORAL_TABLET | Freq: Two times a day (BID) | ORAL | Status: DC

## 2013-02-27 NOTE — Progress Notes (Signed)
  Subjective:    Patient ID: [name], male    DOB: 11-13-1970, 42 y.o.   MRN: [id number]  HPI   [name] returns today for clinic visit. She is tolerating Atripla without problems.   She is having a couple of issues:  #1 GERD not controlled with lower dose prilosec.   #2 Still trouble with sensation of fullness in ear no fevers or chills or malaise.    Review of Systems  Constitutional: Negative for fever, chills, diaphoresis, activity change, appetite change, fatigue and unexpected weight change.  HENT: Positive for hearing loss. Negative for congestion, sore throat, rhinorrhea, sneezing, trouble swallowing and sinus pressure.   Eyes: Negative for photophobia and visual disturbance.  Respiratory: Negative for cough, chest tightness, shortness of breath, wheezing and stridor.   Cardiovascular: Negative for chest pain, palpitations and leg swelling.  Gastrointestinal: Negative for nausea, vomiting, abdominal pain, diarrhea, constipation, blood in stool, abdominal distention and anal bleeding.  Genitourinary: Negative for dysuria, hematuria, flank pain and difficulty urinating.  Musculoskeletal: Negative for myalgias, back pain, joint swelling, arthralgias and gait problem.  Skin: Positive for rash. Negative for color change, pallor and wound.  Neurological: Negative for dizziness, tremors, weakness and light-headedness.  Hematological: Negative for adenopathy. Does not bruise/bleed easily.  Psychiatric/Behavioral: Negative for behavioral problems, confusion, sleep disturbance, dysphoric mood, decreased concentration and agitation.       Objective:   Physical Exam  Constitutional: He is oriented to person, place, and time. He appears well-developed and well-nourished. No distress.  HENT:  Head: Normocephalic and atraumatic.  Ears:  Mouth/Throat: Oropharynx is clear and moist. No oropharyngeal exudate.  Eyes: Conjunctivae and EOM are normal. Pupils are equal, round, and  reactive to light. No scleral icterus.  Neck: Normal range of motion. Neck supple. No JVD present.  Cardiovascular: Normal rate, regular rhythm and normal heart sounds.  Exam reveals no gallop and no friction rub.   No murmur heard. Pulmonary/Chest: Effort normal. No respiratory distress. He has wheezes. He has no rales. He exhibits no tenderness.  Abdominal: He exhibits no distension and no mass. There is no tenderness. There is no rebound and no guarding.  Musculoskeletal: He exhibits no edema and no tenderness.  Lymphadenopathy:    He has no cervical adenopathy.  Neurological: He is alert and oriented to person, place, and time. He has normal reflexes. He exhibits normal muscle tone. Coordination normal.  Skin: Skin is warm and dry. He is not diaphoretic. No erythema. No pallor.  Psychiatric: He has a normal mood and affect. His behavior is normal. Judgment and thought content normal.          Assessment & Plan:  HIV: continue Atripla  GERD not responding to PPI: Double dose, avoid late meals. Ask for EGD  Ear effusion, loss of hearing; will give augmentin for possible Otitis Media. If not resolved then refer to ENT and audiology  HCM: flu shot

## 2013-03-11 ENCOUNTER — Other Ambulatory Visit: Payer: Self-pay | Admitting: *Deleted

## 2013-03-11 DIAGNOSIS — B20 Human immunodeficiency virus [HIV] disease: Secondary | ICD-10-CM

## 2013-03-11 MED ORDER — OMEPRAZOLE 20 MG PO CPDR: 20.0000 mg | DELAYED_RELEASE_CAPSULE | Freq: Two times a day (BID) | ORAL | Status: DC

## 2013-03-26 ENCOUNTER — Other Ambulatory Visit: Payer: Self-pay | Admitting: Licensed Clinical Social Worker

## 2013-03-26 ENCOUNTER — Other Ambulatory Visit: Payer: Medicaid Other

## 2013-03-26 DIAGNOSIS — Z79891 Long term (current) use of opiate analgesic: Secondary | ICD-10-CM

## 2013-03-26 DIAGNOSIS — G894 Chronic pain syndrome: Secondary | ICD-10-CM

## 2013-03-26 DIAGNOSIS — M549 Dorsalgia, unspecified: Secondary | ICD-10-CM

## 2013-03-26 MED ORDER — OXYCODONE-ACETAMINOPHEN 7.5-325 MG PO TABS: 1.0000 | ORAL_TABLET | Freq: Four times a day (QID) | ORAL | Status: DC | PRN

## 2013-03-26 MED ORDER — MORPHINE SULFATE ER 30 MG PO TBCR: EXTENDED_RELEASE_TABLET | ORAL | Status: DC

## 2013-03-26 NOTE — Progress Notes (Unsigned)
Urine Drug screen per pain contract.  Laurell Josephs, RN

## 2013-03-27 LAB — DRUG SCREEN, URINE
Benzodiazepines.: NEGATIVE
Cocaine Metabolites: NEGATIVE
Creatinine,U: 217.65 mg/dL
Methadone: NEGATIVE
Opiates: POSITIVE — AB
Phencyclidine (PCP): NEGATIVE
Propoxyphene: NEGATIVE

## 2013-04-10 ENCOUNTER — Encounter: Payer: Self-pay | Admitting: Infectious Disease

## 2013-04-11 ENCOUNTER — Ambulatory Visit (INDEPENDENT_AMBULATORY_CARE_PROVIDER_SITE_OTHER): Payer: Medicaid Other | Admitting: Internal Medicine

## 2013-04-11 ENCOUNTER — Emergency Department (HOSPITAL_COMMUNITY)
Admission: EM | Admit: 2013-04-11 | Discharge: 2013-04-11 | Disposition: A | Discharge status: Home / Self Care | Payer: Medicaid Other | Source: Home / Self Care | Attending: Family Medicine | Admitting: Family Medicine

## 2013-04-11 ENCOUNTER — Encounter (HOSPITAL_COMMUNITY): Payer: Self-pay | Admitting: Emergency Medicine

## 2013-04-11 ENCOUNTER — Encounter: Payer: Self-pay | Admitting: Internal Medicine

## 2013-04-11 VITALS — BP 135/94 | HR 87 | Temp 98.3°F | Wt 266.0 lb

## 2013-04-11 DIAGNOSIS — L02211 Cutaneous abscess of abdominal wall: Secondary | ICD-10-CM

## 2013-04-11 DIAGNOSIS — L02219 Cutaneous abscess of trunk, unspecified: Secondary | ICD-10-CM

## 2013-04-11 DIAGNOSIS — M549 Dorsalgia, unspecified: Secondary | ICD-10-CM

## 2013-04-11 DIAGNOSIS — L0291 Cutaneous abscess, unspecified: Secondary | ICD-10-CM

## 2013-04-11 MED ORDER — DOXYCYCLINE HYCLATE 100 MG PO TABS: 100.0000 mg | ORAL_TABLET | Freq: Two times a day (BID) | ORAL | Status: DC

## 2013-04-11 MED ORDER — OXYCODONE-ACETAMINOPHEN 7.5-325 MG PO TABS: 1.0000 | ORAL_TABLET | Freq: Four times a day (QID) | ORAL | Status: DC | PRN

## 2013-04-11 NOTE — Progress Notes (Signed)
[facility] CLINIC NOTE  RFV: sick visit for MRSA abscess Subjective:    Patient ID: [name], male    DOB: [birth year], 42 y.o.   MRN: [id number]  HPI  [name], M -> F, with HIV, Cd 4 count 2450/VL <20, on Atripla. Reports having an abscess develop and spontaneously draining purulent foul smelling exudate from left inguinal area. Has been using soap and water but no other ointment or medications. Has had abscesses like this in the past. No fever, or chills, or nightsweats  Current Outpatient Prescriptions on File Prior to Visit  Medication Sig Dispense Refill  . albuterol (PROVENTIL HFA) 108 (90 BASE) MCG/ACT inhaler Inhale 2 puffs into the lungs every 6 (six) hours as needed.  1 Inhaler  3  . alprazolam (XANAX) 2 MG tablet Take 1 tablet (2 mg total) by mouth 2 (two) times daily as needed for sleep.  60 tablet  4  . efavirenz-emtricitabine-tenofovir (ATRIPLA) 600-200-300 MG per tablet Take 1 tablet by mouth at bedtime.  30 tablet  11  . escitalopram (LEXAPRO) 20 MG tablet Take 1 tablet (20 mg total) by mouth daily.  30 tablet  6  . Fluticasone-Salmeterol (ADVAIR) 100-50 MCG/DOSE AEPB Inhale 1 puff into the lungs 2 (two) times daily.  60 each  3  . imiquimod (ALDARA) 5 % cream Apply topically 3 (three) times a week.        . metFORMIN (GLUCOPHAGE) 500 MG tablet Take 1 tablet (500 mg total) by mouth daily.  30 tablet  11  . morphine (MS CONTIN) 30 MG 12 hr tablet Take one tablet by mouth in the morning & 2 tablets in the evening. Dispense 90 tablets per month.  90 tablet  0  . omeprazole (PRILOSEC) 20 MG capsule Take 1 capsule (20 mg total) by mouth 2 (two) times daily.  60 capsule  6  . ondansetron (ZOFRAN) 4 MG tablet Take 4 mg by mouth every 8 (eight) hours as needed.        Marland Kitchen spironolactone (ALDACTONE) 50 MG tablet Take 1 tablet (50 mg total) by mouth daily.  30 tablet  11   No current facility-administered medications on file prior to visit.   Active Ambulatory Problems   Diagnosis Date Noted  . HIV DISEASE 04/14/2006  . 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 HYPOKALEMIA 09/27/2008  . ANXIETY STATE, UNSPECIFIED 11/04/2008  . GENDER IDENTITY DISORDER 10/31/2007  . DEPRESSION 10/17/2007  . CHRONIC PAIN SYNDROME 06/11/2009  . Acute bronchitis 03/02/2007  . ABSCESS, PARAPHARYNGEAL 12/08/2006  . ASTHMA, UNSPECIFIED, UNSPECIFIED STATUS 02/07/2008  . FURUNCLE 06/20/2007  . CELLULITIS AND ABSCESS OF OTHER SPECIFIED SITE 10/17/2007  . HIDRADENITIS SUPPURATIVA 04/14/2006  . 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 SHOULDER PAIN, CHRONIC 01/13/2009  . LUMBAGO 02/07/2008  . ROTATOR CUFF INJURY, RIGHT SHOULDER 11/04/2008  . SWELLING MASS OR LUMP IN HEAD AND NECK 09/27/2008  . PRODUCTIVE COUGH 04/24/2007  . CHEST PAIN 09/27/2008  . DIARRHEA 03/02/2007  . DIABETES MELLITUS, BORDERLINE 09/09/2008  . TRANSAMINASES, SERUM, ELEVATED 09/27/2008  . Smoker 03/07/2011  . Cervicalgia 03/07/2011  . Scabies 07/20/2011  . Rash 02/14/2012  . Parotitis 12/03/2012  . Conjunctivitis 12/03/2012  . Hearing loss secondary to cerumen impaction 02/07/2013   Resolved Ambulatory Problems    Diagnosis Date Noted  . OTITIS MEDIA, BILATERAL 07/28/2009   Past Medical History  Diagnosis Date  . Diabetes mellitus   . Hypertension       Review of Systems     Objective:  Physical Exam BP 135/94  Pulse 87  Temp(Src) 98.3 F (36.8 C) (Oral)  Wt 266 lb (120.657 kg)  BMI 37.12 kg/m2 gen = a xo by 4 in mild distress Skin = left inguinal area has a 3 cm shallow ulcer with 2 pin point holes that draining purulent exudate, concern that there is fistulous tract that connects the 2 exit sites. Probing the area with sterile qtip causing him some discomfort. Does have mild surrrounding erythema to the shallow ulcer. Mild induration in the area       Assessment & Plan:  Skin/soft tissue infection = will give rx of percocet and bactrim DS 2 BID x 10 d. Recommend to go to urgent care  for I x D since unable to do so in the office

## 2013-04-11 NOTE — ED Notes (Signed)
Patient has open wound to left lower abdomen.  Patient reports being seen at dr Paulette [facility] office today and told to come to [facility] for an I/D.  Onset of boil 5 days ago, reports boil started draining 2 days ago.

## 2013-04-11 NOTE — ED Provider Notes (Signed)
CSN: [id number]     Arrival date & time 04/11/13  1622 History   First MD Initiated Contact with Patient 04/11/13 1712     Chief Complaint  Patient presents with  . Abscess   (Consider location/radiation/quality/duration/timing/severity/associated sxs/prior Treatment) Patient is a 43 y.o. male presenting with abscess. The history is provided by the patient.  Abscess Location:  Torso Torso abscess location:  Abd LLQ Abscess quality: not draining, no fluctuance, no induration, no redness and not weeping   Red streaking: no   Duration:  5 days (draining spont for 2 d.) Progression:  Improving Chronicity:  New Context: immunosuppression   Associated symptoms: no fever     Past Medical History  Diagnosis Date  . Diabetes mellitus   . Hypertension    Past Surgical History  Procedure Laterality Date  . None     No family history on file. History  Substance Use Topics  . Smoking status: Current Every Day Smoker -- 0.50 packs/day for 27 years    Types: Cigarettes  . Smokeless tobacco: Never Used  . Alcohol Use: No    Review of Systems  Constitutional: Negative.  Negative for fever.  Gastrointestinal: Negative.   Musculoskeletal: Negative.   Skin: Positive for rash.    Allergies  Propoxyphene-acetaminophen  Home Medications   Current Outpatient Rx  Name  Route  Sig  Dispense  Refill  . albuterol (PROVENTIL HFA) 108 (90 BASE) MCG/ACT inhaler   Inhalation   Inhale 2 puffs into the lungs every 6 (six) hours as needed.   1 Inhaler   3   . alprazolam (XANAX) 2 MG tablet   Oral   Take 1 tablet (2 mg total) by mouth 2 (two) times daily as needed for sleep.   60 tablet   4   . doxycycline (VIBRA-TABS) 100 MG tablet   Oral   Take 1 tablet (100 mg total) by mouth 2 (two) times daily.   20 tablet   0   . efavirenz-emtricitabine-tenofovir (ATRIPLA) 600-200-300 MG per tablet   Oral   Take 1 tablet by mouth at bedtime.   30 tablet   11   . escitalopram (LEXAPRO)  20 MG tablet   Oral   Take 1 tablet (20 mg total) by mouth daily.   30 tablet   6   . Fluticasone-Salmeterol (ADVAIR) 100-50 MCG/DOSE AEPB   Inhalation   Inhale 1 puff into the lungs 2 (two) times daily.   60 each   3   . imiquimod (ALDARA) 5 % cream   Topical   Apply topically 3 (three) times a week.           . metFORMIN (GLUCOPHAGE) 500 MG tablet   Oral   Take 1 tablet (500 mg total) by mouth daily.   30 tablet   11   . morphine (MS CONTIN) 30 MG 12 hr tablet      Take one tablet by mouth in the morning & 2 tablets in the evening. Dispense 90 tablets per month.   90 tablet   0   . omeprazole (PRILOSEC) 20 MG capsule   Oral   Take 1 capsule (20 mg total) by mouth 2 (two) times daily.   60 capsule   6   . ondansetron (ZOFRAN) 4 MG tablet   Oral   Take 4 mg by mouth every 8 (eight) hours as needed.           Marland Kitchen oxyCODONE-acetaminophen (PERCOCET) 7.5-325 MG per tablet  Oral   Take 1 tablet by mouth every 6 (six) hours as needed.   30 tablet   0   . spironolactone (ALDACTONE) 50 MG tablet   Oral   Take 1 tablet (50 mg total) by mouth daily.   30 tablet   11    BP 128/83  Pulse 69  Temp(Src) 97.5 F (36.4 C) (Oral)  Resp 20  SpO2 98% Physical Exam  Nursing note and vitals reviewed. Constitutional: He appears well-developed and well-nourished.  Skin: Skin is warm and dry. No erythema.  Open abscess site on llabd, no drainage expressed, no erythema.    ED Course  INCISION AND DRAINAGE Date/Time: 04/11/2013 5:34 PM Performed by: Linna Hoff Authorized by: Bradd Canary D Consent: Verbal consent obtained. Risks and benefits: risks, benefits and alternatives were discussed Consent given by: patient Type: abscess Body area: trunk Location details: abdomen Local anesthetic: topical anesthetic Patient sedated: no Scalpel size: 11 Incision type: single straight Complexity: simple Drainage amount: scant Wound treatment: wound left  open Patient tolerance: Patient tolerated the procedure well with no immediate complications.   (including critical care time) Labs Review Labs Reviewed - No data to display Imaging Review No results found.    MDM  I+d performed    Linna Hoff, MD 04/11/13 [phone]

## 2013-04-12 ENCOUNTER — Telehealth: Payer: Self-pay | Admitting: Licensed Clinical Social Worker

## 2013-04-12 NOTE — Telephone Encounter (Signed)
Pharmacist called because she received a prescription for percocet yesterday after [name] just had one filled on 10/14. [name] was seen yesterday by Dr. Drue Second for an abscess and given 30 pills of percocet. Per Dr. Drue Second she was unaware at the time that [name] just picked them up on 10/14. She stated that [name] could have the additional 30 but that will be it. Pharmacist agreed to run it with insurance but it may not be covered.

## 2013-04-29 ENCOUNTER — Encounter: Payer: Self-pay | Admitting: Infectious Disease

## 2013-04-29 ENCOUNTER — Other Ambulatory Visit: Payer: Self-pay | Admitting: *Deleted

## 2013-04-29 ENCOUNTER — Telehealth: Payer: Self-pay | Admitting: *Deleted

## 2013-04-29 DIAGNOSIS — G894 Chronic pain syndrome: Secondary | ICD-10-CM

## 2013-04-29 DIAGNOSIS — L0291 Cutaneous abscess, unspecified: Secondary | ICD-10-CM

## 2013-04-29 MED ORDER — MORPHINE SULFATE ER 30 MG PO TBCR: EXTENDED_RELEASE_TABLET | ORAL | Status: DC

## 2013-04-29 MED ORDER — OXYCODONE-ACETAMINOPHEN 7.5-325 MG PO TABS: 1.0000 | ORAL_TABLET | Freq: Four times a day (QID) | ORAL | Status: DC | PRN

## 2013-04-29 NOTE — Telephone Encounter (Signed)
Rx for percocet printed in error, quantity for 30 was incorrect, new Rx printed for #120. First Rx shredded Wendall Mola

## 2013-05-24 ENCOUNTER — Encounter: Payer: Self-pay | Admitting: Infectious Disease

## 2013-05-27 ENCOUNTER — Other Ambulatory Visit: Payer: Self-pay | Admitting: *Deleted

## 2013-05-27 DIAGNOSIS — G894 Chronic pain syndrome: Secondary | ICD-10-CM

## 2013-05-27 DIAGNOSIS — L0291 Cutaneous abscess, unspecified: Secondary | ICD-10-CM

## 2013-05-27 MED ORDER — MORPHINE SULFATE ER 30 MG PO TBCR: EXTENDED_RELEASE_TABLET | ORAL | Status: DC

## 2013-05-27 MED ORDER — OXYCODONE-ACETAMINOPHEN 7.5-325 MG PO TABS: 1.0000 | ORAL_TABLET | Freq: Four times a day (QID) | ORAL | Status: DC | PRN

## 2013-06-25 ENCOUNTER — Encounter: Payer: Self-pay | Admitting: Infectious Disease

## 2013-06-26 ENCOUNTER — Other Ambulatory Visit: Payer: Medicaid Other

## 2013-06-26 ENCOUNTER — Other Ambulatory Visit: Payer: Self-pay | Admitting: *Deleted

## 2013-06-26 ENCOUNTER — Encounter: Payer: Self-pay | Admitting: Infectious Disease

## 2013-06-26 DIAGNOSIS — L0390 Cellulitis, unspecified: Secondary | ICD-10-CM

## 2013-06-26 DIAGNOSIS — G894 Chronic pain syndrome: Secondary | ICD-10-CM

## 2013-06-26 DIAGNOSIS — L0291 Cutaneous abscess, unspecified: Secondary | ICD-10-CM

## 2013-06-26 DIAGNOSIS — J45909 Unspecified asthma, uncomplicated: Secondary | ICD-10-CM

## 2013-06-26 DIAGNOSIS — Z79899 Other long term (current) drug therapy: Secondary | ICD-10-CM

## 2013-06-26 MED ORDER — MORPHINE SULFATE ER 30 MG PO TBCR: EXTENDED_RELEASE_TABLET | ORAL | Status: DC

## 2013-06-26 MED ORDER — OXYCODONE-ACETAMINOPHEN 7.5-325 MG PO TABS: 1.0000 | ORAL_TABLET | Freq: Four times a day (QID) | ORAL | Status: DC | PRN

## 2013-06-26 MED ORDER — ALBUTEROL SULFATE HFA 108 (90 BASE) MCG/ACT IN AERS: 2.0000 | INHALATION_SPRAY | Freq: Four times a day (QID) | RESPIRATORY_TRACT | Status: DC | PRN

## 2013-06-27 LAB — DRUG SCREEN, URINE
Amphetamine Screen, Ur: NEGATIVE
Barbiturate Quant, Ur: NEGATIVE
Benzodiazepines.: NEGATIVE
COCAINE METABOLITES: NEGATIVE
Creatinine,U: 255.58 mg/dL
Marijuana Metabolite: POSITIVE — AB
Methadone: NEGATIVE
OPIATES: POSITIVE — AB
PHENCYCLIDINE (PCP): NEGATIVE
PROPOXYPHENE: NEGATIVE

## 2013-07-24 ENCOUNTER — Ambulatory Visit (INDEPENDENT_AMBULATORY_CARE_PROVIDER_SITE_OTHER): Payer: Medicaid Other | Admitting: Infectious Diseases

## 2013-07-24 ENCOUNTER — Other Ambulatory Visit: Payer: Self-pay | Admitting: *Deleted

## 2013-07-24 ENCOUNTER — Ambulatory Visit (INDEPENDENT_AMBULATORY_CARE_PROVIDER_SITE_OTHER): Payer: Self-pay | Admitting: *Deleted

## 2013-07-24 ENCOUNTER — Encounter: Payer: Self-pay | Admitting: Infectious Diseases

## 2013-07-24 VITALS — BP 153/95 | HR 102 | Temp 98.1°F | Ht 72.0 in | Wt 264.0 lb

## 2013-07-24 VITALS — Wt 264.2 lb

## 2013-07-24 DIAGNOSIS — Z21 Asymptomatic human immunodeficiency virus [HIV] infection status: Secondary | ICD-10-CM

## 2013-07-24 DIAGNOSIS — Z23 Encounter for immunization: Secondary | ICD-10-CM

## 2013-07-24 DIAGNOSIS — F17200 Nicotine dependence, unspecified, uncomplicated: Secondary | ICD-10-CM

## 2013-07-24 DIAGNOSIS — G894 Chronic pain syndrome: Secondary | ICD-10-CM

## 2013-07-24 DIAGNOSIS — L0292 Furuncle, unspecified: Secondary | ICD-10-CM

## 2013-07-24 DIAGNOSIS — L0291 Cutaneous abscess, unspecified: Secondary | ICD-10-CM

## 2013-07-24 DIAGNOSIS — L0390 Cellulitis, unspecified: Secondary | ICD-10-CM

## 2013-07-24 DIAGNOSIS — B20 Human immunodeficiency virus [HIV] disease: Secondary | ICD-10-CM

## 2013-07-24 DIAGNOSIS — B353 Tinea pedis: Secondary | ICD-10-CM | POA: Insufficient documentation

## 2013-07-24 DIAGNOSIS — L0293 Carbuncle, unspecified: Secondary | ICD-10-CM

## 2013-07-24 MED ORDER — OXYCODONE-ACETAMINOPHEN 7.5-325 MG PO TABS: 1.0000 | ORAL_TABLET | Freq: Four times a day (QID) | ORAL | Status: DC | PRN

## 2013-07-24 MED ORDER — MORPHINE SULFATE ER 30 MG PO TBCR: EXTENDED_RELEASE_TABLET | ORAL | Status: DC

## 2013-07-24 MED ORDER — CLOTRIMAZOLE-BETAMETHASONE 1-0.05 % EX CREA: 1.0000 "application " | TOPICAL_CREAM | Freq: Two times a day (BID) | CUTANEOUS | Status: DC

## 2013-07-24 MED ORDER — NICOTINE 14 MG/24HR TD PT24: 14.0000 mg | MEDICATED_PATCH | TRANSDERMAL | Status: DC

## 2013-07-24 NOTE — Assessment & Plan Note (Signed)
otc antifungal with steroid. Keep feet clean and dry. Fresh socks daily. Keep feet open to air as much as possible.

## 2013-07-24 NOTE — Progress Notes (Signed)
[name] is here for A5322 HAILO study, week 48.  She was seen by Dr. Ninetta LightsHatcher for blisters on soles of feet bilat and prescribed an antifungal cream. She denies any other symptoms. Fasting labs were drawn with no problems. Urine obtained. Questionnaires and Neuro tests performed. She received $50 gift card for visit. Next appointment scheduled for Wednesday, December 11, 2013 @ 9am. Tacey HeapElisha Zyrah Wiswell RN

## 2013-07-24 NOTE — Progress Notes (Signed)
   Subjective:    Patient ID: [name] OleaSteven A [name], male    DOB: [birth year], 43 y.o.   MRN: [id number]  HPI 43 yo, M -> F, HIV+ followed in [facility]. Last CD4 1920, VL 21 (8.28.14), on Atripla. No problems with ART.  Seen in clinic 03-2013 with inguinal abscess. Since resolved.  Has been having blisters on soles of feet for several months. Has tried no OTCs. Has "sticky drainage".  Ha had thickened skin on L knee, feels like something is wiggling underneath.   Review of Systems  Constitutional: Negative for fever, chills, appetite change and unexpected weight change.  Gastrointestinal: Negative for diarrhea and constipation.  Genitourinary: Negative for difficulty urinating.  Skin: Positive for rash.       Objective:   Physical Exam  Constitutional: He appears well-developed and well-nourished.  HENT:  Mouth/Throat: No oropharyngeal exudate.  Eyes: EOM are normal. Pupils are equal, round, and reactive to light.  Neck: Neck supple.  Cardiovascular: Normal rate, regular rhythm and normal heart sounds.   Pulmonary/Chest: Effort normal and breath sounds normal.  Abdominal: Soft. Bowel sounds are normal. He exhibits no distension. There is no tenderness.  Lymphadenopathy:    He has no cervical adenopathy.  Skin:             Assessment & Plan:

## 2013-07-24 NOTE — Assessment & Plan Note (Signed)
Will start on nicotine replacement.

## 2013-07-24 NOTE — Assessment & Plan Note (Signed)
Appears to be doing well. Will f/u at [facility] and get labs there. Will restart hep B series. Given condoms.

## 2013-07-24 NOTE — Assessment & Plan Note (Signed)
Resolved

## 2013-07-24 NOTE — Addendum Note (Signed)
Addended by: Wendall MolaOCKERHAM, JACQUELINE A on: 07/24/2013 11:24 AM   Modules accepted: Orders

## 2013-07-25 LAB — HIV-1 RNA QUANT-NO REFLEX-BLD
HIV 1 RNA Quant: 24 copies/mL — ABNORMAL HIGH (ref <20)
HIV-1 RNA Quant, Log: 1.38 {Log} — ABNORMAL HIGH (ref <1.30)

## 2013-07-25 LAB — CBC WITH DIFFERENTIAL/PLATELET
Basophils Absolute: 0 10*3/uL (ref 0.0–0.1)
Basophils Relative: 0 % (ref 0–1)
Eosinophils Absolute: 0.2 10*3/uL (ref 0.0–0.7)
Eosinophils Relative: 2 % (ref 0–5)
HEMATOCRIT: 51.7 % (ref 39.0–52.0)
HEMOGLOBIN: 17.8 g/dL — AB (ref 13.0–17.0)
LYMPHS PCT: 43 % (ref 12–46)
Lymphs Abs: 5.6 10*3/uL — ABNORMAL HIGH (ref 0.7–4.0)
MCH: 32.8 pg (ref 26.0–34.0)
MCHC: 34.4 g/dL (ref 30.0–36.0)
MCV: 95.2 fL (ref 78.0–100.0)
Monocytes Absolute: 1.1 10*3/uL — ABNORMAL HIGH (ref 0.1–1.0)
Monocytes Relative: 8 % (ref 3–12)
NEUTROS ABS: 6.2 10*3/uL (ref 1.7–7.7)
NEUTROS PCT: 47 % (ref 43–77)
Platelets: 325 10*3/uL (ref 150–400)
RBC: 5.43 MIL/uL (ref 4.22–5.81)
RDW: 13.2 % (ref 11.5–15.5)
WBC: 13.1 10*3/uL — AB (ref 4.0–10.5)

## 2013-07-25 LAB — CREATININE, URINE, RANDOM: CREATININE, URINE: 473.8 mg/dL

## 2013-07-25 LAB — COMPREHENSIVE METABOLIC PANEL
ALT: 41 U/L (ref 0–53)
AST: 29 U/L (ref 0–37)
Albumin: 4.6 g/dL (ref 3.5–5.2)
Alkaline Phosphatase: 112 U/L (ref 39–117)
BILIRUBIN TOTAL: 0.5 mg/dL (ref 0.2–1.2)
BUN: 10 mg/dL (ref 6–23)
CO2: 27 mEq/L (ref 19–32)
CREATININE: 0.86 mg/dL (ref 0.50–1.35)
Calcium: 9.6 mg/dL (ref 8.4–10.5)
Chloride: 102 mEq/L (ref 96–112)
Glucose, Bld: 106 mg/dL — ABNORMAL HIGH (ref 70–99)
Potassium: 4.2 mEq/L (ref 3.5–5.3)
SODIUM: 137 meq/L (ref 135–145)
TOTAL PROTEIN: 7.4 g/dL (ref 6.0–8.3)

## 2013-07-25 LAB — LIPID PANEL
Cholesterol: 185 mg/dL (ref 0–200)
HDL: 37 mg/dL — ABNORMAL LOW (ref >39)
LDL Cholesterol: 113 mg/dL — ABNORMAL HIGH (ref 0–99)
Total CHOL/HDL Ratio: 5 Ratio
Triglycerides: 176 mg/dL — ABNORMAL HIGH (ref <150)
VLDL: 35 mg/dL (ref 0–40)

## 2013-07-25 LAB — T-HELPER CELL (CD4) - (RCID CLINIC ONLY)
CD4 % Helper T Cell: 46 % (ref 33–55)
CD4 T CELL ABS: 2840 /uL — AB (ref 400–2700)

## 2013-07-25 LAB — PROTEIN, URINE, RANDOM: TOTAL PROTEIN, URINE: 84 mg/dL

## 2013-07-25 LAB — HEMOGLOBIN A1C
Hgb A1c MFr Bld: 5.9 % — ABNORMAL HIGH (ref <5.7)
MEAN PLASMA GLUCOSE: 123 mg/dL — AB (ref <117)

## 2013-08-21 ENCOUNTER — Ambulatory Visit: Payer: Medicaid Other

## 2013-08-22 ENCOUNTER — Ambulatory Visit (INDEPENDENT_AMBULATORY_CARE_PROVIDER_SITE_OTHER): Payer: Medicaid Other | Admitting: *Deleted

## 2013-08-22 DIAGNOSIS — Z23 Encounter for immunization: Secondary | ICD-10-CM

## 2013-08-26 ENCOUNTER — Other Ambulatory Visit: Payer: Self-pay | Admitting: Licensed Clinical Social Worker

## 2013-08-26 ENCOUNTER — Other Ambulatory Visit: Payer: Self-pay | Admitting: *Deleted

## 2013-08-26 DIAGNOSIS — G894 Chronic pain syndrome: Secondary | ICD-10-CM

## 2013-08-26 DIAGNOSIS — L0390 Cellulitis, unspecified: Principal | ICD-10-CM

## 2013-08-26 DIAGNOSIS — L0291 Cutaneous abscess, unspecified: Secondary | ICD-10-CM

## 2013-08-26 MED ORDER — OXYCODONE-ACETAMINOPHEN 7.5-325 MG PO TABS: 1.0000 | ORAL_TABLET | Freq: Four times a day (QID) | ORAL | Status: DC | PRN

## 2013-08-26 MED ORDER — MORPHINE SULFATE ER 30 MG PO TBCR: EXTENDED_RELEASE_TABLET | ORAL | Status: DC

## 2013-09-07 ENCOUNTER — Encounter (HOSPITAL_COMMUNITY): Payer: Self-pay | Admitting: Emergency Medicine

## 2013-09-07 ENCOUNTER — Emergency Department (HOSPITAL_COMMUNITY)
Admission: EM | Admit: 2013-09-07 | Discharge: 2013-09-07 | Disposition: A | Discharge status: Home Health | Payer: Medicaid Other | Attending: Emergency Medicine | Admitting: Emergency Medicine

## 2013-09-07 ENCOUNTER — Emergency Department (HOSPITAL_COMMUNITY): Payer: Medicaid Other

## 2013-09-07 DIAGNOSIS — E119 Type 2 diabetes mellitus without complications: Secondary | ICD-10-CM | POA: Insufficient documentation

## 2013-09-07 DIAGNOSIS — F17200 Nicotine dependence, unspecified, uncomplicated: Secondary | ICD-10-CM | POA: Insufficient documentation

## 2013-09-07 DIAGNOSIS — R209 Unspecified disturbances of skin sensation: Secondary | ICD-10-CM | POA: Insufficient documentation

## 2013-09-07 DIAGNOSIS — M549 Dorsalgia, unspecified: Secondary | ICD-10-CM

## 2013-09-07 DIAGNOSIS — Y92009 Unspecified place in unspecified non-institutional (private) residence as the place of occurrence of the external cause: Secondary | ICD-10-CM | POA: Insufficient documentation

## 2013-09-07 DIAGNOSIS — Z8614 Personal history of Methicillin resistant Staphylococcus aureus infection: Secondary | ICD-10-CM | POA: Insufficient documentation

## 2013-09-07 DIAGNOSIS — W19XXXA Unspecified fall, initial encounter: Secondary | ICD-10-CM

## 2013-09-07 DIAGNOSIS — Y9301 Activity, walking, marching and hiking: Secondary | ICD-10-CM | POA: Insufficient documentation

## 2013-09-07 DIAGNOSIS — W108XXA Fall (on) (from) other stairs and steps, initial encounter: Secondary | ICD-10-CM | POA: Insufficient documentation

## 2013-09-07 DIAGNOSIS — W010XXA Fall on same level from slipping, tripping and stumbling without subsequent striking against object, initial encounter: Secondary | ICD-10-CM | POA: Insufficient documentation

## 2013-09-07 DIAGNOSIS — IMO0002 Reserved for concepts with insufficient information to code with codable children: Secondary | ICD-10-CM | POA: Insufficient documentation

## 2013-09-07 DIAGNOSIS — J45901 Unspecified asthma with (acute) exacerbation: Secondary | ICD-10-CM | POA: Insufficient documentation

## 2013-09-07 DIAGNOSIS — I10 Essential (primary) hypertension: Secondary | ICD-10-CM | POA: Insufficient documentation

## 2013-09-07 DIAGNOSIS — J45909 Unspecified asthma, uncomplicated: Secondary | ICD-10-CM

## 2013-09-07 DIAGNOSIS — Z79899 Other long term (current) drug therapy: Secondary | ICD-10-CM | POA: Insufficient documentation

## 2013-09-07 DIAGNOSIS — Z21 Asymptomatic human immunodeficiency virus [HIV] infection status: Secondary | ICD-10-CM | POA: Insufficient documentation

## 2013-09-07 HISTORY — DX: Human immunodeficiency virus (HIV) disease: B20

## 2013-09-07 HISTORY — DX: Carrier or suspected carrier of methicillin resistant Staphylococcus aureus: Z22.322

## 2013-09-07 MED ORDER — ALBUTEROL SULFATE HFA 108 (90 BASE) MCG/ACT IN AERS
INHALATION_SPRAY | RESPIRATORY_TRACT | Status: AC
  Filled 2013-09-07: qty 6.7

## 2013-09-07 MED ORDER — ALBUTEROL SULFATE HFA 108 (90 BASE) MCG/ACT IN AERS
2.0000 | INHALATION_SPRAY | Freq: Once | RESPIRATORY_TRACT | Status: AC
  Administered 2013-09-07: 2 via RESPIRATORY_TRACT

## 2013-09-07 MED ORDER — OXYCODONE-ACETAMINOPHEN 5-325 MG PO TABS
2.0000 | ORAL_TABLET | Freq: Once | ORAL | Status: AC
  Administered 2013-09-07: 2 via ORAL
  Filled 2013-09-07: qty 2

## 2013-09-07 NOTE — ED Notes (Signed)
Pt states he will call someone to pick him up

## 2013-09-07 NOTE — ED Notes (Signed)
Delay explained to pt.  Provider states she will see patient next.  Pt states that he states  90 mg of morphine everyday and is in the pain management program.  States he is going to leave AMA if he does not get a shot of morphine. Explained to pt that he drove him self to the ED and that we can not give him narcotics and let him drive.  Pt is very angry and states that he takes Morphine everyday

## 2013-09-07 NOTE — ED Notes (Signed)
[facility]

## 2013-09-07 NOTE — ED Notes (Signed)
Pt states he was walking down stairs in bedroom slippers and slipped landing on butt/lower back area. He c/o lower back and R leg pain since.

## 2013-09-07 NOTE — ED Notes (Signed)
PT reports he fell down 8 steps at home last night. Pt also reported he took morphine for pain and slept .PT also reports when he woke up this AM he took more morphine and slept some more, Pt has come to the ED because he still has pain 9/10 in RT leg and back.

## 2013-09-07 NOTE — ED Provider Notes (Signed)
CSN: [id number]     Arrival date & time 09/07/13  1757 History  This chart was scribed for non-physician practitioner, Coral CeoJessica Akelia Husted, PA-C working with Nelia Shiobert L Beaton, MD by Greggory StallionKayla Andersen, ED scribe. This patient was seen in room TR10C/TR10C and the patient's care was started at 7:51 PM.   Chief Complaint  Patient presents with  . Fall   The history is provided by the patient. No language interpreter was used.   HPI Comments: [name] is a 43 y.o. male with history of asthma, DM, HTN, and HIV who presents to the Emergency Department complaining of a fall that occurred earlier today. Pt slipped while walking down the steps and landed on his buttock and lower back. Denies hitting his head or LOC. He has sudden onset constant lower, right back pain that radiates into his right leg with associated mild numbness. Pain similar to his chronic back pain, however, is worsened after injury. Pt took morphine at home for pain with little relief. Movement worsens the pain. Denies abdominal pain, neck pain, nausea, emesis, bowel or bladder incontinence, headache, weakness, or loss of sensation. Pt states he is not with pain management but takes 90 mg of morphine per day for chronic back pain prescribed by his ID doctor. He states he needs a refill on his inhaler. Has mild wheezing from his chronic asthma. No SOB or chest pain.     Past Medical History  Diagnosis Date  . Diabetes mellitus   . Hypertension   . HIV disease   . MRSA carrier    Past Surgical History  Procedure Laterality Date  . None     History reviewed. No pertinent family history. History  Substance Use Topics  . Smoking status: Current Every Day Smoker -- 0.50 packs/day for 27 years    Types: Cigarettes  . Smokeless tobacco: Never Used     Comment: "always trying to stop" interested in the patch  . Alcohol Use: No    Review of Systems  Constitutional: Negative for fatigue.  Eyes: Negative for photophobia and visual  disturbance.  Respiratory: Positive for wheezing. Negative for cough and shortness of breath.   Cardiovascular: Negative for chest pain and leg swelling.  Gastrointestinal: Negative for nausea, vomiting and abdominal pain.  Genitourinary:       Negative for bowel or bladder incontinence.  Musculoskeletal: Positive for back pain. Negative for gait problem, joint swelling, myalgias, neck pain and neck stiffness.  Skin: Negative for wound.  Neurological: Positive for numbness. Negative for dizziness, syncope, weakness and headaches.  Psychiatric/Behavioral: Negative for confusion.  All other systems reviewed and are negative.   Allergies  Propoxyphene n-acetaminophen  Home Medications   Current Outpatient Rx  Name  Route  Sig  Dispense  Refill  . albuterol (PROVENTIL HFA) 108 (90 BASE) MCG/ACT inhaler   Inhalation   Inhale 2 puffs into the lungs every 6 (six) hours as needed.   1 Inhaler   3   . alprazolam (XANAX) 2 MG tablet   Oral   Take 1 tablet (2 mg total) by mouth 2 (two) times daily as needed for sleep.   60 tablet   4   . clotrimazole-betamethasone (LOTRISONE) cream   Topical   Apply 1 application topically 2 (two) times daily.   30 g   0   . efavirenz-emtricitabine-tenofovir (ATRIPLA) 600-200-300 MG per tablet   Oral   Take 1 tablet by mouth at bedtime.   30 tablet   11   .  escitalopram (LEXAPRO) 20 MG tablet   Oral   Take 1 tablet (20 mg total) by mouth daily.   30 tablet   6   . Fluticasone-Salmeterol (ADVAIR) 100-50 MCG/DOSE AEPB   Inhalation   Inhale 1 puff into the lungs 2 (two) times daily.   60 each   3   . imiquimod (ALDARA) 5 % cream   Topical   Apply topically 3 (three) times a week.           . metFORMIN (GLUCOPHAGE) 500 MG tablet   Oral   Take 1 tablet (500 mg total) by mouth daily.   30 tablet   11   . morphine (MS CONTIN) 30 MG 12 hr tablet      Take one tablet by mouth in the morning & 2 tablets in the evening. Dispense 90  tablets per month.   90 tablet   0   . nicotine (NICODERM CQ - DOSED IN MG/24 HOURS) 14 mg/24hr patch   Transdermal   Place 1 patch (14 mg total) onto the skin daily.   42 patch   0   . omeprazole (PRILOSEC) 20 MG capsule   Oral   Take 1 capsule (20 mg total) by mouth 2 (two) times daily.   60 capsule   6   . ondansetron (ZOFRAN) 4 MG tablet   Oral   Take 4 mg by mouth every 8 (eight) hours as needed.           Marland Kitchen oxyCODONE-acetaminophen (PERCOCET) 7.5-325 MG per tablet   Oral   Take 1 tablet by mouth every 6 (six) hours as needed.   120 tablet   0   . spironolactone (ALDACTONE) 50 MG tablet   Oral   Take 1 tablet (50 mg total) by mouth daily.   30 tablet   11    BP 130/86  Pulse 105  Temp(Src) 97.7 F (36.5 C) (Oral)  Resp 22  Ht 5\' 11"  (1.803 m)  Wt 258 lb (117.028 kg)  BMI 36.00 kg/m2  SpO2 99%  Filed Vitals:   09/07/13 1802 09/07/13 2123  BP: 130/86 132/84  Pulse: 105 88  Temp: 97.7 F (36.5 C) 98.4 F (36.9 C)  TempSrc: Oral Oral  Resp: 22 18  Height: 5\' 11"  (1.803 m)   Weight: 258 lb (117.028 kg)   SpO2: 99% 98%    Physical Exam  Nursing note and vitals reviewed. Constitutional: He is oriented to person, place, and time. He appears well-developed and well-nourished. No distress.  HENT:  Head: Normocephalic and atraumatic.  Right Ear: External ear normal.  Left Ear: External ear normal.  Nose: Nose normal.  Mouth/Throat: Oropharynx is clear and moist. No oropharyngeal exudate.  No tenderness to the scalp or face throughout. No palpable hematoma, step-offs, or lacerations throughout.  Tympanic membranes gray and translucent bilaterally.    Eyes: Conjunctivae and EOM are normal. Pupils are equal, round, and reactive to light. Right eye exhibits no discharge. Left eye exhibits no discharge.  Neck: Normal range of motion. Neck supple. No tracheal deviation present.  No cervical spinal or paraspinal tenderness to palpation throughout.  No  limitations with neck ROM.    Cardiovascular: Normal rate, regular rhythm, normal heart sounds and intact distal pulses.  Exam reveals no gallop and no friction rub.   No murmur heard. Dorsalis pedis pulses present and equal bilaterally  Pulmonary/Chest: Effort normal. No respiratory distress. He has wheezes. He has no rales. He exhibits no tenderness.  Mild expiratory wheezing throughout  Abdominal: Soft. He exhibits no distension. There is no tenderness. There is no rebound and no guarding.  Musculoskeletal: Normal range of motion. He exhibits tenderness. He exhibits no edema.       Arms: Tenderness to palpation to the lumbar spine and right paraspinal muscles. Strength 5/5 in the upper and lower extremities bilaterally. Positive straight leg raise on the right. Patient able to ambulate without difficulty or ataxia. No tenderness to palpation to the UE or LE throughout.   Neurological: He is alert and oriented to person, place, and time.  Sensation intact in the UE and LE throughout. Patellar reflexes intact bilaterally.   Skin: Skin is warm. He is not diaphoretic.  Psychiatric: He has a normal mood and affect. His behavior is normal.    ED Course  Procedures (including critical care time)  DIAGNOSTIC STUDIES: Oxygen Saturation is 99% on RA, normal by my interpretation.    COORDINATION OF CARE: 7:56 PM-Discussed treatment plan which includes percocet and xrays with pt at bedside and pt agreed to plan.   Labs Review Labs Reviewed - No data to display Imaging Review Dg Lumbar Spine Complete  09/07/2013   CLINICAL DATA:  Trauma with low back pain.  EXAM: LUMBAR SPINE - COMPLETE 4+ VIEW  COMPARISON:  09/29/2010.  FINDINGS: No evidence of acute fracture or traumatic subluxation. There is grade 2 L5-S1 anterolisthesis related to bilateral pars defects. Degenerative disc narrowing focally at L5-S1.  IMPRESSION: 1. No acute osseous findings. 2. L5 pars defects causing prominent  anterolisthesis.   Electronically Signed   By: Tiburcio Pea M.D.   On: 09/07/2013 20:58     EKG Interpretation None       DG Lumbar Spine Complete (Final result)  Result time: 09/07/13 20:58:54    Final result by Rad Results In Interface (09/07/13 20:58:54)    Narrative:   CLINICAL DATA: Trauma with low back pain.  EXAM: LUMBAR SPINE - COMPLETE 4+ VIEW  COMPARISON: 09/29/2010.  FINDINGS: No evidence of acute fracture or traumatic subluxation. There is grade 2 L5-S1 anterolisthesis related to bilateral pars defects. Degenerative disc narrowing focally at L5-S1.  IMPRESSION: 1. No acute osseous findings. 2. L5 pars defects causing prominent anterolisthesis.   Electronically Signed By: Tiburcio Pea M.D. On: 09/07/2013 20:58    MDM   [name] is a 43 y.o. male with history of asthma, DM, HTN, and HIV who presents to the Emergency Department complaining of a fall that occurred earlier today.  Rechecks  9:15 PM = Pain improved. Ready for discharge.     Patient evaluated for fall with back pain. X-rays showed L5 pars defects with grade 2 anterolisthesis and no acute findings. Reviewed previous imaging and this appears to be chronic. Patient neurovascularly intact. No warning signs or symptoms of back pain including loss of bowel or bladder control, weakness, or saddle anesthesia. No concern for cauda equina or other serious/life threatening cause of back pain. Patient had improvements in his back pain throughout his ED visit. Had mild wheezing on exam. Given albuterol inhaler. Vital signs stable. No hypoxia, respiratory distress, or tachypnea. Instructed patient to follow-up with PCP and continue home pain medication regimen. Return precautions, discharge instructions, and follow-up was discussed with the patient before discharge.     Discharge Medication List as of 09/07/2013  9:21 PM      Final impressions: 1. Fall   2. Back pain   3. Asthma      [name]  Junius Argyle PA-C   I personally performed the services described in this documentation, which was scribed in my presence. The recorded information has been reviewed and is accurate.       Jillyn Ledger, PA-C 09/09/13 [phone]

## 2013-09-07 NOTE — Discharge Instructions (Signed)
Continue to take medications as prescribed at home for pain  Return to the emergency department if you develop any changing/worsening condition, loss of bowel/bladder function, weakness, loss of sensation, difficulty with urination, chest pain, shortness of breath, difficulty breathing not relieved by your inhaler, fever, or any other concerns (please read additional information regarding your condition below)    Back Pain, Adult Low back pain is very common. About 1 in 5 people have back pain.The cause of low back pain is rarely dangerous. The pain often gets better over time.About half of people with a sudden onset of back pain feel better in just 2 weeks. About 8 in 10 people feel better by 6 weeks.  CAUSES Some common causes of back pain include:  Strain of the muscles or ligaments supporting the spine.  Wear and tear (degeneration) of the spinal discs.  Arthritis.  Direct injury to the back. DIAGNOSIS Most of the time, the direct cause of low back pain is not known.However, back pain can be treated effectively even when the exact cause of the pain is unknown.Answering your caregiver's questions about your overall health and symptoms is one of the most accurate ways to make sure the cause of your pain is not dangerous. If your caregiver needs more information, he or she may order lab work or imaging tests (X-rays or MRIs).However, even if imaging tests show changes in your back, this usually does not require surgery. HOME CARE INSTRUCTIONS For many people, back pain returns.Since low back pain is rarely dangerous, it is often a condition that people can learn to [facility] their own.   Remain active. It is stressful on the back to sit or stand in one place. Do not sit, drive, or stand in one place for more than 30 minutes at a time. Take short walks on level surfaces as soon as pain allows.Try to increase the length of time you walk each day.  Do not stay in bed.Resting more than 1  or 2 days can delay your recovery.  Do not avoid exercise or work.Your body is made to move.It is not dangerous to be active, even though your back may hurt.Your back will likely heal faster if you return to being active before your pain is gone.  Pay attention to your body when you bend and lift. Many people have less discomfortwhen lifting if they bend their knees, keep the load close to their bodies,and avoid twisting. Often, the most comfortable positions are those that put less stress on your recovering back.  Find a comfortable position to sleep. Use a firm mattress and lie on your side with your knees slightly bent. If you lie on your back, put a pillow under your knees.  Only take over-the-counter or prescription medicines as directed by your caregiver. Over-the-counter medicines to reduce pain and inflammation are often the most helpful.Your caregiver may prescribe muscle relaxant drugs.These medicines help dull your pain so you can more quickly return to your normal activities and healthy exercise.  Put ice on the injured area.  Put ice in a plastic bag.  Place a towel between your skin and the bag.  Leave the ice on for 15-20 minutes, 03-04 times a day for the first 2 to 3 days. After that, ice and heat may be alternated to reduce pain and spasms.  Ask your caregiver about trying back exercises and gentle massage. This may be of some benefit.  Avoid feeling anxious or stressed.Stress increases muscle tension and can worsen back  pain.It is important to recognize when you are anxious or stressed and learn ways to manage it.Exercise is a great option. SEEK MEDICAL CARE IF:  You have pain that is not relieved with rest or medicine.  You have pain that does not improve in 1 week.  You have new symptoms.  You are generally not feeling well. SEEK IMMEDIATE MEDICAL CARE IF:   You have pain that radiates from your back into your legs.  You develop new bowel or bladder  control problems.  You have unusual weakness or numbness in your arms or legs.  You develop nausea or vomiting.  You develop abdominal pain.  You feel faint. Document Released: 05/30/2005 Document Revised: 11/29/2011 Document Reviewed: 10/18/2010 [facility] Patient Information 2014 [place], [place].  Asthma, Adult Asthma is a recurring condition in which the airways tighten and narrow. Asthma can make it difficult to breathe. It can cause coughing, wheezing, and shortness of breath. Asthma episodes (also called asthma attacks) range from minor to life-threatening. Asthma cannot be cured, but medicines and lifestyle changes can help control it. CAUSES Asthma is believed to be caused by inherited (genetic) and environmental factors, but its exact cause is unknown. Asthma may be triggered by allergens, lung infections, or irritants in the air. Asthma triggers are different for each person. Common triggers include:   Animal dander.  Dust mites.  Cockroaches.  Pollen from trees or grass.  Mold.  Smoke.  Air pollutants such as dust, household cleaners, hair sprays, aerosol sprays, paint fumes, strong chemicals, or strong odors.  Cold air, weather changes, and winds (which increase molds and pollens in the air).  Strong emotional expressions such as crying or laughing hard.  Stress.  Certain medicines (such as aspirin) or types of drugs (such as beta-blockers).  Sulfites in foods and drinks. Foods and drinks that may contain sulfites include dried fruit, potato chips, and sparkling grape juice.  Infections or inflammatory conditions such as the flu, a cold, or an inflammation of the nasal membranes (rhinitis).  Gastroesophageal reflux disease (GERD).  Exercise or strenuous activity. SYMPTOMS Symptoms may occur immediately after asthma is triggered or many hours later. Symptoms include:  Wheezing.  Excessive nighttime or early morning coughing.  Frequent or severe coughing  with a common cold.  Chest tightness.  Shortness of breath. DIAGNOSIS  The diagnosis of asthma is made by a review of your medical history and a physical exam. Tests may also be performed. These may include:  Lung function studies. These tests show how much air you breath in and out.  Allergy tests.  Imaging tests such as X-rays. TREATMENT  Asthma cannot be cured, but it can usually be controlled. Treatment involves identifying and avoiding your asthma triggers. It also involves medicines. There are 2 classes of medicine used for asthma treatment:   Controller medicines. These prevent asthma symptoms from occurring. They are usually taken every day.  Reliever or rescue medicines. These quickly relieve asthma symptoms. They are used as needed and provide short-term relief. Your health care provider will help you create an asthma action plan. An asthma action plan is a written plan for managing and treating your asthma attacks. It includes a list of your asthma triggers and how they may be avoided. It also includes information on when medicines should be taken and when their dosage should be changed. An action plan may also involve the use of a device called a peak flow meter. A peak flow meter measures how well the lungs  are working. It helps you monitor your condition. HOME CARE INSTRUCTIONS   Take medicine as directed by your health care provider. Speak with your health care provider if you have questions about how or when to take the medicines.  Use a peak flow meter as directed by your health care provider. Record and keep track of readings.  Understand and use the action plan to help minimize or stop an asthma attack without needing to seek medical care.  Control your home environment in the following ways to help prevent asthma attacks:  Do not smoke. Avoid being exposed to secondhand smoke.  Change your heating and air conditioning filter regularly.  Limit your use of  fireplaces and wood stoves.  Get rid of pests (such as roaches and mice) and their droppings.  Throw away plants if you see mold on them.  Clean your floors and dust regularly. Use unscented cleaning products.  Try to have someone else vacuum for you regularly. Stay out of rooms while they are being vacuumed and for a short while afterward. If you vacuum, use a dust mask from a hardware store, a double-layered or microfilter vacuum cleaner bag, or a vacuum cleaner with a HEPA filter.  Replace carpet with wood, tile, or vinyl flooring. Carpet can trap dander and dust.  Use allergy-proof pillows, mattress covers, and box spring covers.  Wash bed sheets and blankets every week in hot water and dry them in a dryer.  Use blankets that are made of polyester or cotton.  Clean bathrooms and kitchens with bleach. If possible, have someone repaint the walls in these rooms with mold-resistant paint. Keep out of the rooms that are being cleaned and painted.  Wash hands frequently. SEEK MEDICAL CARE IF:   You have wheezing, shortness of breath, or a cough even if taking medicine to prevent attacks.  The colored mucus you cough up (sputum) is thicker than usual.  Your sputum changes from clear or white to yellow, green, gray, or bloody.  You have any problems that may be related to the medicines you are taking (such as a rash, itching, swelling, or trouble breathing).  You are using a reliever medicine more than 2 3 times per week.  Your peak flow is still at 50 79% of you personal best after following your action plan for 1 hour. SEEK IMMEDIATE MEDICAL CARE IF:   You seem to be getting worse and are unresponsive to treatment during an asthma attack.  You are short of breath even at rest.  You get short of breath when doing very little physical activity.  You have difficulty eating, drinking, or talking due to asthma symptoms.  You develop chest pain.  You develop a fast  heartbeat.  You have a bluish color to your lips or fingernails.  You are lightheaded, dizzy, or faint.  Your peak flow is less than 50% of your personal best.  You have a fever or persistent symptoms for more than 2 3 days.  You have a fever and symptoms suddenly get worse. MAKE SURE YOU:   Understand these instructions.  Will watch your condition.  Will get help right away if you are not doing well or get worse. Document Released: 05/30/2005 Document Revised: 01/30/2013 Document Reviewed: 12/27/2012 [facility] Patient Information 2014 [place], [place].   Emergency Department Resource Guide 1) Find a Doctor and Pay Out of Pocket Although you won't have to find out who is covered by your insurance plan, it is a good idea  to ask around and get recommendations. You will then need to call the office and see if the doctor you have chosen will accept you as a new patient and what types of options they offer for patients who are self-pay. Some doctors offer discounts or will set up payment plans for their patients who do not have insurance, but you will need to ask so you aren't surprised when you get to your appointment.  2) Contact Your Local Health Department Not all health departments have doctors that can see patients for sick visits, but many do, so it is worth a call to see if yours does. If you don't know where your local health department is, you can check in your phone book. The CDC also has a tool to help you locate your state's health department, and many state websites also have listings of all of their local health departments.  3) Find a Walk-in Clinic If your illness is not likely to be very severe or complicated, you may want to try a walk in clinic. These are popping up all over the country in pharmacies, drugstores, and shopping centers. They're usually staffed by nurse practitioners or physician assistants that have been trained to treat common illnesses and complaints.  They're usually fairly quick and inexpensive. However, if you have serious medical issues or chronic medical problems, these are probably not your best option.  No Primary Care Doctor: - Call Health Connect at  [phone] - they can help you locate a primary care doctor that  accepts your insurance, provides certain services, etc. - Physician Referral Service- [phone]  Chronic Pain Problems: Organization         Address  Phone   Notes  [facility]  [phone] Patients need to be referred by their primary care doctor.   Medication Assistance: Organization         Address  Phone   Notes  [facility] [address] [place], [place] [address] [phone]) [phone] --Must be a resident of [place] -- Must have NO insurance coverage whatsoever (no Medicaid/ Medicare, etc.) -- The pt. MUST have a primary care doctor that directs their care regularly and follows them in the community   Med[facility]ist  [phone]   [employer]  [phone]    Agencies that provide inexpensive medical care: Organization         Address  Phone   Notes  [facility]  [phone]   [facility]    [phone]   [facility] [address] [place], [place] [address] [phone]   [facility] [address], [place] [phone]   Planned Parenthood    [phone]   [facility]    [phone]) [phone]   [facility]  [address], [place] Phone:  [phone], Fax:  [phone] Hours of Operation:  9 am - 6 pm, M-F.  Also accepts Medicaid/Medicare and self-pay.  [facility]  [address], [place] Phone: [phone](336) [phone], Fax: [phone]. Hours of Operation:  8:30 am - 5:30 pm, M-F.  Also accepts Medicaid and self-pay.  [facility] [place] [address], [place]  Phone: [phone]   [facility] [address], [place], [place] [phone], Ext. [phone] Mondays & Thursdays: 7-9 AM.  First 15 patients  are seen on a first come, first serve basis.    Medicaid-accepting [facility] Providers:  Organization         Address  Phone   Notes  [facility] [address], [place] [phone] Also accepts self-pay patients.  [facility] [address] Laurell Josephs [place], [place]  [phone]   [facility] [place] [address], [place] [phone]   [facility] [address] [place] [phone]   Renaye Rakers [address], [place]   [phone] Only accepts [place] Access [place] patients after they have their name applied to their card.   Self-Pay (no insurance) in [facility]:  Organization         Address  Phone   Notes  Sickle Cell Patients, [facility] [address] [place], [place] [phone]   [facility] [address] [place], [place] [phone]   [facility] [place]  [address], [place] [phone]   [facility]/Dr. Osei-Bonsu  [address] [place] or [address], [place] [phone] Phone number for both [place] and [place] locations is the same.  [facility] [address], [place] [phone]   [facility] [address], [place] or [address] [phone]   [facility] [address], [place] [phone], phone; [phone], fax Sees patients 1st and 3rd Saturday of every month.  Must not qualify for public or private insurance (i.e. Medicaid, Medicare, [place] Health Choice, Veterans' Benefits)  Household income should be no more than 200% of the poverty level The clinic cannot  treat you if you are pregnant or think you are pregnant  Sexually transmitted diseases are not treated at the clinic.    Dental Care: Organization         Address  Phone  Notes  [facility] [address] [place], [place] [phone] Accepts children up to age 86 who are enrolled in [place] or [place] Health Choice; pregnant women with a Medicaid card; and children who have applied for Medicaid or [place] Health Choice, but were declined, whose parents can pay a reduced fee at time of service.  [facility]  [address], [place] [phone] Accepts children up to age 43 who are enrolled in [place] or [place] Health Choice; pregnant women with a Medicaid card; and children who have applied for Medicaid or [place] Health Choice, but were declined, whose parents can pay a reduced fee at time of service.  [facility]  [address] [place], [place] [phone] Patients are seen by appointment only. Walk-ins are not accepted. [facility] will see patients 41 years of age and older. Monday - Tuesday (8am-5pm) Most Wednesdays (8:30-5pm) $30 per visit, cash only  [facility] Adult Dental Access PROGRAM  [address], [facility] [phone] Patients are seen by appointment only. Walk-ins are not accepted. [facility] will see patients 66 years of age and older. One Wednesday Evening (Monthly: Volunteer Based).  $30 per visit, cash only  [employer]  [phone] for adults; Children under age 31, call Graduate Pediatric Dentistry at [phone])  [phone]. Children aged 47-14, please call [phone] to request a pediatric application.  Dental services are provided in all areas of dental care including fillings, crowns and bridges, complete and partial dentures, implants, gum treatment, root canals, and extractions. Preventive care is also provided.  Treatment is provided to both adults and children. Patients are selected via a lottery and there is often a waiting list.   [facility] [address] [place]  [phone] [email or web address]   [facility] [address] [place], [place] [phone], Ext. [phone] Second and Fourth Thursday of each month, opens at 6:30 AM; Clinic ends at 9 AM.  Patients are seen on a first-come first-served basis, and a limited number are seen during each clinic.   [facility]  [address] Ether Griffins [place], [place] [phone]   Eligibility Requirements You must have lived in [place], [place], or [place] counties for at least the last three months.   You cannot be eligible for state or federal sponsored [place], including [employer], [place], or [employer].   You generally cannot be eligible for healthcare insurance through your employer.    How to apply: Eligibility screenings are held every Tuesday and Wednesday afternoon from 1:00 pm until 4:00 pm. You do not need an appointment for the interview!  [facility] [address] [place], [place] [phone]   [facility]  [phone]   [facility] Health Department  [phone]   [facility] Health Department  [phone]    Behavioral Health Resources in the Community: Intensive Outpatient Programs Organization         Address  Phone  Notes  [facility] [address], [place], [place] [phone]   [facility] Outpatient [address], [place], [place] [phone]   [facility] [address], [place], [place]  [phone]   [facility] Mental Health [address],  [place], [place] [phone] or [phone]   Substance Abuse Resources Organization         Address  Phone  Notes  [facility]  [phone]   [facility]   [phone]   [facility] [place]  [phone]   Floydene Flock  [phone]   Residential & Outpatient Substance Abuse Program  [phone]   Psychological Services Organization         Address  Phone  Notes  [facility] Health  [phone]   [facility]  [phone]   [facility] Mental Health [address] [place] [phone] or [phone]    Mobile Crisis Teams Organization         Address  Phone  Notes  [facility], Mobile Crisis Care Unit  [phone]   [facility]  [address] [place], [place] [phone]   Doristine Locks [address] [place] [phone]    Self-Help/Support Groups Organization         Address  Phone             Notes  [facility] [place] - variety of support groups  [phone]- [phone] Call for more information  Narcotics Anonymous (NA), [facility] [address], [employer] [place]  2 meetings at this location   Chief Executive Officer  Notes  [facility] [address],    [place]  [phone]   [facility]  [address], [place] [address], [place], [place] [phone]   [facility] [address] [place], [place] [phone] Admissions: 8am-3pm M-F  [facility] [address],    [place], [place] [phone]   [facility] [address] [place], [place], [place] [phone]   [facility] [address],  [place], [place] [phone]   [facility] - Intensive Outpatient [address]ure[address], [place], [place] [phone]   [facility] [address] [place]  [place], [place] [phone] or [phone]   [facility] ([facility]) [address] [place], [place] [phone] Accepts Medicaid  [facility] [place] [facility] [address] [place] [address]  [place] [phone] Substance  Abuse/Addiction Treatment   [facility] Organization         Address  Phone  Notes  [facility]  [phone]   Angie Fava, PhD [address] Ervin Knack [place], [place]   [phone] or [phone]   [facility] Behavioral   [address] [place], [place] [phone]   [facility] [address] [place], [place] [phone] Insurance/Medicaid/sponsorship through [facility] and Families [address]                                    [place], [place] [phone] Therapy/tele-psych/case  [facility] [address] [place], [place] [phone]    Dr. Lolly Mustache  [phone]   [facility] [place]  [employer] [facility] Dept. 1) [address], [place] 2) [address], [place] 3)  [address], [place] [phone]  [phone]   [facility] Child Abuse Hotline 361-495-3715 or [phone] (After Hours)

## 2013-09-07 NOTE — ED Notes (Signed)
MD at bedside. 

## 2013-09-07 NOTE — ED Notes (Signed)
Pt. Complaining stating "How long do I have to wait in this room before someone will come see me? I have been here for an hour and a half and not one person has been in here. I'm about to get up and leave. If I wanted to lay in pain I could do that at home. Is it because I have medicaid because that's what it seems like?" Pt. Updated on delay and plan of care. Assured patient we are working as best as we can to get everyone seen and that we care about his pain. PA made aware.

## 2013-09-10 ENCOUNTER — Other Ambulatory Visit: Payer: Self-pay | Admitting: Infectious Disease

## 2013-09-10 MED ORDER — OMEPRAZOLE 20 MG PO CPDR: 40.0000 mg | DELAYED_RELEASE_CAPSULE | Freq: Every day | ORAL | Status: DC

## 2013-09-14 NOTE — ED Provider Notes (Signed)
Medical screening examination/treatment/procedure(s) were performed by non-physician practitioner and as supervising physician I was immediately available for consultation/collaboration.   Varnika Butz L Zael Shuman, MD 09/14/13 0832 

## 2013-09-23 ENCOUNTER — Telehealth: Payer: Self-pay | Admitting: *Deleted

## 2013-09-23 ENCOUNTER — Other Ambulatory Visit: Payer: Self-pay | Admitting: Licensed Clinical Social Worker

## 2013-09-23 ENCOUNTER — Encounter: Payer: Self-pay | Admitting: Infectious Diseases

## 2013-09-23 ENCOUNTER — Ambulatory Visit (INDEPENDENT_AMBULATORY_CARE_PROVIDER_SITE_OTHER): Payer: Medicaid Other | Admitting: Infectious Diseases

## 2013-09-23 VITALS — BP 135/87 | HR 86 | Temp 98.6°F | Wt 265.0 lb

## 2013-09-23 DIAGNOSIS — B20 Human immunodeficiency virus [HIV] disease: Secondary | ICD-10-CM

## 2013-09-23 DIAGNOSIS — A691 Other Vincent's infections: Secondary | ICD-10-CM | POA: Insufficient documentation

## 2013-09-23 MED ORDER — MORPHINE SULFATE ER 30 MG PO TBCR: 30.0000 mg | EXTENDED_RELEASE_TABLET | Freq: Two times a day (BID) | ORAL | Status: DC

## 2013-09-23 MED ORDER — OXYCODONE-ACETAMINOPHEN 7.5-325 MG PO TABS: 1.0000 | ORAL_TABLET | Freq: Two times a day (BID) | ORAL | Status: DC | PRN

## 2013-09-23 MED ORDER — CHLORHEXIDINE GLUCONATE 0.12 % MT SOLN: 15.0000 mL | Freq: Two times a day (BID) | OROMUCOSAL | Status: DC

## 2013-09-23 MED ORDER — CLINDAMYCIN HCL 300 MG PO CAPS: 300.0000 mg | ORAL_CAPSULE | Freq: Four times a day (QID) | ORAL | Status: DC

## 2013-09-23 NOTE — Progress Notes (Signed)
   Subjective:    Patient ID: Marguerite OleaSteven A Pennie, male    DOB: Nov 10, 1970, 43 y.o.   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 Last CD4 1920, VL 21 (8.28.14), on Atripla. No problems with ART.  Has had gingival irritation for the last week. Has not been able to eat for last 3 days. Has had previously and saw dental, given peridex and anbx. Has not had f/u since (~ 1 year).    Review of Systems     Objective:   Physical Exam  Constitutional: He appears well-developed and well-nourished.  HENT:  Mouth/Throat:            Assessment & Plan:

## 2013-09-23 NOTE — Telephone Encounter (Signed)
Patient called to advise that she needs an antibiotic called in. She advised she has swollen gums and they are white for 4 days now. She has also had a fever for the past 2 days and is unable to eat. She has a Education officer, communitydentist and can not get in to see him until end of month. She advised she does not want to be seen just call a Rx into her pharmacy. I advised her she needs a visit so the doctor knows what antibiotic to prescribe

## 2013-09-23 NOTE — Assessment & Plan Note (Addendum)
Pt seen and discussed with dental. Will give peridex bid (no food or drink for 30 min after) and clinda for 10 days.  Will then return for dental cleaning.

## 2013-09-23 NOTE — Assessment & Plan Note (Signed)
Doing well with HIV meds. Has f/u with [facility].    HIV 1 RNA Quant (copies/mL)  Date Value  07/24/2013 24*  02/07/2013 21   08/16/2012 <20      HIV-1 RNA Viral Load (no units)  Date Value  01/25/2012 <40   11/22/2011 <40   07/20/2011 <40      CD4 T Cell Abs (/uL)  Date Value  07/24/2013 2840*  02/07/2013 1920   08/16/2012 2450

## 2013-09-25 ENCOUNTER — Other Ambulatory Visit: Payer: Self-pay | Admitting: Infectious Disease

## 2013-10-10 ENCOUNTER — Other Ambulatory Visit: Payer: Self-pay | Admitting: Infectious Disease

## 2013-10-24 ENCOUNTER — Other Ambulatory Visit: Payer: Self-pay | Admitting: Licensed Clinical Social Worker

## 2013-10-24 MED ORDER — OXYCODONE-ACETAMINOPHEN 7.5-325 MG PO TABS: 1.0000 | ORAL_TABLET | Freq: Two times a day (BID) | ORAL | Status: DC | PRN

## 2013-10-25 ENCOUNTER — Other Ambulatory Visit: Payer: Self-pay | Admitting: *Deleted

## 2013-10-25 MED ORDER — MORPHINE SULFATE ER 30 MG PO TBCR: 30.0000 mg | EXTENDED_RELEASE_TABLET | Freq: Two times a day (BID) | ORAL | Status: DC

## 2013-11-02 ENCOUNTER — Other Ambulatory Visit: Payer: Self-pay | Admitting: Infectious Disease

## 2013-11-05 ENCOUNTER — Other Ambulatory Visit: Payer: Self-pay | Admitting: Licensed Clinical Social Worker

## 2013-11-05 MED ORDER — ESCITALOPRAM OXALATE 20 MG PO TABS: 20.0000 mg | ORAL_TABLET | Freq: Every day | ORAL | Status: DC

## 2013-11-11 ENCOUNTER — Other Ambulatory Visit: Payer: Self-pay | Admitting: Licensed Clinical Social Worker

## 2013-11-11 ENCOUNTER — Telehealth: Payer: Self-pay | Admitting: Licensed Clinical Social Worker

## 2013-11-11 ENCOUNTER — Other Ambulatory Visit: Payer: Self-pay | Admitting: Infectious Disease

## 2013-11-11 MED ORDER — OXYCODONE-ACETAMINOPHEN 7.5-325 MG PO TABS: 1.0000 | ORAL_TABLET | Freq: Four times a day (QID) | ORAL | Status: DC

## 2013-11-11 NOTE — Telephone Encounter (Signed)
Patient called stating that he only received 60 pills on 5/15 instead of 120 because the instructions  read 2 times daily instead of 4 times daily. Medicaid would only cover 30 day supply. Per patient [facility] only gave him 60 and voided the other 60.   I verified with Boris Sharper pharmacist he said that is what happened and we could write a new script for the other 60 but to put 4 times a day and this should cover him until 6/15. His rx has been changed in the computer to 120 pills 4 times daily for future refills, ok per Dr. [name].

## 2013-11-25 ENCOUNTER — Other Ambulatory Visit: Payer: Self-pay | Admitting: *Deleted

## 2013-11-25 MED ORDER — MORPHINE SULFATE ER 30 MG PO TBCR: 30.0000 mg | EXTENDED_RELEASE_TABLET | Freq: Two times a day (BID) | ORAL | Status: DC

## 2013-11-25 MED ORDER — OXYCODONE-ACETAMINOPHEN 7.5-325 MG PO TABS: 1.0000 | ORAL_TABLET | Freq: Four times a day (QID) | ORAL | Status: DC

## 2013-12-09 ENCOUNTER — Other Ambulatory Visit: Payer: Self-pay | Admitting: Infectious Disease

## 2013-12-19 ENCOUNTER — Ambulatory Visit (INDEPENDENT_AMBULATORY_CARE_PROVIDER_SITE_OTHER): Payer: Self-pay | Admitting: *Deleted

## 2013-12-19 VITALS — BP 127/86 | HR 80 | Temp 98.8°F | Resp 16 | Wt 271.8 lb

## 2013-12-19 DIAGNOSIS — Z006 Encounter for examination for normal comparison and control in clinical research program: Secondary | ICD-10-CM

## 2013-12-19 DIAGNOSIS — B20 Human immunodeficiency virus [HIV] disease: Secondary | ICD-10-CM

## 2013-12-19 NOTE — Progress Notes (Signed)
[name] is here for A5322, week 72 observational study. We reviewed the new version of informed consent together. I fully explained the consent, risks, benefits, responsibilities, and answered questions. Participant verbalized understanding and signed the consent witnessed by me. I gave a copy of the signed consent to participant. She was saw Dr. Ninetta LightsHatcher in April for ANUG (acute necrotizing ulcerative gingivitis) and was prescribed an antibiotic and mouthwash which she has completed the course. She had an appointment with dental clinic but missed it. I encouraged her to call and reschedule and informed her about missing visits could place her at the back of the list to be seen. Questionnaires completed. Fasting labs drawn and she deferred obtaining a CD4 at this time. Vital signs are stable. She received $50 gift card for visit. Next appointment will be made in February where she will also need to be seen by Dr. Daiva EvesVan Dam for routine follow up. [name] HeapElisha Epperson RN  * She is possibly moving to [place] in August 2015 for job opportunities and better financial living. She wants to continue to be seen by our clinic and to continue in the study.

## 2013-12-25 ENCOUNTER — Telehealth: Payer: Self-pay | Admitting: *Deleted

## 2013-12-25 DIAGNOSIS — G894 Chronic pain syndrome: Secondary | ICD-10-CM

## 2013-12-25 MED ORDER — MORPHINE SULFATE ER 30 MG PO TBCR: 30.0000 mg | EXTENDED_RELEASE_TABLET | Freq: Two times a day (BID) | ORAL | Status: DC

## 2013-12-25 MED ORDER — OXYCODONE-ACETAMINOPHEN 7.5-325 MG PO TABS: 1.0000 | ORAL_TABLET | Freq: Four times a day (QID) | ORAL | Status: DC

## 2013-12-25 NOTE — Telephone Encounter (Signed)
Printed scripts given to Dr. Ninetta LightsHatcher for signature.

## 2014-01-15 ENCOUNTER — Encounter: Payer: Self-pay | Admitting: Infectious Disease

## 2014-01-15 LAB — HIV-1 RNA QUANT-NO REFLEX-BLD

## 2014-01-27 ENCOUNTER — Other Ambulatory Visit: Payer: Self-pay | Admitting: *Deleted

## 2014-01-27 ENCOUNTER — Other Ambulatory Visit: Payer: Self-pay | Admitting: Infectious Disease

## 2014-01-27 DIAGNOSIS — G894 Chronic pain syndrome: Secondary | ICD-10-CM

## 2014-01-27 MED ORDER — OXYCODONE-ACETAMINOPHEN 7.5-325 MG PO TABS: 1.0000 | ORAL_TABLET | Freq: Four times a day (QID) | ORAL | Status: DC

## 2014-01-27 MED ORDER — MORPHINE SULFATE ER 30 MG PO TBCR: 30.0000 mg | EXTENDED_RELEASE_TABLET | Freq: Two times a day (BID) | ORAL | Status: DC

## 2014-01-28 ENCOUNTER — Other Ambulatory Visit: Payer: Self-pay | Admitting: *Deleted

## 2014-01-28 MED ORDER — EFAVIRENZ-EMTRICITAB-TENOFOVIR 600-200-300 MG PO TABS: ORAL_TABLET | ORAL | Status: DC

## 2014-02-03 ENCOUNTER — Telehealth: Payer: Self-pay | Admitting: *Deleted

## 2014-02-03 NOTE — Telephone Encounter (Signed)
Patient called upset because she went to pick up her MsContin and was told it needed a prior authorization. She thought this had already been submitted. Told her I will forward to Starleen Arms, CMA who worked on this. Patient will call back tomorrow morning.

## 2014-02-04 ENCOUNTER — Telehealth: Payer: Self-pay | Admitting: Licensed Clinical Social Worker

## 2014-02-04 ENCOUNTER — Other Ambulatory Visit: Payer: Self-pay | Admitting: Licensed Clinical Social Worker

## 2014-02-04 DIAGNOSIS — G894 Chronic pain syndrome: Secondary | ICD-10-CM

## 2014-02-04 NOTE — Telephone Encounter (Signed)
I wll do it this one time but I dont like giving steroids that tobradex has.  It is tobradex 0.1% and sig is 2 drops in affected eye every 6 hours x 5 days

## 2014-02-04 NOTE — Telephone Encounter (Signed)
I faxed the signed authorization last week, I will call and check on the status.

## 2014-02-04 NOTE — Telephone Encounter (Signed)
Patient has drainage from his right eye with some redness and swelling would like a prescription for tobrex if possible. Patient lives in [place] now and can't come to clinic.

## 2014-02-05 ENCOUNTER — Other Ambulatory Visit: Payer: Self-pay | Admitting: Licensed Clinical Social Worker

## 2014-02-05 MED ORDER — TOBRAMYCIN-DEXAMETHASONE 0.3-0.1 % OP SUSP: 2.0000 [drp] | Freq: Four times a day (QID) | OPHTHALMIC | Status: DC

## 2014-02-10 ENCOUNTER — Telehealth: Payer: Self-pay | Admitting: *Deleted

## 2014-02-10 ENCOUNTER — Other Ambulatory Visit: Payer: Self-pay | Admitting: Infectious Disease

## 2014-02-10 DIAGNOSIS — R7309 Other abnormal glucose: Secondary | ICD-10-CM

## 2014-02-10 DIAGNOSIS — E119 Type 2 diabetes mellitus without complications: Secondary | ICD-10-CM

## 2014-02-10 MED ORDER — METFORMIN HCL 500 MG PO TABS: 500.0000 mg | ORAL_TABLET | Freq: Every day | ORAL | Status: DC

## 2014-02-10 NOTE — Telephone Encounter (Signed)
#  60 with 4 refills, Take one tablet BID PRN. She has existing script in the computer as a chronic med

## 2014-02-10 NOTE — Telephone Encounter (Signed)
Alprazolam rx for refill.  MD, please give refill information, # of tablets and # of refills.

## 2014-02-11 ENCOUNTER — Other Ambulatory Visit: Payer: Self-pay | Admitting: Infectious Disease

## 2014-02-11 ENCOUNTER — Other Ambulatory Visit: Payer: Self-pay | Admitting: *Deleted

## 2014-02-11 MED ORDER — ALPRAZOLAM 2 MG PO TABS: ORAL_TABLET | ORAL | Status: DC

## 2014-02-11 NOTE — Telephone Encounter (Signed)
Called in rx [place], Zachary George, RN

## 2014-02-12 ENCOUNTER — Encounter: Payer: Self-pay | Admitting: Infectious Disease

## 2014-02-24 ENCOUNTER — Other Ambulatory Visit: Payer: Self-pay | Admitting: *Deleted

## 2014-02-24 DIAGNOSIS — G894 Chronic pain syndrome: Secondary | ICD-10-CM

## 2014-02-24 MED ORDER — OXYCODONE-ACETAMINOPHEN 7.5-325 MG PO TABS: 1.0000 | ORAL_TABLET | Freq: Four times a day (QID) | ORAL | Status: DC

## 2014-02-24 MED ORDER — MORPHINE SULFATE ER 30 MG PO TBCR: 30.0000 mg | EXTENDED_RELEASE_TABLET | Freq: Two times a day (BID) | ORAL | Status: DC

## 2014-03-17 ENCOUNTER — Telehealth: Payer: Self-pay | Admitting: *Deleted

## 2014-03-17 NOTE — Telephone Encounter (Signed)
Per Rob, [facility]ist at [facility], patient's MS Contin [facility] brand name form only - generic is not approved. Andree CossHowell, Sher Shampine M, RN

## 2014-03-26 ENCOUNTER — Other Ambulatory Visit: Payer: Self-pay | Admitting: Licensed Clinical Social Worker

## 2014-03-26 ENCOUNTER — Telehealth: Payer: Self-pay

## 2014-03-26 DIAGNOSIS — G894 Chronic pain syndrome: Secondary | ICD-10-CM

## 2014-03-26 MED ORDER — MORPHINE SULFATE ER 30 MG PO TBCR: 30.0000 mg | EXTENDED_RELEASE_TABLET | Freq: Two times a day (BID) | ORAL | Status: DC

## 2014-03-26 MED ORDER — OXYCODONE-ACETAMINOPHEN 7.5-325 MG PO TABS: 1.0000 | ORAL_TABLET | Freq: Four times a day (QID) | ORAL | Status: DC

## 2014-03-26 NOTE — Telephone Encounter (Signed)
Patient is calling for refill of narcotic medication.  Dr Daiva EvesVan Dam has agreed to fill her MS Contin and Percocet but plans to adjust these medications by decreasing Percoset dosage.   Scripts signed .  Laurell Josephsammy K Dametri Ozburn, RN

## 2014-03-28 ENCOUNTER — Other Ambulatory Visit: Payer: Self-pay

## 2014-04-11 ENCOUNTER — Other Ambulatory Visit: Payer: Self-pay | Admitting: Infectious Disease

## 2014-04-28 ENCOUNTER — Other Ambulatory Visit: Payer: Self-pay | Admitting: Licensed Clinical Social Worker

## 2014-04-28 DIAGNOSIS — G894 Chronic pain syndrome: Secondary | ICD-10-CM

## 2014-04-28 MED ORDER — OXYCODONE-ACETAMINOPHEN 7.5-325 MG PO TABS: 1.0000 | ORAL_TABLET | Freq: Four times a day (QID) | ORAL | Status: DC

## 2014-04-28 MED ORDER — MORPHINE SULFATE ER 30 MG PO TBCR: 30.0000 mg | EXTENDED_RELEASE_TABLET | Freq: Two times a day (BID) | ORAL | Status: DC

## 2014-05-26 ENCOUNTER — Other Ambulatory Visit: Payer: Self-pay | Admitting: Licensed Clinical Social Worker

## 2014-05-26 DIAGNOSIS — G894 Chronic pain syndrome: Secondary | ICD-10-CM

## 2014-05-26 MED ORDER — OXYCODONE-ACETAMINOPHEN 7.5-325 MG PO TABS: 1.0000 | ORAL_TABLET | Freq: Four times a day (QID) | ORAL | Status: DC

## 2014-05-26 MED ORDER — MORPHINE SULFATE ER 30 MG PO TBCR: 30.0000 mg | EXTENDED_RELEASE_TABLET | Freq: Two times a day (BID) | ORAL | Status: DC

## 2014-05-28 ENCOUNTER — Telehealth: Payer: Self-pay | Admitting: *Deleted

## 2014-05-28 NOTE — Telephone Encounter (Signed)
1)Patient requesting referral to [facility] [phone].  Patient states she was told that this would be covered by insurance if it was deemed medically necessary.  Patient was given appointment for this review on 07/01/14. 2) patient picking up her pain medication.  Per phone note 03/26/14, her medication is to be tapered.  Patient has not seen Dr. Daiva EvesVan Dam for an office visit in 15 months -- since 02/27/2013.  Patient has been seen regularly for research and has had PRN visits with other physicians.    Please advise if her appointment 07/01/14 needs to be moved up.  She states she will be back 1/15 to pick up her next refill of pain medication.  Andree CossHowell, Dewaun Kinzler M, RN

## 2014-05-30 NOTE — Telephone Encounter (Signed)
Yes we do need to wean the Oxycodone to off it is way too much for someone who also gets MS contin and frankly I doubt there is strong enough need for all of these narcotics. Let her have this refill and will address at office visit in January

## 2014-06-26 ENCOUNTER — Other Ambulatory Visit: Payer: Self-pay | Admitting: Licensed Clinical Social Worker

## 2014-06-26 DIAGNOSIS — G894 Chronic pain syndrome: Secondary | ICD-10-CM

## 2014-06-26 MED ORDER — OXYCODONE-ACETAMINOPHEN 7.5-325 MG PO TABS: 1.0000 | ORAL_TABLET | Freq: Four times a day (QID) | ORAL | Status: DC

## 2014-06-26 MED ORDER — MORPHINE SULFATE ER 30 MG PO TBCR: 30.0000 mg | EXTENDED_RELEASE_TABLET | Freq: Two times a day (BID) | ORAL | Status: DC

## 2014-06-30 ENCOUNTER — Telehealth: Payer: Self-pay | Admitting: *Deleted

## 2014-06-30 NOTE — Telephone Encounter (Signed)
Next time we will only dispense half the amount of Percocet that [name] has been getting #60

## 2014-06-30 NOTE — Telephone Encounter (Signed)
Patient cancelled January follow up.  Has not been seen by Dr. Daiva EvesVan Dam since 2013 (has been seeing other providers).  Patient must be seen by Dr. Daiva EvesVan Dam for continuation of her pain medications. Andree CossHowell, Michelle M, RN

## 2014-07-01 ENCOUNTER — Ambulatory Visit: Payer: Medicaid Other | Admitting: Infectious Disease

## 2014-07-03 ENCOUNTER — Telehealth: Payer: Self-pay | Admitting: *Deleted

## 2014-07-03 NOTE — Telephone Encounter (Signed)
Per Dr. Daiva EvesVan Dam, patient must be seen by him.  He is changing how much he will prescribe, but only Dr. Daiva EvesVan Dam will discuss this with the patient - in person - and ONLY at an appointment.  If the patient is not seen before her next refill, he will give her only #60 percocet for the next refill (so as to encourage her to see him).

## 2014-07-14 ENCOUNTER — Other Ambulatory Visit: Payer: Self-pay | Admitting: Infectious Disease

## 2014-07-29 ENCOUNTER — Telehealth: Payer: Self-pay | Admitting: *Deleted

## 2014-07-29 NOTE — Telephone Encounter (Signed)
Pt called.  Stated that he was not informed/called about the change in his pain medications and needing an appointment with Dr. Daiva EvesVan Dam to discuss his pain medications.  He called today anticipating being able to refill both morphine and percocet rxes.  He is now living in [place] and he does not drive.  He had arranged for a driver to come today to pick up his rxes.  Pt is unable to come back on Thursday, 07/31/14 to see Dr. Daiva EvesVan Dam.  He was given an appt for March 16 @ 1:45 PM to see Dr. Daiva EvesVan Dam.   Pt requesting that his pain medication refills  not be changed until he has his appointment with Dr. Daiva EvesVan Dam due to not being notified that there was going to be a change in his pain medications.  Pt believes that he would go through narcotic withdrawal if his current medications were stopped abruptly.  Dr. Daiva EvesVan Dam please advise.  Pt is expecting a call back today.

## 2014-08-11 ENCOUNTER — Telehealth: Payer: Self-pay | Admitting: Licensed Clinical Social Worker

## 2014-08-11 ENCOUNTER — Other Ambulatory Visit: Payer: Self-pay | Admitting: Licensed Clinical Social Worker

## 2014-08-11 ENCOUNTER — Other Ambulatory Visit: Payer: Medicaid Other

## 2014-08-11 ENCOUNTER — Other Ambulatory Visit: Payer: Self-pay | Admitting: *Deleted

## 2014-08-11 DIAGNOSIS — Z79899 Other long term (current) drug therapy: Secondary | ICD-10-CM

## 2014-08-11 DIAGNOSIS — G894 Chronic pain syndrome: Secondary | ICD-10-CM

## 2014-08-11 MED ORDER — MORPHINE SULFATE ER 30 MG PO TBCR: 30.0000 mg | EXTENDED_RELEASE_TABLET | Freq: Two times a day (BID) | ORAL | Status: DC

## 2014-08-11 MED ORDER — OXYCODONE-ACETAMINOPHEN 7.5-325 MG PO TABS: 1.0000 | ORAL_TABLET | Freq: Two times a day (BID) | ORAL | Status: DC

## 2014-08-11 NOTE — Telephone Encounter (Signed)
Patient called inquiring about pain medication, and if he could pick something up. Per Dr. Daiva EvesVan Dam he is willing to cut her dosage in half, and he will discuss with the patient at her visit on 3/16. Patient is aware that the dosage is cut in half, rx printed. Patient was asked to give urine test today when she comes to pick up her script.

## 2014-08-21 ENCOUNTER — Other Ambulatory Visit: Payer: Self-pay | Admitting: *Deleted

## 2014-08-21 DIAGNOSIS — Z113 Encounter for screening for infections with a predominantly sexual mode of transmission: Secondary | ICD-10-CM

## 2014-08-27 ENCOUNTER — Ambulatory Visit: Payer: Medicaid Other | Admitting: Infectious Disease

## 2014-09-02 ENCOUNTER — Ambulatory Visit (INDEPENDENT_AMBULATORY_CARE_PROVIDER_SITE_OTHER): Payer: Self-pay | Admitting: *Deleted

## 2014-09-02 ENCOUNTER — Other Ambulatory Visit: Payer: Self-pay

## 2014-09-02 ENCOUNTER — Other Ambulatory Visit: Payer: Self-pay | Admitting: Infectious Disease

## 2014-09-02 VITALS — BP 142/93 | HR 74 | Temp 98.0°F | Resp 16 | Ht 71.0 in | Wt 283.0 lb

## 2014-09-02 DIAGNOSIS — Z79891 Long term (current) use of opiate analgesic: Secondary | ICD-10-CM

## 2014-09-02 DIAGNOSIS — Z79899 Other long term (current) drug therapy: Secondary | ICD-10-CM

## 2014-09-02 DIAGNOSIS — Z113 Encounter for screening for infections with a predominantly sexual mode of transmission: Secondary | ICD-10-CM

## 2014-09-02 DIAGNOSIS — Z006 Encounter for examination for normal comparison and control in clinical research program: Secondary | ICD-10-CM

## 2014-09-02 LAB — COMPREHENSIVE METABOLIC PANEL
ALT: 55 U/L — AB (ref 0–53)
AST: 35 U/L (ref 0–37)
Albumin: 4.4 g/dL (ref 3.5–5.2)
Alkaline Phosphatase: 104 U/L (ref 39–117)
BUN: 11 mg/dL (ref 6–23)
CALCIUM: 8.8 mg/dL (ref 8.4–10.5)
CO2: 29 mEq/L (ref 19–32)
CREATININE: 0.74 mg/dL (ref 0.50–1.35)
Chloride: 104 mEq/L (ref 96–112)
GLUCOSE: 120 mg/dL — AB (ref 70–99)
Potassium: 4.3 mEq/L (ref 3.5–5.3)
Sodium: 140 mEq/L (ref 135–145)
TOTAL PROTEIN: 6.8 g/dL (ref 6.0–8.3)
Total Bilirubin: 0.3 mg/dL (ref 0.2–1.2)

## 2014-09-02 LAB — LIPID PANEL
Cholesterol: 154 mg/dL (ref 0–200)
HDL: 32 mg/dL — ABNORMAL LOW (ref >=40)
LDL Cholesterol: 93 mg/dL (ref 0–99)
TRIGLYCERIDES: 144 mg/dL (ref <150)
Total CHOL/HDL Ratio: 4.8 Ratio
VLDL: 29 mg/dL (ref 0–40)

## 2014-09-02 LAB — HEMOGLOBIN A1C
HEMOGLOBIN A1C: 6.8 % — AB (ref <5.7)
Mean Plasma Glucose: 148 mg/dL — ABNORMAL HIGH (ref <117)

## 2014-09-02 LAB — HEPATITIS C ANTIBODY: HCV AB: NEGATIVE

## 2014-09-02 LAB — HEPATITIS B SURFACE ANTIGEN: HEP B S AG: NEGATIVE

## 2014-09-02 LAB — HEPATITIS B SURFACE ANTIBODY,QUALITATIVE: HEP B S AB: NEGATIVE

## 2014-09-02 NOTE — Progress Notes (Signed)
[name] is [facility]. She denies any new problems but does say she has a lesion on her left buttock that keeps coming back after it drains. She is to see Dr. Daiva EvesVan Dam next week and we went ahead and got the urine drug screen at this visit in preparation for the visit. She does say that she has been feeling more unsteady on her feet like she may lose her balance but hasn't actually fallen. She will return in July for the next study visit.

## 2014-09-02 NOTE — Addendum Note (Signed)
Addended by: Mariea ClontsGREEN, Wendie Diskin D on: 09/02/2014 12:09 PM   Modules accepted: Orders

## 2014-09-03 LAB — DRUG SCREEN, URINE
Amphetamine Screen, Ur: NEGATIVE
BENZODIAZEPINES.: NEGATIVE
Barbiturate Quant, Ur: NEGATIVE
Cocaine Metabolites: NEGATIVE
Creatinine,U: 214.11 mg/dL
METHADONE: NEGATIVE
Marijuana Metabolite: POSITIVE — AB
Opiates: POSITIVE — AB
PROPOXYPHENE: NEGATIVE
Phencyclidine (PCP): NEGATIVE

## 2014-09-03 LAB — PROTEIN, URINE, RANDOM: TOTAL PROTEIN, URINE: 36 mg/dL — AB (ref 5–25)

## 2014-09-03 LAB — CREATININE, URINE, RANDOM: CREATININE, URINE: 207.4 mg/dL

## 2014-09-03 LAB — RPR

## 2014-09-07 LAB — OPIATES/OPIOIDS (LC/MS-MS)
Codeine Urine: NEGATIVE ng/mL (ref <50)
HYDROMORPHONE: NEGATIVE ng/mL (ref <50)
Hydrocodone: NEGATIVE ng/mL (ref <50)
MORPHINE: 35321 ng/mL — AB (ref <50)
NOROXYCODONE, UR: NEGATIVE ng/mL (ref <50)
Norhydrocodone, Ur: NEGATIVE ng/mL (ref <50)
OXYCODONE, UR: NEGATIVE ng/mL (ref <50)
Oxymorphone: NEGATIVE ng/mL (ref <50)

## 2014-09-08 ENCOUNTER — Other Ambulatory Visit: Payer: Self-pay | Admitting: Infectious Disease

## 2014-09-09 ENCOUNTER — Telehealth: Payer: Self-pay | Admitting: Licensed Clinical Social Worker

## 2014-09-09 NOTE — Telephone Encounter (Signed)
-----   Message from Randall Hissornelius N Van Dam, MD sent at 09/08/2014  6:19 PM EDT ----- [name] has no oxycodone in her system nor does she have any of its metabolites. I have discontinued the oxycodone from her MAR. She does have morphine in the system based on the urine test. I am for now willing to continue the morphine but do not feel that she needs further oxycodone prescriptions. I am suspicious for diversion

## 2014-09-09 NOTE — Telephone Encounter (Signed)
Patient has an appointment coming up in April and this can be discussed at the appointment. For now patient can't get Oxycodone

## 2014-09-10 ENCOUNTER — Ambulatory Visit: Payer: Medicaid Other | Admitting: Infectious Disease

## 2014-09-17 ENCOUNTER — Other Ambulatory Visit: Payer: Self-pay | Admitting: Licensed Clinical Social Worker

## 2014-09-17 ENCOUNTER — Encounter: Payer: Self-pay | Admitting: Infectious Disease

## 2014-09-17 ENCOUNTER — Telehealth: Payer: Self-pay | Admitting: Licensed Clinical Social Worker

## 2014-09-17 LAB — HIV-1 RNA QUANT-NO REFLEX-BLD: HIV-1 RNA Viral Load: <40

## 2014-09-17 LAB — CD4/CD8 (T-HELPER/T-SUPPRESSOR CELL)
CD4%: 50.4
CD4: 2369
CD8 % Suppressor T Cell: 28.1
CD8: 1321

## 2014-09-17 MED ORDER — MORPHINE SULFATE ER 15 MG PO TBCR: 15.0000 mg | EXTENDED_RELEASE_TABLET | Freq: Two times a day (BID) | ORAL | Status: DC

## 2014-09-17 NOTE — Telephone Encounter (Signed)
You're welcome!

## 2014-09-17 NOTE — Telephone Encounter (Signed)
I called the patient to let him know that he will no longer be able to receive Percocet, Xanax, or MS Contin from us per Dr. Daiva EvesVan Dam. Patient's drug screen did not show any of the drugs in his system except MS Contin.  His last prescription is for MS Contin 15 mg # 60 to be picked up tomorrow and after that he will not get anything else. Patient verbalized understanding.

## 2014-09-17 NOTE — Telephone Encounter (Signed)
Thank you Tamika and thanks for being diligent in looking through his drug screens, very helpful!

## 2014-09-30 ENCOUNTER — Ambulatory Visit: Payer: Medicaid Other | Admitting: Infectious Disease

## 2014-12-05 ENCOUNTER — Other Ambulatory Visit: Payer: Self-pay | Admitting: Infectious Disease

## 2014-12-08 ENCOUNTER — Other Ambulatory Visit: Payer: Self-pay

## 2014-12-13 ENCOUNTER — Other Ambulatory Visit: Payer: Self-pay | Admitting: Infectious Disease

## 2015-01-31 ENCOUNTER — Other Ambulatory Visit: Payer: Self-pay | Admitting: Infectious Disease

## 2015-03-27 ENCOUNTER — Other Ambulatory Visit: Payer: Self-pay | Admitting: Infectious Disease

## 2015-03-30 NOTE — Telephone Encounter (Signed)
Is it okay to refill these medications for patient as we have not seen him since 09/2013, please advise.

## 2015-03-30 NOTE — Telephone Encounter (Signed)
I am happy to refill [name]'s antivirals, but she DOES need to come in for blood work at least once a year .  Would refill the other MEDS if she can come in for blood work ON the meds and makes an appt with a provider

## 2015-04-22 ENCOUNTER — Other Ambulatory Visit: Payer: Self-pay | Admitting: Infectious Disease

## 2015-04-23 ENCOUNTER — Telehealth: Payer: Self-pay | Admitting: *Deleted

## 2015-04-23 NOTE — Telephone Encounter (Signed)
Patient requesting refills of her medications.  RN contacted patient to see if she was in care elsewhere, as she has a [place] address, her last office visit with Dr. Daiva EvesVan Dam was 09/2013, she had missed recent research appointments, and we had no contact from her since her controlled substances were stopped.  Patient was very upset, stating she had been without medication for 3 weeks because her refill was denied last month.  RN was able to let patient express her anger, calmed her down, explained and apologized for the gap.  RN refilled medication for patient to pick up locally in [place].  Patient will try to get transportation to come for a visit with Dr Daiva EvesVan Dam and [facility] research on Monday 11/14.  She is planning to move to [place] and would like to tie things up here before she moves (likely this December or January).  She will sign a release when she is in the office.  She will call triage tomorrow to confirm/cancel her appointment. Patient has had a 3 week gap in her atripla, but will resume today 11/10. Andree CossHowell, Uchechukwu Dhawan M, RN

## 2015-04-24 NOTE — Telephone Encounter (Signed)
I really dont like it when pts miss meds due to our not prescribing meds when they miss appts (which Id dont know if that happened here or not--sounds ike it is a misunderstanding. For patients who take their meds reliably and have consitent undetectable viral loads I would be willing to let them go for even 2 years without a visit and continue to renew meds but that is my personal feeling. Thanks for your help here Marcelino DusterMichelle!

## 2015-04-27 ENCOUNTER — Ambulatory Visit (INDEPENDENT_AMBULATORY_CARE_PROVIDER_SITE_OTHER): Payer: Medicaid Other | Admitting: Infectious Disease

## 2015-04-27 ENCOUNTER — Encounter: Payer: Self-pay | Admitting: Infectious Disease

## 2015-04-27 ENCOUNTER — Telehealth: Payer: Self-pay | Admitting: Infectious Disease

## 2015-04-27 ENCOUNTER — Encounter (INDEPENDENT_AMBULATORY_CARE_PROVIDER_SITE_OTHER): Payer: Medicaid Other | Admitting: *Deleted

## 2015-04-27 VITALS — BP 153/105 | HR 90 | Temp 98.0°F | Ht 72.0 in | Wt 271.2 lb

## 2015-04-27 VITALS — BP 165/94 | HR 94 | Temp 97.7°F | Resp 18 | Ht 72.0 in | Wt 270.5 lb

## 2015-04-27 DIAGNOSIS — R358 Other polyuria: Secondary | ICD-10-CM

## 2015-04-27 DIAGNOSIS — R3589 Other polyuria: Secondary | ICD-10-CM | POA: Insufficient documentation

## 2015-04-27 DIAGNOSIS — F640 Transsexualism: Secondary | ICD-10-CM | POA: Diagnosis not present

## 2015-04-27 DIAGNOSIS — Z006 Encounter for examination for normal comparison and control in clinical research program: Secondary | ICD-10-CM

## 2015-04-27 DIAGNOSIS — I10 Essential (primary) hypertension: Secondary | ICD-10-CM | POA: Diagnosis not present

## 2015-04-27 DIAGNOSIS — E1165 Type 2 diabetes mellitus with hyperglycemia: Secondary | ICD-10-CM | POA: Insufficient documentation

## 2015-04-27 DIAGNOSIS — N390 Urinary tract infection, site not specified: Secondary | ICD-10-CM | POA: Diagnosis not present

## 2015-04-27 DIAGNOSIS — Z789 Other specified health status: Secondary | ICD-10-CM

## 2015-04-27 DIAGNOSIS — B20 Human immunodeficiency virus [HIV] disease: Secondary | ICD-10-CM | POA: Diagnosis not present

## 2015-04-27 HISTORY — DX: Other polyuria: R35.89

## 2015-04-27 HISTORY — DX: Other specified health status: Z78.9

## 2015-04-27 HISTORY — DX: Type 2 diabetes mellitus with hyperglycemia: E11.65

## 2015-04-27 LAB — COMPREHENSIVE METABOLIC PANEL
ALT: 109 U/L — ABNORMAL HIGH (ref 9–46)
AST: 43 U/L — ABNORMAL HIGH (ref 10–40)
Albumin: 4.2 g/dL (ref 3.6–5.1)
Alkaline Phosphatase: 127 U/L — ABNORMAL HIGH (ref 40–115)
BUN: 12 mg/dL (ref 7–25)
CHLORIDE: 95 mmol/L — AB (ref 98–110)
CO2: 26 mmol/L (ref 20–31)
CREATININE: 0.79 mg/dL (ref 0.60–1.35)
Calcium: 9.9 mg/dL (ref 8.6–10.3)
Glucose, Bld: 514 mg/dL (ref 65–99)
Potassium: 4.4 mmol/L (ref 3.5–5.3)
SODIUM: 135 mmol/L (ref 135–146)
TOTAL PROTEIN: 7.4 g/dL (ref 6.1–8.1)
Total Bilirubin: 0.4 mg/dL (ref 0.2–1.2)

## 2015-04-27 LAB — LIPID PANEL
CHOL/HDL RATIO: 6.6 ratio — AB (ref <=5.0)
CHOLESTEROL: 204 mg/dL — AB (ref 125–200)
HDL: 31 mg/dL — ABNORMAL LOW (ref >=40)
Triglycerides: 475 mg/dL — ABNORMAL HIGH (ref <150)

## 2015-04-27 LAB — CD4/CD8 (T-HELPER/T-SUPPRESSOR CELL)
CD4%: 52.7
CD4: 2213
CD8 % Suppressor T Cell: 29.7
CD8: 1247

## 2015-04-27 LAB — HIV-1 RNA QUANT-NO REFLEX-BLD: HIV 1 RNA VIRAL LOAD: 58

## 2015-04-27 LAB — HEMOGLOBIN A1C
HEMOGLOBIN A1C: 11.5 % — AB (ref <5.7)
MEAN PLASMA GLUCOSE: 283 mg/dL — AB (ref <117)

## 2015-04-27 MED ORDER — ALBUTEROL SULFATE HFA 108 (90 BASE) MCG/ACT IN AERS: 2.0000 | INHALATION_SPRAY | Freq: Four times a day (QID) | RESPIRATORY_TRACT | Status: DC | PRN

## 2015-04-27 MED ORDER — EFAVIRENZ-EMTRICITAB-TENOFOVIR 600-200-300 MG PO TABS: 1.0000 | ORAL_TABLET | Freq: Every day | ORAL | Status: DC

## 2015-04-27 MED ORDER — ESCITALOPRAM OXALATE 20 MG PO TABS: 20.0000 mg | ORAL_TABLET | Freq: Every day | ORAL | Status: DC

## 2015-04-27 MED ORDER — METFORMIN HCL 500 MG PO TABS: 500.0000 mg | ORAL_TABLET | Freq: Every day | ORAL | Status: DC

## 2015-04-27 MED ORDER — SPIRONOLACTONE 50 MG PO TABS: 50.0000 mg | ORAL_TABLET | Freq: Every day | ORAL | Status: DC

## 2015-04-27 NOTE — Telephone Encounter (Signed)
[name]'s BG came back at greater than 500 but BMP does not show acidosis.  She will need insulin to control her blood sugars given how high they are now and that her A1c is now at 11  This is not controllable with metformin  She needs to get in with PCP to manage her DM  To manage her high BG today she would need to go to an ER  I tried to leave message on her phone at 11pm but there is no VM capacity  We should call her again in the am and urge to go to ED for glucose stablization and also [facility] [place]

## 2015-04-27 NOTE — Progress Notes (Signed)
Chief complaint:" I have been out of my meds for 3 weeks, and I am peeing every 45 minutes"  Subjective:    Patient ID: [name] OleaSteven A [name], male    DOB: Jan 28, 1971, 44 y.o.   MRN: [id number]  HPI  44 year old transgender male, [name] who has typically been highly compliant with ARVs (Atripla) but who has not been seen by me in well over a year (ever since I stopped rx controlled substances for her pain and anxiety (see prior notes)  She is now back on Atripla with refills x 2 months written. I have told her I will even give her a years worth of Atripla rx provided she is highly adherent and she will need regardless to establish care with the clinic in [place] where she is moving in 2 weeks time.   She has also been off of her DM meds, anti-HTNsives.   Past Medical History  Diagnosis Date  . Diabetes mellitus   . Hypertension   . HIV disease (HCC)   . MRSA carrier   . Polyuria 04/27/2015    Past Surgical History  Procedure Laterality Date  . None      No family history on file.    Social History   Social History  . Marital Status: Single    Spouse Name: N/A  . Number of Children: N/A  . Years of Education: N/A   Social History Main Topics  . Smoking status: Current Every Day Smoker -- 1.00 packs/day for 27 years    Types: Cigarettes  . Smokeless tobacco: Never Used     Comment: nicotene patch  . Alcohol Use: No  . Drug Use: Yes    Special: Marijuana  . Sexual Activity:    Partners: Male     Comment: pt. given condoms   Other Topics Concern  . None   Social History Narrative    Allergies  Allergen Reactions  . Propoxyphene N-Acetaminophen Other (See Comments)    Stomach cramps     Current outpatient prescriptions:  .  albuterol (PROVENTIL HFA;VENTOLIN HFA) 108 (90 BASE) MCG/ACT inhaler, Inhale 2 puffs into the lungs every 6 (six) hours as needed for wheezing or shortness of breath., Disp: 1 Inhaler, Rfl: 6 .  alprazolam (XANAX) 2 MG tablet,  TAKE 1 TABLET BY MOUTH TWICE DAILY AS NEEDED, Disp: 60 tablet, Rfl: 1 .  chlorhexidine (PERIDEX) 0.12 % solution, Use as directed 15 mLs in the mouth or throat 2 (two) times daily., Disp: 120 mL, Rfl: 0 .  clindamycin (CLEOCIN) 300 MG capsule, Take 1 capsule (300 mg total) by mouth 4 (four) times daily., Disp: 40 capsule, Rfl: 0 .  clotrimazole-betamethasone (LOTRISONE) cream, Apply 1 application topically daily as needed (wound care/blisters on feet)., Disp: , Rfl:  .  efavirenz-emtricitabine-tenofovir (ATRIPLA) 600-200-300 MG tablet, Take 1 tablet by mouth at bedtime., Disp: 30 tablet, Rfl: 11 .  escitalopram (LEXAPRO) 20 MG tablet, Take 1 tablet (20 mg total) by mouth at bedtime., Disp: 30 tablet, Rfl: 11 .  Fluticasone-Salmeterol (ADVAIR) 100-50 MCG/DOSE AEPB, Inhale 1 puff into the lungs daily., Disp: , Rfl:  .  metFORMIN (GLUCOPHAGE) 500 MG tablet, Take 1 tablet (500 mg total) by mouth at bedtime., Disp: 30 tablet, Rfl: 11 .  morphine (MS CONTIN) 15 MG 12 hr tablet, Take 1 tablet (15 mg total) by mouth every 12 (twelve) hours., Disp: 60 tablet, Rfl: 0 .  omeprazole (PRILOSEC) 20 MG capsule, TAKE 2 CAPSULES BY MOUTH EVERY NIGHT  AT BEDTIME, Disp: 60 capsule, Rfl: 2 .  spironolactone (ALDACTONE) 50 MG tablet, TAKE 1 TABLET BY MOUTH EVERY DAY, Disp: 30 tablet, Rfl: 0 .  spironolactone (ALDACTONE) 50 MG tablet, Take 1 tablet (50 mg total) by mouth daily., Disp: 30 tablet, Rfl: 11 .  tobramycin-dexamethasone (TOBRADEX) ophthalmic solution, Place 2 drops into the right eye every 6 (six) hours., Disp: 5 mL, Rfl: 0   Review of Systems  Constitutional: Negative for fever, chills, diaphoresis, activity change, appetite change, fatigue and unexpected weight change.  HENT: Negative for congestion, rhinorrhea, sinus pressure, sneezing, sore throat and trouble swallowing.   Eyes: Negative for photophobia and visual disturbance.  Respiratory: Negative for cough, chest tightness, shortness of breath,  wheezing and stridor.   Cardiovascular: Negative for chest pain, palpitations and leg swelling.  Gastrointestinal: Negative for nausea, vomiting, abdominal pain, diarrhea, constipation, blood in stool, abdominal distention and anal bleeding.  Endocrine: Positive for polydipsia and polyuria.  Genitourinary: Positive for frequency. Negative for dysuria, hematuria, flank pain and difficulty urinating.  Musculoskeletal: Negative for myalgias, back pain, joint swelling, arthralgias and gait problem.  Skin: Negative for color change, pallor, rash and wound.  Neurological: Negative for dizziness, tremors, weakness and light-headedness.  Hematological: Negative for adenopathy. Does not bruise/bleed easily.  Psychiatric/Behavioral: Positive for dysphoric mood. Negative for behavioral problems, confusion, sleep disturbance, decreased concentration and agitation.       Objective:   Physical Exam  Constitutional: He is oriented to person, place, and time. He appears well-developed and well-nourished.  HENT:  Head: Normocephalic and atraumatic.  Eyes: Conjunctivae and EOM are normal.  Neck: Normal range of motion. Neck supple.  Cardiovascular: Normal rate and regular rhythm.   Pulmonary/Chest: Effort normal. No respiratory distress. He has no wheezes.  Abdominal: Soft. He exhibits no distension.  Musculoskeletal: Normal range of motion. He exhibits no edema or tenderness.  Neurological: He is alert and oriented to person, place, and time.  Skin: Skin is warm and dry. No rash noted. No erythema. No pallor.  Psychiatric: Thought content normal. His affect is labile. He is agitated. Cognition and memory are normal.          Assessment & Plan:   HIV: check labs today though they will not be encouraging with her being off of her Atripla, she needs to have labs checked again as soon as she establishes care in [place]. I have given her one year rx of Atripla but she needs to prove that she is still  suppressing her virus--I expect she will  Polyuria: likely due to her DM not being controlled. She has never been on more than Metformin once daily  DM: poorly controlled since not on metformin  HTN: rewrote rx for aldactone  Transgender: aldactone rx written  I spent greater than 25 minutes with the patient including greater than 50% of time in face to face counsel of the patient with re to her HIV, Polyuria, DM, HTN, Transgender, and in coordination of her care.

## 2015-04-27 NOTE — Progress Notes (Signed)
[name] is here for her week 144 visit for The HAILO Study: A Long Term follow-up of Older HIV-Infected Adults in the ACTG, an observational study addressing the issues of aging, HIV infection and Inflammation. She says she has been out of all of her meds since mid October. She has noticed an increase in depression and voiding very frequently as well as increased thirst. I ordered a POCT UA to check for infection off study. Her BP is up to 165/94. She says [place] weeks and has already reached out to [place] for case management. I will try to find a research site that is doing HAILO close to her address in [place] to transfer her to. .Marland Kitchen

## 2015-04-28 ENCOUNTER — Telehealth: Payer: Self-pay | Admitting: *Deleted

## 2015-04-28 LAB — URINALYSIS, MICROSCOPIC ONLY
BACTERIA UA: NONE SEEN [HPF]
Casts: NONE SEEN [LPF]
Crystals: NONE SEEN [HPF]
RBC / HPF: NONE SEEN RBC/HPF (ref <=2)
Squamous Epithelial / LPF: NONE SEEN [HPF] (ref <=5)
WBC UA: NONE SEEN WBC/HPF (ref <=5)
Yeast: NONE SEEN [HPF]

## 2015-04-28 LAB — URINALYSIS, ROUTINE W REFLEX MICROSCOPIC
BILIRUBIN URINE: NEGATIVE
Hgb urine dipstick: NEGATIVE
Ketones, ur: NEGATIVE
Leukocytes, UA: NEGATIVE
NITRITE: NEGATIVE
PROTEIN: NEGATIVE
Specific Gravity, Urine: 1.038 — ABNORMAL HIGH (ref 1.001–1.035)
pH: 6 (ref 5.0–8.0)

## 2015-04-28 LAB — POCT URINALYSIS DIPSTICK
Bilirubin, UA: NEGATIVE
Blood, UA: NEGATIVE
Glucose, UA: 500
Ketones, UA: NEGATIVE
LEUKOCYTES UA: NEGATIVE
NITRITE UA: NEGATIVE
PROTEIN UA: NEGATIVE
SPEC GRAV UA: 1.01
UROBILINOGEN UA: 0.2
pH, UA: 6

## 2015-04-28 LAB — CREATININE, URINE, RANDOM: CREATININE, URINE: 31 mg/dL (ref 20–370)

## 2015-04-28 NOTE — Telephone Encounter (Signed)
[name] BumpsJessica called back after texting her and I told her she needed to go to the local ED for blood sugar control. I told her to tell them that we drew labs yesterday and that her sugar is over 500. She has my phone number to give them if they need any info.

## 2015-04-28 NOTE — Telephone Encounter (Signed)
Thanks Kim.

## 2015-04-28 NOTE — Telephone Encounter (Signed)
Per Dr Daiva EvesVan Dam message called the patient and unable to reach or leave a message. Patient is in research and asked Selena BattenKim, RN if she could contact the patient and she will try to reach her as well. Will try again later.

## 2015-04-28 NOTE — Telephone Encounter (Signed)
[name]'s glucose from blood draw yesterday was 512. Attempted to call her and then left text that she urgently needed to call us because labs were abnormal. She leaves out of town, over 1 1/2 hours away and needs to be instructed to go to the ED at her local hospital.

## 2015-04-29 LAB — URINE CULTURE
Colony Count: NO GROWTH
ORGANISM ID, BACTERIA: NO GROWTH

## 2015-05-13 ENCOUNTER — Encounter: Payer: Self-pay | Admitting: Infectious Disease

## 2015-11-18 ENCOUNTER — Other Ambulatory Visit (HOSPITAL_COMMUNITY)
Admission: RE | Admit: 2015-11-18 | Discharge: 2015-11-18 | Disposition: A | Discharge status: Home / Self Care | Payer: Medicaid Other | Source: Ambulatory Visit | Attending: Infectious Disease | Admitting: Infectious Disease

## 2015-11-18 ENCOUNTER — Encounter (INDEPENDENT_AMBULATORY_CARE_PROVIDER_SITE_OTHER): Payer: Medicaid Other | Admitting: *Deleted

## 2015-11-18 VITALS — BP 124/78 | HR 80 | Temp 98.2°F | Resp 16 | Wt 267.5 lb

## 2015-11-18 DIAGNOSIS — Z006 Encounter for examination for normal comparison and control in clinical research program: Secondary | ICD-10-CM

## 2015-11-18 DIAGNOSIS — Z113 Encounter for screening for infections with a predominantly sexual mode of transmission: Secondary | ICD-10-CM

## 2015-11-18 LAB — CBC WITH DIFFERENTIAL/PLATELET
BASOS PCT: 0 %
Basophils Absolute: 0 cells/uL (ref 0–200)
EOS PCT: 2 %
Eosinophils Absolute: 232 cells/uL (ref 15–500)
HEMATOCRIT: 47.6 % (ref 38.5–50.0)
Hemoglobin: 16.4 g/dL (ref 13.2–17.1)
LYMPHS ABS: 4524 {cells}/uL — AB (ref 850–3900)
LYMPHS PCT: 39 %
MCH: 32.3 pg (ref 27.0–33.0)
MCHC: 34.5 g/dL (ref 32.0–36.0)
MCV: 93.7 fL (ref 80.0–100.0)
MONO ABS: 928 {cells}/uL (ref 200–950)
MPV: 10.5 fL (ref 7.5–12.5)
Monocytes Relative: 8 %
NEUTROS PCT: 51 %
Neutro Abs: 5916 cells/uL (ref 1500–7800)
Platelets: 286 10*3/uL (ref 140–400)
RBC: 5.08 MIL/uL (ref 4.20–5.80)
RDW: 12.1 % (ref 11.0–15.0)
WBC: 11.6 10*3/uL — AB (ref 3.8–10.8)

## 2015-11-18 LAB — COMPREHENSIVE METABOLIC PANEL
ALBUMIN: 3.9 g/dL (ref 3.6–5.1)
ALK PHOS: 89 U/L (ref 40–115)
ALT: 59 U/L — AB (ref 9–46)
AST: 39 U/L (ref 10–40)
BUN: 9 mg/dL (ref 7–25)
CHLORIDE: 101 mmol/L (ref 98–110)
CO2: 26 mmol/L (ref 20–31)
CREATININE: 0.56 mg/dL — AB (ref 0.60–1.35)
Calcium: 8.8 mg/dL (ref 8.6–10.3)
Glucose, Bld: 246 mg/dL — ABNORMAL HIGH (ref 65–99)
POTASSIUM: 4 mmol/L (ref 3.5–5.3)
Sodium: 136 mmol/L (ref 135–146)
TOTAL PROTEIN: 6.6 g/dL (ref 6.1–8.1)
Total Bilirubin: 0.6 mg/dL (ref 0.2–1.2)

## 2015-11-18 NOTE — Progress Notes (Signed)
[name] BumpsJessica is here for her week 168 visit for The HAILO Study: A Long Term follow-up of Older HIV-Infected Adults in the ACTG, an observational study addressing the issues of aging, HIV infection and Inflammation. She says she has been doing okay, except for some sores that have been coming up on her feet over the past few months. They will blister and drain and heal over, similar to her hydradenitis lesions. She also has one large lesion that keeps coming up on her left buttock that pops every few days and drains pus and blood. She said that it popped this morning and the area is a little swollen, but not red or inflamed. After her last visit and her blood sugar was 514 she did not go to the ED like she was instructed to. She said she resumed her metformin once she got new prescriptions from Dr. Daiva EvesVan Dam and that morning of the labs she had a large iced coffee and thought that may have elevated her labs. She says that it is much better now and she is not having the increased voiding and thirst like she was then. Her depression is also more stable since she resumed the lexapro. She will return for study in November.

## 2015-11-18 NOTE — Addendum Note (Signed)
Addended by: Phill MyronEPPERSON, KIMBERLY C on: 11/18/2015 02:07 PM   Modules accepted: Medications

## 2015-11-19 LAB — RPR

## 2015-11-19 LAB — CYTOLOGY, (ORAL, ANAL, URETHRAL) ANCILLARY ONLY
CHLAMYDIA, DNA PROBE: NEGATIVE
Chlamydia: NEGATIVE
Neisseria Gonorrhea: NEGATIVE
Neisseria Gonorrhea: POSITIVE — AB

## 2015-11-19 LAB — URINE CYTOLOGY ANCILLARY ONLY
CHLAMYDIA, DNA PROBE: NEGATIVE
NEISSERIA GONORRHEA: NEGATIVE

## 2015-11-20 ENCOUNTER — Other Ambulatory Visit: Payer: Self-pay | Admitting: *Deleted

## 2015-11-20 ENCOUNTER — Telehealth: Payer: Self-pay | Admitting: *Deleted

## 2015-11-20 NOTE — Telephone Encounter (Addendum)
[name]'s oral cytology report is positive for gonorrhea. I discussed it with Dr. Daiva EvesVan Dam who said she needs to go the [facility] [place] she is and get treated with ceftriaxone and zithromax. I called her and she would rather come here Monday for the injection and the oral medication.

## 2015-11-23 ENCOUNTER — Ambulatory Visit (INDEPENDENT_AMBULATORY_CARE_PROVIDER_SITE_OTHER): Payer: Medicaid Other | Admitting: *Deleted

## 2015-11-23 DIAGNOSIS — A64 Unspecified sexually transmitted disease: Secondary | ICD-10-CM | POA: Diagnosis not present

## 2015-11-23 MED ORDER — AZITHROMYCIN 250 MG PO TABS
ORAL_TABLET | ORAL | Status: DC
Start: 2015-11-23 — End: 2016-06-02

## 2015-11-23 MED ORDER — LIDOCAINE HCL 1 % IJ SOLN
250.0000 mg | Freq: Once | INTRAMUSCULAR | Status: AC
  Administered 2015-11-23: 250 mg via INTRAMUSCULAR

## 2015-11-23 MED ORDER — AZITHROMYCIN 250 MG PO TABS: ORAL_TABLET | ORAL | Status: DC

## 2015-11-28 LAB — HIV-1 RNA QUANT-NO REFLEX-BLD: HIV-1 RNA Viral Load: <40

## 2015-12-01 ENCOUNTER — Encounter: Payer: Self-pay | Admitting: Infectious Disease

## 2015-12-23 ENCOUNTER — Ambulatory Visit: Payer: Medicaid Other | Admitting: Infectious Disease

## 2016-04-26 ENCOUNTER — Encounter (INDEPENDENT_AMBULATORY_CARE_PROVIDER_SITE_OTHER): Payer: Self-pay | Admitting: *Deleted

## 2016-04-26 VITALS — BP 122/83 | HR 86 | Temp 97.9°F | Wt 269.5 lb

## 2016-04-26 DIAGNOSIS — Z006 Encounter for examination for normal comparison and control in clinical research program: Secondary | ICD-10-CM

## 2016-04-26 LAB — COMPREHENSIVE METABOLIC PANEL
ALBUMIN: 4.2 g/dL (ref 3.6–5.1)
ALK PHOS: 109 U/L (ref 40–115)
ALT: 64 U/L — AB (ref 9–46)
AST: 45 U/L — ABNORMAL HIGH (ref 10–40)
BUN: 13 mg/dL (ref 7–25)
CALCIUM: 9.4 mg/dL (ref 8.6–10.3)
CHLORIDE: 98 mmol/L (ref 98–110)
CO2: 27 mmol/L (ref 20–31)
Creat: 0.66 mg/dL (ref 0.60–1.35)
Glucose, Bld: 208 mg/dL — ABNORMAL HIGH (ref 65–99)
POTASSIUM: 4.4 mmol/L (ref 3.5–5.3)
Sodium: 134 mmol/L — ABNORMAL LOW (ref 135–146)
TOTAL PROTEIN: 7.1 g/dL (ref 6.1–8.1)
Total Bilirubin: 0.3 mg/dL (ref 0.2–1.2)

## 2016-04-26 LAB — LIPID PANEL
CHOLESTEROL: 198 mg/dL (ref <200)
HDL: 37 mg/dL — AB (ref >40)
LDL CALC: 101 mg/dL — AB (ref <100)
TRIGLYCERIDES: 300 mg/dL — AB (ref <150)
Total CHOL/HDL Ratio: 5.4 Ratio — ABNORMAL HIGH (ref <5.0)
VLDL: 60 mg/dL — ABNORMAL HIGH (ref <30)

## 2016-04-26 LAB — HEMOGLOBIN A1C
HEMOGLOBIN A1C: 8.8 % — AB (ref <5.7)
MEAN PLASMA GLUCOSE: 206 mg/dL

## 2016-04-26 NOTE — Progress Notes (Signed)
[name] is here for week 192 A5322, HAILO Study: A Long Term follow-up of Older HIV-Infected Adults in the ACTG, an observational study addressing the issues of aging, HIV infection and Inflammation. No new complaints or concerns verbalized. Fasting labs drawn. Next visit scheduled for 10/03/16 @ 10:00am.

## 2016-04-27 LAB — HEPATITIS B SURFACE ANTIGEN: Hepatitis B Surface Ag: NEGATIVE

## 2016-04-27 LAB — CD4/CD8 (T-HELPER/T-SUPPRESSOR CELL)
CD4%: 50.3
CD4: 2465
CD8 % Suppressor T Cell: 29.9
CD8: 1465

## 2016-04-27 LAB — HEPATITIS B SURFACE ANTIBODY,QUALITATIVE: Hep B S Ab: NEGATIVE

## 2016-04-27 LAB — HEPATITIS C ANTIBODY: HCV AB: NEGATIVE

## 2016-04-27 LAB — PROTEIN, URINE, RANDOM: TOTAL PROTEIN, URINE: 51 mg/dL — AB (ref 5–25)

## 2016-04-27 LAB — CREATININE, URINE, RANDOM: Creatinine, Urine: 183 mg/dL (ref 20–370)

## 2016-05-16 ENCOUNTER — Other Ambulatory Visit: Payer: Self-pay | Admitting: Infectious Disease

## 2016-05-18 ENCOUNTER — Other Ambulatory Visit: Payer: Self-pay

## 2016-05-18 LAB — HIV-1 RNA QUANT-NO REFLEX-BLD

## 2016-05-18 MED ORDER — ESCITALOPRAM OXALATE 20 MG PO TABS: 20.0000 mg | ORAL_TABLET | Freq: Every day | ORAL | 0 refills | Status: DC

## 2016-05-18 MED ORDER — SPIRONOLACTONE 50 MG PO TABS: 50.0000 mg | ORAL_TABLET | Freq: Every day | ORAL | 0 refills | Status: DC

## 2016-05-18 MED ORDER — EFAVIRENZ-EMTRICITAB-TENOFOVIR 600-200-300 MG PO TABS: 1.0000 | ORAL_TABLET | Freq: Every day | ORAL | 0 refills | Status: DC

## 2016-05-18 NOTE — Telephone Encounter (Signed)
Patient states all of her scripts were cancelled at pharmacy.  It looks as if they were denied due to last office visit being November, 2017. She will need office visit within 30 days and will only be given a 30 day supply.    [name] Josephsammy K Aishwarya Shiplett, RN

## 2016-05-19 ENCOUNTER — Other Ambulatory Visit: Payer: Self-pay | Admitting: *Deleted

## 2016-05-19 MED ORDER — METFORMIN HCL 500 MG PO TABS: 500.0000 mg | ORAL_TABLET | Freq: Every day | ORAL | 11 refills | Status: DC

## 2016-05-30 ENCOUNTER — Encounter: Payer: Self-pay | Admitting: Infectious Disease

## 2016-05-31 ENCOUNTER — Inpatient Hospital Stay (HOSPITAL_COMMUNITY)
Admission: EM | Admit: 2016-05-31 | Discharge: 2016-06-02 | DRG: 603 | Disposition: A | Discharge status: Home / Self Care | Payer: Medicaid Other | Attending: Internal Medicine | Admitting: Internal Medicine

## 2016-05-31 ENCOUNTER — Emergency Department (HOSPITAL_COMMUNITY): Payer: Medicaid Other

## 2016-05-31 ENCOUNTER — Encounter (HOSPITAL_COMMUNITY): Payer: Self-pay

## 2016-05-31 DIAGNOSIS — B20 Human immunodeficiency virus [HIV] disease: Secondary | ICD-10-CM | POA: Diagnosis not present

## 2016-05-31 DIAGNOSIS — Z79899 Other long term (current) drug therapy: Secondary | ICD-10-CM

## 2016-05-31 DIAGNOSIS — Z8614 Personal history of Methicillin resistant Staphylococcus aureus infection: Secondary | ICD-10-CM

## 2016-05-31 DIAGNOSIS — F419 Anxiety disorder, unspecified: Secondary | ICD-10-CM | POA: Diagnosis present

## 2016-05-31 DIAGNOSIS — E669 Obesity, unspecified: Secondary | ICD-10-CM | POA: Diagnosis present

## 2016-05-31 DIAGNOSIS — Z6834 Body mass index (BMI) 34.0-34.9, adult: Secondary | ICD-10-CM | POA: Diagnosis not present

## 2016-05-31 DIAGNOSIS — Z22322 Carrier or suspected carrier of Methicillin resistant Staphylococcus aureus: Secondary | ICD-10-CM

## 2016-05-31 DIAGNOSIS — F17210 Nicotine dependence, cigarettes, uncomplicated: Secondary | ICD-10-CM | POA: Diagnosis present

## 2016-05-31 DIAGNOSIS — J45909 Unspecified asthma, uncomplicated: Secondary | ICD-10-CM | POA: Diagnosis present

## 2016-05-31 DIAGNOSIS — K219 Gastro-esophageal reflux disease without esophagitis: Secondary | ICD-10-CM | POA: Diagnosis present

## 2016-05-31 DIAGNOSIS — F411 Generalized anxiety disorder: Secondary | ICD-10-CM | POA: Diagnosis not present

## 2016-05-31 DIAGNOSIS — Z7984 Long term (current) use of oral hypoglycemic drugs: Secondary | ICD-10-CM

## 2016-05-31 DIAGNOSIS — I10 Essential (primary) hypertension: Secondary | ICD-10-CM | POA: Diagnosis present

## 2016-05-31 DIAGNOSIS — Z21 Asymptomatic human immunodeficiency virus [HIV] infection status: Secondary | ICD-10-CM | POA: Diagnosis present

## 2016-05-31 DIAGNOSIS — J4520 Mild intermittent asthma, uncomplicated: Secondary | ICD-10-CM

## 2016-05-31 DIAGNOSIS — E1169 Type 2 diabetes mellitus with other specified complication: Secondary | ICD-10-CM | POA: Diagnosis not present

## 2016-05-31 DIAGNOSIS — L03211 Cellulitis of face: Secondary | ICD-10-CM | POA: Diagnosis present

## 2016-05-31 LAB — CBG MONITORING, ED: Glucose-Capillary: 204 mg/dL — ABNORMAL HIGH (ref 65–99)

## 2016-05-31 LAB — URINALYSIS, ROUTINE W REFLEX MICROSCOPIC
BILIRUBIN URINE: NEGATIVE
Bacteria, UA: NONE SEEN
Hgb urine dipstick: NEGATIVE
KETONES UR: NEGATIVE mg/dL
LEUKOCYTES UA: NEGATIVE
Nitrite: NEGATIVE
PH: 5 (ref 5.0–8.0)
Protein, ur: 30 mg/dL — AB
Specific Gravity, Urine: 1.037 — ABNORMAL HIGH (ref 1.005–1.030)

## 2016-05-31 LAB — CBC WITH DIFFERENTIAL/PLATELET
BASOS ABS: 0 10*3/uL (ref 0.0–0.1)
BASOS PCT: 0 %
EOS ABS: 0.3 10*3/uL (ref 0.0–0.7)
Eosinophils Relative: 2 %
HCT: 48.2 % (ref 39.0–52.0)
HEMOGLOBIN: 17.4 g/dL — AB (ref 13.0–17.0)
LYMPHS PCT: 26 %
Lymphs Abs: 4 10*3/uL (ref 0.7–4.0)
MCH: 34.6 pg — ABNORMAL HIGH (ref 26.0–34.0)
MCHC: 36.1 g/dL — ABNORMAL HIGH (ref 30.0–36.0)
MCV: 95.8 fL (ref 78.0–100.0)
MONOS PCT: 8 %
Monocytes Absolute: 1.2 10*3/uL — ABNORMAL HIGH (ref 0.1–1.0)
NEUTROS PCT: 64 %
Neutro Abs: 9.9 10*3/uL — ABNORMAL HIGH (ref 1.7–7.7)
Platelets: 277 10*3/uL (ref 150–400)
RBC: 5.03 MIL/uL (ref 4.22–5.81)
RDW: 12.4 % (ref 11.5–15.5)
WBC: 15.4 10*3/uL — ABNORMAL HIGH (ref 4.0–10.5)

## 2016-05-31 LAB — COMPREHENSIVE METABOLIC PANEL
ALBUMIN: 3.8 g/dL (ref 3.5–5.0)
ALT: 79 U/L — ABNORMAL HIGH (ref 17–63)
AST: 52 U/L — AB (ref 15–41)
Alkaline Phosphatase: 121 U/L (ref 38–126)
Anion gap: 9 (ref 5–15)
BUN: 11 mg/dL (ref 6–20)
CHLORIDE: 101 mmol/L (ref 101–111)
CO2: 27 mmol/L (ref 22–32)
Calcium: 9 mg/dL (ref 8.9–10.3)
Creatinine, Ser: 0.69 mg/dL (ref 0.61–1.24)
GFR calc Af Amer: >60 mL/min (ref >60)
GFR calc non Af Amer: >60 mL/min (ref >60)
GLUCOSE: 282 mg/dL — AB (ref 65–99)
POTASSIUM: 3.9 mmol/L (ref 3.5–5.1)
Sodium: 137 mmol/L (ref 135–145)
Total Bilirubin: 0.8 mg/dL (ref 0.3–1.2)
Total Protein: 7.5 g/dL (ref 6.5–8.1)

## 2016-05-31 LAB — I-STAT CG4 LACTIC ACID, ED
LACTIC ACID, VENOUS: 2.41 mmol/L — AB (ref 0.5–1.9)
Lactic Acid, Venous: 0.95 mmol/L (ref 0.5–1.9)

## 2016-05-31 MED ORDER — MOMETASONE FURO-FORMOTEROL FUM 100-5 MCG/ACT IN AERO
2.0000 | INHALATION_SPRAY | Freq: Two times a day (BID) | RESPIRATORY_TRACT | Status: DC
  Administered 2016-06-01 – 2016-06-02 (×3): 2 via RESPIRATORY_TRACT
  Filled 2016-05-31: qty 8.8

## 2016-05-31 MED ORDER — INSULIN ASPART 100 UNIT/ML ~~LOC~~ SOLN
0.0000 [IU] | Freq: Three times a day (TID) | SUBCUTANEOUS | Status: DC
  Administered 2016-06-01: 2 [IU] via SUBCUTANEOUS
  Administered 2016-06-01: 3 [IU] via SUBCUTANEOUS
  Administered 2016-06-01 – 2016-06-02 (×2): 2 [IU] via SUBCUTANEOUS
  Administered 2016-06-02: 3 [IU] via SUBCUTANEOUS

## 2016-05-31 MED ORDER — HYDROMORPHONE HCL 2 MG/ML IJ SOLN
1.0000 mg | Freq: Once | INTRAMUSCULAR | Status: AC
  Administered 2016-05-31: 1 mg via INTRAVENOUS
  Filled 2016-05-31: qty 1

## 2016-05-31 MED ORDER — KETOROLAC TROMETHAMINE 15 MG/ML IJ SOLN
15.0000 mg | Freq: Once | INTRAMUSCULAR | Status: AC
  Administered 2016-05-31: 15 mg via INTRAVENOUS
  Filled 2016-05-31: qty 1

## 2016-05-31 MED ORDER — ONDANSETRON HCL 4 MG/2ML IJ SOLN: 4.0000 mg | Freq: Four times a day (QID) | INTRAMUSCULAR | Status: DC | PRN

## 2016-05-31 MED ORDER — VANCOMYCIN HCL 10 G IV SOLR
2000.0000 mg | Freq: Once | INTRAVENOUS | Status: AC
  Administered 2016-05-31: 2000 mg via INTRAVENOUS
  Filled 2016-05-31: qty 2000

## 2016-05-31 MED ORDER — INSULIN ASPART 100 UNIT/ML ~~LOC~~ SOLN
0.0000 [IU] | Freq: Every day | SUBCUTANEOUS | Status: DC
  Administered 2016-06-01: 2 [IU] via SUBCUTANEOUS

## 2016-05-31 MED ORDER — PANTOPRAZOLE SODIUM 40 MG PO TBEC
40.0000 mg | DELAYED_RELEASE_TABLET | Freq: Every day | ORAL | Status: DC
  Administered 2016-06-01 – 2016-06-02 (×3): 40 mg via ORAL
  Filled 2016-05-31 (×3): qty 1

## 2016-05-31 MED ORDER — VANCOMYCIN HCL IN DEXTROSE 1-5 GM/200ML-% IV SOLN
1000.0000 mg | Freq: Three times a day (TID) | INTRAVENOUS | Status: DC
  Administered 2016-06-01 – 2016-06-02 (×5): 1000 mg via INTRAVENOUS
  Filled 2016-05-31 (×6): qty 200

## 2016-05-31 MED ORDER — ALBUTEROL SULFATE (2.5 MG/3ML) 0.083% IN NEBU
2.5000 mg | INHALATION_SOLUTION | RESPIRATORY_TRACT | Status: DC | PRN
  Administered 2016-06-01: 2.5 mg via RESPIRATORY_TRACT
  Filled 2016-05-31: qty 3

## 2016-05-31 MED ORDER — ONDANSETRON HCL 4 MG PO TABS: 4.0000 mg | ORAL_TABLET | Freq: Four times a day (QID) | ORAL | Status: DC | PRN

## 2016-05-31 MED ORDER — SODIUM CHLORIDE 0.9 % IV BOLUS (SEPSIS)
1000.0000 mL | Freq: Once | INTRAVENOUS | Status: AC
  Administered 2016-05-31: 1000 mL via INTRAVENOUS

## 2016-05-31 MED ORDER — EFAVIRENZ-EMTRICITAB-TENOFOVIR 600-200-300 MG PO TABS
1.0000 | ORAL_TABLET | Freq: Every day | ORAL | Status: DC
  Administered 2016-06-01 (×2): 1 via ORAL
  Filled 2016-05-31 (×2): qty 1

## 2016-05-31 MED ORDER — OXYCODONE HCL 5 MG PO TABS
5.0000 mg | ORAL_TABLET | ORAL | Status: DC | PRN
  Administered 2016-05-31 – 2016-06-01 (×5): 5 mg via ORAL
  Filled 2016-05-31 (×5): qty 1

## 2016-05-31 MED ORDER — IOPAMIDOL (ISOVUE-300) INJECTION 61%
INTRAVENOUS | Status: AC
  Administered 2016-05-31: 75 mL
  Filled 2016-05-31: qty 75

## 2016-05-31 MED ORDER — SODIUM CHLORIDE 0.9 % IV BOLUS (SEPSIS)
1000.0000 mL | Freq: Once | INTRAVENOUS | Status: AC
Start: 2016-05-31 — End: 2016-05-31
  Administered 2016-05-31: 1000 mL via INTRAVENOUS

## 2016-05-31 MED ORDER — ENOXAPARIN SODIUM 40 MG/0.4ML ~~LOC~~ SOLN
40.0000 mg | SUBCUTANEOUS | Status: DC
  Administered 2016-06-01 (×2): 40 mg via SUBCUTANEOUS
  Filled 2016-05-31 (×2): qty 0.4

## 2016-05-31 NOTE — Progress Notes (Signed)
Pharmacy Antibiotic Note  [name] is a 45 y.o. male admitted on 05/31/2016 with cellulitis.  Pharmacy has been consulted for vancomycin dosing.  Patient has received vancomycin 2g IV once in the ED.  Plan: Vancomycin 1g IV every 8 hours.  Goal trough 10-15 mcg/mL.  Monitor culture data, renal function and clinical course VT at [name]  Height: 5\' 11"  (180.3 cm) Weight: 250 lb (113.4 kg) IBW/kg (Calculated) : 75.3  Temp (24hrs), Avg:98.2 F (36.8 C), Min:98.2 F (36.8 C), Max:98.2 F (36.8 C)   Recent Labs Lab 05/31/16 1612 05/31/16 1653  WBC 15.4*  --   LATICACIDVEN  --  2.41*    CrCl cannot be calculated (Patient's most recent lab result is older than the maximum 21 days allowed.).    Allergies  Allergen Reactions  . Propoxyphene N-Acetaminophen Other (See Comments)    Stomach cramps    Antimicrobials this admission: Vanc 12/19 >>   Dose adjustments this admission: n/a  Microbiology results:  BCx:   UCx:    Sputum:    MRSA PCR:    Arlean Hoppingorey M. Newman PiesBall, PharmD, BCPS Clinical Pharmacist Pager [phone] 05/31/2016 4:55 PM

## 2016-05-31 NOTE — ED Notes (Signed)
Pt's CBG result was 204. Informed Denyse Amassorey - RN.

## 2016-05-31 NOTE — ED Notes (Signed)
Gave pt bagged lunch (Malaw[place]urkey sandwich and applesauce) and Diet Sprite, per Denyse Amassorey - RN.

## 2016-05-31 NOTE — ED Triage Notes (Signed)
Per Pt, Pt is coming from home with complaints of facial swelling noted to the left side of the face secondary to a MRSA infection. Pt reports having Hx of MRSA in the past.

## 2016-05-31 NOTE — ED Notes (Signed)
Patient transported to CT 

## 2016-05-31 NOTE — ED Notes (Signed)
Pt st's he is having a MRSA outbreak in his nose.  Also st's he has MRSA with sores on buttocks.  Pt c/o severe pain in face.  Pt st's he has been giving Naprosyn for pain that isn't working.  St's he has a high tolerance to pain meds.

## 2016-05-31 NOTE — H&P (Signed)
History and Physical    [name] RUE:[id number] DOB: 27-Aug-1970 DOA: 05/31/2016  Referring MD/NP/PA: Dr. Shaune Pollackameron Isaacs PCP: No primary care provider on file.  Patient coming from: Home  Chief Complaint: Facial swelling  HPI: [name] is a 45 y.o. male with medical history significant of HTN, asthma, HIV on HARRT, DM type I, MRSA carrier; who presents with a 2-3 day history of left-sided facial swelling. Associated symptoms include redness and pain that is worsened with talking or any movement of his mouth. [place], [place] diagnosed with gingivitis and was given naproxen and clindamycin. Patient took this medicine without relief of symptoms. Denies any fever, abdominal pain, nausea, vomiting, diarrhea. patient has had similar symptoms like this process he relates to his colonization with MRSA.   ED Course: On admission to the emergency department patient was seen to be afebrile, heart rate up to 102, blood pressure 163/98, and all other vitals within normal limits. Lab work revealed WBC 15.4, hemoglobin 17.4, glucose 282, and lactic acid 2.41.  Review of Systems: As per HPI otherwise 10 point review of systems negative.   Past Medical History:  Diagnosis Date  . Diabetes mellitus   . HIV disease (HCC)   . Hypertension   . MRSA carrier   . Polyuria 04/27/2015  . Poorly controlled diabetes mellitus (HCC) 04/27/2015  . Transgender 04/27/2015    Past Surgical History:  Procedure Laterality Date  . none       reports that he has been smoking Cigarettes.  He has a 27.00 pack-year smoking history. He has never used smokeless tobacco. He reports that he uses drugs, including Marijuana. He reports that he does not drink alcohol.  Allergies  Allergen Reactions  . Propoxyphene N-Acetaminophen Other (See Comments)    Stomach cramps    No family history on file.  Prior to Admission medications   Medication Sig Start Date End  Date Taking? Authorizing Provider  albuterol (PROVENTIL HFA;VENTOLIN HFA) 108 (90 BASE) MCG/ACT inhaler Inhale 2 puffs into the lungs every 6 (six) hours as needed for wheezing or shortness of breath. 04/27/15   Randall Hissornelius N Van Dam, MD  alprazolam Prudy Feeler(XANAX) 2 MG tablet TAKE 1 TABLET BY MOUTH TWICE DAILY AS NEEDED Patient not taking: Reported on 11/18/2015 07/14/14   Randall Hissornelius N Van Dam, MD  azithromycin (ZITHROMAX) 250 MG tablet Take per package instructions. 11/23/15   Randall Hissornelius N Van Dam, MD  chlorhexidine (PERIDEX) 0.12 % solution Use as directed 15 mLs in the mouth or throat 2 (two) times daily. Patient not taking: Reported on 11/18/2015 09/23/13   Ginnie SmartJeffrey C Hatcher, MD  clindamycin (CLEOCIN) 300 MG capsule Take 1 capsule (300 mg total) by mouth 4 (four) times daily. Patient not taking: Reported on 11/18/2015 09/23/13   Ginnie SmartJeffrey C Hatcher, MD  clotrimazole-betamethasone (LOTRISONE) cream Apply 1 application topically daily as needed (wound care/blisters on feet). Reported on 11/18/2015    Historical Provider, MD  efavirenz-emtricitabine-tenofovir (ATRIPLA) 600-200-300 MG tablet Take 1 tablet by mouth at bedtime. 05/18/16   Randall Hissornelius N Van Dam, MD  escitalopram (LEXAPRO) 20 MG tablet Take 1 tablet (20 mg total) by mouth at bedtime. 05/18/16   Randall Hissornelius N Van Dam, MD  Fluticasone-Salmeterol (ADVAIR) 100-50 MCG/DOSE AEPB Inhale 1 puff into the lungs daily.    Historical Provider, MD  metFORMIN (GLUCOPHAGE) 500 MG tablet Take 1 tablet (500 mg total) by mouth at bedtime. 05/19/16   Randall Hissornelius N Van Dam, MD  morphine (MS CONTIN) 15 MG 12 hr tablet Take 1 tablet (15 mg total) by mouth every 12 (twelve) hours. Patient not taking: Reported on 11/18/2015 09/17/14   Randall Hiss, MD  omeprazole (PRILOSEC) 20 MG capsule TAKE 2 CAPSULES BY MOUTH EVERY NIGHT AT BEDTIME 04/23/15   Randall Hiss, MD  spironolactone (ALDACTONE) 50 MG tablet TAKE 1 TABLET BY MOUTH EVERY DAY 02/02/15   Randall Hiss, MD  spironolactone  (ALDACTONE) 50 MG tablet Take 1 tablet (50 mg total) by mouth daily. 05/18/16   Randall Hiss, MD  tobramycin-dexamethasone [facility]) ophthalmic solution Place 2 drops into the right eye every 6 (six) hours. Patient not taking: Reported on 11/18/2015 02/05/14   Randall Hiss, MD    Physical Exam: Vitals:   05/31/16 1505  BP: 163/98  Pulse: 102  Resp: 18  Temp: 98.2 F (36.8 C)  TempSrc: Oral  SpO2: 97%  Weight: 113.4 kg (250 lb)  Height: 5\' 11"  (1.803 m)      Constitutional: Obese male in NAD, calm, comfortable Vitals:   05/31/16 1505  BP: 163/98  Pulse: 102  Resp: 18  Temp: 98.2 F (36.8 C)  TempSrc: Oral  SpO2: 97%  Weight: 113.4 kg (250 lb)  Height: 5\' 11"  (1.803 m)   Eyes: PERRL, lids and conjunctivae normal ENMT: Mucous membranes are moist. Posterior pharynx clear of any exudate or lesions. Poor dentition. Enlarged tonsils bilaterally. Neck: normal, supple, no masses, no thyromegaly Respiratory: Mild expiratory wheezes appreciated on physical exam. no crackles. Normal respiratory effort. No accessory muscle use.  Cardiovascular: Regular rate and rhythm, no murmurs / rubs / gallops. No extremity edema. 2+ pedal pulses. No carotid bruits.  Abdomen: no tenderness, no masses palpated. No hepatosplenomegaly. Bowel sounds positive.  Musculoskeletal: no clubbing / cyanosis. No joint deformity upper and lower extremities. Good ROM, no contractures. Normal muscle tone.  Skin: Left-sided facial swelling and erythema noted along the nasal bridge Neurologic: CN 2-12 grossly intact. Sensation intact, DTR normal. Strength 5/5 in all 4.  Psychiatric: Normal judgment and insight. Alert and oriented x 3. Normal mood.     Labs on Admission: I have personally reviewed following labs and imaging studies  CBC:  Recent Labs Lab 05/31/16 1612  WBC 15.4*  NEUTROABS 9.9*  HGB 17.4*  HCT 48.2  MCV 95.8  PLT 277   Basic Metabolic Panel:  Recent Labs Lab  05/31/16 1612  NA 137  K 3.9  CL 101  CO2 27  GLUCOSE 282*  BUN 11  CREATININE 0.69  CALCIUM 9.0   GFR: Estimated Creatinine Clearance: 149.3 mL/min (by C-G formula based on SCr of 0.69 mg/dL). Liver Function Tests:  Recent Labs Lab 05/31/16 1612  AST 52*  ALT 79*  ALKPHOS 121  BILITOT 0.8  PROT 7.5  ALBUMIN 3.8   No results for input(s): LIPASE, AMYLASE in the last 168 hours. No results for input(s): AMMONIA in the last 168 hours. Coagulation Profile: No results for input(s): INR, PROTIME in the last 168 hours. Cardiac Enzymes: No results for input(s): CKTOTAL, CKMB, CKMBINDEX, TROPONINI in the last 168 hours. BNP (last 3 results) No results for input(s): PROBNP in the last 8760 hours. HbA1C: No results for input(s): HGBA1C in the last 72 hours. CBG: No results for input(s): GLUCAP in the last 168 hours. Lipid Profile: No results for input(s): CHOL, HDL, LDLCALC, TRIG, CHOLHDL, LDLDIRECT in the last 72 hours. Thyroid Function Tests: No results for input(s):  TSH, T4TOTAL, FREET4, T3FREE, THYROIDAB in the last 72 hours. Anemia Panel: No results for input(s): VITAMINB12, FOLATE, FERRITIN, TIBC, IRON, RETICCTPCT in the last 72 hours. Urine analysis:    Component Value Date/Time   COLORURINE YELLOW 04/27/2015 1145   APPEARANCEUR CLEAR 04/27/2015 1145   LABSPEC 1.038 (H) 04/27/2015 1145   PHURINE 6.0 04/27/2015 1145   GLUCOSEU 3+ (A) 04/27/2015 1145   GLUCOSEU NEG mg/dL 16/10/960404/26/2011 [id number]   HGBUR NEGATIVE 04/27/2015 1145   HGBUR negative 11/04/2008 0900   BILIRUBINUR negative 04/28/2015 1721   KETONESUR NEGATIVE 04/27/2015 1145   PROTEINUR negative 04/28/2015 1721   PROTEINUR NEGATIVE 04/27/2015 1145   UROBILINOGEN 0.2 04/28/2015 1721   UROBILINOGEN 0.2 12/11/2012 1702   NITRITE negative 04/28/2015 1721   NITRITE NEGATIVE 04/27/2015 1145   LEUKOCYTESUR Negative 04/28/2015 1721   Sepsis Labs: No results found for this or any previous visit (from the past 240  hour(s)).   Radiological Exams on Admission: Ct Maxillofacial W Contrast  Result Date: 05/31/2016 CLINICAL DATA:  Severe facial pain EXAM: CT MAXILLOFACIAL WITH CONTRAST TECHNIQUE: Multidetector CT imaging of the maxillofacial structures was performed. Multiplanar CT image reconstructions were also generated. A small metallic BB was placed on the right temple in order to reliably differentiate right from left. COMPARISON:  CT soft tissue neck 05/14/2007 FINDINGS: Osseous: --Complex facial fracture types: No LeFort, zygomaticomaxillary complex or nasoorbitoethmoidal fracture. --Simple fracture types: None. --Mandible: No fracture or dislocation. Orbits: The globes appear intact. Normal appearance of the intra- and extraconal fat. Symmetric extraocular muscles. Sinuses: Left maxillary retention cyst. No fluid levels or advanced mucosal thickening. Soft tissues: There is inflammatory infiltration of the superficial soft tissues of the upper lip, extending superiorly to the inferior margin of the nose. There is no fluid collection. The left submandibular gland is absent or severely atrophic. There are bilateral presumed reactive cervical lymph nodes, measuring up to 1.4 centimeters at left level IIa. Limited intracranial: Normal. IMPRESSION: 1. Inflammatory findings of the upper lip extending to the inferior nasal margin without abscess or drainable fluid collection. 2. Reactive cervical lymphadenopathy. Electronically Signed   By: Deatra RobinsonKevin  Herman M.D.   On: 05/31/2016 17:51      Assessment/Plan Facial cellulitis: Acute. Patient presents with a 2 to three-day history of left-sided facial swelling, redness, and pain. CT of the face reveals signs of cellulitis without underlying abscess. - Admit to a MedSurg bed - Continue IV Vancomycin for MRSA coverage - Follow up blood cultures  HIV on HAART:  patient on Atripla and reports taking it on a regular basis - Continue Atripla  Essential hypertension -  Continue spironolactone  Asthma:  - Albuterol neb prn SOB/Wheezing - Pharmacy substitution of Dulera  Diabetes mellitus type II: Patient's last hemoglobin A1c was noted to be 8.8 on 04/26/2016. There is current diabetic regimen consist only of metformin at this time. - Hypoglycemic protocol  - Held metformin - CBGs every before meals and at bedtime with sensitive sliding scale insulin.  Anxiety - Continue Lexapro   Tobacco abuse - Counseled on the need for cessation of tobacco - Nicotine patch while hospitalized  GERD - Pharmacy substitution of Protonix for Prilosec  DVT prophylaxis: lovenox Code Status: Full Family Communication: No family present at bedside Disposition Plan: likely discharge home in 2 days Consults called: None Admission status: Inpatient  Clydie Braunondell A Jermari Tamargo MD [facility] Pager [phone]  If 7PM-7AM, please contact night-coverage [email or web address] Password [email or web address]  05/31/2016, 7:36 PM    \

## 2016-05-31 NOTE — ED Provider Notes (Signed)
[facility]-EMERGENCY DEPT Provider Note   CSN: [id number] Arrival date & time: 05/31/16  1457     History   Chief Complaint Chief Complaint  Patient presents with  . Facial Swelling    HPI [name] OleaSteven A Luft is a 45 y.o. male.  HPI 45 yo M with PMHx of HIV, MRSA, recurrent infections here with facial pain and swelling. Pt states that over the past 2-3 days, he has had progressively worsening nasal pain and facial swelling. This is similar to his prior facial infection from staph. He has also noticed redness and drainage along two gluteal areas though those do not bother him "as much." He has had no fevers but does have chills. He is on clinda prescribed by another hospital and his sx have worsened, now with worsening chills, redness, and pain. Pain made worse with palpation and movement. No alleviating factors. He has been taking his HAART therapy as prescribed.   Past Medical History:  Diagnosis Date  . Diabetes mellitus   . HIV disease (HCC)   . Hypertension   . MRSA carrier   . Polyuria 04/27/2015  . Poorly controlled diabetes mellitus (HCC) 04/27/2015  . Transgender 04/27/2015    Patient Active Problem List   Diagnosis Date Noted  . Polyuria 04/27/2015  . Poorly controlled diabetes mellitus (HCC) 04/27/2015  . Transgender 04/27/2015  . ANUG (acute necrotizing ulcerative gingivitis) 09/23/2013  . Tinea pedis of both feet 07/24/2013  . Hearing loss secondary to cerumen impaction 02/07/2013  . Parotitis 12/03/2012  . Conjunctivitis 12/03/2012  . Rash 02/14/2012  . Scabies 07/20/2011  . Smoker 03/07/2011  . Cervicalgia 03/07/2011  . 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 CHRONIC PAIN SYNDROME 06/11/2009  . 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 SHOULDER PAIN, CHRONIC 01/13/2009  . ANXIETY STATE, UNSPECIFIED 11/04/2008  . ROTATOR CUFF INJURY, RIGHT SHOULDER 11/04/2008  . HYPOKALEMIA 09/27/2008  . SWELLING MASS OR LUMP IN HEAD AND NECK 09/27/2008  . CHEST PAIN 09/27/2008  .  TRANSAMINASES, SERUM, ELEVATED 09/27/2008  . DIABETES MELLITUS, BORDERLINE 09/09/2008  . ASTHMA, UNSPECIFIED, UNSPECIFIED STATUS 02/07/2008  . LUMBAGO 02/07/2008  . GENDER IDENTITY DISORDER 10/31/2007  . DEPRESSION 10/17/2007  . CELLULITIS AND ABSCESS OF OTHER SPECIFIED SITE 10/17/2007  . PRODUCTIVE COUGH 04/24/2007  . Acute bronchitis 03/02/2007  . DIARRHEA 03/02/2007  . ABSCESS, PARAPHARYNGEAL 12/08/2006  . HIV DISEASE 04/14/2006  . HIDRADENITIS SUPPURATIVA 04/14/2006    Past Surgical History:  Procedure Laterality Date  . none         Home Medications    Prior to Admission medications   Medication Sig Start Date End Date Taking? Authorizing Provider  albuterol (PROVENTIL HFA;VENTOLIN HFA) 108 (90 BASE) MCG/ACT inhaler Inhale 2 puffs into the lungs every 6 (six) hours as needed for wheezing or shortness of breath. 04/27/15   Randall Hissornelius N Van Dam, MD  alprazolam Prudy Feeler(XANAX) 2 MG tablet TAKE 1 TABLET BY MOUTH TWICE DAILY AS NEEDED Patient not taking: Reported on 11/18/2015 07/14/14   Randall Hissornelius N Van Dam, MD  azithromycin (ZITHROMAX) 250 MG tablet Take per package instructions. 11/23/15   Randall Hissornelius N Van Dam, MD  chlorhexidine (PERIDEX) 0.12 % solution Use as directed 15 mLs in the mouth or throat 2 (two) times daily. Patient not taking: Reported on 11/18/2015 09/23/13   Ginnie SmartJeffrey C Hatcher, MD  clindamycin (CLEOCIN) 300 MG capsule Take 1 capsule (300 mg total) by mouth 4 (four) times daily. Patient not taking: Reported on 11/18/2015 09/23/13   Ginnie SmartJeffrey C Hatcher, MD  clotrimazole-betamethasone Thurmond Butts(LOTRISONE)  cream Apply 1 application topically daily as needed (wound care/blisters on feet). Reported on 11/18/2015    Historical Provider, MD  efavirenz-emtricitabine-tenofovir (ATRIPLA) 600-200-300 MG tablet Take 1 tablet by mouth at bedtime. 05/18/16   Randall Hiss, MD  escitalopram (LEXAPRO) 20 MG tablet Take 1 tablet (20 mg total) by mouth at bedtime. 05/18/16   Randall Hiss, MD    Fluticasone-Salmeterol (ADVAIR) 100-50 MCG/DOSE AEPB Inhale 1 puff into the lungs daily.    Historical Provider, MD  metFORMIN (GLUCOPHAGE) 500 MG tablet Take 1 tablet (500 mg total) by mouth at bedtime. 05/19/16   Randall Hiss, MD  morphine (MS CONTIN) 15 MG 12 hr tablet Take 1 tablet (15 mg total) by mouth every 12 (twelve) hours. Patient not taking: Reported on 11/18/2015 09/17/14   Randall Hiss, MD  omeprazole (PRILOSEC) 20 MG capsule TAKE 2 CAPSULES BY MOUTH EVERY NIGHT AT BEDTIME 04/23/15   Randall Hiss, MD  spironolactone (ALDACTONE) 50 MG tablet TAKE 1 TABLET BY MOUTH EVERY DAY 02/02/15   Randall Hiss, MD  spironolactone (ALDACTONE) 50 MG tablet Take 1 tablet (50 mg total) by mouth daily. 05/18/16   Randall Hiss, MD  tobramycin-dexamethasone [facility]) ophthalmic solution Place 2 drops into the right eye every 6 (six) hours. Patient not taking: Reported on 11/18/2015 02/05/14   Randall Hiss, MD    Family History No family history on file.  Social History Social History  Substance Use Topics  . Smoking status: Current Every Day Smoker    Packs/day: 1.00    Years: 27.00    Types: Cigarettes  . Smokeless tobacco: Never Used     Comment: nicotene patch  . Alcohol use No     Allergies   Propoxyphene n-acetaminophen   Review of Systems Review of Systems  Constitutional: Positive for chills and fatigue. Negative for fever.  HENT: Positive for facial swelling. Negative for congestion and rhinorrhea.   Eyes: Negative for visual disturbance.  Respiratory: Negative for cough, shortness of breath and wheezing.   Cardiovascular: Negative for chest pain and leg swelling.  Gastrointestinal: Negative for abdominal pain, diarrhea, nausea and vomiting.  Genitourinary: Negative for dysuria and flank pain.  Musculoskeletal: Negative for neck pain and neck stiffness.  Skin: Positive for rash and wound.  Allergic/Immunologic: Negative for  immunocompromised state.  Neurological: Positive for weakness. Negative for syncope and headaches.  All other systems reviewed and are negative.    Physical Exam Updated Vital Signs BP 163/98 (BP Location: Right Arm)   Pulse 102   Temp 98.2 F (36.8 C) (Oral)   Resp 18   Ht 5\' 11"  (1.803 m)   Wt 250 lb (113.4 kg)   SpO2 97%   BMI 34.87 kg/m   Physical Exam  Constitutional: He appears well-developed and well-nourished. No distress.  HENT:  Head: Normocephalic and atraumatic.  Purulent nasal drainage in bilateral nares without bleeding. Moderate redness and swelling extending to maxillary cheeks. No fluctuance. No asymmetry. Markedly poor dentition.  Eyes: Conjunctivae are normal. Pupils are equal, round, and reactive to light.  Neck: Neck supple.  Cardiovascular: Normal rate, regular rhythm and normal heart sounds.  Exam reveals no friction rub.   No murmur heard. Pulmonary/Chest: Effort normal and breath sounds normal. No respiratory distress. He has no wheezes. He has no rales.  Abdominal: Soft. Bowel sounds are normal. He exhibits no distension. There is no tenderness.  Musculoskeletal: He exhibits no  edema.  Neurological: He is alert. No sensory deficit.  Skin: Skin is warm. Capillary refill takes less than 2 seconds.  Mildly erythematous, but non-indurated areas throughout b/l buttocks without fluctuance or drainage. Multiple old scars  Nursing note and vitals reviewed.    ED Treatments / Results  Labs (all labs ordered are listed, but only abnormal results are displayed) Labs Reviewed  CBC WITH DIFFERENTIAL/PLATELET - Abnormal; Notable for the following:       Result Value   WBC 15.4 (*)    Hemoglobin 17.4 (*)    MCH 34.6 (*)    MCHC 36.1 (*)    Neutro Abs 9.9 (*)    Monocytes Absolute 1.2 (*)    All other components within normal limits  COMPREHENSIVE METABOLIC PANEL - Abnormal; Notable for the following:    Glucose, Bld 282 (*)    AST 52 (*)    ALT 79 (*)     All other components within normal limits  I-STAT CG4 LACTIC ACID, ED - Abnormal; Notable for the following:    Lactic Acid, Venous 2.41 (*)    All other components within normal limits  CULTURE, BLOOD (ROUTINE X 2)  CULTURE, BLOOD (ROUTINE X 2)  I-STAT CG4 LACTIC ACID, ED    EKG  EKG Interpretation None       Radiology Ct Maxillofacial W Contrast  Result Date: 05/31/2016 CLINICAL DATA:  Severe facial pain EXAM: CT MAXILLOFACIAL WITH CONTRAST TECHNIQUE: Multidetector CT imaging of the maxillofacial structures was performed. Multiplanar CT image reconstructions were also generated. A small metallic BB was placed on the right temple in order to reliably differentiate right from left. COMPARISON:  CT soft tissue neck 05/14/2007 FINDINGS: Osseous: --Complex facial fracture types: No LeFort, zygomaticomaxillary complex or nasoorbitoethmoidal fracture. --Simple fracture types: None. --Mandible: No fracture or dislocation. Orbits: The globes appear intact. Normal appearance of the intra- and extraconal fat. Symmetric extraocular muscles. Sinuses: Left maxillary retention cyst. No fluid levels or advanced mucosal thickening. Soft tissues: There is inflammatory infiltration of the superficial soft tissues of the upper lip, extending superiorly to the inferior margin of the nose. There is no fluid collection. The left submandibular gland is absent or severely atrophic. There are bilateral presumed reactive cervical lymph nodes, measuring up to 1.4 centimeters at left level IIa. Limited intracranial: Normal. IMPRESSION: 1. Inflammatory findings of the upper lip extending to the inferior nasal margin without abscess or drainable fluid collection. 2. Reactive cervical lymphadenopathy. Electronically Signed   By: Deatra RobinsonKevin  Herman M.D.   On: 05/31/2016 17:51    Procedures Procedures (including critical care time)  Medications Ordered in ED Medications  vancomycin (VANCOCIN) 2,000 mg in sodium chloride  0.9 % 500 mL IVPB (2,000 mg Intravenous New Bag/Given 05/31/16 1743)  sodium chloride 0.9 % bolus 1,000 mL (not administered)  vancomycin (VANCOCIN) IVPB 1000 mg/200 mL premix (not administered)  HYDROmorphone (DILAUDID) injection 1 mg (not administered)  ketorolac (TORADOL) 15 MG/ML injection 15 mg (not administered)  sodium chloride 0.9 % bolus 1,000 mL (1,000 mLs Intravenous New Bag/Given 05/31/16 1621)  HYDROmorphone (DILAUDID) injection 1 mg (1 mg Intravenous Given 05/31/16 1624)  iopamidol (ISOVUE-300) 61 % injection (75 mLs  Contrast Given 05/31/16 1722)     Initial Impression / Assessment and Plan / ED Course  I have reviewed the triage vital signs and the nursing notes.  Pertinent labs & imaging results that were available during my care of the patient were reviewed by me and considered in my medical  decision making (see chart for details).  Clinical Course     45 yo transgender male to male with h/o HIV, recurrent MRSA infections who p/w facial redness and swelling despite outpt chlorhexidine washes and clinda at high dose x 2 days. + fevers and chills at home. On arrival, VSS. Pt non-toxic. He does have moderate facial induration and redness. Lab work c/f early sepsis with WBC 15k, LA 2.41. IVF given. CT Face shows facial cellulitis w/o abscess. Will send BCx, start IV Vanc given multiple risk factors, and admit. Pt in agreement.  Final Clinical Impressions(s) / ED Diagnoses   Final diagnoses:  Facial cellulitis  HIV (human immunodeficiency virus infection) (HCC)  History of MRSA infection      Shaune Pollack, MD 05/31/16 [phone]

## 2016-06-01 DIAGNOSIS — I10 Essential (primary) hypertension: Secondary | ICD-10-CM | POA: Diagnosis present

## 2016-06-01 DIAGNOSIS — E1169 Type 2 diabetes mellitus with other specified complication: Secondary | ICD-10-CM | POA: Diagnosis present

## 2016-06-01 DIAGNOSIS — E669 Obesity, unspecified: Secondary | ICD-10-CM

## 2016-06-01 LAB — BASIC METABOLIC PANEL
Anion gap: 7 (ref 5–15)
BUN: 8 mg/dL (ref 6–20)
CALCIUM: 8.3 mg/dL — AB (ref 8.9–10.3)
CO2: 26 mmol/L (ref 22–32)
Chloride: 105 mmol/L (ref 101–111)
Creatinine, Ser: 0.53 mg/dL — ABNORMAL LOW (ref 0.61–1.24)
GFR calc Af Amer: >60 mL/min (ref >60)
GLUCOSE: 168 mg/dL — AB (ref 65–99)
Potassium: 3.7 mmol/L (ref 3.5–5.1)
Sodium: 138 mmol/L (ref 135–145)

## 2016-06-01 LAB — GLUCOSE, CAPILLARY
GLUCOSE-CAPILLARY: 239 mg/dL — AB (ref 65–99)
GLUCOSE-CAPILLARY: 204 mg/dL — AB (ref 65–99)
GLUCOSE-CAPILLARY: 164 mg/dL — AB (ref 65–99)
Glucose-Capillary: 181 mg/dL — ABNORMAL HIGH (ref 65–99)

## 2016-06-01 LAB — CBC
HEMATOCRIT: 42.9 % (ref 39.0–52.0)
Hemoglobin: 15 g/dL (ref 13.0–17.0)
MCH: 33.6 pg (ref 26.0–34.0)
MCHC: 35 g/dL (ref 30.0–36.0)
MCV: 96 fL (ref 78.0–100.0)
PLATELETS: 237 10*3/uL (ref 150–400)
RBC: 4.47 MIL/uL (ref 4.22–5.81)
RDW: 12.2 % (ref 11.5–15.5)
WBC: 12.9 10*3/uL — AB (ref 4.0–10.5)

## 2016-06-01 LAB — MRSA PCR SCREENING: MRSA by PCR: NEGATIVE

## 2016-06-01 MED ORDER — SPIRONOLACTONE 25 MG PO TABS
50.0000 mg | ORAL_TABLET | Freq: Every day | ORAL | Status: DC
  Administered 2016-06-01 – 2016-06-02 (×2): 50 mg via ORAL
  Filled 2016-06-01 (×2): qty 2

## 2016-06-01 MED ORDER — NICOTINE 21 MG/24HR TD PT24
21.0000 mg | MEDICATED_PATCH | Freq: Every day | TRANSDERMAL | Status: DC
  Administered 2016-06-01: 21 mg via TRANSDERMAL
  Filled 2016-06-01: qty 1

## 2016-06-01 MED ORDER — BENZOCAINE 10 % MT GEL
Freq: Two times a day (BID) | OROMUCOSAL | Status: DC | PRN
Start: 2016-06-01 — End: 2016-06-01

## 2016-06-01 MED ORDER — MORPHINE SULFATE (PF) 2 MG/ML IV SOLN
1.0000 mg | INTRAVENOUS | Status: DC | PRN
  Administered 2016-06-01 – 2016-06-02 (×5): 2 mg via INTRAVENOUS
  Filled 2016-06-01 (×5): qty 1

## 2016-06-01 MED ORDER — ESCITALOPRAM OXALATE 20 MG PO TABS
20.0000 mg | ORAL_TABLET | Freq: Every day | ORAL | Status: DC
  Administered 2016-06-01 (×2): 20 mg via ORAL
  Filled 2016-06-01 (×2): qty 1

## 2016-06-01 MED ORDER — OXYCODONE HCL 5 MG PO TABS
5.0000 mg | ORAL_TABLET | ORAL | Status: DC | PRN
  Administered 2016-06-01 – 2016-06-02 (×4): 10 mg via ORAL
  Filled 2016-06-01 (×4): qty 2

## 2016-06-01 NOTE — Progress Notes (Signed)
PROGRESS NOTE    [name] OleaSteven A [name]  AVW:[id number] DOB: December 09, 1970 DOA: 05/31/2016 PCP: No primary care provider on file.   Brief Narrative: [name] is a 45 y.o. male with medical history significant of HTN, asthma, HIV on HARRT, DM type I, MRSA carrier; who presents with a 2-3 day history of left-sided facial swelling.  Assessment & Plan:   Active Problems:   Human immunodeficiency virus (HIV) disease (HCC)   Anxiety state   Asthma   Facial cellulitis   Essential hypertension   Diabetes mellitus type 2 in obese (HCC)  Facial cellulitis: Improving, on vancomycin. Blood cultures ordered.    HIV on HAART: Resume home meds.    Hypertension: Controlled.   Asthma : Controlled. Stable.    Diabetes Mellitus;  CBG (last 3)   Recent Labs  06/01/16 0800 06/01/16 1145 06/01/16 1747  GLUCAP 181* 204* 164*    Resume SSI.        DVT prophylaxis: (SCD's) Code Status: full code.  Family Communication: none at bedside.  Disposition Plan: home in am.    Consultants:   None.   Procedures: none  Antimicrobials:vancomycin.    Subjective: Reports pain not well controlled.   Objective: Vitals:   06/01/16 0541 06/01/16 0812 06/01/16 1410 06/01/16 1539  BP: (!) 141/81  135/75   Pulse: 78  69   Resp: 18  17   Temp: 98.6 F (37 C)  98.2 F (36.8 C)   TempSrc: Oral  Oral   SpO2: 96% 94% 97% 97%  Weight:      Height:        Intake/Output Summary (Last 24 hours) at 06/01/16 1754 Last data filed at 06/01/16 0530  Gross per 24 hour  Intake             1455 ml  Output              925 ml  Net              530 ml   Filed Weights   05/31/16 1505  Weight: 113.4 kg (250 lb)    Examination:  General exam: Appears calm and comfortable , with facial swelling.  Respiratory system: Clear to auscultation. Respiratory effort normal. Cardiovascular system: S1 & S2 heard, RRR. No JVD, murmurs, rubs, gallops or clicks. No pedal edema. Gastrointestinal  system: Abdomen is nondistended, soft and nontender. No organomegaly or masses felt. Normal bowel sounds heard. Central nervous system: Alert and oriented. No focal neurological deficits. Extremities: Symmetric 5 x 5 power. Skin: No rashes, lesions or ulcers Psychiatry: Judgement and insight appear normal. Mood & affect appropriate.     Data Reviewed: I have personally reviewed following labs and imaging studies  CBC:  Recent Labs Lab 05/31/16 1612 06/01/16 0617  WBC 15.4* 12.9*  NEUTROABS 9.9*  --   HGB 17.4* 15.0  HCT 48.2 42.9  MCV 95.8 96.0  PLT 277 237   Basic Metabolic Panel:  Recent Labs Lab 05/31/16 1612 06/01/16 0617  NA 137 138  K 3.9 3.7  CL 101 105  CO2 27 26  GLUCOSE 282* 168*  BUN 11 8  CREATININE 0.69 0.53*  CALCIUM 9.0 8.3*   GFR: Estimated Creatinine Clearance (by C-G formula based on SCr of 0.53 mg/dL (L)) Male: 829.5123.1 mL/min Male: 149.3 mL/min Liver Function Tests:  Recent Labs Lab 05/31/16 1612  AST 52*  ALT 79*  ALKPHOS 121  BILITOT 0.8  PROT 7.5  ALBUMIN 3.8   No  results for input(s): LIPASE, AMYLASE in the last 168 hours. No results for input(s): AMMONIA in the last 168 hours. Coagulation Profile: No results for input(s): INR, PROTIME in the last 168 hours. Cardiac Enzymes: No results for input(s): CKTOTAL, CKMB, CKMBINDEX, TROPONINI in the last 168 hours. BNP (last 3 results) No results for input(s): PROBNP in the last 8760 hours. HbA1C: No results for input(s): HGBA1C in the last 72 hours. CBG:  Recent Labs Lab 05/31/16 2039 05/31/16 2246 06/01/16 0800 06/01/16 1145 06/01/16 1747  GLUCAP 204* 239* 181* 204* 164*   Lipid Profile: No results for input(s): CHOL, HDL, LDLCALC, TRIG, CHOLHDL, LDLDIRECT in the last 72 hours. Thyroid Function Tests: No results for input(s): TSH, T4TOTAL, FREET4, T3FREE, THYROIDAB in the last 72 hours. Anemia Panel: No results for input(s): VITAMINB12, FOLATE, FERRITIN, TIBC, IRON,  RETICCTPCT in the last 72 hours. Sepsis Labs:  Recent Labs Lab 05/31/16 1653 05/31/16 1931  LATICACIDVEN 2.41* 0.95    Recent Results (from the past 240 hour(s))  Blood culture (routine x 2)     Status: None (Preliminary result)   Collection Time: 05/31/16  4:12 PM  Result Value Ref Range Status   Specimen Description BLOOD RIGHT ANTECUBITAL  Final   Special Requests BOTTLES DRAWN AEROBIC AND ANAEROBIC 5 ML  Final   Culture NO GROWTH < 24 HOURS  Final   Report Status PENDING  Incomplete  Blood culture (routine x 2)     Status: None (Preliminary result)   Collection Time: 05/31/16  4:15 PM  Result Value Ref Range Status   Specimen Description BLOOD LEFT ANTECUBITAL  Final   Special Requests BOTTLES DRAWN AEROBIC AND ANAEROBIC 10 CC  Final   Culture NO GROWTH < 24 HOURS  Final   Report Status PENDING  Incomplete  MRSA PCR Screening     Status: None   Collection Time: 05/31/16 10:20 PM  Result Value Ref Range Status   MRSA by PCR NEGATIVE NEGATIVE Final    Comment:        The GeneXpert MRSA Assay (FDA approved for NASAL specimens only), is one component of a comprehensive MRSA colonization surveillance program. It is not intended to diagnose MRSA infection nor to guide or monitor treatment for MRSA infections.          Radiology Studies: Ct Maxillofacial W Contrast  Result Date: 05/31/2016 CLINICAL DATA:  Severe facial pain EXAM: CT MAXILLOFACIAL WITH CONTRAST TECHNIQUE: Multidetector CT imaging of the maxillofacial structures was performed. Multiplanar CT image reconstructions were also generated. A small metallic BB was placed on the right temple in order to reliably differentiate right from left. COMPARISON:  CT soft tissue neck 05/14/2007 FINDINGS: Osseous: --Complex facial fracture types: No LeFort, zygomaticomaxillary complex or nasoorbitoethmoidal fracture. --Simple fracture types: None. --Mandible: No fracture or dislocation. Orbits: The globes appear intact.  Normal appearance of the intra- and extraconal fat. Symmetric extraocular muscles. Sinuses: Left maxillary retention cyst. No fluid levels or advanced mucosal thickening. Soft tissues: There is inflammatory infiltration of the superficial soft tissues of the upper lip, extending superiorly to the inferior margin of the nose. There is no fluid collection. The left submandibular gland is absent or severely atrophic. There are bilateral presumed reactive cervical lymph nodes, measuring up to 1.4 centimeters at left level IIa. Limited intracranial: Normal. IMPRESSION: 1. Inflammatory findings of the upper lip extending to the inferior nasal margin without abscess or drainable fluid collection. 2. Reactive cervical lymphadenopathy. Electronically Signed   By: Chrisandra NettersKevin  Herman M.D.  On: 05/31/2016 17:51        Scheduled Meds: . efavirenz-emtricitabine-tenofovir  1 tablet Oral QHS  . enoxaparin (LOVENOX) injection  40 mg Subcutaneous Q24H  . escitalopram  20 mg Oral QHS  . insulin aspart  0-5 Units Subcutaneous QHS  . insulin aspart  0-9 Units Subcutaneous TID WC  . mometasone-formoterol  2 puff Inhalation BID  . nicotine  21 mg Transdermal Daily  . pantoprazole  40 mg Oral Daily  . spironolactone  50 mg Oral Daily  . vancomycin  1,000 mg Intravenous Q8H   Continuous Infusions:   LOS: 1 day    Time spent: 35 minutes    Wilberto Console, MD [facility] Pager [phone]  If 7PM-7AM, please contact night-coverage [email or web address] Password [facility] 06/01/2016, 5:54 PM

## 2016-06-01 NOTE — Care Management Note (Signed)
Case Management Note  Patient Details  Name: [name] MRN: [id number] Date of Birth: [birth year]  Subjective/Objective:            Spoke with patient at the bedside. Patient lives at home, independent. [facility] Daiva EvesVan Dam for HIV, is on HART, is compliant. Admitted with facial cellulitis, tried cipro in outpatient setting, IV Vanc.         Action/Plan:  Anticipate DC to home, self care.   Expected Discharge Date:                  Expected Discharge Plan:  Home/Self Care  In-House Referral:     Discharge planning Services  CM Consult  Post Acute Care Choice:    Choice offered to:     DME Arranged:    DME Agency:     HH Arranged:    HH Agency:     Status of Service:  Completed, signed off  If discussed at [employer] Length of Stay Meetings, dates discussed:    Additional Comments:  [name] SabalDebbie Aliyha Fornes, RN 06/01/2016, 12:52 PM

## 2016-06-01 NOTE — Progress Notes (Signed)
Inpatient Diabetes Program Recommendations  AACE/ADA: New Consensus Statement on Inpatient Glycemic Control (2015)  Target Ranges:  Prepandial:   less than 140 mg/dL      Peak postprandial:   less than 180 mg/dL (1-2 hours)      Critically ill patients:  140 - 180 mg/dL   Results for Marguerite OleaHALEY, Jeyren A (MRN [id number]) as of 06/01/2016 12:09  Ref. Range 05/31/2016 20:39 05/31/2016 22:46 06/01/2016 08:00 06/01/2016 11:45  Glucose-Capillary Latest Ref Range: 65 - 99 mg/dL 409204 (H) 811239 (H) 914181 (H) 204 (H)   Review of Glycemic Control  Diabetes history: DM 2 Outpatient Diabetes medications: Metformin 500 mg QHS Current orders for Inpatient glycemic control: Novolog Sensitive TID + HS  Inpatient Diabetes Program Recommendations:  Glucose levels are elevated consider increasing correction to Novolog Moderate TID.   Thanks,  Christena DeemShannon Andy Allende RN, MSN, [facility] Inpatient Diabetes Coordinator Team Pager [phone] (8a-5p)

## 2016-06-01 NOTE — Progress Notes (Signed)
Pt stated she has fallen at home in the past 6 months. She is refusing placement on the bed alarm and would like to remain independent. Pt appears to be steady on her feet at this time. Will continue to assess and monitor.

## 2016-06-01 NOTE — Progress Notes (Signed)
Patient has had a fall within the last 6 months and refuses the bed alarm for safety at this time.

## 2016-06-02 LAB — GLUCOSE, CAPILLARY
Glucose-Capillary: 157 mg/dL — ABNORMAL HIGH (ref 65–99)
Glucose-Capillary: 225 mg/dL — ABNORMAL HIGH (ref 65–99)

## 2016-06-02 MED ORDER — FLUTICASONE-SALMETEROL 100-50 MCG/DOSE IN AEPB: 1.0000 | INHALATION_SPRAY | Freq: Every day | RESPIRATORY_TRACT | Status: DC

## 2016-06-02 MED ORDER — DOXYCYCLINE HYCLATE 50 MG PO CAPS: 100.0000 mg | ORAL_CAPSULE | Freq: Two times a day (BID) | ORAL | 0 refills | Status: DC

## 2016-06-02 MED ORDER — OXYCODONE HCL 5 MG PO TABS: 5.0000 mg | ORAL_TABLET | ORAL | 0 refills | Status: DC | PRN

## 2016-06-02 NOTE — Progress Notes (Signed)
[name] to be D/C'd Home per MD order.  Discussed with the patient and all questions fully answered. Okay to discharge per patient's RN Erin  IV catheter discontinued intact. Site without signs and symptoms of complications. Dressing and pressure applied.  An After Visit Summary was printed and given to the patient. Patient received prescription.  D/c education completed with patient/family including follow up instructions, medication list, d/c activities limitations if indicated, with other d/c instructions as indicated by MD - patient able to verbalize understanding, all questions fully answered.   Patient instructed to return to ED, call 911, or call MD for any changes in condition.     [name], [name] Drollinger C 06/02/2016 2:22 PM

## 2016-06-03 NOTE — Discharge Summary (Signed)
Physician Discharge Summary  [name] ZOX:[id number] DOB: [birth year] DOA: 05/31/2016  PCP: No primary care provider on file.  Admit date: 05/31/2016 Discharge date: 06/02/2016  Admitted From: Home.  Disposition:  Home.   Recommendations for Outpatient Follow-up:  1. Follow up with PCP in 1-2 weeks 2. Please obtain BMP/CBC in one week 3. Please follow up wih ID as recommended.    Discharge Condition:stable.  CODE STATUS:full code.  Diet recommendation: regular diet.   Brief/Interim Summary: [name] SpurlingSteven A Haleyis a 44 y.o.malewith medical history significant of HTN, asthma, HIV on HARRT, DM type I, MRSA carrier; who presents with a 2-3 day history of left-sided facial swelling.  Discharge Diagnoses:  Active Problems:   Human immunodeficiency virus (HIV) disease (HCC)   Anxiety state   Asthma   Facial cellulitis   Essential hypertension   Diabetes mellitus type 2 in obese (HCC)  Facial cellulitis: Improving, on vancomycin. Blood cultures ordered and negative so far.  Discharged her on oral doxycycline to complete the course.    HIV on HAART: Resume home meds.    Hypertension: Controlled.   Asthma : Controlled. Stable.    Diabetes Mellitus;  CBG (last 3)   Recent Labs (last 2 labs)    Recent Labs  06/01/16 0800 06/01/16 1145 06/01/16 1747  GLUCAP 181* 204* 164*     resume metformin on discharge.      Discharge Instructions  Discharge Instructions    Discharge instructions    Complete by:  As directed    Please follow up with PCP in 1 to 2 weeks.     Allergies as of 06/02/2016      Reactions   Propoxyphene N-acetaminophen Other (See Comments)   Stomach cramps      Medication List    STOP taking these medications   azithromycin 250 MG tablet Commonly known as:  ZITHROMAX   chlorhexidine 0.12 % solution Commonly known as:  PERIDEX   clindamycin 150 MG capsule Commonly known as:  CLEOCIN   clindamycin 300 MG  capsule Commonly known as:  CLEOCIN   tobramycin-dexamethasone ophthalmic solution Commonly known as:  TOBRADEX     TAKE these medications   albuterol 108 (90 Base) MCG/ACT inhaler Commonly known as:  PROVENTIL HFA;VENTOLIN HFA Inhale 2 puffs into the lungs every 6 (six) hours as needed for wheezing or shortness of breath.   alprazolam 2 MG tablet Commonly known as:  XANAX TAKE 1 TABLET BY MOUTH TWICE DAILY AS NEEDED   doxycycline 50 MG capsule Commonly known as:  VIBRAMYCIN Take 2 capsules (100 mg total) by mouth 2 (two) times daily.   efavirenz-emtricitabine-tenofovir 600-200-300 MG tablet Commonly known as:  ATRIPLA Take 1 tablet by mouth at bedtime.   escitalopram 20 MG tablet Commonly known as:  LEXAPRO Take 1 tablet (20 mg total) by mouth at bedtime.   Fluticasone-Salmeterol 100-50 MCG/DOSE Aepb Commonly known as:  ADVAIR Inhale 1 puff into the lungs daily.   metFORMIN 500 MG tablet Commonly known as:  GLUCOPHAGE Take 1 tablet (500 mg total) by mouth at bedtime.   morphine 15 MG 12 hr tablet Commonly known as:  MS CONTIN Take 1 tablet (15 mg total) by mouth every 12 (twelve) hours.   omeprazole 20 MG capsule Commonly known as:  PRILOSEC TAKE 2 CAPSULES BY MOUTH EVERY NIGHT AT BEDTIME   oxyCODONE 5 MG immediate release tablet Commonly known as:  Oxy IR/ROXICODONE Take 1-2 tablets (5-10 mg total) by mouth every 4 (four) hours as  needed for moderate pain.   spironolactone 50 MG tablet Commonly known as:  ALDACTONE Take 1 tablet (50 mg total) by mouth daily. What changed:  Another medication with the same name was removed. Continue taking this medication, and follow the directions you see here.      Follow-up Information    Acey Lav, MD. Schedule an appointment as soon as possible for a visit in 2 week(s).   Specialty:  Infectious Diseases Contact information: [address] [place] [address] [phone]          Allergies   Allergen Reactions  . Propoxyphene N-Acetaminophen Other (See Comments)    Stomach cramps    Consultations:  None.    Procedures/Studies: Ct Maxillofacial W Contrast  Result Date: 05/31/2016 CLINICAL DATA:  Severe facial pain EXAM: CT MAXILLOFACIAL WITH CONTRAST TECHNIQUE: Multidetector CT imaging of the maxillofacial structures was performed. Multiplanar CT image reconstructions were also generated. A small metallic BB was placed on the right temple in order to reliably differentiate right from left. COMPARISON:  CT soft tissue neck 05/14/2007 FINDINGS: Osseous: --Complex facial fracture types: No LeFort, zygomaticomaxillary complex or nasoorbitoethmoidal fracture. --Simple fracture types: None. --Mandible: No fracture or dislocation. Orbits: The globes appear intact. Normal appearance of the intra- and extraconal fat. Symmetric extraocular muscles. Sinuses: Left maxillary retention cyst. No fluid levels or advanced mucosal thickening. Soft tissues: There is inflammatory infiltration of the superficial soft tissues of the upper lip, extending superiorly to the inferior margin of the nose. There is no fluid collection. The left submandibular gland is absent or severely atrophic. There are bilateral presumed reactive cervical lymph nodes, measuring up to 1.4 centimeters at left level IIa. Limited intracranial: Normal. IMPRESSION: 1. Inflammatory findings of the upper lip extending to the inferior nasal margin without abscess or drainable fluid collection. 2. Reactive cervical lymphadenopathy. Electronically Signed   By: Deatra Robinson M.D.   On: 05/31/2016 17:51       Subjective: Pain improved.   Discharge Exam: Vitals:   06/01/16 2201 06/02/16 0437  BP: 137/65 (!) 143/77  Pulse: 78 65  Resp: 19 18  Temp: 97.6 F (36.4 C) 97.7 F (36.5 C)   Vitals:   06/01/16 2128 06/01/16 2201 06/02/16 0437 06/02/16 0818  BP:  137/65 (!) 143/77   Pulse: 74 78 65   Resp: 20 19 18    Temp:  97.6  F (36.4 C) 97.7 F (36.5 C)   TempSrc:  Oral Oral   SpO2:  94% 95% 96%  Weight:      Height:        General: Pt is alert, awake, not in acute distress Cardiovascular: RRR, S1/S2 +, no rubs, no gallops Respiratory: CTA bilaterally, no wheezing, no rhonchi Abdominal: Soft, NT, ND, bowel sounds + Extremities: no edema, no cyanosis    The results of significant diagnostics from this hospitalization (including imaging, microbiology, ancillary and laboratory) are listed below for reference.     Microbiology: Recent Results (from the past 240 hour(s))  Blood culture (routine x 2)     Status: None (Preliminary result)   Collection Time: 05/31/16  4:12 PM  Result Value Ref Range Status   Specimen Description BLOOD RIGHT ANTECUBITAL  Final   Special Requests BOTTLES DRAWN AEROBIC AND ANAEROBIC 5 ML  Final   Culture NO GROWTH 2 DAYS  Final   Report Status PENDING  Incomplete  Blood culture (routine x 2)     Status: None (Preliminary result)   Collection  Time: 05/31/16  4:15 PM  Result Value Ref Range Status   Specimen Description BLOOD LEFT ANTECUBITAL  Final   Special Requests BOTTLES DRAWN AEROBIC AND ANAEROBIC 10 CC  Final   Culture NO GROWTH 2 DAYS  Final   Report Status PENDING  Incomplete  MRSA PCR Screening     Status: None   Collection Time: 05/31/16 10:20 PM  Result Value Ref Range Status   MRSA by PCR NEGATIVE NEGATIVE Final    Comment:        The GeneXpert MRSA Assay (FDA approved for NASAL specimens only), is one component of a comprehensive MRSA colonization surveillance program. It is not intended to diagnose MRSA infection nor to guide or monitor treatment for MRSA infections.      Labs: BNP (last 3 results) No results for input(s): BNP in the last 8760 hours. Basic Metabolic Panel:  Recent Labs Lab 05/31/16 1612 06/01/16 0617  NA 137 138  K 3.9 3.7  CL 101 105  CO2 27 26  GLUCOSE 282* 168*  BUN 11 8  CREATININE 0.69 0.53*  CALCIUM 9.0 8.3*    Liver Function Tests:  Recent Labs Lab 05/31/16 1612  AST 52*  ALT 79*  ALKPHOS 121  BILITOT 0.8  PROT 7.5  ALBUMIN 3.8   No results for input(s): LIPASE, AMYLASE in the last 168 hours. No results for input(s): AMMONIA in the last 168 hours. CBC:  Recent Labs Lab 05/31/16 1612 06/01/16 0617  WBC 15.4* 12.9*  NEUTROABS 9.9*  --   HGB 17.4* 15.0  HCT 48.2 42.9  MCV 95.8 96.0  PLT 277 237   Cardiac Enzymes: No results for input(s): CKTOTAL, CKMB, CKMBINDEX, TROPONINI in the last 168 hours. BNP: Invalid input(s): POCBNP CBG:  Recent Labs Lab 06/01/16 0800 06/01/16 1145 06/01/16 1747 06/02/16 0817 06/02/16 1148  GLUCAP 181* 204* 164* 157* 225*   D-Dimer No results for input(s): DDIMER in the last 72 hours. Hgb A1c No results for input(s): HGBA1C in the last 72 hours. Lipid Profile No results for input(s): CHOL, HDL, LDLCALC, TRIG, CHOLHDL, LDLDIRECT in the last 72 hours. Thyroid function studies No results for input(s): TSH, T4TOTAL, T3FREE, THYROIDAB in the last 72 hours.  Invalid input(s): FREET3 Anemia work up No results for input(s): VITAMINB12, FOLATE, FERRITIN, TIBC, IRON, RETICCTPCT in the last 72 hours. Urinalysis    Component Value Date/Time   COLORURINE YELLOW 05/31/2016 2255   APPEARANCEUR CLEAR 05/31/2016 2255   LABSPEC 1.037 (H) 05/31/2016 2255   PHURINE 5.0 05/31/2016 2255   GLUCOSEU >=500 (A) 05/31/2016 2255   GLUCOSEU NEG mg/dL 16/03/9603 5409   HGBUR NEGATIVE 05/31/2016 2255   HGBUR negative 11/04/2008 0900   BILIRUBINUR NEGATIVE 05/31/2016 2255   BILIRUBINUR negative 04/28/2015 1721   KETONESUR NEGATIVE 05/31/2016 2255   PROTEINUR 30 (A) 05/31/2016 2255   UROBILINOGEN 0.2 04/28/2015 1721   UROBILINOGEN 0.2 12/11/2012 1702   NITRITE NEGATIVE 05/31/2016 2255   LEUKOCYTESUR NEGATIVE 05/31/2016 2255   Sepsis Labs Invalid input(s): PROCALCITONIN,  WBC,  LACTICIDVEN Microbiology Recent Results (from the past 240 hour(s))  Blood  culture (routine x 2)     Status: None (Preliminary result)   Collection Time: 05/31/16  4:12 PM  Result Value Ref Range Status   Specimen Description BLOOD RIGHT ANTECUBITAL  Final   Special Requests BOTTLES DRAWN AEROBIC AND ANAEROBIC 5 ML  Final   Culture NO GROWTH 2 DAYS  Final   Report Status PENDING  Incomplete  Blood culture (routine x  2)     Status: None (Preliminary result)   Collection Time: 05/31/16  4:15 PM  Result Value Ref Range Status   Specimen Description BLOOD LEFT ANTECUBITAL  Final   Special Requests BOTTLES DRAWN AEROBIC AND ANAEROBIC 10 CC  Final   Culture NO GROWTH 2 DAYS  Final   Report Status PENDING  Incomplete  MRSA PCR Screening     Status: None   Collection Time: 05/31/16 10:20 PM  Result Value Ref Range Status   MRSA by PCR NEGATIVE NEGATIVE Final    Comment:        The GeneXpert MRSA Assay (FDA approved for NASAL specimens only), is one component of a comprehensive MRSA colonization surveillance program. It is not intended to diagnose MRSA infection nor to guide or monitor treatment for MRSA infections.      Time coordinating discharge: Over 30 minutes  SIGNED:   Kathlen ModyAKULA,Keyonte Cookston, MD  [facility] 06/03/2016, 11:43 AM Pager   If 7PM-7AM, please contact night-coverage [email or web address] Password [email or web address]

## 2016-06-05 LAB — CULTURE, BLOOD (ROUTINE X 2)
CULTURE: NO GROWTH
CULTURE: NO GROWTH

## 2016-06-20 ENCOUNTER — Other Ambulatory Visit (HOSPITAL_COMMUNITY)
Admission: RE | Admit: 2016-06-20 | Discharge: 2016-06-20 | Disposition: A | Discharge status: Home / Self Care | Payer: Medicaid Other | Source: Ambulatory Visit | Attending: Infectious Disease | Admitting: Infectious Disease

## 2016-06-20 ENCOUNTER — Encounter: Payer: Self-pay | Admitting: Infectious Disease

## 2016-06-20 ENCOUNTER — Ambulatory Visit (INDEPENDENT_AMBULATORY_CARE_PROVIDER_SITE_OTHER): Payer: Medicaid Other | Admitting: Infectious Disease

## 2016-06-20 ENCOUNTER — Other Ambulatory Visit: Payer: Self-pay | Admitting: Infectious Disease

## 2016-06-20 VITALS — BP 145/84 | HR 91 | Temp 97.8°F | Wt 262.0 lb

## 2016-06-20 DIAGNOSIS — Z720 Tobacco use: Secondary | ICD-10-CM | POA: Diagnosis not present

## 2016-06-20 DIAGNOSIS — N509 Disorder of male genital organs, unspecified: Secondary | ICD-10-CM | POA: Diagnosis present

## 2016-06-20 DIAGNOSIS — Z113 Encounter for screening for infections with a predominantly sexual mode of transmission: Secondary | ICD-10-CM | POA: Diagnosis not present

## 2016-06-20 DIAGNOSIS — Z789 Other specified health status: Secondary | ICD-10-CM

## 2016-06-20 DIAGNOSIS — F640 Transsexualism: Secondary | ICD-10-CM

## 2016-06-20 DIAGNOSIS — L0292 Furuncle, unspecified: Secondary | ICD-10-CM

## 2016-06-20 DIAGNOSIS — B20 Human immunodeficiency virus [HIV] disease: Secondary | ICD-10-CM

## 2016-06-20 DIAGNOSIS — E1169 Type 2 diabetes mellitus with other specified complication: Secondary | ICD-10-CM | POA: Diagnosis not present

## 2016-06-20 DIAGNOSIS — E1165 Type 2 diabetes mellitus with hyperglycemia: Secondary | ICD-10-CM

## 2016-06-20 DIAGNOSIS — L732 Hidradenitis suppurativa: Secondary | ICD-10-CM | POA: Diagnosis not present

## 2016-06-20 DIAGNOSIS — F321 Major depressive disorder, single episode, moderate: Secondary | ICD-10-CM | POA: Diagnosis not present

## 2016-06-20 DIAGNOSIS — E669 Obesity, unspecified: Secondary | ICD-10-CM

## 2016-06-20 DIAGNOSIS — N5089 Other specified disorders of the male genital organs: Secondary | ICD-10-CM

## 2016-06-20 HISTORY — DX: Other specified disorders of the male genital organs: N50.89

## 2016-06-20 MED ORDER — EMTRICITABINE-TENOFOVIR AF 200-25 MG PO TABS: 1.0000 | ORAL_TABLET | Freq: Every day | ORAL | 5 refills | Status: DC

## 2016-06-20 MED ORDER — METFORMIN HCL 500 MG PO TABS: 500.0000 mg | ORAL_TABLET | Freq: Every day | ORAL | 11 refills | Status: DC

## 2016-06-20 MED ORDER — SPIRONOLACTONE 50 MG PO TABS: 50.0000 mg | ORAL_TABLET | Freq: Every day | ORAL | 5 refills | Status: DC

## 2016-06-20 MED ORDER — ESCITALOPRAM OXALATE 20 MG PO TABS: 20.0000 mg | ORAL_TABLET | Freq: Every day | ORAL | 5 refills | Status: DC

## 2016-06-20 MED ORDER — DOLUTEGRAVIR SODIUM 50 MG PO TABS: 50.0000 mg | ORAL_TABLET | Freq: Every day | ORAL | 5 refills | Status: DC

## 2016-06-20 MED FILL — SPIRONOLACTONE 50 MG TABLET: 50 | 30 days supply | Qty: 30 | Fill #0

## 2016-06-20 MED FILL — TIVICAY 50 MG TABLET: 50 | 30 days supply | Qty: 30 | Fill #0

## 2016-06-20 MED FILL — metFORMIN HCL 500 MG TABS: 500 | 30 days supply | Qty: 30 | Fill #0

## 2016-06-20 MED FILL — ESCITALOPRAM 20 MG TABLET: 20 | 30 days supply | Qty: 30 | Fill #0

## 2016-06-20 MED FILL — DESCOVY 200-25 MG TABS: 200-25 | 30 days supply | Qty: 30 | Fill #0

## 2016-06-20 NOTE — Progress Notes (Signed)
Chief complaints: Boils on right and left buttocks, mass on left scrotum, recent development of facial abscess.  Subjective:    Patient ID: [name], male    DOB: [birth year], 46 y.o.   MRN: [id number]  HPI  46  year old transgender male, [name] who has typically been highly compliant with ARVs (Atripla)   She has diabetes mellitus type 2 which is not very well controlled partly due to the fact she has not seen in clinic very frequently. She is only on 500 mg of metformin.  She actually came in and was interested in a different antiretroviral regimen today and we had an extensive discussion of different treatment options for her.  She also has been suffering from recent infections including a facial abscess that was likely due to methicillin-resistant staph aureus and responded to vancomycin intravenously.  She has had 2 other areas that have come up on her left buttocks and her right buttocks. On the left has some drainage to it more medially to where the lesion is.  She continues to take her spironolactone for her to antiandrogen effects and to help with the hidradenitis.  She has also noticed a lump on her left testicle that is tender to palpation.  She says that she never has any ejaculations because "I have no use for that thing " and wants to have her testicles and penis removed surgically.  Lab Results  Component Value Date   HIV1RNAQUANT 24 (H) 07/24/2013   HIV1RNAQUANT 21 02/07/2013   HIV1RNAQUANT <20 08/16/2012   Lab Results  Component Value Date   CD4TABS 2,465 04/26/2016   CD4TABS 2,213 04/27/2015   CD4TABS 2,369 09/02/2014     Past Medical History:  Diagnosis Date  . Diabetes mellitus   . HIV disease (HCC)   . Hypertension   . MRSA carrier   . Polyuria 04/27/2015  . Poorly controlled diabetes mellitus (HCC) 04/27/2015  . Transgender 04/27/2015    Past Surgical History:  Procedure Laterality Date  . none      No family history on file.    Social History   Social History  . Marital status: Single    Spouse name: N/A  . Number of children: N/A  . Years of education: N/A   Social History Main Topics  . Smoking status: Current Every Day Smoker    Packs/day: 1.00    Years: 27.00    Types: Cigarettes  . Smokeless tobacco: Never Used     Comment: nicotene patch  . Alcohol use No  . Drug use:     Types: Marijuana  . Sexual activity: Yes    Partners: Male     Comment: pt. given condoms   Other Topics Concern  . None   Social History Narrative  . None    Allergies  Allergen Reactions  . Propoxyphene N-Acetaminophen Other (See Comments)    Stomach cramps     Current Outpatient Prescriptions:  .  albuterol (PROVENTIL HFA;VENTOLIN HFA) 108 (90 BASE) MCG/ACT inhaler, Inhale 2 puffs into the lungs every 6 (six) hours as needed for wheezing or shortness of breath., Disp: 1 Inhaler, Rfl: 6 .  alprazolam (XANAX) 2 MG tablet, TAKE 1 TABLET BY MOUTH TWICE DAILY AS NEEDED, Disp: 60 tablet, Rfl: 1 .  efavirenz-emtricitabine-tenofovir (ATRIPLA) 600-200-300 MG tablet, Take 1 tablet by mouth at bedtime., Disp: 30 tablet, Rfl: 0 .  escitalopram (LEXAPRO) 20 MG tablet, Take 1 tablet (20 mg total) by mouth at bedtime., Disp: 30  tablet, Rfl: 0 .  Fluticasone-Salmeterol (ADVAIR) 100-50 MCG/DOSE AEPB, Inhale 1 puff into the lungs daily., Disp: 60 each, Rfl:  .  metFORMIN (GLUCOPHAGE) 500 MG tablet, Take 1 tablet (500 mg total) by mouth at bedtime., Disp: 30 tablet, Rfl: 11 .  morphine (MS CONTIN) 15 MG 12 hr tablet, Take 1 tablet (15 mg total) by mouth every 12 (twelve) hours., Disp: 60 tablet, Rfl: 0 .  omeprazole (PRILOSEC) 20 MG capsule, TAKE 2 CAPSULES BY MOUTH EVERY NIGHT AT BEDTIME, Disp: 60 capsule, Rfl: 2 .  oxyCODONE (OXY IR/ROXICODONE) 5 MG immediate release tablet, Take 1-2 tablets (5-10 mg total) by mouth every 4 (four) hours as needed for moderate pain., Disp: 10 tablet, Rfl: 0 .  spironolactone (ALDACTONE) 50 MG tablet,  Take 1 tablet (50 mg total) by mouth daily., Disp: 30 tablet, Rfl: 0   Review of Systems  Constitutional: Negative for activity change, appetite change, chills, diaphoresis, fatigue, fever and unexpected weight change.  HENT: Negative for congestion, rhinorrhea, sinus pressure, sneezing, sore throat and trouble swallowing.   Eyes: Negative for photophobia and visual disturbance.  Respiratory: Negative for cough, chest tightness, shortness of breath, wheezing and stridor.   Cardiovascular: Negative for chest pain, palpitations and leg swelling.  Gastrointestinal: Negative for abdominal distention, abdominal pain, anal bleeding, blood in stool, constipation, diarrhea, nausea and vomiting.  Genitourinary: Positive for frequency and testicular pain. Negative for difficulty urinating, dysuria, flank pain and hematuria.  Musculoskeletal: Negative for arthralgias, back pain, gait problem, joint swelling and myalgias.  Skin: Positive for color change. Negative for pallor, rash and wound.  Neurological: Negative for dizziness, tremors, weakness and light-headedness.  Hematological: Negative for adenopathy. Does not bruise/bleed easily.  Psychiatric/Behavioral: Negative for agitation, behavioral problems, confusion, decreased concentration and sleep disturbance.       Objective:   Physical Exam  Constitutional: He is oriented to person, place, and time. He appears well-developed and well-nourished.  HENT:  Head: Normocephalic and atraumatic.  Eyes: Conjunctivae and EOM are normal.  Neck: Normal range of motion. Neck supple.  Cardiovascular: Normal rate and regular rhythm.   Pulmonary/Chest: Effort normal. No respiratory distress. He has no wheezes.  Abdominal: Soft. He exhibits no distension.  Musculoskeletal: Normal range of motion. He exhibits no edema or tenderness.  Neurological: He is alert and oriented to person, place, and time.  Skin: Skin is warm and dry. No rash noted. No erythema. No  pallor.  Psychiatric: He has a normal mood and affect. Thought content normal. Cognition and memory are normal.   Left buttocks 06/21/15:        Left testicule with palpable mass on left testes tender         Assessment & Plan:   HIV : considering changing to Tivicay and Descovy with transition to B-F-TAF when available, rtc for visit with Cassie in 2 weeks and Absent at time.    DM: poorly controlled since not on metformin, TIVICAY will increase the levels and she also needs to increase the dose herself to twice a day.  HTN: rewrote rx for aldactone  Transgender: aldactone rx written, she wants to go through surgical transition and that could potentially be done I would like for her to stop smoking though B1 benefit that would allow me to potentially give her hormonal therapy as well without risk for cardiovascular problems  MRSA infections in the context of hidradenitis: Continue warm compresses to areas and antibiotics as needed  Testicular lesion: I have ordered all shunt  of the scrotum.  We spent greater than 40 minutes with the patient including greater than 50% of time in face to face counsel of the patient with re to her HIV, DM, HTN, Transgender, MRSA infections, hidradenitis testicular lesion and in coordination of her care.

## 2016-06-20 NOTE — Progress Notes (Signed)
HPI: [name] is a 46 y.o. transgender male who presents to the [facility] [facility] clinic today for follow-up of her HIV infection. She is currently on Atripla and wants to change to a different regimen.  Allergies: Allergies  Allergen Reactions  . Propoxyphene N-Acetaminophen Other (See Comments)    Stomach cramps    Past Medical History: Past Medical History:  Diagnosis Date  . Diabetes mellitus   . HIV disease ([place])   . Hypertension   . MRSA carrier   . Polyuria 04/27/2015  . Poorly controlled diabetes mellitus ([place]) 04/27/2015  . Transgender 04/27/2015    Social History: Social History   Social History  . Marital status: Single    Spouse name: N/A  . Number of children: N/A  . Years of education: N/A   Social History Main Topics  . Smoking status: Current Every Day Smoker    Packs/day: 1.00    Years: 27.00    Types: Cigarettes  . Smokeless tobacco: Never Used     Comment: nicotene patch  . Alcohol use No  . Drug use:     Types: Marijuana  . Sexual activity: Yes    Partners: Male     Comment: pt. given condoms   Other Topics Concern  . None   Social History Narrative  . None    Current Regimen: Atripla  Labs: HIV 1 RNA Quant (copies/mL)  Date Value  07/24/2013 24 (H)  02/07/2013 21  08/16/2012 <20   HIV-1 RNA Viral Load (no units)  Date Value  04/26/2016 <40  11/18/2015 <40  04/27/2015 58   CD4 (no units)  Date Value  04/26/2016 2,465  04/27/2015 2,213  09/02/2014 2,369   Hep B S Ab (no units)  Date Value  04/26/2016 NEG   Hepatitis B Surface Ag (no units)  Date Value  04/26/2016 NEGATIVE   HCV Ab (no units)  Date Value  04/26/2016 NEGATIVE    CrCl: Estimated Creatinine Clearance: 151.3 mL/min (by C-G formula based on SCr of 0.53 mg/dL (L)).  Lipids:    Component Value Date/Time   CHOL 198 04/26/2016 1115   TRIG 300 (H) 04/26/2016 1115   HDL 37 (L) 04/26/2016 1115   CHOLHDL 5.4 (H) 04/26/2016 1115   VLDL 60 (H) 04/26/2016  1115   LDLCALC 101 (H) 04/26/2016 1115    Assessment: [name] is here today for follow-up of her HIV.  She has been on Atripla for quite some time.  She is interested in changing after explanation of the side effects of Atripla.  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 She was interested in Triumeq but we do not have a HLA on her. We decided on Tivicay + Descovy with potential switch to [facility] containing once daily regimen when it comes out in February. We will send all of her Rx to [facility] to fill and mail to her sister's house.  She will f/u with me in 2-3 weeks.  Plans: - Stop Atripla - Start Descovy 200-25 mg PO daily - Start Tivicay 50 mg PO daily - Send all Rx to [facility] (metformin, tivicay, descovy, spironolactone, lexapro) - F/u with me 1/24 at 11:30am  [name], PharmD Infectious Diseases [facility] 06/20/2016, 3:25 PM

## 2016-06-21 LAB — RPR

## 2016-06-22 LAB — CYTOLOGY, (ORAL, ANAL, URETHRAL) ANCILLARY ONLY
CHLAMYDIA, DNA PROBE: NEGATIVE
Chlamydia: NEGATIVE
Neisseria Gonorrhea: NEGATIVE
Neisseria Gonorrhea: NEGATIVE

## 2016-06-28 ENCOUNTER — Other Ambulatory Visit: Payer: Medicaid Other

## 2016-06-30 ENCOUNTER — Other Ambulatory Visit: Payer: Medicaid Other

## 2016-07-06 ENCOUNTER — Ambulatory Visit: Payer: Medicaid Other

## 2016-07-11 ENCOUNTER — Telehealth: Payer: Self-pay | Admitting: Pharmacist
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
## 2016-07-26 ENCOUNTER — Ambulatory Visit (INDEPENDENT_AMBULATORY_CARE_PROVIDER_SITE_OTHER): Payer: Medicaid Other | Admitting: Pharmacist Clinician (PhC)/ Clinical Pharmacy Specialist

## 2016-07-26 DIAGNOSIS — B20 Human immunodeficiency virus [HIV] disease: Secondary | ICD-10-CM

## 2016-07-26 MED FILL — ESCITALOPRAM 20 MG TABLET: 20 | 30 days supply | Qty: 30 | Fill #1

## 2016-07-26 MED FILL — TIVICAY 50 MG TABLET: 50 | 30 days supply | Qty: 30 | Fill #1

## 2016-07-26 MED FILL — DESCOVY 200-25 MG TABS: 200-25 | 30 days supply | Qty: 30 | Fill #1

## 2016-07-26 MED FILL — metFORMIN HCL 500 MG TABS: 500 | 30 days supply | Qty: 30 | Fill #1

## 2016-07-26 MED FILL — SPIRONOLACTONE 50 MG TABLET: 50 | 30 days supply | Qty: 30 | Fill #1

## 2016-07-26 NOTE — Progress Notes (Addendum)
HPI: [name] is a 46 y.o. male who is here for her HIV visit with [name].   Allergies: Allergies  Allergen Reactions  . Propoxyphene N-Acetaminophen Other (See Comments)    Stomach cramps    Vitals:    Past Medical History: Past Medical History:  Diagnosis Date  . Diabetes mellitus   . HIV disease (HCC)   . Hypertension   . MRSA carrier   . Polyuria 04/27/2015  . Poorly controlled diabetes mellitus (HCC) 04/27/2015  . Testicular mass 06/20/2016  . Transgender 04/27/2015    Social History: Social History   Social History  . Marital status: Single    Spouse name: N/A  . Number of children: N/A  . Years of education: N/A   Social History Main Topics  . Smoking status: Current Every Day Smoker    Packs/day: 1.00    Years: 27.00    Types: Cigarettes  . Smokeless tobacco: Never Used     Comment: nicotene patch  . Alcohol use No  . Drug use: Yes    Types: Marijuana  . Sexual activity: Yes    Partners: Male     Comment: pt. given condoms   Other Topics Concern  . Not on file   Social History Narrative  . No narrative on file    Previous Regimen: ATP  Current Regimen: DTV/Descovy  Labs: HIV 1 RNA Quant (copies/mL)  Date Value  07/24/2013 24 (H)  02/07/2013 21  08/16/2012 <20   HIV-1 RNA Viral Load (no units)  Date Value  04/26/2016 <40  11/18/2015 <40  04/27/2015 58   CD4 (no units)  Date Value  04/26/2016 2,465  04/27/2015 2,213  09/02/2014 2,369   Hep B S Ab (no units)  Date Value  04/26/2016 NEG   Hepatitis B Surface Ag (no units)  Date Value  04/26/2016 NEGATIVE   HCV Ab (no units)  Date Value  04/26/2016 NEGATIVE    CrCl: CrCl cannot be calculated (Patient's most recent lab result is older than the maximum 21 days allowed.).  Lipids:    Component Value Date/Time   CHOL 198 04/26/2016 1115   TRIG 300 (H) 04/26/2016 1115   HDL 37 (L) 04/26/2016 1115   CHOLHDL 5.4 (H) 04/26/2016 1115   VLDL 60 (H) 04/26/2016 1115    LDLCALC 101 (H) 04/26/2016 1115    Assessment: [name] was switched to the current regimen a month ago. She is here for a visit to see how she is doing. She took her last dose of meds yesterday. She is going to pick up the new supply today at [name]. She is currently in the observational study here in the [facility]. There is not study meds involved. We are going to do labs on her today. She asked about her STD results from the last visit. I told her that they were all neg and she was very relieve.   Recommendations:  Cont DTG/Descovy HIV VL today F/u with Dr Daiva EvesVan Dam in June  Ulyses SouthwardMinh Abbigael Detlefsen, PharmD, BCPS, AAHIVP, CPP Clinical Infectious Disease Pharmacist [facility] 07/26/2016, 2:44 PM

## 2016-07-26 NOTE — Patient Instructions (Signed)
Continue your Tivicay and Descovy We will get labs today

## 2016-07-31 LAB — HIV-1 RNA QUANT-NO REFLEX-BLD
HIV 1 RNA Quant: 110 copies/mL — ABNORMAL HIGH
HIV-1 RNA Quant, Log: 2.04 Log copies/mL — ABNORMAL HIGH

## 2016-08-24 MED FILL — DESCOVY 200-25 MG TABS: 200-25 | 30 days supply | Qty: 30 | Fill #2

## 2016-08-24 MED FILL — metFORMIN HCL 500 MG TABS: 500 | 30 days supply | Qty: 30 | Fill #2

## 2016-08-24 MED FILL — ESCITALOPRAM 20 MG TABLET: 20 | 30 days supply | Qty: 30 | Fill #2

## 2016-08-24 MED FILL — TIVICAY 50 MG TABLET: 50 | 30 days supply | Qty: 30 | Fill #2

## 2016-08-24 MED FILL — SPIRONOLACTONE 50 MG TABLET: 50 | 30 days supply | Qty: 30 | Fill #2

## 2016-09-30 ENCOUNTER — Encounter: Payer: Self-pay | Admitting: Licensed Clinical Social Worker

## 2016-10-06 MED FILL — SPIRONOLACTONE 50 MG TABLET: 50 | 30 days supply | Qty: 30 | Fill #3

## 2016-10-06 MED FILL — DESCOVY 200-25 MG TABS: 200-25 | 30 days supply | Qty: 30 | Fill #3

## 2016-10-06 MED FILL — ESCITALOPRAM 20 MG TABLET: 20 | 30 days supply | Qty: 30 | Fill #3

## 2016-10-06 MED FILL — metFORMIN HCL 500 MG TABS: 500 | 30 days supply | Qty: 30 | Fill #3

## 2016-10-06 MED FILL — TIVICAY 50 MG TABLET: 50 | 30 days supply | Qty: 30 | Fill #3

## 2016-11-09 MED FILL — DESCOVY 200-25 MG TABS: 200-25 | 30 days supply | Qty: 30 | Fill #4

## 2016-11-09 MED FILL — SPIRONOLACTONE 50 MG TABLET: 50 | 30 days supply | Qty: 30 | Fill #4

## 2016-11-09 MED FILL — metFORMIN HCL 500 MG TABS: 500 | 30 days supply | Qty: 30 | Fill #4

## 2016-11-09 MED FILL — ESCITALOPRAM 20 MG TABLET: 20 | 30 days supply | Qty: 30 | Fill #4

## 2016-11-09 MED FILL — TIVICAY 50 MG TABLET: 50 | 30 days supply | Qty: 30 | Fill #4

## 2016-11-30 ENCOUNTER — Ambulatory Visit: Payer: Medicaid Other | Admitting: Infectious Disease

## 2016-12-06 ENCOUNTER — Other Ambulatory Visit: Payer: Self-pay | Admitting: Pharmacist

## 2016-12-06 DIAGNOSIS — K219 Gastro-esophageal reflux disease without esophagitis: Secondary | ICD-10-CM

## 2016-12-06 MED ORDER — OMEPRAZOLE 20 MG PO CPDR: 40.0000 mg | DELAYED_RELEASE_CAPSULE | Freq: Every day | ORAL | 5 refills | Status: DC

## 2016-12-06 MED FILL — SPIRONOLACTONE 50 MG TAB: 50 | 30 days supply | Qty: 30 | Fill #5

## 2016-12-06 MED FILL — DESCOVY 200-25 MG TABS: 200-25 | 30 days supply | Qty: 30 | Fill #5

## 2016-12-06 MED FILL — ESCITALOPRAM 20 MG TABLET: 20 | 30 days supply | Qty: 30 | Fill #5

## 2016-12-06 MED FILL — OMEPRAZOLE 20 MG CAPSULE DR: 20 | 30 days supply | Qty: 60 | Fill #0

## 2016-12-06 MED FILL — TIVICAY 50 MG TABLET: 50 | 30 days supply | Qty: 30 | Fill #5

## 2016-12-06 MED FILL — metFORMIN HCL 500 MG TABS: 500 | 30 days supply | Qty: 30 | Fill #5

## 2016-12-30 ENCOUNTER — Other Ambulatory Visit: Payer: Self-pay | Admitting: Infectious Disease

## 2016-12-30 DIAGNOSIS — F321 Major depressive disorder, single episode, moderate: Secondary | ICD-10-CM

## 2016-12-30 DIAGNOSIS — B20 Human immunodeficiency virus [HIV] disease: Secondary | ICD-10-CM

## 2016-12-30 MED FILL — SPIRONOLACTONE 50 MG TAB: 50 | 30 days supply | Qty: 30 | Fill #0

## 2016-12-30 MED FILL — ESCITALOPRAM 20 MG TABLET: 20 | 30 days supply | Qty: 30 | Fill #0

## 2017-01-03 MED FILL — metFORMIN HCL 500 MG TABS: 500 | 30 days supply | Qty: 30 | Fill #6

## 2017-01-03 MED FILL — DESCOVY 200-25 MG TABS: 200-25 | 30 days supply | Qty: 30 | Fill #0

## 2017-01-03 MED FILL — TIVICAY 50 MG TABLET: 50 | 30 days supply | Qty: 30 | Fill #0

## 2017-01-03 MED FILL — OMEPRAZOLE 20 MG CAP: 20 | 30 days supply | Qty: 60 | Fill #1

## 2017-01-04 ENCOUNTER — Encounter: Payer: Medicaid Other | Admitting: *Deleted

## 2017-02-14 MED FILL — ESCITALOPRAM 20 MG TABLET: 20 | 30 days supply | Qty: 30 | Fill #1

## 2017-02-14 MED FILL — DESCOVY 200-25 MG TABS: 200-25 | 30 days supply | Qty: 30 | Fill #1

## 2017-02-14 MED FILL — SPIRONOLACTONE 50 MG TABLET: 50 | 30 days supply | Qty: 30 | Fill #1

## 2017-02-14 MED FILL — OMEPRAZOLE 20 MG CAP: 20 | 30 days supply | Qty: 60 | Fill #2

## 2017-02-14 MED FILL — metFORMIN HCL 500 MG TABS: 500 | 30 days supply | Qty: 30 | Fill #7

## 2017-02-14 MED FILL — TIVICAY 50 MG TABLET: 50 | 30 days supply | Qty: 30 | Fill #1

## 2017-02-15 ENCOUNTER — Ambulatory Visit: Payer: Medicaid Other | Admitting: Infectious Disease

## 2017-02-24 ENCOUNTER — Other Ambulatory Visit: Payer: Self-pay | Admitting: Pharmacist

## 2017-03-08 MED FILL — DESCOVY 200-25 MG TABS: 200-25 | 30 days supply | Qty: 30 | Fill #2

## 2017-03-08 MED FILL — TIVICAY 50 MG TABLET: 50 | 30 days supply | Qty: 30 | Fill #2

## 2017-03-08 MED FILL — SPIRONOLACTONE 50 MG TABLET: 50 | 30 days supply | Qty: 30 | Fill #2

## 2017-03-08 MED FILL — OMEPRAZOLE 20 MG CAP: 20 | 30 days supply | Qty: 60 | Fill #3

## 2017-03-08 MED FILL — metFORMIN HCL 500 MG TABS: 500 | 30 days supply | Qty: 30 | Fill #8

## 2017-03-08 MED FILL — ESCITALOPRAM 20 MG TABLET: 20 | 30 days supply | Qty: 30 | Fill #2

## 2017-03-27 ENCOUNTER — Encounter (INDEPENDENT_AMBULATORY_CARE_PROVIDER_SITE_OTHER): Payer: Medicaid Other | Admitting: *Deleted

## 2017-03-27 VITALS — BP 137/93 | HR 86 | Temp 98.1°F | Wt 254.5 lb

## 2017-03-27 DIAGNOSIS — Z006 Encounter for examination for normal comparison and control in clinical research program: Secondary | ICD-10-CM

## 2017-03-27 NOTE — Progress Notes (Signed)
[name] is here for week 240 visit [id number] HAILO Study: A Long Term follow-up of Older HIV-Infected Adults in the ACTG, an observational study addressing the issues of aging, HIV infection and Inflammation. States that she has noticed problems with her balance along with numbness and burning/aching sensation in her (R) foot. She also feels that she is having some difficulty with her memory as well. States that this all started about 6-7 months ago.  Continues to have problems with recurrent lesions to buttocks. Has appointment with Dr. [name] on 03/30/17. Will return in March for next study visit.

## 2017-03-28 LAB — HEMOGLOBIN A1C
EAG (MMOL/L): 16.3 (calc)
Hgb A1c MFr Bld: 11.9 % of total Hgb — ABNORMAL HIGH (ref <5.7)
Mean Plasma Glucose: 295 (calc)

## 2017-03-28 LAB — COMPREHENSIVE METABOLIC PANEL
AG RATIO: 1.7 (calc) (ref 1.0–2.5)
ALT: 30 U/L (ref 9–46)
AST: 16 U/L (ref 10–40)
Albumin: 4.5 g/dL (ref 3.6–5.1)
Alkaline phosphatase (APISO): 99 U/L (ref 40–115)
BUN: 12 mg/dL (ref 7–25)
CHLORIDE: 98 mmol/L (ref 98–110)
CO2: 28 mmol/L (ref 20–32)
Calcium: 9.4 mg/dL (ref 8.6–10.3)
Creat: 0.76 mg/dL (ref 0.60–1.35)
GLUCOSE: 339 mg/dL — AB (ref 65–99)
Globulin: 2.7 g/dL (calc) (ref 1.9–3.7)
POTASSIUM: 4.3 mmol/L (ref 3.5–5.3)
Sodium: 135 mmol/L (ref 135–146)
Total Bilirubin: 0.4 mg/dL (ref 0.2–1.2)
Total Protein: 7.2 g/dL (ref 6.1–8.1)

## 2017-03-28 LAB — CD4/CD8 (T-HELPER/T-SUPPRESSOR CELL)
CD4 % Helper T Cell: 49.1
CD4 COUNT: 1964
CD8 T CELL ABS: 1040
CD8 T CELL SUPPRESSOR: 26

## 2017-03-28 LAB — LIPID PANEL
CHOL/HDL RATIO: 5.3 (calc) — AB (ref <5.0)
Cholesterol: 190 mg/dL (ref <200)
HDL: 36 mg/dL — AB (ref >40)
LDL Cholesterol (Calc): 115 mg/dL (calc) — ABNORMAL HIGH
NON-HDL CHOLESTEROL (CALC): 154 mg/dL — AB (ref <130)
Triglycerides: 273 mg/dL — ABNORMAL HIGH (ref <150)

## 2017-03-28 LAB — PROTEIN / CREATININE RATIO, URINE
Creatinine, Urine: 54 mg/dL (ref 20–320)
PROTEIN/CREAT RATIO: 259 mg/g{creat} — AB (ref 22–128)
TOTAL PROTEIN, URINE: 14 mg/dL (ref 5–25)

## 2017-03-30 ENCOUNTER — Encounter: Payer: Self-pay | Admitting: Infectious Disease

## 2017-03-30 ENCOUNTER — Ambulatory Visit (INDEPENDENT_AMBULATORY_CARE_PROVIDER_SITE_OTHER): Payer: Medicaid Other | Admitting: Infectious Disease

## 2017-03-30 ENCOUNTER — Other Ambulatory Visit: Payer: Self-pay | Admitting: Infectious Disease

## 2017-03-30 VITALS — BP 134/90 | HR 90 | Temp 98.2°F | Wt 250.0 lb

## 2017-03-30 DIAGNOSIS — Z789 Other specified health status: Secondary | ICD-10-CM

## 2017-03-30 DIAGNOSIS — F640 Transsexualism: Secondary | ICD-10-CM | POA: Diagnosis not present

## 2017-03-30 DIAGNOSIS — Z23 Encounter for immunization: Secondary | ICD-10-CM | POA: Diagnosis not present

## 2017-03-30 DIAGNOSIS — E1165 Type 2 diabetes mellitus with hyperglycemia: Secondary | ICD-10-CM | POA: Diagnosis not present

## 2017-03-30 DIAGNOSIS — B20 Human immunodeficiency virus [HIV] disease: Secondary | ICD-10-CM

## 2017-03-30 DIAGNOSIS — K611 Rectal abscess: Secondary | ICD-10-CM | POA: Diagnosis not present

## 2017-03-30 HISTORY — DX: Rectal abscess: K61.1

## 2017-03-30 MED ORDER — BICTEGRAVIR-EMTRICITAB-TENOFOV 50-200-25 MG PO TABS: 1.0000 | ORAL_TABLET | Freq: Every day | ORAL | 11 refills | Status: DC

## 2017-03-30 MED ORDER — METFORMIN HCL 500 MG PO TABS: 1000.0000 mg | ORAL_TABLET | Freq: Two times a day (BID) | ORAL | 11 refills | Status: DC

## 2017-03-30 NOTE — Progress Notes (Signed)
Chief complaints: a new boil on right buttocks and concern for abscess underneath this  Subjective:    Patient ID: [name], male    DOB: [birth year], 46 y.o.   MRN: [id number]  HPI  46  year old transgender male, [name] BumpsJessica who has typically been highly compliant with ARVs (Atripla) --> Tivicay and Descovy though last  VL was 200, she had labs drawn with research in past few days and they are not yet back  She has several issues that we needed to address today.  #1 Her DM is poorly contrrolled with A1C 11.0. She is taking only metformin 500mg  daily and NOT with a PCP.  I doubt very much that her uncontrolled Diabetes mellitus that is now causing neuropathic pain is going to be something that can be controlled with oral meds and I thinks she needs to be established with PCP and or endocrine with likely initaiton of insluin, eye exams, foot exams etc.  #2 She has noticed new area on the right buttocks that is fluctuant and tender. This came up in past 4 weeks and is progressively becoming more painful. She had tried warm compresses which did not help.  She denies fevers, nausea or other systemic symptoms.  She has noticed that if she pushes int he center that is the most painful she can "push her way into a hole" and that when she does this the pain is worse and that she notices antoher area nearby become swollen and simliarly if she pushes on that leson it increased the lesion that is the most painful    Lab Results  Component Value Date   HIV1RNAQUANT 110 (H) 07/26/2016   HIV1RNAQUANT 24 (H) 07/24/2013   HIV1RNAQUANT 21 02/07/2013   Lab Results  Component Value Date   CD4TABS 2,465 04/26/2016   CD4TABS 2,213 04/27/2015   CD4TABS 2,369 09/02/2014     Past Medical History:  Diagnosis Date  . Diabetes mellitus   . HIV disease (HCC)   . Hypertension   . MRSA carrier   . Polyuria 04/27/2015  . Poorly controlled diabetes mellitus (HCC) 04/27/2015  . Testicular mass  06/20/2016  . Transgender 04/27/2015    Past Surgical History:  Procedure Laterality Date  . none      No family history on file.    Social History   Social History  . Marital status: Single    Spouse name: N/A  . Number of children: N/A  . Years of education: N/A   Social History Main Topics  . Smoking status: Current Every Day Smoker    Packs/day: 1.00    Years: 27.00    Types: Cigarettes  . Smokeless tobacco: Never Used  . Alcohol use No  . Drug use: Yes    Types: Marijuana  . Sexual activity: Yes    Partners: Male     Comment: pt. given condoms   Other Topics Concern  . None   Social History Narrative  . None    Allergies  Allergen Reactions  . Propoxyphene N-Acetaminophen Other (See Comments)    Stomach cramps     Current Outpatient Prescriptions:  .  albuterol (PROVENTIL HFA;VENTOLIN HFA) 108 (90 BASE) MCG/ACT inhaler, Inhale 2 puffs into the lungs every 6 (six) hours as needed for wheezing or shortness of breath., Disp: 1 Inhaler, Rfl: 6 .  DESCOVY 200-25 MG tablet, TAKE 1 TABLET BY MOUTH DAILY., Disp: 30 tablet, Rfl: 5 .  escitalopram (LEXAPRO) 20 MG tablet, TAKE 1  TABLET BY MOUTH AT BEDTIME, Disp: 30 tablet, Rfl: 5 .  metFORMIN (GLUCOPHAGE) 500 MG tablet, Take 1 tablet (500 mg total) by mouth at bedtime., Disp: 30 tablet, Rfl: 11 .  omeprazole (PRILOSEC) 20 MG capsule, Take 2 capsules (40 mg total) by mouth at bedtime., Disp: 60 capsule, Rfl: 5 .  spironolactone (ALDACTONE) 50 MG tablet, TAKE 1 TABLET BY MOUTH ONCE DAILY, Disp: 30 tablet, Rfl: 5 .  TIVICAY 50 MG tablet, TAKE 1 TABLET BY MOUTH ONCE DAILY, Disp: 30 tablet, Rfl: 5   Review of Systems  Constitutional: Positive for fatigue. Negative for activity change, appetite change, chills, diaphoresis, fever and unexpected weight change.  HENT: Negative for congestion, rhinorrhea, sinus pressure, sneezing, sore throat and trouble swallowing.   Eyes: Negative for photophobia and visual disturbance.    Respiratory: Negative for cough, chest tightness, shortness of breath, wheezing and stridor.   Cardiovascular: Negative for chest pain, palpitations and leg swelling.  Gastrointestinal: Positive for rectal pain. Negative for abdominal distention, abdominal pain, anal bleeding, blood in stool, constipation, diarrhea, nausea and vomiting.  Endocrine: Positive for polydipsia.  Genitourinary: Negative for difficulty urinating, dysuria, flank pain and hematuria.  Musculoskeletal: Negative for arthralgias, back pain, gait problem, joint swelling and myalgias.  Skin: Positive for color change and wound. Negative for pallor and rash.  Neurological: Positive for numbness. Negative for dizziness, tremors, weakness and light-headedness.  Hematological: Negative for adenopathy. Does not bruise/bleed easily.  Psychiatric/Behavioral: Negative for agitation, behavioral problems, confusion, decreased concentration and sleep disturbance.       Objective:   Physical Exam  Constitutional: He is oriented to person, place, and time. He appears well-developed and well-nourished.  HENT:  Head: Normocephalic and atraumatic.  Mouth/Throat: Oropharynx is clear and moist.  Eyes: Conjunctivae and EOM are normal.  Neck: Normal range of motion. Neck supple. No JVD present.  Cardiovascular: Normal rate and regular rhythm.   Pulmonary/Chest: Effort normal. No respiratory distress. He has no wheezes.  Abdominal: Soft. He exhibits no distension.  Musculoskeletal: Normal range of motion. He exhibits no edema or tenderness.  Neurological: He is alert and oriented to person, place, and time.  Skin: Skin is warm and dry. No rash noted. There is erythema. No pallor.  Psychiatric: He has a normal mood and affect. His behavior is normal. Thought content normal. Cognition and memory are normal.   Buttocks today 03/1817:  H has areas on right buttocks that is exquisitely tender to palpation in the center where she tries to  show me is the "hole" but no visible hole. There is another slightly erytheamtous area an inch away that becomes more full when this is pressed          Assessment & Plan:   Boils and now New Boil with possible peri-rectal abscess with concern for fistula:  I will get CT abdomen and pelvis to look at this area more closely  I will consult General surgery   Poorly controlled DM: worse and only on metfomrin once a day. Refer to PCP, int he interim increase the metformin to 1 g bid   HIV : she needs labs to come back. They were sent by Selena Batten with research and VL is still pending. We will change [name] to [facility] for STR and to avoid issue of the metformin elevation with DTG    HTN:  Not at goal for diabetic Vitals:   03/30/17 1334  BP: 134/90  Pulse: 90  Temp: 98.2 F (36.8 C)  Transgender: aldactone rx continued

## 2017-03-31 ENCOUNTER — Telehealth: Payer: Self-pay | Admitting: *Deleted

## 2017-03-31 MED FILL — SPIRONOLACTONE 50 MG TAB: 50 | 30 days supply | Qty: 30 | Fill #3

## 2017-03-31 MED FILL — OMEPRAZOLE 20 MG CAP: 20 | 30 days supply | Qty: 60 | Fill #4

## 2017-03-31 MED FILL — BIKTARVY 50-200-25 MG TABS: 50-200-25 | 30 days supply | Qty: 30 | Fill #0

## 2017-03-31 MED FILL — metFORMIN HCL 500 MG TABS: 500 | 30 days supply | Qty: 120 | Fill #0

## 2017-03-31 MED FILL — ESCITALOPRAM 20 MG TABLET: 20 | 30 days supply | Qty: 30 | Fill #3

## 2017-03-31 NOTE — Telephone Encounter (Signed)
[facility] Monday 1022/18 at 10:50 AM with Dr. Maisie Fushomas.

## 2017-04-05 ENCOUNTER — Ambulatory Visit: Payer: Medicaid Other | Admitting: Infectious Disease

## 2017-04-12 ENCOUNTER — Telehealth: Payer: Self-pay | Admitting: Infectious Disease

## 2017-04-12 DIAGNOSIS — R1084 Generalized abdominal pain: Secondary | ICD-10-CM

## 2017-04-12 NOTE — Telephone Encounter (Signed)
Perfect

## 2017-04-12 NOTE — Telephone Encounter (Signed)
I rewrote CT scan for [name] BumpsJessica WITh contrast only which his payor source said they would cover

## 2017-04-12 NOTE — Telephone Encounter (Signed)
Morrie Sheldonshley aware and she will get this scheduled.

## 2017-04-17 LAB — HIV-1 RNA QUANT-NO REFLEX-BLD

## 2017-04-20 ENCOUNTER — Encounter: Payer: Self-pay | Admitting: *Deleted

## 2017-04-25 ENCOUNTER — Other Ambulatory Visit: Payer: Self-pay | Admitting: Infectious Disease

## 2017-05-01 ENCOUNTER — Ambulatory Visit
Admission: RE | Admit: 2017-05-01 | Discharge: 2017-05-01 | Disposition: A | Discharge status: Home / Self Care | Payer: Medicaid Other | Source: Ambulatory Visit | Attending: Infectious Disease | Admitting: Infectious Disease

## 2017-05-01 DIAGNOSIS — R1084 Generalized abdominal pain: Secondary | ICD-10-CM

## 2017-05-01 MED ORDER — IOPAMIDOL (ISOVUE-300) INJECTION 61%
125.0000 mL | Freq: Once | INTRAVENOUS | Status: AC | PRN
  Administered 2017-05-01: 125 mL via INTRAVENOUS

## 2017-05-08 ENCOUNTER — Telehealth: Payer: Self-pay | Admitting: *Deleted

## 2017-05-08 NOTE — Telephone Encounter (Signed)
Patient calling for CT results. She missed her appointment at [facility] ("I slept through it") but is calling there now to see if they will still see her. Please advise. [name], [name] Basham M, RN

## 2017-05-08 NOTE — Telephone Encounter (Signed)
Basically it shows several small areas of fluid collections, potentially abscesses. It would be very helpful if she goes to her appt with [facility].

## 2017-05-10 MED FILL — metFORMIN HCL 500 MG TABS: 500 | 30 days supply | Qty: 120 | Fill #1

## 2017-05-10 MED FILL — ESCITALOPRAM 20 MG TABLET: 20 | 30 days supply | Qty: 30 | Fill #4

## 2017-05-10 MED FILL — OMEPRAZOLE 20 MG CAP: 20 | 30 days supply | Qty: 60 | Fill #5

## 2017-05-10 MED FILL — BIKTARVY 50-200-25 MG TABS: 50-200-25 | 30 days supply | Qty: 30 | Fill #1

## 2017-05-10 MED FILL — SPIRONOLACTONE 50 MG TABLET: 50 | 30 days supply | Qty: 30 | Fill #4

## 2017-05-10 NOTE — Telephone Encounter (Signed)
Left message encouraging patient to reschedule her missed appointment at [facility].

## 2017-05-15 ENCOUNTER — Ambulatory Visit: Payer: Medicaid Other | Admitting: Infectious Disease

## 2017-06-22 MED FILL — metFORMIN HCL 500 MG TABS: 500 | 30 days supply | Qty: 120 | Fill #2

## 2017-06-22 MED FILL — BIKTARVY 50-200-25 MG TABS: 50-200-25 | 30 days supply | Qty: 30 | Fill #2

## 2017-06-22 MED FILL — ESCITALOPRAM 20 MG TABLET: 20 | 30 days supply | Qty: 30 | Fill #5

## 2017-06-22 MED FILL — SPIRONOLACTONE 50 MG TABLET: 50 | 30 days supply | Qty: 30 | Fill #5

## 2017-08-18 ENCOUNTER — Other Ambulatory Visit: Payer: Self-pay | Admitting: Pharmacist

## 2017-08-18 DIAGNOSIS — F321 Major depressive disorder, single episode, moderate: Secondary | ICD-10-CM

## 2017-08-18 DIAGNOSIS — B20 Human immunodeficiency virus [HIV] disease: Secondary | ICD-10-CM

## 2017-08-18 MED ORDER — SPIRONOLACTONE 50 MG PO TABS: 50.0000 mg | ORAL_TABLET | Freq: Every day | ORAL | 5 refills | Status: DC

## 2017-08-18 MED ORDER — ESCITALOPRAM OXALATE 20 MG PO TABS: 20.0000 mg | ORAL_TABLET | Freq: Every day | ORAL | 5 refills | Status: DC

## 2017-08-18 MED FILL — BIKTARVY 50-200-25 MG TABS: 50-200-25 | 30 days supply | Qty: 30 | Fill #3

## 2017-08-18 MED FILL — SPIRONOLACTONE 50 MG TABLET: 50 | 30 days supply | Qty: 30 | Fill #0

## 2017-08-18 MED FILL — metFORMIN HCL 500 MG TABS: 500 | 30 days supply | Qty: 120 | Fill #3

## 2017-08-18 MED FILL — ESCITALOPRAM 20 MG TABLET: 20 | 30 days supply | Qty: 30 | Fill #0

## 2017-09-04 ENCOUNTER — Encounter (INDEPENDENT_AMBULATORY_CARE_PROVIDER_SITE_OTHER): Payer: Self-pay | Admitting: *Deleted

## 2017-09-04 VITALS — BP 132/87 | HR 93 | Temp 98.2°F | Wt 248.2 lb

## 2017-09-04 DIAGNOSIS — Z006 Encounter for examination for normal comparison and control in clinical research program: Secondary | ICD-10-CM

## 2017-09-04 NOTE — Progress Notes (Signed)
[name] BumpsJessica Viviann Spare(Kairav) is here for week 264 visit for HAILO Study: A Long Term follow-up of Older HIV-Infected Adults in the ACTG, an observational study addressing the issues of aging, HIV infection and Inflammation. Continues to have problems with the draining lesions on her (L) buttock. Was seen by general surgeon and was given clindamycin which she states did not help area on buttocks. Now has a new area in her groin which is inflamed and very painful. Discussed with Dr. Daiva EvesVan Dam. He recommended she follow-up with [facility] and questioned if she had ever tried doxycycline in the past. She states no improvement with doxycycline and that  the only thing that has every helped these areas was vancomycin. She will return in September for her next study visit.

## 2017-09-09 LAB — HIV-1 RNA QUANT-NO REFLEX-BLD: HIV 1 RNA VIRAL LOAD: 76

## 2017-09-18 ENCOUNTER — Encounter: Payer: Self-pay | Admitting: *Deleted

## 2017-09-18 MED FILL — SPIRONOLACTONE 50 MG TABLET: 50 | 30 days supply | Qty: 30 | Fill #1

## 2017-09-18 MED FILL — BIKTARVY 50-200-25 MG TABS: 50-200-25 | 30 days supply | Qty: 30 | Fill #4

## 2017-09-18 MED FILL — metFORMIN HCL 500 MG TABS: 500 | 30 days supply | Qty: 120 | Fill #4

## 2017-09-18 MED FILL — ESCITALOPRAM 20 MG TABLET: 20 | 30 days supply | Qty: 30 | Fill #1

## 2017-10-25 MED FILL — ESCITALOPRAM 20 MG TABLET: 20 | 30 days supply | Qty: 30 | Fill #2

## 2017-10-25 MED FILL — metFORMIN HCL 500 MG TABS: 500 | 30 days supply | Qty: 120 | Fill #5

## 2017-10-25 MED FILL — SPIRONOLACTONE 50 MG TABLET: 50 | 30 days supply | Qty: 30 | Fill #2

## 2017-10-25 MED FILL — BIKTARVY 50-200-25 MG TABS: 50-200-25 | 30 days supply | Qty: 30 | Fill #5

## 2017-10-27 ENCOUNTER — Emergency Department (HOSPITAL_COMMUNITY)
Admission: EM | Admit: 2017-10-27 | Discharge: 2017-10-27 | Disposition: A | Discharge status: Home / Self Care | Payer: Medicaid Other

## 2017-10-27 ENCOUNTER — Telehealth: Payer: Self-pay | Admitting: Infectious Diseases

## 2017-10-27 NOTE — ED Notes (Signed)
Pt left without being seen before triage.

## 2017-10-27 NOTE — Telephone Encounter (Signed)
Pt called c/o vision changes (color pin wheels spinning in vision) for last 2 weeks.  He called eye doctor who told him to go to ED, called ID asking if this sounded correct.  I suggested that he go to ED for further eval.

## 2017-10-30 ENCOUNTER — Encounter (HOSPITAL_COMMUNITY): Payer: Self-pay

## 2017-10-30 ENCOUNTER — Observation Stay (HOSPITAL_COMMUNITY)
Admission: EM | Admit: 2017-10-30 | Discharge: 2017-11-01 | Disposition: A | Discharge status: Home / Self Care | Payer: Medicaid Other | Attending: Emergency Medicine | Admitting: Emergency Medicine

## 2017-10-30 DIAGNOSIS — Z79899 Other long term (current) drug therapy: Secondary | ICD-10-CM | POA: Diagnosis not present

## 2017-10-30 DIAGNOSIS — R930 Abnormal findings on diagnostic imaging of skull and head, not elsewhere classified: Secondary | ICD-10-CM

## 2017-10-30 DIAGNOSIS — Z7984 Long term (current) use of oral hypoglycemic drugs: Secondary | ICD-10-CM | POA: Diagnosis not present

## 2017-10-30 DIAGNOSIS — H53142 Visual discomfort, left eye: Secondary | ICD-10-CM | POA: Insufficient documentation

## 2017-10-30 DIAGNOSIS — R51 Headache: Secondary | ICD-10-CM | POA: Insufficient documentation

## 2017-10-30 DIAGNOSIS — E669 Obesity, unspecified: Secondary | ICD-10-CM

## 2017-10-30 DIAGNOSIS — G43109 Migraine with aura, not intractable, without status migrainosus: Secondary | ICD-10-CM | POA: Diagnosis present

## 2017-10-30 DIAGNOSIS — E1165 Type 2 diabetes mellitus with hyperglycemia: Secondary | ICD-10-CM | POA: Diagnosis present

## 2017-10-30 DIAGNOSIS — J45909 Unspecified asthma, uncomplicated: Secondary | ICD-10-CM | POA: Diagnosis not present

## 2017-10-30 DIAGNOSIS — E119 Type 2 diabetes mellitus without complications: Secondary | ICD-10-CM | POA: Insufficient documentation

## 2017-10-30 DIAGNOSIS — F17210 Nicotine dependence, cigarettes, uncomplicated: Secondary | ICD-10-CM | POA: Diagnosis not present

## 2017-10-30 DIAGNOSIS — F17200 Nicotine dependence, unspecified, uncomplicated: Secondary | ICD-10-CM

## 2017-10-30 DIAGNOSIS — F1210 Cannabis abuse, uncomplicated: Secondary | ICD-10-CM | POA: Insufficient documentation

## 2017-10-30 DIAGNOSIS — H5340 Unspecified visual field defects: Secondary | ICD-10-CM | POA: Diagnosis not present

## 2017-10-30 DIAGNOSIS — I10 Essential (primary) hypertension: Secondary | ICD-10-CM | POA: Diagnosis not present

## 2017-10-30 DIAGNOSIS — F418 Other specified anxiety disorders: Secondary | ICD-10-CM | POA: Diagnosis present

## 2017-10-30 DIAGNOSIS — E1169 Type 2 diabetes mellitus with other specified complication: Secondary | ICD-10-CM

## 2017-10-30 DIAGNOSIS — B20 Human immunodeficiency virus [HIV] disease: Secondary | ICD-10-CM | POA: Diagnosis present

## 2017-10-30 LAB — CBC WITH DIFFERENTIAL/PLATELET
ABS IMMATURE GRANULOCYTES: 0.1 10*3/uL (ref 0.0–0.1)
BASOS ABS: 0.1 10*3/uL (ref 0.0–0.1)
BASOS PCT: 1 %
Eosinophils Absolute: 0.2 10*3/uL (ref 0.0–0.7)
Eosinophils Relative: 2 %
HCT: 51.7 % (ref 39.0–52.0)
Hemoglobin: 18.2 g/dL — ABNORMAL HIGH (ref 13.0–17.0)
Immature Granulocytes: 1 %
Lymphocytes Relative: 31 %
Lymphs Abs: 4.1 10*3/uL — ABNORMAL HIGH (ref 0.7–4.0)
MCH: 31.7 pg (ref 26.0–34.0)
MCHC: 35.2 g/dL (ref 30.0–36.0)
MCV: 89.9 fL (ref 78.0–100.0)
MONO ABS: 1.2 10*3/uL — AB (ref 0.1–1.0)
MONOS PCT: 9 %
NEUTROS ABS: 7.6 10*3/uL (ref 1.7–7.7)
Neutrophils Relative %: 56 %
PLATELETS: 302 10*3/uL (ref 150–400)
RBC: 5.75 MIL/uL (ref 4.22–5.81)
RDW: 11.9 % (ref 11.5–15.5)
WBC: 13.3 10*3/uL — ABNORMAL HIGH (ref 4.0–10.5)

## 2017-10-30 LAB — URINALYSIS, ROUTINE W REFLEX MICROSCOPIC
Bilirubin Urine: NEGATIVE
HGB URINE DIPSTICK: NEGATIVE
KETONES UR: 5 mg/dL — AB
LEUKOCYTES UA: NEGATIVE
NITRITE: NEGATIVE
PH: 5 (ref 5.0–8.0)
Protein, ur: 100 mg/dL — AB
Specific Gravity, Urine: 1.042 — ABNORMAL HIGH (ref 1.005–1.030)

## 2017-10-30 LAB — COMPREHENSIVE METABOLIC PANEL
ALT: 29 U/L (ref 17–63)
AST: 21 U/L (ref 15–41)
Albumin: 3.9 g/dL (ref 3.5–5.0)
Alkaline Phosphatase: 99 U/L (ref 38–126)
Anion gap: 10 (ref 5–15)
BUN: 11 mg/dL (ref 6–20)
CHLORIDE: 100 mmol/L — AB (ref 101–111)
CO2: 24 mmol/L (ref 22–32)
CREATININE: 0.77 mg/dL (ref 0.61–1.24)
Calcium: 9.3 mg/dL (ref 8.9–10.3)
Glucose, Bld: 380 mg/dL — ABNORMAL HIGH (ref 65–99)
Potassium: 4.1 mmol/L (ref 3.5–5.1)
SODIUM: 134 mmol/L — AB (ref 135–145)
Total Bilirubin: 0.8 mg/dL (ref 0.3–1.2)
Total Protein: 7.5 g/dL (ref 6.5–8.1)

## 2017-10-30 LAB — CBG MONITORING, ED
Glucose-Capillary: 356 mg/dL — ABNORMAL HIGH (ref 65–99)
Glucose-Capillary: 324 mg/dL — ABNORMAL HIGH (ref 65–99)

## 2017-10-30 MED ORDER — METOCLOPRAMIDE HCL 5 MG/ML IJ SOLN
10.0000 mg | Freq: Once | INTRAMUSCULAR | Status: AC
  Administered 2017-10-30: 10 mg via INTRAVENOUS
  Filled 2017-10-30: qty 2

## 2017-10-30 MED ORDER — SODIUM CHLORIDE 0.9 % IV BOLUS
1000.0000 mL | Freq: Once | INTRAVENOUS | Status: AC
  Administered 2017-10-30: 1000 mL via INTRAVENOUS

## 2017-10-30 MED ORDER — HOMATROPINE HBR 2 % OP SOLN
2.0000 [drp] | Freq: Once | OPHTHALMIC | Status: DC
  Filled 2017-10-30 (×2): qty 5

## 2017-10-30 MED ORDER — DEXAMETHASONE SODIUM PHOSPHATE 10 MG/ML IJ SOLN
10.0000 mg | Freq: Once | INTRAMUSCULAR | Status: AC
  Administered 2017-10-30: 10 mg via INTRAVENOUS
  Filled 2017-10-30: qty 1

## 2017-10-30 MED ORDER — IPRATROPIUM-ALBUTEROL 0.5-2.5 (3) MG/3ML IN SOLN
3.0000 mL | Freq: Once | RESPIRATORY_TRACT | Status: AC
  Administered 2017-10-30: 3 mL via RESPIRATORY_TRACT
  Filled 2017-10-30: qty 3

## 2017-10-30 MED ORDER — VALPROATE SODIUM 500 MG/5ML IV SOLN
500.0000 mg | Freq: Once | INTRAVENOUS | Status: DC
  Filled 2017-10-30 (×2): qty 5

## 2017-10-30 MED ORDER — PROCHLORPERAZINE EDISYLATE 10 MG/2ML IJ SOLN
10.0000 mg | Freq: Once | INTRAMUSCULAR | Status: AC
  Administered 2017-10-30: 10 mg via INTRAMUSCULAR
  Filled 2017-10-30: qty 2

## 2017-10-30 MED ORDER — DIPHENHYDRAMINE HCL 50 MG/ML IJ SOLN
25.0000 mg | Freq: Once | INTRAMUSCULAR | Status: AC
Start: 2017-10-30 — End: 2017-10-30
  Administered 2017-10-30: 25 mg via INTRAVENOUS
  Filled 2017-10-30: qty 1

## 2017-10-30 MED ORDER — VALPROATE SODIUM 500 MG/5ML IV SOLN
500.0000 mg | Freq: Once | INTRAVENOUS | Status: AC
  Administered 2017-10-30: 500 mg via INTRAVENOUS
  Filled 2017-10-30: qty 5

## 2017-10-30 MED ORDER — FLUORESCEIN SODIUM 1 MG OP STRP
2.0000 | ORAL_STRIP | Freq: Once | OPHTHALMIC | Status: AC
  Administered 2017-10-30: 2 via OPHTHALMIC
  Filled 2017-10-30 (×2): qty 2

## 2017-10-30 MED ORDER — MECLIZINE HCL 25 MG PO TABS
25.0000 mg | ORAL_TABLET | Freq: Once | ORAL | Status: AC
  Administered 2017-10-30: 25 mg via ORAL
  Filled 2017-10-30: qty 1

## 2017-10-30 MED ORDER — KETOROLAC TROMETHAMINE 60 MG/2ML IM SOLN
30.0000 mg | Freq: Once | INTRAMUSCULAR | Status: AC
  Administered 2017-10-30: 30 mg via INTRAMUSCULAR
  Filled 2017-10-30: qty 2

## 2017-10-30 MED ORDER — TETRACAINE HCL 0.5 % OP SOLN
2.0000 [drp] | Freq: Once | OPHTHALMIC | Status: AC
Start: 2017-10-30 — End: 2017-10-30
  Administered 2017-10-30: 2 [drp] via OPHTHALMIC
  Filled 2017-10-30: qty 4

## 2017-10-30 MED ORDER — INSULIN ASPART 100 UNIT/ML ~~LOC~~ SOLN
8.0000 [IU] | Freq: Once | SUBCUTANEOUS | Status: AC
Start: 2017-10-30 — End: 2017-10-30
  Administered 2017-10-30: 8 [IU] via SUBCUTANEOUS
  Filled 2017-10-30: qty 1

## 2017-10-30 NOTE — ED Provider Notes (Signed)
Patient placed in Quick Look pathway, seen and evaluated   Chief Complaint: blurred vision, spinning "colors"  HPI:   States seeing spinning pins and needles in vision for a week. States come and goes and constant in the last 4 days. Reports associated headache. No nausea or vomiting. Reports photophobia. Reports symptoms in both eyes. Denies similar symptoms in the past. No visual problems in the past. Reports dizziness. No cp or sob.   ROS: headache, visual changes. Negative for n/v, numbness, weakness  Physical Exam:   Gen: No distress  Neuro: Awake and Alert  Skin: Warm    Focused Exam: PERRLA. Extra occular movements intact. Lungs clear. Regular HR and rhythm. Coordination intact, normal finger to nose bialterally   Initiation of care has begun. The patient has been counseled on the process, plan, and necessity for staying for the completion/evaluation, and the remainder of the medical screening examination  Patient emergency department with headache, dizziness, spinning colored pins and visual fields.  Visual changes are bilaterally.  Patient states her blood sugar has been high recently and over 300 here.  She is requesting to get A1c checked.  I will put in basic labs, will try Toradol and Compazine for possible complex migraine, meclizine for possible vertigo.  She has no other neurological complaints or exam findings.    Vitals:   10/30/17 1411  BP: (!) 139/103  Pulse: (!) 103  Resp: 16  Temp: 99.2 F (37.3 C)  TempSrc: Oral  SpO2: 95%      Jaynie Crumble, PA-C 10/30/17 1433    Margarita Grizzle, MD 10/31/17 1635

## 2017-10-30 NOTE — ED Notes (Signed)
Messaged pharmacy to send medicine to [facility]

## 2017-10-30 NOTE — Progress Notes (Signed)
Patient unavailable for nebulizer treatment at this time. RN to give.

## 2017-10-30 NOTE — ED Provider Notes (Signed)
[facility] EMERGENCY DEPARTMENT Provider Note   CSN: [id number] Arrival date & time: 10/30/17  1318     History   Chief Complaint Chief Complaint  Patient presents with  . Visual Field Change  . Hyperglycemia    HPI [name] is a 47 y.o. male.  HPI  47 yo M with PMHx HTN, HIV, DM, smoking here with visual field changes. Pt states that for hte past 2 weeks, she's had intermittent bright flashes in the left side of her visual fields. She sees a bright spot that then becomes "rainbow" colors, moves around, then goes away. It is always in the left side of her left and right eyes. She has assocaited aching, throbbing, retrobulbar headache. No associated numbness, weakness. No dysarthria or dysphagia. No recent fever or chills. No recent med changes. No neck stiffness. Last CD4 was >1000 per review of records. No alleviating or aggravating factors.  Past Medical History:  Diagnosis Date  . Diabetes mellitus   . HIV disease (HCC)   . Hypertension   . MRSA carrier   . Perirectal abscess 03/30/2017  . Polyuria 04/27/2015  . Poorly controlled diabetes mellitus (HCC) 04/27/2015  . Testicular mass 06/20/2016  . Transgender 04/27/2015    Patient Active Problem List   Diagnosis Date Noted  . Perirectal abscess 03/30/2017  . Testicular mass 06/20/2016  . Essential hypertension 06/01/2016  . Diabetes mellitus type 2 in obese (HCC) 06/01/2016  . Facial cellulitis 05/31/2016  . Polyuria 04/27/2015  . Poorly controlled diabetes mellitus (HCC) 04/27/2015  . Transgender 04/27/2015  . ANUG (acute necrotizing ulcerative gingivitis) 09/23/2013  . Tinea pedis of both feet 07/24/2013  . Hearing loss secondary to cerumen impaction 02/07/2013  . Parotitis 12/03/2012  . Conjunctivitis 12/03/2012  . Rash 02/14/2012  . Scabies 07/20/2011  . Tobacco abuse 03/07/2011  . Cervicalgia 03/07/2011  . 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 CHRONIC PAIN SYNDROME 06/11/2009  .  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 SHOULDER PAIN, CHRONIC 01/13/2009  . Anxiety state 11/04/2008  . ROTATOR CUFF INJURY, RIGHT SHOULDER 11/04/2008  . HYPOKALEMIA 09/27/2008  . SWELLING MASS OR LUMP IN HEAD AND NECK 09/27/2008  . CHEST PAIN 09/27/2008  . TRANSAMINASES, SERUM, ELEVATED 09/27/2008  . DIABETES MELLITUS, BORDERLINE 09/09/2008  . Asthma 02/07/2008  . LUMBAGO 02/07/2008  . GENDER IDENTITY DISORDER 10/31/2007  . DEPRESSION 10/17/2007  . CELLULITIS AND ABSCESS OF OTHER SPECIFIED SITE 10/17/2007  . PRODUCTIVE COUGH 04/24/2007  . Acute bronchitis 03/02/2007  . DIARRHEA 03/02/2007  . ABSCESS, PARAPHARYNGEAL 12/08/2006  . Human immunodeficiency virus (HIV) disease (HCC) 04/14/2006  . HIDRADENITIS SUPPURATIVA 04/14/2006    Past Surgical History:  Procedure Laterality Date  . APPENDECTOMY          Home Medications    Prior to Admission medications   Medication Sig Start Date End Date Taking? Authorizing Provider  albuterol (PROVENTIL HFA;VENTOLIN HFA) 108 (90 BASE) MCG/ACT inhaler Inhale 2 puffs into the lungs every 6 (six) hours as needed for wheezing or shortness of breath. 04/27/15  Yes [name], Lisette Grinder, MD  bictegravir-emtricitabine-tenofovir AF (BIKTARVY) 50-200-25 MG TABS tablet Take 1 tablet by mouth daily. 03/30/17  Yes [name], Lisette Grinder, MD  escitalopram (LEXAPRO) 20 MG tablet Take 1 tablet (20 mg total) by mouth at bedtime. 08/18/17  Yes Kuppelweiser, Cassie L, RPH-CPP  metFORMIN (GLUCOPHAGE) 500 MG tablet Take 2 tablets (1,000 mg total) by mouth 2 (two) times daily with a meal. Take ONE 500 BID x 2  weeks then 2 tabs BID 03/30/17  Yes [name], Lisette Grinder, MD  omeprazole (PRILOSEC) 20 MG capsule Take 2 capsules (40 mg total) by mouth at bedtime. 12/06/16  Yes [name], Lisette Grinder, MD  spironolactone (ALDACTONE) 50 MG tablet Take 1 tablet (50 mg total) by mouth daily. 08/18/17  Yes Kuppelweiser, Cassie L, RPH-CPP    Family History No family history on  file.  Social History Social History   Tobacco Use  . Smoking status: Current Every Day Smoker    Packs/day: 1.00    Years: 27.00    Pack years: 27.00    Types: Cigarettes  . Smokeless tobacco: Never Used  Substance Use Topics  . Alcohol use: No    Alcohol/week: 0.0 oz  . Drug use: Yes    Types: Marijuana     Allergies   Propoxyphene n-acetaminophen   Review of Systems Review of Systems  Constitutional: Negative for chills, fatigue and fever.  HENT: Negative for congestion and rhinorrhea.   Eyes: Positive for visual disturbance.  Respiratory: Negative for cough, shortness of breath and wheezing.   Cardiovascular: Negative for chest pain and leg swelling.  Gastrointestinal: Negative for abdominal pain, diarrhea, nausea and vomiting.  Genitourinary: Negative for dysuria and flank pain.  Musculoskeletal: Negative for neck pain and neck stiffness.  Skin: Negative for rash and wound.  Allergic/Immunologic: Negative for immunocompromised state.  Neurological: Positive for headaches. Negative for syncope and weakness.  All other systems reviewed and are negative.    Physical Exam Updated Vital Signs BP 129/85   Pulse 81   Temp 98.4 F (36.9 C) (Oral)   Resp 18   SpO2 96%   Physical Exam  Constitutional: He is oriented to person, place, and time. He appears well-developed and well-nourished. No distress.  HENT:  Head: Normocephalic and atraumatic.  Eyes: Conjunctivae are normal.  IOP 16 OS, 14 OD; mild conjunctival injection b/l without ciliary flush, anterior chambers quiet bilaterally, visualized retina w/o obvious abnormality  Neck: Neck supple.  Cardiovascular: Normal rate, regular rhythm and normal heart sounds. Exam reveals no friction rub.  No murmur heard. Pulmonary/Chest: Effort normal and breath sounds normal. No respiratory distress. He has no wheezes. He has no rales.  Abdominal: He exhibits no distension.  Musculoskeletal: He exhibits no edema.    Neurological: He is alert and oriented to person, place, and time. He exhibits normal muscle tone.  Skin: Skin is warm. Capillary refill takes less than 2 seconds.  Psychiatric: He has a normal mood and affect.  Nursing note and vitals reviewed.   Neurological Exam:  Mental Status: Alert and oriented to person, place, and time. Attention and concentration normal. Speech clear. Recent memory is intact. Cranial Nerves: Visual fields grossly intact. EOMI and PERRLA. No nystagmus noted. Facial sensation intact at forehead, maxillary cheek, and chin/mandible bilaterally. No facial asymmetry or weakness. Hearing grossly normal. Uvula is midline, and palate elevates symmetrically. Normal SCM and trapezius strength. Tongue midline without fasciculations. Motor: Muscle strength 5/5 in proximal and distal UE and LE bilaterally. No pronator drift. Muscle tone normal. Reflexes: 2+ and symmetrical in all four extremities.  Sensation: Intact to light touch in upper and lower extremities distally bilaterally.  Gait: Normal without ataxia. Coordination: Normal FTN bilaterally.   ED Treatments / Results  Labs (all labs ordered are listed, but only abnormal results are displayed) Labs Reviewed  CBC WITH DIFFERENTIAL/PLATELET - Abnormal; Notable for the following components:      Result Value  WBC 13.3 (*)    Hemoglobin 18.2 (*)    Lymphs Abs 4.1 (*)    Monocytes Absolute 1.2 (*)    All other components within normal limits  COMPREHENSIVE METABOLIC PANEL - Abnormal; Notable for the following components:   Sodium 134 (*)    Chloride 100 (*)    Glucose, Bld 380 (*)    All other components within normal limits  CBG MONITORING, ED - Abnormal; Notable for the following components:   Glucose-Capillary 356 (*)    All other components within normal limits  CBG MONITORING, ED - Abnormal; Notable for the following components:   Glucose-Capillary 324 (*)    All other components within normal limits   URINALYSIS, ROUTINE W REFLEX MICROSCOPIC  HEMOGLOBIN A1C    EKG EKG Interpretation  Date/Time:  Monday Oct 30 2017 20:22:05 EDT Ventricular Rate:  87 PR Interval:    QRS Duration: 88 QT Interval:  363 QTC Calculation: 437 R Axis:   77 Text Interpretation:  Sinus rhythm Low voltage, extremity leads No significant change since last tracing Confirmed by Shaune Pollack [phone]) on 10/30/2017 9:58:27 PM   Radiology No results found.  Procedures Procedures (including critical care time)  Medications Ordered in ED Medications  homatropine 2 % ophthalmic solution 2 drop (has no administration in time range)  sodium chloride 0.9 % bolus 1,000 mL (1,000 mLs Intravenous New Bag/Given 10/30/17 2202)  valproate (DEPACON) injection 500 mg (has no administration in time range)  ketorolac (TORADOL) injection 30 mg (30 mg Intramuscular Given 10/30/17 1736)  prochlorperazine (COMPAZINE) injection 10 mg (10 mg Intramuscular Given 10/30/17 1737)  meclizine (ANTIVERT) tablet 25 mg (25 mg Oral Given 10/30/17 1737)  tetracaine (PONTOCAINE) 0.5 % ophthalmic solution 2 drop (2 drops Both Eyes Given by Other 10/30/17 2136)  fluorescein ophthalmic strip 2 strip (2 strips Both Eyes Given by Other 10/30/17 2136)  insulin aspart (novoLOG) injection 8 Units (8 Units Subcutaneous Given 10/30/17 2205)  dexamethasone (DECADRON) injection 10 mg (10 mg Intravenous Given 10/30/17 2203)  metoCLOPramide (REGLAN) injection 10 mg (10 mg Intravenous Given 10/30/17 2203)  diphenhydrAMINE (BENADRYL) injection 25 mg (25 mg Intravenous Given 10/30/17 2202)     Initial Impression / Assessment and Plan / ED Course  I have reviewed the triage vital signs and the nursing notes.  Pertinent labs & imaging results that were available during my care of the patient were reviewed by me and considered in my medical decision making (see chart for details).  Clinical Course as of Oct 31 2210  Mon Oct 30, 2017  2210 Follow up on MRI, If  positive then call neuro, if better follow up as outpatient, monitor headache.    [EH]    Clinical Course User Index [EH] Cristina Gong, PA-C    47 yo M here with intermittent bright flashes in his bilateral left visual fields with HA. DDx complex migraine, but must also consider encephalitis, CVA (less likely), intracranial lesion 2/2 h/o HIV-AIDS (lymphoma, toxo). Discussed with Dr. Wilford Corner who recommends MRI. Will give migraine cocktail as well. Slit lamp shows no abnormalities, and vision intact between episodes. This, combined with bilateral sx, normal IOPs, makes ocular etiology less likely.  Patient care transferred to [facility] and Dr. Blinda Leatherwood at the end of my shift. Patient presentation, ED course, and plan of care discussed with review of all pertinent labs and imaging. Please see his/her note for further details regarding further ED course and disposition.   Final Clinical Impressions(s) / ED  Diagnoses   Final diagnoses:  Visual field defect    ED Discharge Orders    None       Shaune Pollack, MD 10/30/17 2212

## 2017-10-30 NOTE — ED Triage Notes (Signed)
PT reports pressure behind bilateral eyes with "colorful swirly smudges" in visual field in bilateral eyes since last Monday. Pt states he has DM2 and feels the metformin isn't working anymore. cbg 356 in triage

## 2017-10-30 NOTE — ED Notes (Signed)
Messaged pharmacy to send meds to pod E.

## 2017-10-31 ENCOUNTER — Encounter (HOSPITAL_COMMUNITY): Payer: Self-pay | Admitting: Family Medicine

## 2017-10-31 ENCOUNTER — Emergency Department (HOSPITAL_COMMUNITY): Payer: Medicaid Other

## 2017-10-31 ENCOUNTER — Observation Stay (HOSPITAL_COMMUNITY): Payer: Medicaid Other

## 2017-10-31 ENCOUNTER — Other Ambulatory Visit: Payer: Self-pay

## 2017-10-31 ENCOUNTER — Telehealth: Payer: Self-pay | Admitting: Behavioral Health

## 2017-10-31 DIAGNOSIS — E669 Obesity, unspecified: Secondary | ICD-10-CM

## 2017-10-31 DIAGNOSIS — E1165 Type 2 diabetes mellitus with hyperglycemia: Secondary | ICD-10-CM

## 2017-10-31 DIAGNOSIS — F418 Other specified anxiety disorders: Secondary | ICD-10-CM | POA: Diagnosis not present

## 2017-10-31 DIAGNOSIS — R930 Abnormal findings on diagnostic imaging of skull and head, not elsewhere classified: Secondary | ICD-10-CM | POA: Diagnosis not present

## 2017-10-31 DIAGNOSIS — I639 Cerebral infarction, unspecified: Secondary | ICD-10-CM | POA: Diagnosis not present

## 2017-10-31 DIAGNOSIS — I10 Essential (primary) hypertension: Secondary | ICD-10-CM | POA: Diagnosis not present

## 2017-10-31 DIAGNOSIS — H5340 Unspecified visual field defects: Secondary | ICD-10-CM | POA: Insufficient documentation

## 2017-10-31 DIAGNOSIS — B20 Human immunodeficiency virus [HIV] disease: Secondary | ICD-10-CM | POA: Diagnosis not present

## 2017-10-31 DIAGNOSIS — E1169 Type 2 diabetes mellitus with other specified complication: Secondary | ICD-10-CM | POA: Diagnosis not present

## 2017-10-31 DIAGNOSIS — F17200 Nicotine dependence, unspecified, uncomplicated: Secondary | ICD-10-CM

## 2017-10-31 DIAGNOSIS — G43109 Migraine with aura, not intractable, without status migrainosus: Secondary | ICD-10-CM | POA: Insufficient documentation

## 2017-10-31 LAB — GLUCOSE, CAPILLARY
Glucose-Capillary: 271 mg/dL — ABNORMAL HIGH (ref 65–99)
Glucose-Capillary: 421 mg/dL — ABNORMAL HIGH (ref 65–99)
Glucose-Capillary: 461 mg/dL — ABNORMAL HIGH (ref 65–99)

## 2017-10-31 LAB — CBG MONITORING, ED
GLUCOSE-CAPILLARY: 403 mg/dL — AB (ref 65–99)
GLUCOSE-CAPILLARY: 307 mg/dL — AB (ref 65–99)
Glucose-Capillary: 450 mg/dL — ABNORMAL HIGH (ref 65–99)

## 2017-10-31 MED ORDER — ESCITALOPRAM OXALATE 10 MG PO TABS
20.0000 mg | ORAL_TABLET | Freq: Every day | ORAL | Status: DC
  Administered 2017-10-31: 20 mg via ORAL
  Filled 2017-10-31: qty 2

## 2017-10-31 MED ORDER — ASPIRIN 300 MG RE SUPP: 300.0000 mg | Freq: Every day | RECTAL | Status: DC

## 2017-10-31 MED ORDER — INSULIN ASPART 100 UNIT/ML ~~LOC~~ SOLN
8.0000 [IU] | Freq: Once | SUBCUTANEOUS | Status: AC
  Administered 2017-10-31: 8 [IU] via SUBCUTANEOUS

## 2017-10-31 MED ORDER — SODIUM CHLORIDE 0.9 % IV SOLN
INTRAVENOUS | Status: DC
  Administered 2017-10-31: 07:00:00 via INTRAVENOUS

## 2017-10-31 MED ORDER — BICTEGRAVIR-EMTRICITAB-TENOFOV 50-200-25 MG PO TABS
1.0000 | ORAL_TABLET | Freq: Every day | ORAL | Status: DC
  Administered 2017-10-31 – 2017-11-01 (×2): 1 via ORAL
  Filled 2017-10-31 (×4): qty 30

## 2017-10-31 MED ORDER — STROKE: EARLY STAGES OF RECOVERY BOOK
Freq: Once | Status: DC
  Filled 2017-10-31 (×2): qty 1

## 2017-10-31 MED ORDER — GADOBENATE DIMEGLUMINE 529 MG/ML IV SOLN
20.0000 mL | Freq: Once | INTRAVENOUS | Status: AC | PRN
  Administered 2017-10-31: 20 mL via INTRAVENOUS

## 2017-10-31 MED ORDER — INSULIN ASPART 100 UNIT/ML ~~LOC~~ SOLN
0.0000 [IU] | SUBCUTANEOUS | Status: DC
  Administered 2017-10-31 (×2): 9 [IU] via SUBCUTANEOUS
  Filled 2017-10-31 (×2): qty 1

## 2017-10-31 MED ORDER — ALBUTEROL SULFATE (2.5 MG/3ML) 0.083% IN NEBU: 3.0000 mL | INHALATION_SOLUTION | Freq: Four times a day (QID) | RESPIRATORY_TRACT | Status: DC | PRN

## 2017-10-31 MED ORDER — ASPIRIN EC 81 MG PO TBEC
81.0000 mg | DELAYED_RELEASE_TABLET | Freq: Every day | ORAL | Status: DC
  Administered 2017-11-01: 81 mg via ORAL
  Filled 2017-10-31: qty 1

## 2017-10-31 MED ORDER — INSULIN ASPART 100 UNIT/ML ~~LOC~~ SOLN
0.0000 [IU] | Freq: Three times a day (TID) | SUBCUTANEOUS | Status: DC
  Administered 2017-10-31: 11 [IU] via SUBCUTANEOUS
  Administered 2017-10-31: 8 [IU] via SUBCUTANEOUS
  Administered 2017-11-01: 11 [IU] via SUBCUTANEOUS
  Administered 2017-11-01: 15 [IU] via SUBCUTANEOUS
  Administered 2017-11-01: 11 [IU] via SUBCUTANEOUS
  Filled 2017-10-31: qty 1

## 2017-10-31 MED ORDER — LABETALOL HCL 5 MG/ML IV SOLN: 5.0000 mg | INTRAVENOUS | Status: DC | PRN

## 2017-10-31 MED ORDER — NICOTINE 14 MG/24HR TD PT24
14.0000 mg | MEDICATED_PATCH | Freq: Every day | TRANSDERMAL | Status: DC
  Administered 2017-10-31: 14 mg via TRANSDERMAL
  Filled 2017-10-31 (×2): qty 1

## 2017-10-31 MED ORDER — TOPIRAMATE 25 MG PO TABS
25.0000 mg | ORAL_TABLET | Freq: Every day | ORAL | Status: DC
  Administered 2017-10-31: 25 mg via ORAL
  Filled 2017-10-31: qty 1

## 2017-10-31 MED ORDER — INSULIN ASPART 100 UNIT/ML ~~LOC~~ SOLN: 0.0000 [IU] | Freq: Every day | SUBCUTANEOUS | Status: DC

## 2017-10-31 MED ORDER — ASPIRIN 325 MG PO TABS
325.0000 mg | ORAL_TABLET | Freq: Every day | ORAL | Status: DC
  Administered 2017-10-31: 325 mg via ORAL
  Filled 2017-10-31: qty 1

## 2017-10-31 MED ORDER — ATORVASTATIN CALCIUM 80 MG PO TABS
80.0000 mg | ORAL_TABLET | Freq: Every day | ORAL | Status: DC
  Filled 2017-10-31: qty 1

## 2017-10-31 MED ORDER — PANTOPRAZOLE SODIUM 40 MG PO TBEC
40.0000 mg | DELAYED_RELEASE_TABLET | Freq: Every day | ORAL | Status: DC
  Administered 2017-10-31: 40 mg via ORAL
  Filled 2017-10-31 (×2): qty 1

## 2017-10-31 MED ORDER — ENOXAPARIN SODIUM 40 MG/0.4ML ~~LOC~~ SOLN
40.0000 mg | SUBCUTANEOUS | Status: DC
  Administered 2017-10-31: 40 mg via SUBCUTANEOUS
  Filled 2017-10-31 (×3): qty 0.4

## 2017-10-31 MED ORDER — SENNOSIDES-DOCUSATE SODIUM 8.6-50 MG PO TABS: 1.0000 | ORAL_TABLET | Freq: Every evening | ORAL | Status: DC | PRN

## 2017-10-31 MED ORDER — ATORVASTATIN CALCIUM 10 MG PO TABS
20.0000 mg | ORAL_TABLET | Freq: Every day | ORAL | Status: DC
  Administered 2017-10-31 – 2017-11-01 (×2): 20 mg via ORAL
  Filled 2017-10-31 (×2): qty 2
  Filled 2017-10-31: qty 1
  Filled 2017-10-31: qty 2

## 2017-10-31 MED ORDER — IOPAMIDOL (ISOVUE-370) INJECTION 76%
INTRAVENOUS | Status: AC
  Filled 2017-10-31: qty 50

## 2017-10-31 MED ORDER — IOPAMIDOL (ISOVUE-370) INJECTION 76%
50.0000 mL | Freq: Once | INTRAVENOUS | Status: AC | PRN
  Administered 2017-10-31: 50 mL via INTRAVENOUS

## 2017-10-31 NOTE — ED Provider Notes (Signed)
Patient is a 47 year old who presents today for evaluation of visual field changes, hyperglycemia, and headache.  I assumed care of patient from Dr. Erma Heritage at shift change.  Briefly patient is a MTF with a history of HIV who presents today for evaluation of a visual field defect.  She reports that for the past few weeks in the left side of her vision in both eyes she has had visual disturbances.  She reports that it is a area of blue, red, green colors that spin to the left.  She reports that this then went away after a few days and came back initially as black and white but then progressed to the same colors.  Plan is to follow-up on MRI and migraine cocktail treatment.  MRI was obtained and reviewed.  Patient reports that her symptoms have significantly improved after migraine cocktail.  She is resting comfortably in no obvious distress.  I spoke with Dr. Marrion Coy from neurology who recommended ordering CT angios head and neck, hospitalist admission and states that he will see the patient.  Clinical Course as of Oct 31 501  Mon Oct 30, 2017  2210 Follow up on MRI, If positive then call neuro, if better follow up as outpatient, monitor headache.    [EH]  Tue Oct 31, 2017  0452 Spoke with Dr. Antionette Char who will admit patient.    [EH]    Clinical Course User Index [EH] Norman Clay     Mr Brain W And Wo Contrast  Result Date: 10/31/2017 CLINICAL DATA:  Initial evaluation for left-sided visual field changes for past 2 weeks. EXAM: MRI HEAD WITHOUT AND WITH CONTRAST TECHNIQUE: Multiplanar, multiecho pulse sequences of the brain and surrounding structures were obtained without and with intravenous contrast. CONTRAST:  20mL MULTIHANCE GADOBENATE DIMEGLUMINE 529 MG/ML IV SOLN COMPARISON:  None available. FINDINGS: Brain: Cerebral volume within normal limits. No significant cerebral white matter changes for age. There is a subtle small focus of T2/FLAIR signal abnormality at the right occipital  pole (series 09811, image 11). Associated faint patchy diffusion signal abnormality within this region (series 5001, image 56). Small amount of susceptibility artifact (series 91478, image 23). Associated postcontrast leptomeningeal enhancement (series 29562, image 22). Finding felt to be most consistent with a small subacute cortical infarct, right PCA territory. Infarct measures no more than 13 mm in size. No other evidence for acute or subacute ischemia. Gray-white matter differentiation otherwise maintained. No other areas of chronic infarction. No other acute or chronic intracranial hemorrhage. No mass lesion, midline shift or mass effect. No hydrocephalus. No extra-axial fluid collection. Major dural sinuses grossly patent. No other abnormal enhancement. Pituitary gland and suprasellar region within normal limits. Midline structures intact and normal. Vascular: Major intracranial vascular flow voids are maintained. Skull and upper cervical spine: Craniocervical junction normal. Upper cervical spine within normal limits. Signal intensity within the visualized bone marrow diffusely decreased on T1 weighted imaging, most commonly related to anemia, smoking, obesity, or other chronic disease. No scalp soft tissue abnormality. Sinuses/Orbits: Globes and orbital soft tissues within normal limits. Bilateral maxillary sinus retention cyst noted. Paranasal sinuses are otherwise clear. Bilateral mastoid effusions noted, most likely benign/sterile. Inner ear structures grossly normal. Other: None. IMPRESSION: 1. Small focus of signal abnormality at the right occipital pole as above, most consistent with a small subacute ischemic infarct. 2. Otherwise normal brain MRI. Electronically Signed   By: Rise Mu M.D.   On: 10/31/2017 03:02  Cristina Gong, PA-C 10/31/17 0503    Gilda Crease, MD 11/09/17 [phone]

## 2017-10-31 NOTE — Telephone Encounter (Signed)
Ok I'm glad someone is working her up

## 2017-10-31 NOTE — Progress Notes (Signed)
Patient admitted after midnight. Please see H&P.  Discussed with Dr. Roda Shutters.  Appears to be a complicated migraine.  Topamax being started.  Needs PCP to manage diabetes.  Marlin Canary DO

## 2017-10-31 NOTE — Consult Note (Signed)
Neurology Consultation  Reason for Consult: Visual changes Referring Physician: Dr. Erma Heritage  CC: Vision changes  History is obtained from: Patient, chart  HPI: [name], is a 47 year old transgender male, with a past medical history of uncontrolled diabetes, hypertension, HIV currently on treatment, who was in her usual state of health until Monday, 10/24/2015 when she started noticing that she has what she describes as colored pinwheels in her left visual fields.  She said that this started last Monday, continued till Thursday and then improved.  On Saturday again, she started noticing the same thing but now this was an black and white and not in color.  Denies any concurrent headache.  Does not have a history of migraines now or anytime in the past.  Denies a history of stroke in the past.  Reports full compliance to HIV medications. Denies any chest pain, shortness of breath, nausea and vomiting. Endorses depression but no suicidal ideation or homicidal ideation.  Endorses anxiety. Denies history of heart disease or heart attacks in the past.  Denies family history of strokes.  Reports diabetes in the father. Currently smokes nearly a pack of cigarettes a day.  No illicit drugs.  Social alcohol use. Lives at home with sister. She is not currently on hormonal therapy but does take spironolactone as androgen blocker therapy. Other than that her review of systems, reports a right buttock area of swelling and discoloration, which has been seen by general surgery, and treated as a possible rectal abscess.  LKW: Sometime on 10/23/2017 tpa given?: no, way outside the window for TPA Premorbid modified Rankin scale (mRS): 0  ROS:ROS was performed and is negative except as noted in the HPI.   Past Medical History:  Diagnosis Date  . Diabetes mellitus   . HIV disease (HCC)   . Hypertension   . MRSA carrier   . Perirectal abscess 03/30/2017  . Polyuria 04/27/2015  . Poorly controlled  diabetes mellitus (HCC) 04/27/2015  . Testicular mass 06/20/2016  . Transgender 04/27/2015   No family history on file. Father had diabetes  Social History:   reports that he has been smoking cigarettes.  He has a 27.00 pack-year smoking history. He has never used smokeless tobacco. He reports that he has current or past drug history. Drug: Marijuana. He reports that he does not drink alcohol. Denies alcohol abuse-only social drinking. Positive for smoking-nearly pack a day  Medications No current facility-administered medications for this encounter.   Current Outpatient Medications:  .  albuterol (PROVENTIL HFA;VENTOLIN HFA) 108 (90 BASE) MCG/ACT inhaler, Inhale 2 puffs into the lungs every 6 (six) hours as needed for wheezing or shortness of breath., Disp: 1 Inhaler, Rfl: 6 .  bictegravir-emtricitabine-tenofovir AF (BIKTARVY) 50-200-25 MG TABS tablet, Take 1 tablet by mouth daily., Disp: 30 tablet, Rfl: 11 .  escitalopram (LEXAPRO) 20 MG tablet, Take 1 tablet (20 mg total) by mouth at bedtime., Disp: 30 tablet, Rfl: 5 .  metFORMIN (GLUCOPHAGE) 500 MG tablet, Take 2 tablets (1,000 mg total) by mouth 2 (two) times daily with a meal. Take ONE 500 BID x 2 weeks then 2 tabs BID, Disp: 120 tablet, Rfl: 11 .  omeprazole (PRILOSEC) 20 MG capsule, Take 2 capsules (40 mg total) by mouth at bedtime., Disp: 60 capsule, Rfl: 5 .  spironolactone (ALDACTONE) 50 MG tablet, Take 1 tablet (50 mg total) by mouth daily., Disp: 30 tablet, Rfl: 5  Exam: Current vital signs: BP 127/86   Pulse 65   Temp 98.4 F (  36.9 C) (Oral)   Resp 16   SpO2 92%  Vital signs in last 24 hours: Temp:  [98.4 F (36.9 C)-99.2 F (37.3 C)] 98.4 F (36.9 C) (05/20 2015) Pulse Rate:  [65-105] 65 (05/21 0130) Resp:  [13-18] 16 (05/21 0130) BP: (114-139)/(80-103) 127/86 (05/21 0130) SpO2:  [88 %-96 %] 92 % (05/21 0130) General: Awake alert in no apparent distress HEENT: Normocephalic atraumatic dry mucous membranes no  thyromegaly Lungs: Clear to auscultation Cardia vascular: S1-S2 heard regular rate rhythm Abdomen soft nondistended nontender Extremities: Warm well perfused with intact pulses Neurological exam Mental status: Alert awake oriented x3 Speech is not dysarthric Naming, attention repetition is intact. Cranial nerves: Pupils equal round reactive to light, extra ocular movements intact, visual fields full although notes subjective colorful pinwheels in the left hemifield in both eyes, no hemianopsia, no facial asymmetry, facial sensation intact, hearing intact, tongue uvula and soft palate midline, sternocleidomastoid and trapezius strength is normal. Motor exam: 5/5 strength in all 4 extremities with normal tone and range of motion Sensory exam: Intact to light touch.  No extinction. Coordination: Finger-to-nose intact bilaterally Gait was deferred NIHSS-0   Labs I have reviewed labs in epic and the results pertinent to this consultation are:  CBC    Component Value Date/Time   WBC 13.3 (H) 10/30/2017 1427   RBC 5.75 10/30/2017 1427   HGB 18.2 (H) 10/30/2017 1427   HCT 51.7 10/30/2017 1427   PLT 302 10/30/2017 1427   MCV 89.9 10/30/2017 1427   MCH 31.7 10/30/2017 1427   MCHC 35.2 10/30/2017 1427   RDW 11.9 10/30/2017 1427   LYMPHSABS 4.1 (H) 10/30/2017 1427   MONOABS 1.2 (H) 10/30/2017 1427   EOSABS 0.2 10/30/2017 1427   EOSABS 0.1 K/UL 03/29/2006 1428   BASOSABS 0.1 10/30/2017 1427    CMP     Component Value Date/Time   NA 134 (L) 10/30/2017 1427   K 4.1 10/30/2017 1427   CL 100 (L) 10/30/2017 1427   CO2 24 10/30/2017 1427   GLUCOSE 380 (H) 10/30/2017 1427   BUN 11 10/30/2017 1427   CREATININE 0.77 10/30/2017 1427   CREATININE 0.76 03/27/2017 1020   CALCIUM 9.3 10/30/2017 1427   PROT 7.5 10/30/2017 1427   ALBUMIN 3.9 10/30/2017 1427   AST 21 10/30/2017 1427   ALT 29 10/30/2017 1427   ALKPHOS 99 10/30/2017 1427   BILITOT 0.8 10/30/2017 1427   GFRNONAA >60  10/30/2017 1427   GFRNONAA >60 08/31/2010 1235   GFRAA >60 10/30/2017 1427   GFRAA >60 08/31/2010 1235   Imaging I have reviewed the images obtained: MRI examination of the brain-small focus of signal abnormality in the right occipital lobe most consistent with a small subacute ischemic infarct.  Otherwise normal brain MRI.  Assessment:  47 year old transgender male, with a past history of uncontrolled diabetes, hypertension, HIV on treatment with antiretroviral, presenting for evaluation of at least 1 week worth of visual field changes described above. MRI suggestive of a small right occipital lobe subacute ischemic infarct. Other possibilities include HIV encephalitis although low on differentials. Needs stroke work-up at this time. Said the window for IV TPA.  No findings of LVO.  Impression: Acute ischemic stroke involving the right occipital lobe- etiology under investigation Possible etiologies could include the usual culprits such as uncontrolled diabetes and hypertension but also consideration should be given to atypical factors in her case because of HIV.  Etiologies could include HIV encephalitis.  Or HIV vasculopathy.  Recommendations: -Admit  to hospitalist -Telemetry monitoring -Allow for permissive hypertension for the first 24-48h - only treat PRN if SBP >220 mmHg. Blood pressures can be gradually normalized to SBP<140 upon discharge. -CT Angiogram of Head and neck -Echocardiogram -HgbA1c, fasting lipid panel -Frequent neuro checks -Prophylactic therapy-Antiplatelet med: Aspirin - dose  PO or  PR -Atorvastatin 80 mg PO daily -Risk factor modification -I discussed the importance of exercise as well as smoking/alcohol/illicit drug use cessation at great lengths with the patient and her sister at bedside. -PT consult, OT consult, Speech consult - I will defer the decision on LP for HIV testing for possible HIV encephalitis to the stroke team after the vessel  studies become available. -Management of HIV per primary team and infectious diseases -Patient sees ID as an outpatient but does not have a primary care physician for diabetes control.  Will need to be hooked up to a primary for better control of the diabetes.  Will need diabetes education as well.  Please page stroke NP/PA/MD (listed on AMION)  from 8am-4 pm as this patient will be followed by the stroke team at this point.  -- Milon Dikes, MD [facility] Pager: [phone] If 7pm to 7am, please call on call as listed on AMION.

## 2017-10-31 NOTE — Discharge Planning (Signed)
EDCM consulted to assist with PCP establishment.  EDCM confirmed that pt has Medicaid insurance with assigned PCP [facility]).  EDCM contacted [facility] to find that pt must call practice manager and put on wait list for appointment. EDCM contacted [facility] to get next available appointment and placed it on AVS.

## 2017-10-31 NOTE — ED Notes (Signed)
Updated family and pt on MRI wait time

## 2017-10-31 NOTE — Progress Notes (Signed)
STROKE TEAM PROGRESS NOTE   SUBJECTIVE (INTERVAL HISTORY) Her girlfriend is at the bedside.  Overall he feels his condition is completely resolved. She stated that started 2 months ago, she had 2 days on and off episodes of b/l eye left visual field round shape with spinning to left and 3 different colors on 3 sections. No HA at that time. One week ago, she had again same visual disturbance lasting hours, but also with HA throbbing behind both eyes. Since then, it happened on and off, lasting longer and longer and with HA. She came to ED last Friday but left AMA due to long waiting time. Over the weekend, still on and off episodes, and the color became finger shaped black and white imaging intermixed with color shaped imaging. Pt came to ER yesterday. MRI reported right occipital punctate infarcts, but I can not appreciate any abnormalities. She denies fever, confusion, numbness or weakness, neck pain.    OBJECTIVE Temp:  [98.4 F (36.9 C)] 98.4 F (36.9 C) (05/20 2015) Pulse Rate:  [65-105] 85 (05/21 1400) Resp:  [13-22] 18 (05/21 1400) BP: (114-136)/(71-96) 129/84 (05/21 1400) SpO2:  [88 %-99 %] 98 % (05/21 1400)  Recent Labs  Lab 10/30/17 1416 10/30/17 1717 10/31/17 0544 10/31/17 0741  GLUCAP 356* 324* 450* 403*   Recent Labs  Lab 10/30/17 1427  NA 134*  K 4.1  CL 100*  CO2 24  GLUCOSE 380*  BUN 11  CREATININE 0.77  CALCIUM 9.3   Recent Labs  Lab 10/30/17 1427  AST 21  ALT 29  ALKPHOS 99  BILITOT 0.8  PROT 7.5  ALBUMIN 3.9   Recent Labs  Lab 10/30/17 1427  WBC 13.3*  NEUTROABS 7.6  HGB 18.2*  HCT 51.7  MCV 89.9  PLT 302   No results for input(s): CKTOTAL, CKMB, CKMBINDEX, TROPONINI in the last 168 hours. No results for input(s): LABPROT, INR in the last 72 hours. Recent Labs    10/30/17 2207  COLORURINE YELLOW  LABSPEC 1.042*  PHURINE 5.0  GLUCOSEU >=500*  HGBUR NEGATIVE  BILIRUBINUR NEGATIVE  KETONESUR 5*  PROTEINUR 100*  NITRITE NEGATIVE   LEUKOCYTESUR NEGATIVE       Component Value Date/Time   CHOL 190 03/27/2017 1020   TRIG 273 (H) 03/27/2017 1020   HDL 36 (L) 03/27/2017 1020   CHOLHDL 5.3 (H) 03/27/2017 1020   VLDL 60 (H) 04/26/2016 1115   LDLCALC 115 (H) 03/27/2017 1020   Lab Results  Component Value Date   HGBA1C 11.9 (H) 03/27/2017      Component Value Date/Time   LABOPIA POS (A) 09/02/2014 1204   COCAINSCRNUR NEG 09/02/2014 1204   LABBENZ NEG 09/02/2014 1204   AMPHETMU NEG 09/02/2014 1204   AMPHETMU NONE DETECTED 09/09/2007 2210   THCU POSITIVE (A) 09/09/2007 2210   LABBARB  09/09/2007 2210    NONE DETECTED        DRUG SCREEN FOR MEDICAL PURPOSES ONLY.  IF CONFIRMATION IS NEEDED FOR ANY PURPOSE, NOTIFY LAB WITHIN 5 DAYS.    No results for input(s): ETH in the last 168 hours.  I have personally reviewed the radiological images below and agree with the radiology interpretations.  Ct Angio Head W Or Wo Contrast  Result Date: 10/31/2017 CLINICAL DATA:  Initial evaluation for acute stroke. EXAM: CT ANGIOGRAPHY HEAD AND NECK TECHNIQUE: Multidetector CT imaging of the head and neck was performed using the standard protocol during bolus administration of intravenous contrast. Multiplanar CT image reconstructions and MIPs were  obtained to evaluate the vascular anatomy. Carotid stenosis measurements (when applicable) are obtained utilizing NASCET criteria, using the distal internal carotid diameter as the denominator. CONTRAST:  50mL ISOVUE-370 IOPAMIDOL (ISOVUE-370) INJECTION 76% COMPARISON:  Prior brain MRI from earlier same day. FINDINGS: CT HEAD FINDINGS Brain: Previously noted small subacute infarct at the right occipital pole not well seen. No acute intracranial hemorrhage. No other acute large vessel territory infarct. No mass lesion, midline shift or mass effect. No hydrocephalus. No extra-axial fluid collection. Vascular: No asymmetric hyperdense vessel. Skull: Scalp soft tissues and calvarium within normal  limits. Sinuses: Left maxillary sinus retention cysts.  Otherwise clear. Orbits: Globes and orbital soft tissues within normal limits. Review of the MIP images confirms the above findings CTA NECK FINDINGS Aortic arch: Visualized aortic arch of normal caliber with normal branch pattern. No hemodynamically significant stenosis at the origin of the great vessels. Focal mixed plaque noted at the origin of the left subclavian artery. Visualized subclavian arteries widely patent. Right carotid system: Atheromatous irregularity throughout the right common carotid artery without significant stenosis. Mixed plaque about the right bifurcation/proximal right ICA with associated stenosis of approximately 40-50% by NASCET criteria. Right ICA otherwise patent to the skull base without stenosis, dissection, or occlusion. Left carotid system: A centric noncalcified plaque within the left common carotid artery without hemodynamically significant stenosis. Mixed plaque about the left bifurcation/proximal left ICA with relatively mild narrowing of approximately 35-40% by NASCET criteria. Left ICA patent distally to the skull base without stenosis, dissection, or occlusion. Vertebral arteries: Both of the vertebral arteries arise from the subclavian arteries. Mild diffuse atheromatous irregularity within the vertebral arteries bilaterally without hemodynamically significant stenosis. No evidence for dissection or vascular occlusion. Skeleton: No acute osseous abnormality. No worrisome lytic or blastic osseous lesions. Moderate spondylolysis noted at C5-6. Other neck: No acute soft tissue abnormality within the neck. Postsurgical changes noted within the left submandibular space related to prior left submandibular gland resection. No adenopathy. Thyroid normal. Upper chest: Visualized upper chest unremarkable. Partially visualized lungs are clear. Review of the MIP images confirms the above findings CTA HEAD FINDINGS Anterior  circulation: Petrous, cavernous, and supraclinoid segments patent bilaterally without hemodynamically significant stenosis. Mild plaque within the cavernous left ICA. A1 segments, anterior communicating artery common anterior cerebral arteries patent without stenosis. No M1 stenosis or occlusion. Normal MCA bifurcations. No proximal M2 occlusion. Distal MCA branches well perfused and symmetric. Posterior circulation: Vertebral arteries patent to the vertebrobasilar junction without hemodynamically significant stenosis. Mild symmetric irregularity within the vertebral arteries bilaterally favored to be artifactual. Left vertebral artery dominant. Posterior inferior cerebral arteries patent bilaterally. Basilar patent to its distal aspect without stenosis. Superior cerebral arteries patent bilaterally. Both of the posterior cerebral artery supplied via the basilar and are widely patent to their distal aspects. Venous sinuses: Grossly patent, although not well evaluated due to arterial timing the contrast bolus. Anatomic variants: None significant. No aneurysm or other vascular abnormality. Delayed phase: No appreciable abnormal enhancement. Review of the MIP images confirms the above findings IMPRESSION: 1. Negative CTA with no large vessel occlusion identified. 2. 50% atheromatous stenosis at the proximal right ICA. 3. Mild atheromatous narrowing at the proximal left ICA with stenosis of up to 35-40% by NASCET criteria. 4. Widely patent vertebral arteries within the neck. 5. No hemodynamically significant or correctable stenosis within the intracranial circulation. Electronically Signed   By: Rise Mu M.D.   On: 10/31/2017 06:58   Ct Angio Neck W And/or Wo Contrast  Result Date: 10/31/2017 CLINICAL DATA:  Initial evaluation for acute stroke. EXAM: CT ANGIOGRAPHY HEAD AND NECK TECHNIQUE: Multidetector CT imaging of the head and neck was performed using the standard protocol during bolus administration  of intravenous contrast. Multiplanar CT image reconstructions and MIPs were obtained to evaluate the vascular anatomy. Carotid stenosis measurements (when applicable) are obtained utilizing NASCET criteria, using the distal internal carotid diameter as the denominator. CONTRAST:  50mL ISOVUE-370 IOPAMIDOL (ISOVUE-370) INJECTION 76% COMPARISON:  Prior brain MRI from earlier same day. FINDINGS: CT HEAD FINDINGS Brain: Previously noted small subacute infarct at the right occipital pole not well seen. No acute intracranial hemorrhage. No other acute large vessel territory infarct. No mass lesion, midline shift or mass effect. No hydrocephalus. No extra-axial fluid collection. Vascular: No asymmetric hyperdense vessel. Skull: Scalp soft tissues and calvarium within normal limits. Sinuses: Left maxillary sinus retention cysts.  Otherwise clear. Orbits: Globes and orbital soft tissues within normal limits. Review of the MIP images confirms the above findings CTA NECK FINDINGS Aortic arch: Visualized aortic arch of normal caliber with normal branch pattern. No hemodynamically significant stenosis at the origin of the great vessels. Focal mixed plaque noted at the origin of the left subclavian artery. Visualized subclavian arteries widely patent. Right carotid system: Atheromatous irregularity throughout the right common carotid artery without significant stenosis. Mixed plaque about the right bifurcation/proximal right ICA with associated stenosis of approximately 40-50% by NASCET criteria. Right ICA otherwise patent to the skull base without stenosis, dissection, or occlusion. Left carotid system: A centric noncalcified plaque within the left common carotid artery without hemodynamically significant stenosis. Mixed plaque about the left bifurcation/proximal left ICA with relatively mild narrowing of approximately 35-40% by NASCET criteria. Left ICA patent distally to the skull base without stenosis, dissection, or  occlusion. Vertebral arteries: Both of the vertebral arteries arise from the subclavian arteries. Mild diffuse atheromatous irregularity within the vertebral arteries bilaterally without hemodynamically significant stenosis. No evidence for dissection or vascular occlusion. Skeleton: No acute osseous abnormality. No worrisome lytic or blastic osseous lesions. Moderate spondylolysis noted at C5-6. Other neck: No acute soft tissue abnormality within the neck. Postsurgical changes noted within the left submandibular space related to prior left submandibular gland resection. No adenopathy. Thyroid normal. Upper chest: Visualized upper chest unremarkable. Partially visualized lungs are clear. Review of the MIP images confirms the above findings CTA HEAD FINDINGS Anterior circulation: Petrous, cavernous, and supraclinoid segments patent bilaterally without hemodynamically significant stenosis. Mild plaque within the cavernous left ICA. A1 segments, anterior communicating artery common anterior cerebral arteries patent without stenosis. No M1 stenosis or occlusion. Normal MCA bifurcations. No proximal M2 occlusion. Distal MCA branches well perfused and symmetric. Posterior circulation: Vertebral arteries patent to the vertebrobasilar junction without hemodynamically significant stenosis. Mild symmetric irregularity within the vertebral arteries bilaterally favored to be artifactual. Left vertebral artery dominant. Posterior inferior cerebral arteries patent bilaterally. Basilar patent to its distal aspect without stenosis. Superior cerebral arteries patent bilaterally. Both of the posterior cerebral artery supplied via the basilar and are widely patent to their distal aspects. Venous sinuses: Grossly patent, although not well evaluated due to arterial timing the contrast bolus. Anatomic variants: None significant. No aneurysm or other vascular abnormality. Delayed phase: No appreciable abnormal enhancement. Review of the  MIP images confirms the above findings IMPRESSION: 1. Negative CTA with no large vessel occlusion identified. 2. 50% atheromatous stenosis at the proximal right ICA. 3. Mild atheromatous narrowing at the proximal left ICA with stenosis of up to  35-40% by NASCET criteria. 4. Widely patent vertebral arteries within the neck. 5. No hemodynamically significant or correctable stenosis within the intracranial circulation. Electronically Signed   By: Rise Mu M.D.   On: 10/31/2017 06:58   Mr Brain W And Wo Contrast  Result Date: 10/31/2017 CLINICAL DATA:  Initial evaluation for left-sided visual field changes for past 2 weeks. EXAM: MRI HEAD WITHOUT AND WITH CONTRAST TECHNIQUE: Multiplanar, multiecho pulse sequences of the brain and surrounding structures were obtained without and with intravenous contrast. CONTRAST:  20mL MULTIHANCE GADOBENATE DIMEGLUMINE 529 MG/ML IV SOLN COMPARISON:  None available. FINDINGS: Brain: Cerebral volume within normal limits. No significant cerebral white matter changes for age. There is a subtle small focus of T2/FLAIR signal abnormality at the right occipital pole (series 57846, image 11). Associated faint patchy diffusion signal abnormality within this region (series [id number], image 56). Small amount of susceptibility artifact (series 96295, image 23). Associated postcontrast leptomeningeal enhancement (series 28413, image 22). Finding felt to be most consistent with a small subacute cortical infarct, right PCA territory. Infarct measures no more than 13 mm in size. No other evidence for acute or subacute ischemia. Gray-white matter differentiation otherwise maintained. No other areas of chronic infarction. No other acute or chronic intracranial hemorrhage. No mass lesion, midline shift or mass effect. No hydrocephalus. No extra-axial fluid collection. Major dural sinuses grossly patent. No other abnormal enhancement. Pituitary gland and suprasellar region within normal limits.  Midline structures intact and normal. Vascular: Major intracranial vascular flow voids are maintained. Skull and upper cervical spine: Craniocervical junction normal. Upper cervical spine within normal limits. Signal intensity within the visualized bone marrow diffusely decreased on T1 weighted imaging, most commonly related to anemia, smoking, obesity, or other chronic disease. No scalp soft tissue abnormality. Sinuses/Orbits: Globes and orbital soft tissues within normal limits. Bilateral maxillary sinus retention cyst noted. Paranasal sinuses are otherwise clear. Bilateral mastoid effusions noted, most likely benign/sterile. Inner ear structures grossly normal. Other: None. IMPRESSION: 1. Small focus of signal abnormality at the right occipital pole as above, most consistent with a small subacute ischemic infarct. 2. Otherwise normal brain MRI. Electronically Signed   By: Rise Mu M.D.   On: 10/31/2017 03:02  however, by my reading, there is no acute infarct on MRI.   TTE pending   PHYSICAL EXAM  Temp:  [98.4 F (36.9 C)] 98.4 F (36.9 C) (05/20 2015) Pulse Rate:  [65-105] 85 (05/21 1400) Resp:  [13-22] 18 (05/21 1400) BP: (114-136)/(71-96) 129/84 (05/21 1400) SpO2:  [88 %-99 %] 98 % (05/21 1400)  General - Well nourished, well developed, in no apparent distress.  Ophthalmologic - fundi not visualized due to noncooperation.  Cardiovascular - Regular rate and rhythm.  Neck - neck supple, no meningismus   Mental Status -  Level of arousal and orientation to time, place, and person were intact. Language including expression, naming, repetition, comprehension was assessed and found intact. Attention span and concentration were normal. Fund of Knowledge was assessed and was intact.  Cranial Nerves II - XII - II - Visual field intact OU. III, IV, VI - Extraocular movements intact. V - Facial sensation intact bilaterally. VII - Facial movement intact bilaterally. VIII -  Hearing & vestibular intact bilaterally. X - Palate elevates symmetrically. XI - Chin turning & shoulder shrug intact bilaterally. XII - Tongue protrusion intact.  Motor Strength - The patient's strength was normal in all extremities and pronator drift was absent.  Bulk was normal and fasciculations were absent.  Motor Tone - Muscle tone was assessed at the neck and appendages and was normal.  Reflexes - The patient's reflexes were symmetrical in all extremities and he had no pathological reflexes.  Sensory - Light touch, temperature/pinprick were assessed and were symmetrical.    Coordination - The patient had normal movements in the hands and feet with no ataxia or dysmetria.  Tremor was absent.  Gait and Station - deferred.   ASSESSMENT/PLAN [name] is a 47 y.o. transgender male with history of DM, HTN, HIV, perirectal abscess, smoker admitted for intermittent visual disturbance with HA for 2 months.    Complicated migraine with visual aura  Resultant intermittent left visual field visual aura with HA  MRI reported right occipital punctate infarcts, however, by my reading, there is no acute infarct on MRI  CTA head and neck - mild b/l ICA proximal stenosis, < 40%  2D Echo  pending  LDL pending  HgbA1c pending  lovenox for VTE prophylaxis  No antithrombotic prior to admission, now on aspirin 81 mg daily. Continue ASA 81mg  for stroke prevention given stroke risk factors.  Recommend topamax 25mg  Qhs for 7 days and then 25mg  bid.   Patient counseled to be compliant with his antithrombotic medications  Ongoing aggressive stroke risk factor management  Therapy recommendations:  Pending   Disposition:  Pending   Chronic leukocytosis  WBC 13.3 this admission  Pt WBC around 11.6-15.4 range since 2015  Pt denies fever, neck pain, confusion, weakness or numbness  Neck supple no meningismus  No evidence of CNS infection  HIV  Pt stated  compliance  Followed with ID  No evidence of CNS infection at this time  Do not think LP needed.  Diabetes  HgbA1c pending goal < 7.0  Uncontrolled  hyperglycemia  CBG monitoring  SSI  DM education and close PCP follow up  Tobacco abuse  Current smoker  Smoking cessation counseling provided  Pt is willing to quit  Hypertension Stable  Long term BP goal normotensive  Hyperlipidemia  Home meds:  none   LDL pending, goal < 70  Now on lipitor 20  Continue statin at discharge  Other Active Problems  Transgender male  ? Perirectal abscess  Hospital day # 0  Neurology will sign off. Please call with questions. Pt will follow up with stroke clinic NP at [facility] in about 4 weeks. Thanks for the consult.   Marvel Plan, MD PhD Stroke Neurology 10/31/2017 2:13 PM    To contact Stroke Continuity provider, please refer to [email or web address]. After hours, contact General Neurology

## 2017-10-31 NOTE — ED Notes (Signed)
Carb Modified Diet was ordered for Breakfast. 

## 2017-10-31 NOTE — ED Notes (Signed)
Carb Modified Diet was ordered for Lunch. 

## 2017-10-31 NOTE — ED Notes (Signed)
Admitting paged to RN [facility] regarding disposition.

## 2017-10-31 NOTE — Telephone Encounter (Signed)
[name], verified identity.  [name] states she was currently in [facility] Emergency room with c/o of blurred vision, seeing colorful pinwheels and constant headache.  Writer asked if she smokes and patient confirmed that she does. Angeline Slim RN

## 2017-10-31 NOTE — ED Notes (Signed)
Patient transported to CT 

## 2017-10-31 NOTE — ED Notes (Signed)
Admitting MD at bedside.

## 2017-10-31 NOTE — H&P (Signed)
History and Physical    [name]:[id number] DOB: [birth year] DOA: 10/30/2017  PCP: Patient, No Pcp Per   Patient coming from: Home  Chief Complaint: Vision disturbance   HPI: [name] is a 47 y.o. male with medical history significant for gender dysphoria, HIV, uncontrolled type 2 diabetes mellitus, and hypertension, now presenting to the emergency department for evaluation of visual disturbance for the past week or so.  Patient reports a pressure sensation behind both eyes, mild headache, and visual disturbances over the past week or more.  Denies fall or trauma, denies fevers or chills, and denies focal numbness or weakness.  Never experienced these symptoms previously.  ED Course: Upon arrival to the ED, patient is found to be afebrile, saturating adequately on room air, and with vitals otherwise stable.  EKG features a sinus rhythm and chemistry panel is notable for a sugar of 380.  CBC features a leukocytosis to 13,300 and hemoglobin 18.2.  Urinalysis is notable for glucosuria and elevated specific gravity.  MRI reveals a small focus of abnormality at the right occiput concerning for small subacute ischemic CVA.  Neurology was consulted by the ED physician and recommended medical admission for further evaluation and management.  The patient was given a liter normal saline, Decadron, Benadryl, insulin, Depakote, Toradol, meclizine, Compazine, Reglan, and DuoNeb in the ED.  Review of Systems:  All other systems reviewed and apart from HPI, are negative.  Past Medical History:  Diagnosis Date  . Diabetes mellitus   . HIV disease (HCC)   . Hypertension   . MRSA carrier   . Perirectal abscess 03/30/2017  . Polyuria 04/27/2015  . Poorly controlled diabetes mellitus (HCC) 04/27/2015  . Testicular mass 06/20/2016  . Transgender 04/27/2015    Past Surgical History:  Procedure Laterality Date  . APPENDECTOMY       reports that he has been smoking cigarettes.  He has a  27.00 pack-year smoking history. He has never used smokeless tobacco. He reports that he has current or past drug history. Drug: Marijuana. He reports that he does not drink alcohol.  Allergies  Allergen Reactions  . Propoxyphene N-Acetaminophen Other (See Comments)    Stomach cramps    History reviewed. No pertinent family history.   Prior to Admission medications   Medication Sig Start Date End Date Taking? Authorizing Provider  albuterol (PROVENTIL HFA;VENTOLIN HFA) 108 (90 BASE) MCG/ACT inhaler Inhale 2 puffs into the lungs every 6 (six) hours as needed for wheezing or shortness of breath. 04/27/15  Yes [name], Lisette Grinder, MD  bictegravir-emtricitabine-tenofovir AF (BIKTARVY) 50-200-25 MG TABS tablet Take 1 tablet by mouth daily. 03/30/17  Yes [name], Lisette Grinder, MD  escitalopram (LEXAPRO) 20 MG tablet Take 1 tablet (20 mg total) by mouth at bedtime. 08/18/17  Yes Kuppelweiser, Cassie L, RPH-CPP  metFORMIN (GLUCOPHAGE) 500 MG tablet Take 2 tablets (1,000 mg total) by mouth 2 (two) times daily with a meal. Take ONE 500 BID x 2 weeks then 2 tabs BID 03/30/17  Yes [name], Lisette Grinder, MD  omeprazole (PRILOSEC) 20 MG capsule Take 2 capsules (40 mg total) by mouth at bedtime. 12/06/16  Yes [name], Lisette Grinder, MD  spironolactone (ALDACTONE) 50 MG tablet Take 1 tablet (50 mg total) by mouth daily. 08/18/17  Yes Kuppelweiser, Cassie L, RPH-CPP    Physical Exam: Vitals:   10/31/17 0115 10/31/17 0130 10/31/17 0145 10/31/17 0500  BP: 127/84 127/86 127/88 115/71  Pulse: 66 65 74 78  Resp: 17 16 (!) 22 18  Temp:      TempSrc:      SpO2: 92% 92% (!) 88% 99%      Constitutional: NAD, calm  Eyes: PERTLA, lids and conjunctivae normal ENMT: Mucous membranes are moist. Posterior pharynx clear of any exudate or lesions.   Neck: normal, supple, no masses, no thyromegaly Respiratory: clear to auscultation bilaterally, no wheezing, no crackles. Normal respiratory effort.    Cardiovascular: S1 &  S2 heard, regular rate and rhythm. No significant JVD. Abdomen: No distension, no tenderness, soft. Bowel sounds normal.  Musculoskeletal: no clubbing / cyanosis. No joint deformity upper and lower extremities.   Skin: no significant rashes, lesions, ulcers. Warm, dry, well-perfused. Neurologic: No facial asymmetry. Sensation to light touch intact, patellar DTR normal. Strength 5/5 in all 4 limbs.  Psychiatric: Alert and oriented x 3. Calm, cooperative.     Labs on Admission: I have personally reviewed following labs and imaging studies  CBC: Recent Labs  Lab 10/30/17 1427  WBC 13.3*  NEUTROABS 7.6  HGB 18.2*  HCT 51.7  MCV 89.9  PLT 302   Basic Metabolic Panel: Recent Labs  Lab 10/30/17 1427  NA 134*  K 4.1  CL 100*  CO2 24  GLUCOSE 380*  BUN 11  CREATININE 0.77  CALCIUM 9.3   GFR: CrCl cannot be calculated (Unknown ideal weight.). Liver Function Tests: Recent Labs  Lab 10/30/17 1427  AST 21  ALT 29  ALKPHOS 99  BILITOT 0.8  PROT 7.5  ALBUMIN 3.9   No results for input(s): LIPASE, AMYLASE in the last 168 hours. No results for input(s): AMMONIA in the last 168 hours. Coagulation Profile: No results for input(s): INR, PROTIME in the last 168 hours. Cardiac Enzymes: No results for input(s): CKTOTAL, CKMB, CKMBINDEX, TROPONINI in the last 168 hours. BNP (last 3 results) No results for input(s): PROBNP in the last 8760 hours. HbA1C: No results for input(s): HGBA1C in the last 72 hours. CBG: Recent Labs  Lab 10/30/17 1416 10/30/17 1717  GLUCAP 356* 324*   Lipid Profile: No results for input(s): CHOL, HDL, LDLCALC, TRIG, CHOLHDL, LDLDIRECT in the last 72 hours. Thyroid Function Tests: No results for input(s): TSH, T4TOTAL, FREET4, T3FREE, THYROIDAB in the last 72 hours. Anemia Panel: No results for input(s): VITAMINB12, FOLATE, FERRITIN, TIBC, IRON, RETICCTPCT in the last 72 hours. Urine analysis:    Component Value Date/Time   COLORURINE YELLOW  10/30/2017 2207   APPEARANCEUR HAZY (A) 10/30/2017 2207   LABSPEC 1.042 (H) 10/30/2017 2207   PHURINE 5.0 10/30/2017 2207   GLUCOSEU >=500 (A) 10/30/2017 2207   GLUCOSEU NEG mg/dL 40/98/1191 4782   HGBUR NEGATIVE 10/30/2017 2207   HGBUR negative 11/04/2008 0900   BILIRUBINUR NEGATIVE 10/30/2017 2207   BILIRUBINUR negative 04/28/2015 1721   KETONESUR 5 (A) 10/30/2017 2207   PROTEINUR 100 (A) 10/30/2017 2207   UROBILINOGEN 0.2 04/28/2015 1721   UROBILINOGEN 0.2 12/11/2012 1702   NITRITE NEGATIVE 10/30/2017 2207   LEUKOCYTESUR NEGATIVE 10/30/2017 2207   Sepsis Labs: (procalcitonin:4,lacticidven:4) )No results found for this or any previous visit (from the past 240 hour(s)).   Radiological Exams on Admission: Mr Laqueta Jean And Wo Contrast  Result Date: 10/31/2017 CLINICAL DATA:  Initial evaluation for left-sided visual field changes for past 2 weeks. EXAM: MRI HEAD WITHOUT AND WITH CONTRAST TECHNIQUE: Multiplanar, multiecho pulse sequences of the brain and surrounding structures were obtained without and with intravenous contrast. CONTRAST:  20mL MULTIHANCE GADOBENATE DIMEGLUMINE 529 MG/ML IV  SOLN COMPARISON:  None available. FINDINGS: Brain: Cerebral volume within normal limits. No significant cerebral white matter changes for age. There is a subtle small focus of T2/FLAIR signal abnormality at the right occipital pole (series 16109, image 11). Associated faint patchy diffusion signal abnormality within this region (series 5001, image 56). Small amount of susceptibility artifact (series 60454, image 23). Associated postcontrast leptomeningeal enhancement (series 09811, image 22). Finding felt to be most consistent with a small subacute cortical infarct, right PCA territory. Infarct measures no more than 13 mm in size. No other evidence for acute or subacute ischemia. Gray-white matter differentiation otherwise maintained. No other areas of chronic infarction. No other acute or chronic  intracranial hemorrhage. No mass lesion, midline shift or mass effect. No hydrocephalus. No extra-axial fluid collection. Major dural sinuses grossly patent. No other abnormal enhancement. Pituitary gland and suprasellar region within normal limits. Midline structures intact and normal. Vascular: Major intracranial vascular flow voids are maintained. Skull and upper cervical spine: Craniocervical junction normal. Upper cervical spine within normal limits. Signal intensity within the visualized bone marrow diffusely decreased on T1 weighted imaging, most commonly related to anemia, smoking, obesity, or other chronic disease. No scalp soft tissue abnormality. Sinuses/Orbits: Globes and orbital soft tissues within normal limits. Bilateral maxillary sinus retention cyst noted. Paranasal sinuses are otherwise clear. Bilateral mastoid effusions noted, most likely benign/sterile. Inner ear structures grossly normal. Other: None. IMPRESSION: 1. Small focus of signal abnormality at the right occipital pole as above, most consistent with a small subacute ischemic infarct. 2. Otherwise normal brain MRI. Electronically Signed   By: Rise Mu M.D.   On: 10/31/2017 03:02    EKG: Independently reviewed. Sinus rhythm.   Assessment/Plan  1. Ischemic CVA  - Presents with ~1 wk of visual disturbances  - MRI findings consistent with small subacute ischemic infarct involving occiput  - Neuro is consulting and much appreciated  - Check CTA head and neck, echocardiogram, fasting lipids, A1c  - Continue cardiac monitoring with frequent neuro checks, start ASA and Lipitor 80, consult with PT/OT/SLP    2. HIV - CD4 was 1964 last October and VL was 76 in March 2019   - Continue Biktarvy   3. Uncontrolled type II DM  - A1c was 11.9% last October  - Serum glucose 380 on admission  - Managed with metformin only at home, held at admission   - Monitor CBG's and start SSI with Novolog  - Discussed establishing with  a PCP for closer monitoring and better glycemic control    4. Depression with anxiety  - Continue Lexapro    5. Hypertension  - BP at goal  - Hold Aldactone and permit HTN up to SBP of 220 initially    DVT prophylaxis: Lovenox Code Status: Full  Family Communication: Discussed with patient Consults called: Neurology Admission status: Observation     Briscoe Deutscher, MD [facility] Pager [phone]  If 7PM-7AM, please contact night-coverage [email or web address] Password [email or web address]  10/31/2017, 5:08 AM

## 2017-10-31 NOTE — ED Notes (Signed)
ED Provider at bedside. 

## 2017-10-31 NOTE — ED Notes (Signed)
Patient transported to MRI 

## 2017-10-31 NOTE — ED Notes (Signed)
Pt wanted to go smoke a cigarette. Pt advised that she should stay in room and would not be able to come back into room if she left the ER. Pt walked out of room with sister.

## 2017-10-31 NOTE — Telephone Encounter (Signed)
-----   Message from Randall Hiss, MD sent at 10/30/2017  5:02 PM EDT ----- [name]'s hemoblobin is super high at 17 now. I she smoking?

## 2017-11-01 DIAGNOSIS — E1165 Type 2 diabetes mellitus with hyperglycemia: Secondary | ICD-10-CM | POA: Diagnosis not present

## 2017-11-01 DIAGNOSIS — R930 Abnormal findings on diagnostic imaging of skull and head, not elsewhere classified: Secondary | ICD-10-CM

## 2017-11-01 DIAGNOSIS — B20 Human immunodeficiency virus [HIV] disease: Secondary | ICD-10-CM | POA: Diagnosis not present

## 2017-11-01 DIAGNOSIS — G43109 Migraine with aura, not intractable, without status migrainosus: Secondary | ICD-10-CM | POA: Diagnosis not present

## 2017-11-01 DIAGNOSIS — E669 Obesity, unspecified: Secondary | ICD-10-CM | POA: Diagnosis not present

## 2017-11-01 DIAGNOSIS — E1169 Type 2 diabetes mellitus with other specified complication: Secondary | ICD-10-CM | POA: Diagnosis not present

## 2017-11-01 LAB — GLUCOSE, CAPILLARY
GLUCOSE-CAPILLARY: 303 mg/dL — AB (ref 65–99)
GLUCOSE-CAPILLARY: 386 mg/dL — AB (ref 65–99)
Glucose-Capillary: 304 mg/dL — ABNORMAL HIGH (ref 65–99)
Glucose-Capillary: 379 mg/dL — ABNORMAL HIGH (ref 65–99)

## 2017-11-01 LAB — LIPID PANEL
CHOLESTEROL: 207 mg/dL — AB (ref 0–200)
HDL: 31 mg/dL — ABNORMAL LOW (ref >40)
LDL Cholesterol: 129 mg/dL — ABNORMAL HIGH (ref 0–99)
TRIGLYCERIDES: 236 mg/dL — AB (ref <150)
Total CHOL/HDL Ratio: 6.7 RATIO
VLDL: 47 mg/dL — ABNORMAL HIGH (ref 0–40)

## 2017-11-01 LAB — CBC
HCT: 44 % (ref 39.0–52.0)
HEMOGLOBIN: 15.3 g/dL (ref 13.0–17.0)
MCH: 31.6 pg (ref 26.0–34.0)
MCHC: 34.8 g/dL (ref 30.0–36.0)
MCV: 90.9 fL (ref 78.0–100.0)
Platelets: 260 10*3/uL (ref 150–400)
RBC: 4.84 MIL/uL (ref 4.22–5.81)
RDW: 11.8 % (ref 11.5–15.5)
WBC: 14.8 10*3/uL — ABNORMAL HIGH (ref 4.0–10.5)

## 2017-11-01 LAB — BASIC METABOLIC PANEL
Anion gap: 10 (ref 5–15)
BUN: 11 mg/dL (ref 6–20)
CALCIUM: 8.5 mg/dL — AB (ref 8.9–10.3)
CO2: 23 mmol/L (ref 22–32)
CREATININE: 0.68 mg/dL (ref 0.61–1.24)
Chloride: 105 mmol/L (ref 101–111)
Glucose, Bld: 317 mg/dL — ABNORMAL HIGH (ref 65–99)
Potassium: 3.9 mmol/L (ref 3.5–5.1)
SODIUM: 138 mmol/L (ref 135–145)

## 2017-11-01 LAB — HEMOGLOBIN A1C
Hgb A1c MFr Bld: 12.9 % — ABNORMAL HIGH (ref 4.8–5.6)
MEAN PLASMA GLUCOSE: 323.53 mg/dL

## 2017-11-01 MED ORDER — INSULIN ASPART PROT & ASPART (70-30 MIX) 100 UNIT/ML ~~LOC~~ SUSP
14.0000 [IU] | Freq: Two times a day (BID) | SUBCUTANEOUS | Status: DC
  Administered 2017-11-01: 14 [IU] via SUBCUTANEOUS
  Filled 2017-11-01 (×2): qty 10

## 2017-11-01 MED ORDER — TOPIRAMATE 25 MG PO TABS: 25.0000 mg | ORAL_TABLET | Freq: Every day | ORAL | Status: DC

## 2017-11-01 MED ORDER — TOPIRAMATE 25 MG PO TABS: ORAL_TABLET | ORAL | 0 refills | Status: DC

## 2017-11-01 MED ORDER — DIPHENHYDRAMINE HCL 50 MG/ML IJ SOLN
25.0000 mg | Freq: Once | INTRAMUSCULAR | Status: AC
  Administered 2017-11-01: 25 mg via INTRAVENOUS
  Filled 2017-11-01: qty 1

## 2017-11-01 MED ORDER — BLOOD GLUCOSE METER KIT: PACK | 0 refills | Status: DC

## 2017-11-01 MED ORDER — METOCLOPRAMIDE HCL 5 MG/ML IJ SOLN
10.0000 mg | Freq: Once | INTRAMUSCULAR | Status: AC
Start: 2017-11-01 — End: 2017-11-01
  Administered 2017-11-01: 10 mg via INTRAVENOUS
  Filled 2017-11-01: qty 2

## 2017-11-01 MED ORDER — TOPIRAMATE 25 MG PO TABS: 25.0000 mg | ORAL_TABLET | Freq: Two times a day (BID) | ORAL | Status: DC

## 2017-11-01 MED ORDER — INSULIN ASPART PROT & ASPART (70-30 MIX) 100 UNIT/ML ~~LOC~~ SUSP: 14.0000 [IU] | Freq: Two times a day (BID) | SUBCUTANEOUS | 2 refills | Status: DC

## 2017-11-01 MED ORDER — ATORVASTATIN CALCIUM 20 MG PO TABS: 20.0000 mg | ORAL_TABLET | Freq: Every day | ORAL | 0 refills | Status: DC

## 2017-11-01 MED ORDER — KETOROLAC TROMETHAMINE 30 MG/ML IJ SOLN
30.0000 mg | Freq: Once | INTRAMUSCULAR | Status: AC
  Administered 2017-11-01: 30 mg via INTRAVENOUS
  Filled 2017-11-01: qty 1

## 2017-11-01 MED ORDER — INSULIN STARTER KIT- SYRINGES (ENGLISH)
1.0000 | Freq: Once | Status: AC
  Administered 2017-11-01: 1
  Filled 2017-11-01 (×4): qty 1

## 2017-11-01 MED ORDER — NICOTINE 14 MG/24HR TD PT24: 14.0000 mg | MEDICATED_PATCH | Freq: Every day | TRANSDERMAL | 0 refills | Status: DC

## 2017-11-01 MED ORDER — LIVING WELL WITH DIABETES BOOK
Freq: Once | Status: AC
  Administered 2017-11-01: 16:00:00
  Filled 2017-11-01: qty 1

## 2017-11-01 MED ORDER — ACETAMINOPHEN 325 MG PO TABS: 650.0000 mg | ORAL_TABLET | Freq: Four times a day (QID) | ORAL | Status: DC | PRN

## 2017-11-01 MED ORDER — INSULIN ASPART PROT & ASPART (70-30 MIX) 100 UNIT/ML ~~LOC~~ SUSP: 14.0000 [IU] | Freq: Two times a day (BID) | SUBCUTANEOUS | Status: DC

## 2017-11-01 NOTE — Care Management Note (Signed)
Case Management Note  Patient Details  Name: [name] MRN: [id number] Date of Birth: [birth year]  Subjective/Objective:                    Action/Plan: CM consulted for PCP. CM met with the patient and asked for her Medicaid card. Pt was able to have a friend take a picture of the card and send it. Pt is assigned to [facility] for her PCP. CM called [facility] and they have her scheduled through the [facility] for f/u. Pts appointment is June 26th. CM called [facility] to see if any earlier appts and there are not any. CM updated MD. Appt placed on the AVS.  Pt states she has transportation home when ready for d/c.   Expected Discharge Date:                  Expected Discharge Plan:  Home/Self Care  In-House Referral:  PCP / [facility]  Discharge planning Services  CM Consult, Follow-up appt scheduled  Post Acute Care Choice:  NA Choice offered to:     DME Arranged:    DME Agency:     HH Arranged:    HH Agency:     Status of Service:  Completed, signed off  If discussed at [employer] of Stay Meetings, dates discussed:    Additional Comments:  Pollie Friar, RN 11/01/2017, 12:24 PM

## 2017-11-01 NOTE — Progress Notes (Signed)
Inpatient Diabetes Program Recommendations  AACE/ADA: New Consensus Statement on Inpatient Glycemic Control (2015)  Target Ranges:  Prepandial:   less than 140 mg/dL      Peak postprandial:   less than 180 mg/dL (1-2 hours)      Critically ill patients:  140 - 180 mg/dL   Lab Results  Component Value Date   GLUCAP 303 (H) 11/01/2017   HGBA1C 12.9 (H) 11/01/2017    Review of Glycemic Control Results for [name], [name] A "[name]" (MRN [id number]) as of 11/01/2017 13:38  Ref. Range 10/31/2017 21:05 10/31/2017 22:20 11/01/2017 06:11 11/01/2017 10:58  Glucose-Capillary Latest Ref Range: 65 - 99 mg/dL 421 (H) 461 (H) 304 (H) 303 (H)   Diabetes history: Type 2 DM Outpatient Diabetes medications: Metformin 1000 mg BID Current orders for Inpatient glycemic control: Novolog 0-15 units BID, Novolog 0-5 units QHS,   Inpatient Diabetes Program Recommendations:    Of note patient received Decadron 10 mg X1 on 5/21, thus causing even further increases to post prandials. Noted this AM order was placed for A1C, never resulted. Called to lab and put in process for this AM's CBC. Result obtained for 12.9%.   Spoke with patient at length regarding outpatient diabetes management. Patient had been taking 1000 mg of Metformin BID,and feels itis not working anymore.  Reviewed patient's current A1c of 12.9%. Explained what a A1c is and what it measures. Also reviewed goal A1c with patient, importance of good glucose control @ home, and blood sugar goals. Reviewed patho of DM, need for insulin, signs and symptoms of hyper/hypo-glycemia, DKA, and survival skills. Patient able to verbalize interventions for hypoglycemia appropriately. Reviewed the risks and benefits of Novolog 70/30 and encouraged to take with meals. Information provided on Relion products as a back measure.   Patient will need a meter and test strips for discharge. Blood glucose meter kit (includes lancets and strips) ([id number]). Encouraged to begin  checking BS 3 times a day and to inform provider if BS exceeds 300 mg/dL. Patient plans to follow up with PCP at [facility] for appointment.   Explained and demonstrated proper technique on how to draw up insulin with vial and syringe. Showed patient how to draw up air, inject air into vial, draw up insulin, and inject into practice dome. Patient demonstrated competency with insulin administration by vial and syringe. RN to continue working with patient on insulin injections before discharge and allow patient to administer own insulin injections while inpatient. RN care instruction placed and discussed plan with RN.  Patient has good support at home and feels this is a necessary step towards glycemic control. No further questions at this time.  At this time, would recommend Novolin 70/30 14 units BID in preparation for discharge.  Thanks, Bronson Curb, MSN, RNC-OB Diabetes Coordinator [phone] (8a-5p)

## 2017-11-01 NOTE — Discharge Instructions (Signed)
Atorvastatin tablets What is this medicine? ATORVASTATIN (a TORE va sta tin) is known as a HMG-CoA reductase inhibitor or 'statin'. It lowers the level of cholesterol and triglycerides in the blood. This drug may also reduce the risk of heart attack, stroke, or other health problems in patients with risk factors for heart disease. Diet and lifestyle changes are often used with this drug. This medicine may be used for other purposes; ask your health care provider or pharmacist if you have questions. COMMON BRAND NAME(S): Lipitor What should I tell my health care provider before I take this medicine? They need to know if you have any of these conditions: -frequently drink alcoholic beverages -history of stroke, TIA -kidney disease -liver disease -muscle aches or weakness -other medical condition -an unusual or allergic reaction to atorvastatin, other medicines, foods, dyes, or preservatives -pregnant or trying to get pregnant -breast-feeding How should I use this medicine? Take this medicine by mouth with a glass of water. Follow the directions on the prescription label. You can take this medicine with or without food. Take your doses at regular intervals. Do not take your medicine more often than directed. Talk to your pediatrician regarding the use of this medicine in children. While this drug may be prescribed for children as young as 22 years old for selected conditions, precautions do apply. Overdosage: If you think you have taken too much of this medicine contact a poison control center or emergency room at once. NOTE: This medicine is only for you. Do not share this medicine with others. What if I miss a dose? If you miss a dose, take it as soon as you can. If it is almost time for your next dose, take only that dose. Do not take double or extra doses. What may interact with this medicine? Do not take this medicine with any of the following medications: -red yeast  rice -telaprevir -telithromycin -voriconazole This medicine may also interact with the following medications: -alcohol -antiviral medicines for HIV or AIDS -boceprevir -certain antibiotics like clarithromycin, erythromycin, troleandomycin -certain medicines for cholesterol like fenofibrate or gemfibrozil -cimetidine -clarithromycin -colchicine -cyclosporine -digoxin -male hormones, like estrogens or progestins and birth control pills -grapefruit juice -medicines for fungal infections like fluconazole, itraconazole, ketoconazole -niacin -rifampin -spironolactone This list may not describe all possible interactions. Give your health care provider a list of all the medicines, herbs, non-prescription drugs, or dietary supplements you use. Also tell them if you smoke, drink alcohol, or use illegal drugs. Some items may interact with your medicine. What should I watch for while using this medicine? Visit your doctor or health care professional for regular check-ups. You may need regular tests to make sure your liver is working properly. Tell your doctor or health care professional right away if you get any unexplained muscle pain, tenderness, or weakness, especially if you also have a fever and tiredness. Your doctor or health care professional may tell you to stop taking this medicine if you develop muscle problems. If your muscle problems do not go away after stopping this medicine, contact your health care professional. This drug is only part of a total heart-health program. Your doctor or a dietician can suggest a low-cholesterol and low-fat diet to help. Avoid alcohol and smoking, and keep a proper exercise schedule. Do not use this drug if you are pregnant or breast-feeding. Serious side effects to an unborn child or to an infant are possible. Talk to your doctor or pharmacist for more information. This medicine may  affect blood sugar levels. If you have diabetes, check with your doctor  or health care professional before you change your diet or the dose of your diabetic medicine. If you are going to have surgery tell your health care professional that you are taking this drug. What side effects may I notice from receiving this medicine? Side effects that you should report to your doctor or health care professional as soon as possible: -allergic reactions like skin rash, itching or hives, swelling of the face, lips, or tongue -dark urine -fever -joint pain -muscle cramps, pain -redness, blistering, peeling or loosening of the skin, including inside the mouth -trouble passing urine or change in the amount of urine -unusually weak or tired -yellowing of eyes or skin Side effects that usually do not require medical attention (report to your doctor or health care professional if they continue or are bothersome): -constipation -heartburn -stomach gas, pain, upset This list may not describe all possible side effects. Call your doctor for medical advice about side effects. You may report side effects to FDA at 1-800-FDA-1088. Where should I keep my medicine? Keep out of the reach of children. Store at room temperature between 20 to 25 degrees C (68 to 77 degrees F). Throw away any unused medicine after the expiration date. NOTE: This sheet is a summary. It may not cover all possible information. If you have questions about this medicine, talk to your doctor, pharmacist, or health care provider.  2018 Elsevier/Gold Standard (2011-04-19 09:18:24) Insulin Aspart injection What is this medicine? INSULIN ASPART (IN su lin AS part) is a human-made form of insulin. This drug lowers the amount of sugar in your blood. It is a fast acting insulin that starts working faster than regular insulin. It will not work as long as regular insulin. This medicine may be used for other purposes; ask your health care provider or pharmacist if you have questions. COMMON BRAND NAME(S): Fiasp, [employer], NovoLog, NovoLog Flexpen, NovoLog PenFill What should I tell my health care provider before I take this medicine? They need to know if you have any of these conditions: -episodes of low blood sugar -kidney disease -liver disease -an unusual or allergic reaction to insulin, metacresol, other medicines, foods, dyes, or preservatives -pregnant or trying to get pregnant -breast-feeding How should I use this medicine? This medicine is for injection under the skin. Use exactly as directed. It is important to follow the directions given to you by your health care professional or doctor. If you are using Novolog, you should start your meal within 5 to 10 minutes after injection. If you are using Fiasp, you should start your meal at the time of injection or within 20 minutes after injection. Have food ready before injection. Do not delay eating. You will be taught how to use this medicine and how to adjust doses for activities and illness. Do not use more insulin than prescribed. Do not use more or less often than prescribed. Always check the appearance of your insulin before using it. This medicine should be clear and colorless like water. Do not use if it is cloudy, thickened, colored, or has solid particles in it. It is important that you put your used needles and syringes in a special sharps container. Do not put them in a trash can. If you do not have a sharps container, call your pharmacist or healthcare provider to get one. Talk to your pediatrician regarding the use of this medicine in children. While Novolog may  be prescribed for children as young as 68 years of age for selected conditions, precautions do apply. [place] is not approved for use in children. Overdosage: If you think you have taken too much of this medicine contact a poison control center or emergency room at once. NOTE: This medicine is only for you. Do not share this medicine with others. What if I miss a dose? It is important  not to miss a dose. Your health care professional or doctor should discuss a plan for missed doses with you. If you do miss a dose, follow their plan. Do not take double doses. What may interact with this medicine? -other medicines for diabetes Many medications may cause an increase or decrease in blood sugar, these include: -alcohol containing beverages -antiviral medicines for HIV or AIDS -aspirin and aspirin-like drugs -certain medicines for depression, anxiety, or psychotic disturbances -chromium -diuretics -male hormones, like estrogens or progestins and birth control pills -heart medicines -isoniazid -MAOIs like Carbex, Eldepryl, Marplan, Nardil, and Parnate -male hormones or anabolic steroids -medicines for weight loss -medicines for allergies, asthma, cold, or cough -niacin -NSAIDs, medicines for pain and inflammation, like ibuprofen or naproxen -octreotide -pentamidine -phenytoin -probenecid -quinolone antibiotics like ciprofloxacin, levofloxacin, ofloxacin -some herbal dietary supplements -steroid medicines like prednisone or cortisone -sulfamethoxazole; trimethoprim -thyroid medicine Some medications can hide the warning symptoms of low blood sugar. You may need to monitor your blood sugar more closely if you are taking one of these medications. These include: -beta-blockers such as atenolol, metoprolol, propranolol -clonidine -guanethidine -reserpine This list may not describe all possible interactions. Give your health care provider a list of all the medicines, herbs, non-prescription drugs, or dietary supplements you use. Also tell them if you smoke, drink alcohol, or use illegal drugs. Some items may interact with your medicine. What should I watch for while using this medicine? Visit your health care professional or doctor for regular checks on your progress. A test called the HbA1C (A1C) will be monitored. This is a simple blood test. It measures your blood  sugar control over the last 2 to 3 months. You will receive this test every 3 to 6 months. Learn how to check your blood sugar. Learn the symptoms of low and high blood sugar and how to manage them. Always carry a quick-source of sugar with you in case you have symptoms of low blood sugar. Examples include hard sugar candy or glucose tablets. Make sure others know that you can choke if you eat or drink when you develop serious symptoms of low blood sugar, such as seizures or unconsciousness. They must get medical help at once. Tell your doctor or health care professional if you have high blood sugar. You might need to change the dose of your medicine. If you are sick or exercising more than usual, you might need to change the dose of your medicine. Do not skip meals. Ask your doctor or health care professional if you should avoid alcohol. Many nonprescription cough and cold products contain sugar or alcohol. These can affect blood sugar. Make sure that you have the right kind of syringe for the type of insulin you use. Try not to change the brand and type of insulin or syringe unless your health care professional or doctor tells you to. Switching insulin brand or type can cause dangerously high or low blood sugar. Always keep an extra supply of insulin, syringes, and needles on hand. Use a syringe one time only. Throw away syringe and needle in a  closed container to prevent accidental needle sticks. Insulin pens and cartridges should never be shared. Even if the needle is changed, sharing may result in passing of viruses like hepatitis or HIV. Wear a medical ID bracelet or chain, and carry a card that describes your disease and details of your medicine and dosage times. What side effects may I notice from receiving this medicine? Side effects that you should report to your doctor or health care professional as soon as possible: -allergic reactions like skin rash, itching or hives, swelling of the face,  lips, or tongue -breathing problems -signs and symptoms of high blood sugar such as dizziness, dry mouth, dry skin, fruity breath, nausea, stomach pain, increased hunger or thirst, increased urination -signs and symptoms of low blood sugar such as feeling anxious, confusion, dizziness, increased hunger, unusually weak or tired, sweating, shakiness, cold, irritable, headache, blurred vision, fast heartbeat, loss of consciousness Side effects that usually do not require medical attention (report to your doctor or health care professional if they continue or are bothersome): -increase or decrease in fatty tissue under the skin due to overuse of a particular injection site -itching, burning, swelling, or rash at site where injected This list may not describe all possible side effects. Call your doctor for medical advice about side effects. You may report side effects to FDA at 1-800-FDA-1088. Where should I keep my medicine? Keep out of the reach of children. Store unopened insulin vials in a refrigerator between 2 and 8 degrees C (36 and 46 degrees F). Do not freeze or use if the insulin has been frozen. Opened vials (vials currently in use) may be stored in the refrigerator or at room temperature, at approximately 30 degrees C (86 degrees F) or cooler. Keeping your insulin at room temperature decreases the amount of pain during injection. Once opened, your insulin can be used for 28 days. After 28 days, the vial of insulin should be thrown away. Store unopened cartridges, Novolog FlexPens,or [employer] in a refrigerator between 2 and 8 degrees C (36 and 46 degrees F.) Do not freeze or use if the insulin has been frozen. Once opened, the Novalog FlexPen and cartridges that are inserted into pens should be kept at room temperature, approximately 25 degrees C (77 degrees F) or cooler for up to 28 days; do not store in the refrigerator. The opened [employer] can be stored at room temperature  or refrigerated for up to 28 days. After 28 days, any unused insulin in any of these opened products should be thrown away. Protect from light and excessive heat. Throw away any unused medicine after the expiration date or after the specified time for room temperature storage has passed. NOTE: This sheet is a summary. It may not cover all possible information. If you have questions about this medicine, talk to your doctor, pharmacist, or health care provider.  2018 Elsevier/Gold Standard (2016-03-28 13:55:03) Topiramate tablets What is this medicine? TOPIRAMATE (toe PYRE a mate) is used to treat seizures in adults or children with epilepsy. It is also used for the prevention of migraine headaches. This medicine may be used for other purposes; ask your health care provider or pharmacist if you have questions. COMMON BRAND NAME(S): Topamax, Topiragen What should I tell my health care provider before I take this medicine? They need to know if you have any of these conditions: -bleeding disorders -cirrhosis of the liver or liver disease -diarrhea -glaucoma -kidney stones or kidney disease -low blood counts,  like low white cell, platelet, or red cell counts -lung disease like asthma, obstructive pulmonary disease, emphysema -metabolic acidosis -on a ketogenic diet -schedule for surgery or a procedure -suicidal thoughts, plans, or attempt; a previous suicide attempt by you or a family member -an unusual or allergic reaction to topiramate, other medicines, foods, dyes, or preservatives -pregnant or trying to get pregnant -breast-feeding How should I use this medicine? Take this medicine by mouth with a glass of water. Follow the directions on the prescription label. Do not crush or chew. You may take this medicine with meals. Take your medicine at regular intervals. Do not take it more often than directed. Talk to your pediatrician regarding the use of this medicine in children. Special care may  be needed. While this drug may be prescribed for children as young as 57 years of age for selected conditions, precautions do apply. Overdosage: If you think you have taken too much of this medicine contact a poison control center or emergency room at once. NOTE: This medicine is only for you. Do not share this medicine with others. What if I miss a dose? If you miss a dose, take it as soon as you can. If your next dose is to be taken in less than 6 hours, then do not take the missed dose. Take the next dose at your regular time. Do not take double or extra doses. What may interact with this medicine? Do not take this medicine with any of the following medications: -probenecid This medicine may also interact with the following medications: -acetazolamide -alcohol -amitriptyline -aspirin and aspirin-like medicines -birth control pills -certain medicines for depression -certain medicines for seizures -certain medicines that treat or prevent blood clots like warfarin, enoxaparin, dalteparin, apixaban, dabigatran, and rivaroxaban -digoxin -hydrochlorothiazide -lithium -medicines for pain, sleep, or muscle relaxation -metformin -methazolamide -NSAIDS, medicines for pain and inflammation, like ibuprofen or naproxen -pioglitazone -risperidone This list may not describe all possible interactions. Give your health care provider a list of all the medicines, herbs, non-prescription drugs, or dietary supplements you use. Also tell them if you smoke, drink alcohol, or use illegal drugs. Some items may interact with your medicine. What should I watch for while using this medicine? Visit your doctor or health care professional for regular checks on your progress. Do not stop taking this medicine suddenly. This increases the risk of seizures if you are using this medicine to control epilepsy. Wear a medical identification bracelet or chain to say you have epilepsy or seizures, and carry a card that lists  all your medicines. This medicine can decrease sweating and increase your body temperature. Watch for signs of deceased sweating or fever, especially in children. Avoid extreme heat, hot baths, and saunas. Be careful about exercising, especially in hot weather. Contact your health care provider right away if you notice a fever or decrease in sweating. You should drink plenty of fluids while taking this medicine. If you have had kidney stones in the past, this will help to reduce your chances of forming kidney stones. If you have stomach pain, with nausea or vomiting and yellowing of your eyes or skin, call your doctor immediately. You may get drowsy, dizzy, or have blurred vision. Do not drive, use machinery, or do anything that needs mental alertness until you know how this medicine affects you. To reduce dizziness, do not sit or stand up quickly, especially if you are an older patient. Alcohol can increase drowsiness and dizziness. Avoid alcoholic drinks. If you notice  blurred vision, eye pain, or other eye problems, seek medical attention at once for an eye exam. The use of this medicine may increase the chance of suicidal thoughts or actions. Pay special attention to how you are responding while on this medicine. Any worsening of mood, or thoughts of suicide or dying should be reported to your health care professional right away. This medicine may increase the chance of developing metabolic acidosis. If left untreated, this can cause kidney stones, bone disease, or slowed growth in children. Symptoms include breathing fast, fatigue, loss of appetite, irregular heartbeat, or loss of consciousness. Call your doctor immediately if you experience any of these side effects. Also, tell your doctor about any surgery you plan on having while taking this medicine since this may increase your risk for metabolic acidosis. Birth control pills may not work properly while you are taking this medicine. Talk to your  doctor about using an extra method of birth control. Women who become pregnant while using this medicine may enroll in the [place] [employer] Antiepileptic Drug Pregnancy Registry by calling [phone]. This registry collects information about the safety of antiepileptic drug use during pregnancy. What side effects may I notice from receiving this medicine? Side effects that you should report to your doctor or health care professional as soon as possible: -allergic reactions like skin rash, itching or hives, swelling of the face, lips, or tongue -decreased sweating and/or rise in body temperature -depression -difficulty breathing, fast or irregular breathing patterns -difficulty speaking -difficulty walking or controlling muscle movements -hearing impairment -redness, blistering, peeling or loosening of the skin, including inside the mouth -tingling, pain or numbness in the hands or feet -unusual bleeding or bruising -unusually weak or tired -worsening of mood, thoughts or actions of suicide or dying Side effects that usually do not require medical attention (report to your doctor or health care professional if they continue or are bothersome): -altered taste -back pain, joint or muscle aches and pains -diarrhea, or constipation -headache -loss of appetite -nausea -stomach upset, indigestion -tremors This list may not describe all possible side effects. Call your doctor for medical advice about side effects. You may report side effects to FDA at 1-800-FDA-1088. Where should I keep my medicine? Keep out of the reach of children. Store at room temperature between 15 and 30 degrees C (59 and 86 degrees F) in a tightly closed container. Protect from moisture. Throw away any unused medicine after the expiration date. NOTE: This sheet is a summary. It may not cover all possible information. If you have questions about this medicine, talk to your doctor, pharmacist, or health care provider.   2018 Elsevier/Gold Standard (2013-06-03 23:17:57) STOP SMOKING! STOP VAPING!

## 2017-11-01 NOTE — Evaluation (Signed)
OT Cancellation Note  Patient Details Name: DATON SZILAGYI MRN: [id number] DOB: 15-May-1971   Cancelled Treatment:    Reason Eval/Treat Not Completed: OT screened, no needs identified, will sign off; spoke with PT and with pt, pt reports feeling back to baseline regarding ADL and mobility completion and currently without visual disturbances, PT in agreement of pt being at her baseline; no acute OT needs at this time. OT referral is appreciated, will sign off. Please reconsult if needs change.   Marcy Siren, OT Pager [phone] 11/01/2017   Orlando Penner 11/01/2017, 4:44 PM

## 2017-11-01 NOTE — Discharge Summary (Addendum)
Physician Discharge Summary  [name] LXB:[id number] DOB: 08/12/1970 DOA: 10/30/2017  PCP: Patient, No Pcp Per  Admit date: 10/30/2017 Discharge date: 11/01/2017  Admitted From: home Discharge disposition: home   Recommendations for Outpatient Follow-Up:   1. Patient was established with PCP for diabetic management 2. Referral made to neurology 3. Needs routine opthomology follow up for eye checks-- at risk of macular degeneration 4. Titration of topamax 5. Tobacco cessation   Discharge Diagnosis:   Active Problems:   Human immunodeficiency virus (HIV) disease ([place])   Depression with anxiety   Smoker   Poorly controlled diabetes mellitus ([place])   Essential hypertension   Diabetes mellitus type 2 in obese ([place])   Migraine with visual aura    Discharge Condition: Improved.  Diet recommendation: Low sodium, heart healthy.  Carbohydrate-modified  Wound care: None.  Code status: Full.   History of Present Illness:   [name] is a 47 y.o. male with medical history significant for gender dysphoria, HIV, uncontrolled type 2 diabetes mellitus, and hypertension, now presenting to the emergency department for evaluation of visual disturbance for the past week or so.  Patient reports a pressure sensation behind both eyes, mild headache, and visual disturbances over the past week or more.  Denies fall or trauma, denies fevers or chills, and denies focal numbness or weakness.  Never experienced these symptoms previously.  ED Course: Upon arrival to the ED, patient is found to be afebrile, saturating adequately on room air, and with vitals otherwise stable.  EKG features a sinus rhythm and chemistry panel is notable for a sugar of 380.  CBC features a leukocytosis to 13,300 and hemoglobin 18.2.  Urinalysis is notable for glucosuria and elevated specific gravity.  MRI reveals a small focus of abnormality at the right occiput concerning for small subacute ischemic CVA.   Neurology was consulted by the ED physician and recommended medical admission for further evaluation and management.  The patient was given a liter normal saline, Decadron, Benadryl, insulin, Depakote, Toradol, meclizine, Compazine, Reglan, and DuoNeb in the ED.      Hospital Course by Problem:   Complicated migraine with visual aura Resultant intermittent left visual field visual aura with HA MRI reported right occipital punctate infarcts, however Dr. Erlinda Hong does not see CVA when he reviewed CTA head and neck - mild b/l ICA proximal stenosis, < 40% LDL 129- statin HgbA1c > 12-- needs PCP-- have started 7/30 insulin Continue ASA 85m for stroke prevention given stroke risk factors. Dr. XErlinda Hong Recommends topamax 279mQhs for 7 days and then 2516mid.  Tobacco cessation  Chronic leukocytosis  WBC 13.3 this admission  Pt WBC around 11.6-15.4 range since 2015  Pt denies fever, neck pain, confusion, weakness or numbness  Neck supple no meningismus  No evidence of CNS infection  Also got dose of steroid IV  HIV  Well controlled  Follow with Dr. VanTommy Medaliabetes--Uncontrolled  hyperglycemia  Started 70/30 and will need close follow up  Tobacco abuse  Current smoker/vape-- was vaping in hospital room  Smoking cessation counseling provided   Hyperlipidemia  Statin added      Medical Consultants:   neuro   Discharge Exam:   Vitals:   11/01/17 1220 11/01/17 1723  BP: 131/80 129/83  Pulse: 76 67  Resp: 18 18  Temp: 97.7 F (36.5 C) 98.3 F (36.8 C)  SpO2: 93% 96%   Vitals:   11/01/17 0400 11/01/17 0754 11/01/17 1220  11/01/17 1723  BP: 117/78 128/84 131/80 129/83  Pulse: 67 70 76 67  Resp: _0 Temp: 97.9 F (36.6 C) 97.6 F (36.4 C) 97.7 F (36.5 C) 98.3 F (36.8 C)  TempSrc: Oral Oral Oral Oral  SpO2: 96% 93% 93% 96%    General exam: Appears calm and comfortable.-- says she is still having some visual trouble, not better when 1  eye is covered  The results of significant diagnostics from this hospitalization (including imaging, microbiology, ancillary and laboratory) are listed below for reference.     Procedures and Diagnostic Studies:   Ct Angio Head W Or Wo Contrast  Result Date: 10/31/2017 CLINICAL DATA:  Initial evaluation for acute stroke. EXAM: CT ANGIOGRAPHY HEAD AND NECK TECHNIQUE: Multidetector CT imaging of the head and neck was performed using the standard protocol during bolus administration of intravenous contrast. Multiplanar CT image reconstructions and MIPs were obtained to evaluate the vascular anatomy. Carotid stenosis measurements (when applicable) are obtained utilizing NASCET criteria, using the distal internal carotid diameter as the denominator. CONTRAST:  71m ISOVUE-370 IOPAMIDOL (ISOVUE-370) INJECTION 76% COMPARISON:  Prior brain MRI from earlier same day. FINDINGS: CT HEAD FINDINGS Brain: Previously noted small subacute infarct at the right occipital pole not well seen. No acute intracranial hemorrhage. No other acute large vessel territory infarct. No mass lesion, midline shift or mass effect. No hydrocephalus. No extra-axial fluid collection. Vascular: No asymmetric hyperdense vessel. Skull: Scalp soft tissues and calvarium within normal limits. Sinuses: Left maxillary sinus retention cysts.  Otherwise clear. Orbits: Globes and orbital soft tissues within normal limits. Review of the MIP images confirms the above findings CTA NECK FINDINGS Aortic arch: Visualized aortic arch of normal caliber with normal branch pattern. No hemodynamically significant stenosis at the origin of the great vessels. Focal mixed plaque noted at the origin of the left subclavian artery. Visualized subclavian arteries widely patent. Right carotid system: Atheromatous irregularity throughout the right common carotid artery without significant stenosis. Mixed plaque about the right bifurcation/proximal right ICA with associated  stenosis of approximately 40-50% by NASCET criteria. Right ICA otherwise patent to the skull base without stenosis, dissection, or occlusion. Left carotid system: A centric noncalcified plaque within the left common carotid artery without hemodynamically significant stenosis. Mixed plaque about the left bifurcation/proximal left ICA with relatively mild narrowing of approximately 35-40% by NASCET criteria. Left ICA patent distally to the skull base without stenosis, dissection, or occlusion. Vertebral arteries: Both of the vertebral arteries arise from the subclavian arteries. Mild diffuse atheromatous irregularity within the vertebral arteries bilaterally without hemodynamically significant stenosis. No evidence for dissection or vascular occlusion. Skeleton: No acute osseous abnormality. No worrisome lytic or blastic osseous lesions. Moderate spondylolysis noted at C5-6. Other neck: No acute soft tissue abnormality within the neck. Postsurgical changes noted within the left submandibular space related to prior left submandibular gland resection. No adenopathy. Thyroid normal. Upper chest: Visualized upper chest unremarkable. Partially visualized lungs are clear. Review of the MIP images confirms the above findings CTA HEAD FINDINGS Anterior circulation: Petrous, cavernous, and supraclinoid segments patent bilaterally without hemodynamically significant stenosis. Mild plaque within the cavernous left ICA. A1 segments, anterior communicating artery common anterior cerebral arteries patent without stenosis. No M1 stenosis or occlusion. Normal MCA bifurcations. No proximal M2 occlusion. Distal MCA branches well perfused and symmetric. Posterior circulation: Vertebral arteries patent to the vertebrobasilar junction without hemodynamically significant stenosis. Mild symmetric irregularity within the vertebral arteries bilaterally favored to be artifactual. Left vertebral artery  dominant. Posterior inferior cerebral  arteries patent bilaterally. Basilar patent to its distal aspect without stenosis. Superior cerebral arteries patent bilaterally. Both of the posterior cerebral artery supplied via the basilar and are widely patent to their distal aspects. Venous sinuses: Grossly patent, although not well evaluated due to arterial timing the contrast bolus. Anatomic variants: None significant. No aneurysm or other vascular abnormality. Delayed phase: No appreciable abnormal enhancement. Review of the MIP images confirms the above findings IMPRESSION: 1. Negative CTA with no large vessel occlusion identified. 2. 50% atheromatous stenosis at the proximal right ICA. 3. Mild atheromatous narrowing at the proximal left ICA with stenosis of up to 35-40% by NASCET criteria. 4. Widely patent vertebral arteries within the neck. 5. No hemodynamically significant or correctable stenosis within the intracranial circulation. Electronically Signed   By: Jeannine Boga M.D.   On: 10/31/2017 06:58   Ct Angio Neck W And/or Wo Contrast  Result Date: 10/31/2017 CLINICAL DATA:  Initial evaluation for acute stroke. EXAM: CT ANGIOGRAPHY HEAD AND NECK TECHNIQUE: Multidetector CT imaging of the head and neck was performed using the standard protocol during bolus administration of intravenous contrast. Multiplanar CT image reconstructions and MIPs were obtained to evaluate the vascular anatomy. Carotid stenosis measurements (when applicable) are obtained utilizing NASCET criteria, using the distal internal carotid diameter as the denominator. CONTRAST:  19m ISOVUE-370 IOPAMIDOL (ISOVUE-370) INJECTION 76% COMPARISON:  Prior brain MRI from earlier same day. FINDINGS: CT HEAD FINDINGS Brain: Previously noted small subacute infarct at the right occipital pole not well seen. No acute intracranial hemorrhage. No other acute large vessel territory infarct. No mass lesion, midline shift or mass effect. No hydrocephalus. No extra-axial fluid collection.  Vascular: No asymmetric hyperdense vessel. Skull: Scalp soft tissues and calvarium within normal limits. Sinuses: Left maxillary sinus retention cysts.  Otherwise clear. Orbits: Globes and orbital soft tissues within normal limits. Review of the MIP images confirms the above findings CTA NECK FINDINGS Aortic arch: Visualized aortic arch of normal caliber with normal branch pattern. No hemodynamically significant stenosis at the origin of the great vessels. Focal mixed plaque noted at the origin of the left subclavian artery. Visualized subclavian arteries widely patent. Right carotid system: Atheromatous irregularity throughout the right common carotid artery without significant stenosis. Mixed plaque about the right bifurcation/proximal right ICA with associated stenosis of approximately 40-50% by NASCET criteria. Right ICA otherwise patent to the skull base without stenosis, dissection, or occlusion. Left carotid system: A centric noncalcified plaque within the left common carotid artery without hemodynamically significant stenosis. Mixed plaque about the left bifurcation/proximal left ICA with relatively mild narrowing of approximately 35-40% by NASCET criteria. Left ICA patent distally to the skull base without stenosis, dissection, or occlusion. Vertebral arteries: Both of the vertebral arteries arise from the subclavian arteries. Mild diffuse atheromatous irregularity within the vertebral arteries bilaterally without hemodynamically significant stenosis. No evidence for dissection or vascular occlusion. Skeleton: No acute osseous abnormality. No worrisome lytic or blastic osseous lesions. Moderate spondylolysis noted at C5-6. Other neck: No acute soft tissue abnormality within the neck. Postsurgical changes noted within the left submandibular space related to prior left submandibular gland resection. No adenopathy. Thyroid normal. Upper chest: Visualized upper chest unremarkable. Partially visualized lungs are  clear. Review of the MIP images confirms the above findings CTA HEAD FINDINGS Anterior circulation: Petrous, cavernous, and supraclinoid segments patent bilaterally without hemodynamically significant stenosis. Mild plaque within the cavernous left ICA. A1 segments, anterior communicating artery common anterior cerebral arteries patent without stenosis.  No M1 stenosis or occlusion. Normal MCA bifurcations. No proximal M2 occlusion. Distal MCA branches well perfused and symmetric. Posterior circulation: Vertebral arteries patent to the vertebrobasilar junction without hemodynamically significant stenosis. Mild symmetric irregularity within the vertebral arteries bilaterally favored to be artifactual. Left vertebral artery dominant. Posterior inferior cerebral arteries patent bilaterally. Basilar patent to its distal aspect without stenosis. Superior cerebral arteries patent bilaterally. Both of the posterior cerebral artery supplied via the basilar and are widely patent to their distal aspects. Venous sinuses: Grossly patent, although not well evaluated due to arterial timing the contrast bolus. Anatomic variants: None significant. No aneurysm or other vascular abnormality. Delayed phase: No appreciable abnormal enhancement. Review of the MIP images confirms the above findings IMPRESSION: 1. Negative CTA with no large vessel occlusion identified. 2. 50% atheromatous stenosis at the proximal right ICA. 3. Mild atheromatous narrowing at the proximal left ICA with stenosis of up to 35-40% by NASCET criteria. 4. Widely patent vertebral arteries within the neck. 5. No hemodynamically significant or correctable stenosis within the intracranial circulation. Electronically Signed   By: Jeannine Boga M.D.   On: 10/31/2017 06:58   Mr Brain W And Wo Contrast  Result Date: 10/31/2017 CLINICAL DATA:  Initial evaluation for left-sided visual field changes for past 2 weeks. EXAM: MRI HEAD WITHOUT AND WITH CONTRAST  TECHNIQUE: Multiplanar, multiecho pulse sequences of the brain and surrounding structures were obtained without and with intravenous contrast. CONTRAST:  67m MULTIHANCE GADOBENATE DIMEGLUMINE 529 MG/ML IV SOLN COMPARISON:  None available. FINDINGS: Brain: Cerebral volume within normal limits. No significant cerebral white matter changes for age. There is a subtle small focus of T2/FLAIR signal abnormality at the right occipital pole (series 11001, image 11). Associated faint patchy diffusion signal abnormality within this region (series 5001, image 56). Small amount of susceptibility artifact (series 12001, image 23). Associated postcontrast leptomeningeal enhancement (series 16001, image 22). Finding felt to be most consistent with a small subacute cortical infarct, right PCA territory. Infarct measures no more than 13 mm in size. No other evidence for acute or subacute ischemia. Gray-white matter differentiation otherwise maintained. No other areas of chronic infarction. No other acute or chronic intracranial hemorrhage. No mass lesion, midline shift or mass effect. No hydrocephalus. No extra-axial fluid collection. Major dural sinuses grossly patent. No other abnormal enhancement. Pituitary gland and suprasellar region within normal limits. Midline structures intact and normal. Vascular: Major intracranial vascular flow voids are maintained. Skull and upper cervical spine: Craniocervical junction normal. Upper cervical spine within normal limits. Signal intensity within the visualized bone marrow diffusely decreased on T1 weighted imaging, most commonly related to anemia, smoking, obesity, or other chronic disease. No scalp soft tissue abnormality. Sinuses/Orbits: Globes and orbital soft tissues within normal limits. Bilateral maxillary sinus retention cyst noted. Paranasal sinuses are otherwise clear. Bilateral mastoid effusions noted, most likely benign/sterile. Inner ear structures grossly normal. Other:  None. IMPRESSION: 1. Small focus of signal abnormality at the right occipital pole as above, most consistent with a small subacute ischemic infarct. 2. Otherwise normal brain MRI. Electronically Signed   By: BJeannine BogaM.D.   On: 10/31/2017 03:02     Labs:   Basic Metabolic Panel: Recent Labs  Lab 10/30/17 1427 11/01/17 0334  NA 134* 138  K 4.1 3.9  CL 100* 105  CO2 24 23  GLUCOSE 380* 317*  BUN 11 11  CREATININE 0.77 0.68  CALCIUM 9.3 8.5*   GFR CrCl cannot be calculated (Unknown ideal weight.). Liver Function  Tests: Recent Labs  Lab 10/30/17 1427  AST 21  ALT 29  ALKPHOS 99  BILITOT 0.8  PROT 7.5  ALBUMIN 3.9   No results for input(s): LIPASE, AMYLASE in the last 168 hours. No results for input(s): AMMONIA in the last 168 hours. Coagulation profile No results for input(s): INR, PROTIME in the last 168 hours.  CBC: Recent Labs  Lab 10/30/17 1427 11/01/17 0334  WBC 13.3* 14.8*  NEUTROABS 7.6  --   HGB 18.2* 15.3  HCT 51.7 44.0  MCV 89.9 90.9  PLT 302 260   Cardiac Enzymes: No results for input(s): CKTOTAL, CKMB, CKMBINDEX, TROPONINI in the last 168 hours. BNP: Invalid input(s): POCBNP CBG: Recent Labs  Lab 10/31/17 2220 11/01/17 0611 11/01/17 1058 11/01/17 1628 11/01/17 1724  GLUCAP 461* 304* 303* 386* 379*   D-Dimer No results for input(s): DDIMER in the last 72 hours. Hgb A1c Recent Labs    11/01/17 0334  HGBA1C 12.9*   Lipid Profile Recent Labs    11/01/17 0334  CHOL 207*  HDL 31*  LDLCALC 129*  TRIG 236*  CHOLHDL 6.7   Thyroid function studies No results for input(s): TSH, T4TOTAL, T3FREE, THYROIDAB in the last 72 hours.  Invalid input(s): FREET3 Anemia work up No results for input(s): VITAMINB12, FOLATE, FERRITIN, TIBC, IRON, RETICCTPCT in the last 72 hours. Microbiology No results found for this or any previous visit (from the past 240 hour(s)).   Discharge Instructions:   Discharge Instructions     Ambulatory referral to Neurology   Complete by:  As directed    Follow up with stroke clinic NP (Kimmarie Pascale Vanschaick or Cecille Rubin, if both not available, consider Zachery Dauer, or [place]) at [facility] in about 4 weeks. Thanks.   Diet Carb Modified   Complete by:  As directed    Discharge instructions   Complete by:  As directed    Bring log of blood sugars to PCP   Increase activity slowly   Complete by:  As directed      Allergies as of 11/01/2017      Reactions   Propoxyphene N-acetaminophen Other (See Comments)   Stomach cramps      Medication List    TAKE these medications   albuterol 108 (90 Base) MCG/ACT inhaler Commonly known as:  PROVENTIL HFA;VENTOLIN HFA Inhale 2 puffs into the lungs every 6 (six) hours as needed for wheezing or shortness of breath.   atorvastatin 20 MG tablet Commonly known as:  LIPITOR Take 1 tablet (20 mg total) by mouth daily at 6 PM.   bictegravir-emtricitabine-tenofovir AF 50-200-25 MG Tabs tablet Commonly known as:  BIKTARVY Take 1 tablet by mouth daily.   blood glucose meter kit and supplies Dispense based on patient and insurance preference. Use up to four times daily as directed. (FOR ICD-10 E10.9, E11.9).   escitalopram 20 MG tablet Commonly known as:  LEXAPRO Take 1 tablet (20 mg total) by mouth at bedtime.   insulin aspart protamine- aspart (70-30) 100 UNIT/ML injection Commonly known as:  NOVOLOG MIX 70/30 Inject 0.14 mLs (14 Units total) into the skin 2 (two) times daily with a meal. Start taking on:  11/02/2017   metFORMIN 500 MG tablet Commonly known as:  GLUCOPHAGE Take 2 tablets (1,000 mg total) by mouth 2 (two) times daily with a meal. Take ONE 500 BID x 2 weeks then 2 tabs BID   nicotine 14 mg/24hr patch Commonly known as:  NICODERM CQ - dosed in mg/24 hours Place  1 patch (14 mg total) onto the skin daily. Start taking on:  11/02/2017   omeprazole 20 MG capsule Commonly known as:  PRILOSEC Take 2 capsules (40 mg  total) by mouth at bedtime.   spironolactone 50 MG tablet Commonly known as:  ALDACTONE Take 1 tablet (50 mg total) by mouth daily.   topiramate 25 MG tablet Commonly known as:  TOPAMAX 25 mg QHS x 6 days then 25 mg BID      Follow-up Information    [facility]. Schedule an appointment as soon as possible for a visit in 4 week(s).   Specialty:  Radiology Contact information: [address] [place] Follow up on 12/06/2017.   Why:  _0 :00 Contact information: [place] [address] [phone]       please schedule a follow up eye exam with an opthamologist for routine diabetic eye care Follow up.            Time coordinating discharge:35 min  Signed:  [name]  [facility] 11/01/2017, 6:34 PM

## 2017-11-01 NOTE — Plan of Care (Signed)

## 2017-11-01 NOTE — Evaluation (Signed)
SLP Cancellation Note  Patient Details Name: RAUNAK ANTUNA MRN: [id number] DOB: March 05, 1971   Cancelled treatment:       Reason Eval/Treat Not Completed: Other (comment)(spoke to MD who reports speech not indicated)   Chales Abrahams 11/01/2017, 9:19 AM  Donavan Burnet, MS [facility] SLP [phone]

## 2017-11-01 NOTE — Evaluation (Signed)
Physical Therapy Evaluation Patient Details Name: [name] MRN: [id number] DOB: [birth year] Today's Date: 11/01/2017   History of Present Illness  Pt is a 47 y.o. male (identifies as male) with PMH of gender dysphoria, HIV, DM, HTN,  admitted 10/30/17 with c/o visual disturbances and headache.  Initial MRI impression reported R occipital punctate infarcts; per neurology note, does not appreciate any abnormalities on MRI. Worked up for complicated migraine with visual aura.    Clinical Impression  Patient evaluated by Physical Therapy with no further acute PT needs identified. PTA, pt indep and lives with family. Today, pt only c/o visual disturbances lasting ~5 minutes while ambulating; demonstrates good ability to compensate for this with head turns and scanning environment. While walking, visual disturbances resolving; pt reports no trend in onset or how long episodes last. DGI 24/24 indicates decreased risk for falls. All education has been completed and the patient has no further questions. PT is signing off. Thank you for this referral.    Follow Up Recommendations No PT follow up    Equipment Recommendations  None recommended by PT    Recommendations for Other Services       Precautions / Restrictions Precautions Precautions: None Restrictions Weight Bearing Restrictions: No      Mobility  Bed Mobility Overal bed mobility: Independent                Transfers Overall transfer level: Independent                  Ambulation/Gait Ambulation/Gait assistance: Independent Ambulation Distance (Feet): 600 Feet Assistive device: None Gait Pattern/deviations: WFL(Within Functional Limits)   Gait velocity interpretation: >4.37 ft/sec, indicative of normal walking speed General Gait Details: Pt initially with no c/o of visual disturbance and amb WFL. While walking, pt stating visual disturbances occurring; able to compensate well by looking at ground and  adjusting view accordingly, even with higher level balance activities  Stairs            Wheelchair Mobility    Modified Rankin (Stroke Patients Only)       Balance Overall balance assessment: Independent                               Standardized Balance Assessment Standardized Balance Assessment : Dynamic Gait Index   Dynamic Gait Index Level Surface: Normal Change in Gait Speed: Normal Gait with Horizontal Head Turns: Normal Gait with Vertical Head Turns: Normal Gait and Pivot Turn: Normal Step Over Obstacle: Normal Step Around Obstacles: Normal Steps: Normal Total Score: 24       Pertinent Vitals/Pain Pain Assessment: No/denies pain    Home Living Family/patient expects to be discharged to:: Private residence Living Arrangements: Other relatives(Sister) Available Help at Discharge: Family;Available PRN/intermittently Type of Home: House Home Access: Stairs to enter Entrance Stairs-Rails: Doctor, general practice of Steps: 2 Home Layout: One level Home Equipment: None      Prior Function Level of Independence: Independent               Hand Dominance        Extremity/Trunk Assessment   Upper Extremity Assessment Upper Extremity Assessment: Overall WFL for tasks assessed    Lower Extremity Assessment Lower Extremity Assessment: Overall WFL for tasks assessed    Cervical / Trunk Assessment Cervical / Trunk Assessment: Normal  Communication   Communication: No difficulties  Cognition Arousal/Alertness: Awake/alert Behavior During Therapy: WFL for tasks  assessed/performed Overall Cognitive Status: Within Functional Limits for tasks assessed                                        General Comments      Exercises     Assessment/Plan    PT Assessment Patent does not need any further PT services  PT Problem List         PT Treatment Interventions      PT Goals (Current goals can be found  in the Care Plan section)  Acute Rehab PT Goals PT Goal Formulation: All assessment and education complete, DC therapy    Frequency     Barriers to discharge        Co-evaluation               AM-PAC PT "6 Clicks" Daily Activity  Outcome Measure Difficulty turning over in bed (including adjusting bedclothes, sheets and blankets)?: None Difficulty moving from lying on back to sitting on the side of the bed? : None Difficulty sitting down on and standing up from a chair with arms (e.g., wheelchair, bedside commode, etc,.)?: None Help needed moving to and from a bed to chair (including a wheelchair)?: None Help needed walking in hospital room?: None Help needed climbing 3-5 steps with a railing? : None 6 Click Score: 24    End of Session   Activity Tolerance: Patient tolerated treatment well Patient left: in bed;with call bell/phone within reach Nurse Communication: Mobility status PT Visit Diagnosis: Other abnormalities of gait and mobility (R26.89)    Time: 1610-9604 PT Time Calculation (min) (ACUTE ONLY): 11 min   Charges:   PT Evaluation $PT Eval Low Complexity: 1 Low     PT G Codes:       Ina Homes, PT, DPT Acute Rehab Services  Pager: [phone]  Malachy Chamber 11/01/2017, 11:32 AM

## 2017-11-02 MED FILL — NOVOLOG MIX 70/30 VIAL: (70-30) 100 | 28 days supply | Qty: 10 | Fill #0

## 2017-11-02 MED FILL — TRUEPLUS SYR 0.5ML 30GX5/16: 30G X 5/16" | 30 days supply | Qty: 60 | Fill #0

## 2017-11-02 MED FILL — ATORVASTATIN 20 MG TABLET: 20 | 30 days supply | Qty: 30 | Fill #0

## 2017-11-02 MED FILL — ACCU-CHEK AVIVA PLUS TEST S: 12 days supply | Qty: 50 | Fill #0

## 2017-11-02 MED FILL — TOPIRAMATE 25 MG TABLET: 25 | 30 days supply | Qty: 54 | Fill #0

## 2017-11-02 MED FILL — ACCU-CHEK SOFTCLIX LANCETS: 25 days supply | Qty: 100 | Fill #0

## 2017-11-02 MED FILL — NICOTINE 14 MG/24HR PATCH: 14 | 28 days supply | Qty: 28 | Fill #0

## 2017-11-02 MED FILL — ACCU-CHEK AVIVA PLUS W/DEVI: W/DEVICE | 20 days supply | Qty: 1 | Fill #0

## 2017-12-06 ENCOUNTER — Encounter (INDEPENDENT_AMBULATORY_CARE_PROVIDER_SITE_OTHER): Payer: Self-pay | Admitting: Physician Assistant

## 2017-12-06 ENCOUNTER — Other Ambulatory Visit: Payer: Self-pay

## 2017-12-06 ENCOUNTER — Ambulatory Visit (INDEPENDENT_AMBULATORY_CARE_PROVIDER_SITE_OTHER): Payer: Medicaid Other | Admitting: Physician Assistant

## 2017-12-06 VITALS — BP 123/85 | HR 76 | Temp 97.9°F | Ht 71.0 in | Wt 240.0 lb

## 2017-12-06 DIAGNOSIS — F17200 Nicotine dependence, unspecified, uncomplicated: Secondary | ICD-10-CM | POA: Diagnosis not present

## 2017-12-06 DIAGNOSIS — Z794 Long term (current) use of insulin: Secondary | ICD-10-CM | POA: Diagnosis not present

## 2017-12-06 DIAGNOSIS — E119 Type 2 diabetes mellitus without complications: Secondary | ICD-10-CM | POA: Diagnosis not present

## 2017-12-06 DIAGNOSIS — G4700 Insomnia, unspecified: Secondary | ICD-10-CM

## 2017-12-06 DIAGNOSIS — R5383 Other fatigue: Secondary | ICD-10-CM

## 2017-12-06 DIAGNOSIS — Z760 Encounter for issue of repeat prescription: Secondary | ICD-10-CM | POA: Diagnosis not present

## 2017-12-06 DIAGNOSIS — F418 Other specified anxiety disorders: Secondary | ICD-10-CM | POA: Diagnosis not present

## 2017-12-06 DIAGNOSIS — G8929 Other chronic pain: Secondary | ICD-10-CM | POA: Diagnosis not present

## 2017-12-06 DIAGNOSIS — M5441 Lumbago with sciatica, right side: Secondary | ICD-10-CM

## 2017-12-06 MED ORDER — ALBUTEROL SULFATE HFA 108 (90 BASE) MCG/ACT IN AERS: 2.0000 | INHALATION_SPRAY | Freq: Four times a day (QID) | RESPIRATORY_TRACT | 6 refills | Status: DC | PRN

## 2017-12-06 MED ORDER — HYDROXYZINE HCL 10 MG PO TABS: 10.0000 mg | ORAL_TABLET | Freq: Two times a day (BID) | ORAL | 1 refills | Status: DC

## 2017-12-06 MED ORDER — GLIMEPIRIDE 4 MG PO TABS: 4.0000 mg | ORAL_TABLET | Freq: Every day | ORAL | 5 refills | Status: DC

## 2017-12-06 MED ORDER — HYDROXYZINE HCL 10 MG PO TABS: 10.0000 mg | ORAL_TABLET | ORAL | 0 refills | Status: DC

## 2017-12-06 MED ORDER — ACCU-CHEK AVIVA PLUS VI STRP: 1.0000 | ORAL_STRIP | Freq: Three times a day (TID) | 5 refills | Status: DC

## 2017-12-06 MED ORDER — VARENICLINE TARTRATE 0.5 MG X 11 & 1 MG X 42 PO MISC: ORAL | 0 refills | Status: DC

## 2017-12-06 MED ORDER — ACCU-CHEK SOFTCLIX LANCETS MISC
1.0000 | Freq: Three times a day (TID) | 5 refills | Status: AC
Start: 2017-12-06 — End: ?

## 2017-12-06 MED FILL — PROAIR HFA 90 MCG INHALER: 108 (90 BAS | 25 days supply | Qty: 9 | Fill #0

## 2017-12-06 MED FILL — metFORMIN HCL 500 MG TABS: 500 | 30 days supply | Qty: 120 | Fill #6

## 2017-12-06 MED FILL — ESCITALOPRAM 20 MG TABLET: 20 | 30 days supply | Qty: 30 | Fill #3

## 2017-12-06 MED FILL — ACCU-CHEK AVIVA PLUS TEST S: 33 days supply | Qty: 100 | Fill #0

## 2017-12-06 MED FILL — ACCU-CHEK FASTCLIX LANCETS: 34 days supply | Qty: 102 | Fill #0

## 2017-12-06 MED FILL — BIKTARVY 50-200-25 MG TABS: 50-200-25 | 30 days supply | Qty: 30 | Fill #6

## 2017-12-06 MED FILL — hydrOXYzine HCL 10 MG TABS: 10 | 30 days supply | Qty: 60 | Fill #0

## 2017-12-06 MED FILL — SPIRONOLACTONE 50 MG TABS: 50 | 30 days supply | Qty: 30 | Fill #3

## 2017-12-06 MED FILL — GLIMEPIRIDE 4 MG TABLET: 4 | 30 days supply | Qty: 30 | Fill #0

## 2017-12-06 NOTE — Progress Notes (Signed)
Subjective:  Patient ID: [name], male    DOB: [birth year]  Age: 47 y.o. MRN: [id number]  CC: DM  HPI [name] is a 47 y.o. male with a medical history of DM2, HIV, HTN, Tobacco use disorder, Testicular mass, perirectal abscess, DDD, LBP pain w/sciatica, depression, anxiety, and Transgenderism presents for management of DM2. Last A1c 12.9% one month ago. Reports blood sugar reading of 140 the only time he checked his blood sugar. Endorses fatigue and tingling/numbness attributed to sciatica. Does not endorse polydipsia, polyuria, polyphagia, or visual blurring.     Smoking since 47 years old. Most he has ever smoked is a pack a day and now smokes half pack a day. Does not want to try Chantix because his friends have had bad reactions to Chantix and he is afraid he may become suicidal. Has a history of depression for which he takes escitalopram for. Has been taking escitalopram for many years and feels the drug has lost its effectiveness. Has issues with initiating sleep. PHQ9 8 and GAD7 9 in clinic today. Does not currently have a psychiatrist but used to go to [place] many years ago.     Outpatient Medications Prior to Visit  Medication Sig Dispense Refill  . ACCU-CHEK AVIVA PLUS test strip   0  . ACCU-CHEK SOFTCLIX LANCETS lancets   0  . atorvastatin (LIPITOR) 20 MG tablet Take 1 tablet (20 mg total) by mouth daily at 6 PM. 30 tablet 0  . bictegravir-emtricitabine-tenofovir AF (BIKTARVY) 50-200-25 MG TABS tablet Take 1 tablet by mouth daily. 30 tablet 11  . blood glucose meter kit and supplies Dispense based on patient and insurance preference. Use up to four times daily as directed. (FOR ICD-10 E10.9, E11.9). 1 each 0  . escitalopram (LEXAPRO) 20 MG tablet Take 1 tablet (20 mg total) by mouth at bedtime. 30 tablet 5  . insulin aspart protamine- aspart (NOVOLOG MIX 70/30) (70-30) 100 UNIT/ML injection Inject 0.14 mLs (14 Units total) into the skin 2 (two) times daily with a meal.  10 mL 2  . metFORMIN (GLUCOPHAGE) 500 MG tablet Take 2 tablets (1,000 mg total) by mouth 2 (two) times daily with a meal. Take ONE 500 BID x 2 weeks then 2 tabs BID 120 tablet 11  . nicotine (NICODERM CQ - DOSED IN MG/24 HOURS) 14 mg/24hr patch Place 1 patch (14 mg total) onto the skin daily. 28 patch 0  . omeprazole (PRILOSEC) 20 MG capsule Take 2 capsules (40 mg total) by mouth at bedtime. 60 capsule 5  . spironolactone (ALDACTONE) 50 MG tablet Take 1 tablet (50 mg total) by mouth daily. 30 tablet 5  . topiramate (TOPAMAX) 25 MG tablet 25 mg QHS x 6 days then 25 mg BID 66 tablet 0  . albuterol (PROVENTIL HFA;VENTOLIN HFA) 108 (90 BASE) MCG/ACT inhaler Inhale 2 puffs into the lungs every 6 (six) hours as needed for wheezing or shortness of breath. (Patient not taking: Reported on 12/06/2017) 1 Inhaler 6   No facility-administered medications prior to visit.      ROS Review of Systems  Constitutional: Negative for chills, fever and malaise/fatigue.  Eyes: Negative for blurred vision.  Respiratory: Negative for shortness of breath.   Cardiovascular: Negative for chest pain and palpitations.  Gastrointestinal: Negative for abdominal pain and nausea.  Genitourinary: Negative for dysuria and hematuria.  Musculoskeletal: Positive for back pain. Negative for joint pain and myalgias.  Skin: Negative for rash.  Neurological: Negative for tingling and  headaches.  Psychiatric/Behavioral: Positive for depression. The patient is nervous/anxious and has insomnia.     Objective:  BP 123/85 (BP Location: Left Arm, Patient Position: Sitting, Cuff Size: Normal)   Pulse 76   Temp 97.9 F (36.6 C) (Oral)   Ht _0  (1.803 m)   Wt 240 lb (108.9 kg)   SpO2 96%   BMI 33.47 kg/m   BP/Weight 12/06/2017 11/01/2017 5/78/4696  Systolic BP 295 284 132  Diastolic BP 85 83 87  Wt. (Lbs) 240 - 248.25  BMI 33.47 - 34.62      Physical Exam  Constitutional: He is oriented to person, place, and time.   Well developed, well nourished, NAD, polite  HENT:  Head: Normocephalic and atraumatic.  Eyes: No scleral icterus.  Neck: Normal range of motion. Neck supple. No thyromegaly present.  Cardiovascular: Normal rate, regular rhythm and normal heart sounds.  Pulmonary/Chest: Effort normal and breath sounds normal.  Abdominal: Soft. Bowel sounds are normal. There is no tenderness.  Musculoskeletal: He exhibits no edema.  Neurological: He is alert and oriented to person, place, and time.  Skin: Skin is warm and dry. No rash noted. No erythema. No pallor.  Psychiatric: He has a normal mood and affect. His behavior is normal. Thought content normal.  Vitals reviewed.    Assessment & Plan:    1. Type 2 diabetes mellitus without complication, with long-term current use of insulin (HCC) - Refill ACCU-CHEK AVIVA PLUS test strip; 1 each by Other route 3 (three) times daily.  Dispense: 100 each; Refill: 5 - refill ACCU-CHEK SOFTCLIX LANCETS lancets; 1 each by Other route 3 (three) times daily.  Dispense: 100 each; Refill: 5 - Continue Metformin 1000 mg BID - Begin glimepiride (AMARYL) 4 MG tablet; Take 1 tablet (4 mg total) by mouth daily before breakfast.  Dispense: 30 tablet; Refill: 5 - Stop Humalog 14 units BID  2. Insomnia, unspecified type - Begin hydrOXYzine (ATARAX/VISTARIL) 10 MG tablet; Take 1 tablet (10 mg total) by mouth BID x30 days #60 one refill  3. Depression with anxiety - Begin hydrOXYzine (ATARAX/VISTARIL) 10 MG tablet; Take 1 tablet (10 mg total) by mouth 60 (sixty) minutes before procedure for 1 dose.  Dispense: 30 tablet; Refill: 0 - Continue Escitalopram - Ambulatory referral to Psychiatry  4. Chronic right-sided low back pain with right-sided sciatica - AMB referral to orthopedics  5. Medication refill - albuterol (PROVENTIL HFA;VENTOLIN HFA) 108 (90 Base) MCG/ACT inhaler; Inhale 2 puffs into the lungs every 6 (six) hours as needed for wheezing or shortness of breath.   Dispense: 1 Inhaler; Refill: 6  6. Tobacco use disorder - varenicline (CHANTIX STARTING MONTH PAK) 0.5 MG X 11 & 1 MG X 42 tablet; Take one 0.5 mg tablet by mouth once daily for 3 days, then increase to one 0.5 mg tablet twice daily for 4 days, then increase to one 1 mg tablet twice daily.  Dispense: 53 tablet; Refill: 0  7. Fatigue, unspecified type - TSH   Meds ordered this encounter  Medications  . hydrOXYzine (ATARAX/VISTARIL) 10 MG tablet    Sig: Take 1 tablet (10 mg total) by mouth 60 (sixty) minutes before procedure for 1 dose.    Dispense:  30 tablet    Refill:  0    Order Specific Question:   Supervising Provider    Answer:   Charlott Rakes [[id number]]  . varenicline (CHANTIX STARTING MONTH PAK) 0.5 MG X 11 & 1 MG X 42 tablet  Sig: Take one 0.5 mg tablet by mouth once daily for 3 days, then increase to one 0.5 mg tablet twice daily for 4 days, then increase to one 1 mg tablet twice daily.    Dispense:  53 tablet    Refill:  0    Order Specific Question:   Supervising Provider    Answer:   Charlott Rakes [[id number]]  . ACCU-CHEK AVIVA PLUS test strip    Sig: 1 each by Other route 3 (three) times daily.    Dispense:  100 each    Refill:  5    Order Specific Question:   Supervising Provider    Answer:   Charlott Rakes [[id number]]  . ACCU-CHEK SOFTCLIX LANCETS lancets    Sig: 1 each by Other route 3 (three) times daily.    Dispense:  100 each    Refill:  5    Order Specific Question:   Supervising Provider    Answer:   Charlott Rakes [[id number]]  . albuterol (PROVENTIL HFA;VENTOLIN HFA) 108 (90 Base) MCG/ACT inhaler    Sig: Inhale 2 puffs into the lungs every 6 (six) hours as needed for wheezing or shortness of breath.    Dispense:  1 Inhaler    Refill:  6    Order Specific Question:   Supervising Provider    Answer:   Charlott Rakes [[id number]]  . glimepiride (AMARYL) 4 MG tablet    Sig: Take 1 tablet (4 mg total) by mouth daily before breakfast.    Dispense:  30 tablet    Refill:  5     Order Specific Question:   Supervising Provider    Answer:   Charlott Rakes [[id number]]    Follow-up: Return in about 1 month (around 01/03/2018) for Glucometer readings and tobacco use disorder.   Clent Demark PA

## 2017-12-06 NOTE — Patient Instructions (Signed)
Community Resources  Advocacy/Legal Legal Aid [place]:  1-866-219-5262  /  336-272-0148  Family Justice Center:  336-641-7233  [facility] [place] 24-hr Crisis line:  336-273-7273  Women's Resource Center, [place]:  336-275-6090  Court Watch (custody):  336-275-2346  [place] [facility]:   336-279-9299    Baby & Breastfeeding Car Seat Inspection @ Various [place] Fire Depts.- call 336-373-2177  [place] [facility]  336-832-6860  [facility] 336-878-6712  WIC: 336-641-3663 ([place]);  336-641-7571 ([place])  La Leche League:  1-877-452-5321   Childcare [place] [facility]: 336-369-5097 ([place]) / 336-887-8224 ([place])  - Child Care Resources/ Referrals/ Scholarships  - Head Start/ Early Head Start (call or apply online)  [place] DHHS: [place] Pre-[place] :  1-800-859-0829 / 336-274-5437   Employment / Job Search [facility] [place]: 336-275-6090 / [address]  [place] [facility] [employer] [facility] (JobLink): 336-373-5922 ([place]) / 336-882-4141 ([place])  [facility] Community Resource/ Career Center: 336-275-9801 / 336-282-7307  [facility] Job & Career Center: 336-373-3764  DHHS Work First: 336-641-3447 ([place]) / 336-641-3447 ([place])  [facility] [place]:  336-676-5871   Financial Assistance [facility]:  336-553-2657  [employer]: 336-235-0368  [employer] (furniture):  336-370-4002  [place] [facility]: 336-373-4264  Low Income Energy Assistance  336-641-3000   Food Assistance DHHS- SNAP/ Food Stamps: 336-641-4588  WIC: [place]- 336-641-3663 ;  [place] 336-641-7571  Little Green Book- Free Meals  Little Blue Book- Free Food Pantries  During the summer, text "FOOD" to 877877   General Health / Clinics (Adults) Orange Card (for Adults) through [facility]: (336) 895-4900  [facility]:   336-832-8035  [facility]:   336-832-4444  Health Department:  336-641-3245  [facility]:  336-415-3877 / 336-641-2100  [facility] [place]:   336-373-0678  [facility]:   336-334-4822 x 50251   Housing [place] Housing Coalition:   336-691-9521  [place] Housing Authority:  336-275-8501  Affordable Housing Managemnt:  336-273-0568   Immigrant/ Refugee Center for [place]ians (UNCG):  336-256-1065  [facility]:  336-379-0037  [employer]:  336-937-4701  [employer] Services:  336-617-0381  African Services Coalition:  336-574-2677   LGBTQ YouthSAFE  www.youthsafe[place].org  PFLAG  336-541-6754 / info@[place].org  The Trevor Project:  1-866-488-7386   Mental Health/ Substance Use [facility]  336-387-6161  [facility]:  336-832-9700 or 1-800-711-2635  [facility] of Care:  336-271-5888  [facility]:  336-294-1349  [facility]:  336-542-2884  [facility] (walk-ins)  336-676-6840 / [address]  [place]:  800-449-1287  Alcoholics Anonymous:  336-854-4278  Narcotics Anonymous:  800-365-1036  Quit Smoking Hotline:  800-QUIT-NOW (800-784-8669)   Parenting Children's Home Society:  800-632-1400  [place]: Education Center & Support Groups:  336-832-6682  YWCA: 336-273-3461  [employer]CG: Bringing Out the Best:  336-334-3120               Thriving at Three (Hispanic families): 336-256-1066  Healthy Start ([facility]):  336-387-6161 x2288  Parents as Teachers:  336-691-0024  [place] [facility]- Learning Together (Immigrants): 336-369-5001   Poison Control 800-222-1222  Sports & Recreation [employer] Open Doors Application: ymcanwnc.org/join/open-doors-financial-assistance/  City of [place] Recreation Centers: http://www.[place].gov/index.aspx?page=3615   Special Needs Family Support Network:  336-832-6507  Autism Society of [place]:   336-333-0197 x1402 or x1412 /  800-785-1035  TEACCH :  336-334-5773     ARC of [place]:  336-373-1076  Children's Developmental Service Agency (CDSA):  336-334-5601  CC4C (Care Coordination for Children):  336-641-7641   Transportation Medicaid Transportation: 336-641-4848 to apply  [place] Transit Authority: 336-335-6499 (reduced-fare bus ID to Medicaid/ Medicare/ Orange Card)  SCAT Paratransit services: Eligible riders only, call 336-333-6589 for application   Tutoring/Mentoring Black Child Development Institute: 336-230-2138  Big Brothers/ Big Sisters: 336-378-9100 ([place])  336-882-4167 ([place])  ACES through child's school: 336-370-2321  [employer] Achievers: contact your local Y  SHIELD Mentor Program: 336-337-2771   

## 2017-12-07 ENCOUNTER — Ambulatory Visit (INDEPENDENT_AMBULATORY_CARE_PROVIDER_SITE_OTHER): Payer: Medicaid Other | Admitting: Surgery

## 2017-12-07 LAB — TSH: TSH: 1.09 u[IU]/mL (ref 0.450–4.500)

## 2017-12-08 ENCOUNTER — Telehealth: Payer: Self-pay

## 2017-12-08 NOTE — Telephone Encounter (Signed)
-----   Message from Loletta Specteroger David Gomez, PA-C sent at 12/07/2017  9:00 AM EDT ----- Thyroid normal.

## 2017-12-08 NOTE — Telephone Encounter (Signed)
Patient aware of normal thyroid. [name], CMA  

## 2017-12-11 ENCOUNTER — Ambulatory Visit (INDEPENDENT_AMBULATORY_CARE_PROVIDER_SITE_OTHER): Payer: Medicaid Other | Admitting: Specialist

## 2017-12-13 ENCOUNTER — Encounter: Payer: Self-pay | Admitting: Adult Health

## 2017-12-13 ENCOUNTER — Ambulatory Visit: Payer: Medicaid Other | Admitting: Adult Health

## 2017-12-13 ENCOUNTER — Telehealth: Payer: Self-pay

## 2017-12-13 NOTE — Telephone Encounter (Signed)
Patient cancel appt the same day. Pt no show.

## 2017-12-21 ENCOUNTER — Encounter: Payer: Self-pay | Admitting: Adult Health

## 2017-12-21 ENCOUNTER — Ambulatory Visit: Payer: Medicaid Other | Admitting: Adult Health

## 2017-12-21 VITALS — BP 112/77 | HR 85 | Ht 71.0 in | Wt 247.1 lb

## 2017-12-21 DIAGNOSIS — G43109 Migraine with aura, not intractable, without status migrainosus: Secondary | ICD-10-CM

## 2017-12-21 DIAGNOSIS — I639 Cerebral infarction, unspecified: Secondary | ICD-10-CM

## 2017-12-21 DIAGNOSIS — I10 Essential (primary) hypertension: Secondary | ICD-10-CM

## 2017-12-21 DIAGNOSIS — E669 Obesity, unspecified: Secondary | ICD-10-CM

## 2017-12-21 DIAGNOSIS — E1169 Type 2 diabetes mellitus with other specified complication: Secondary | ICD-10-CM | POA: Diagnosis not present

## 2017-12-21 MED ORDER — ASPIRIN 81 MG PO TABS: 81.0000 mg | ORAL_TABLET | Freq: Every day | ORAL | Status: DC

## 2017-12-21 NOTE — Progress Notes (Signed)
[facility] [address] [place]. [place] [address] [phone]       OFFICE FOLLOW UP NOTE  [name] Date of Birth:  09/30/1970 Medical Record Number:  [id number]   Reason for Referral:  hospital complicated migraine follow up  CHIEF COMPLAINT:  Chief Complaint  Patient presents with  . Follow-up    Complicated migraine with visual aura room 9 pt is alone    HPI: [name] is being seen today for initial visit in the office for complicated migraine on 9/98/33. History obtained from patient and chart review. Reviewed all radiology images and labs personally.  [name] is a 47 y.o. transgender male with history of DM, HTN, HIV, perirectal abscess, smoker admitted for intermittent visual disturbance with HA for 2 months.   MRI reports right occipital punctate infarcts however per Dr. Phoebe Sharps notes he did not believe there is an acute infarct on the MRI scan.  CTA head and neck showed mild bilateral ICA proximal stenosis.  LDL 129 and patient was discharged on Lipitor 20 mg daily.  A1c 12.9 and recommended tight glycemic control but also advised patient to obtain PCP in order to better control A1c level.  Patient was not on antithrombotic PTA and recommended aspirin 81 mg daily.  Recommended Topamax with titration for headache management.  Patient was discharged in stable condition.  Patient is being seen today for hospital follow-up.  She states that overall she is been doing well without additional or migraines since hospital discharge.  She continues to take Topamax 25 mg at night.  Patient was frustrated in beginning appointment because she felt as though she was getting mixed answers at the hospital and was unsure exactly what was going on.  She is also fractured with a diagnosis of migraines due to never having migraines before nor family history.  Patient was also told during inpatient that she did in fact have a stroke but then was confused due to  them being told that she did not have a stroke.  MRI scan reviewed with patient and clarified Dr. Phoebe Sharps opinion.  Also reiterated that LP was not needed at that time for HIV/CNS infection reasons as patient was under the impression that she was told this needed to be done.  Patient was also not aware to be discharged on aspirin 81 mg as this was not on her medication list at discharge.  Patient is being followed by Dr. Altamease Oiler PCP for cholesterol, blood pressure and diabetes control.  Blood pressure satisfactory today at 112/71.  Patient states that she has been having a difficult time sleeping at night where she feels excessive daytime fatigue but then when she lays down to actually sleep at night, she states that her heart is racing thinking about different things and need to get done or things that she could be doing.  Patient has never undergone OSA testing or has a history.  Patient is on Lexapro for depression/anxiety.  Patient denies any other neurological concern.    ROS:   14 system review of systems performed and negative with exception of shortness of breath, wheezing, depression, anxiety, none of sleep, decreased energy, just interest in activities, racing thoughts, insomnia, sleepiness and restless legs  PMH:  Past Medical History:  Diagnosis Date  . Diabetes mellitus   . HIV disease ([place])   . Hypertension   . MRSA carrier   . Perirectal abscess 03/30/2017  . Polyuria 04/27/2015  . Poorly controlled diabetes  mellitus ([place]) 04/27/2015  . Testicular mass 06/20/2016  . Transgender 04/27/2015    PSH:  Past Surgical History:  Procedure Laterality Date  . APPENDECTOMY      Social History:  Social History   Socioeconomic History  . Marital status: Single    Spouse name: Not on file  . Number of children: Not on file  . Years of education: Not on file  . Highest education level: Not on file  Occupational History  . Not on file  Social Needs  . Financial resource strain: Not on  file  . Food insecurity:    Worry: Not on file    Inability: Not on file  . Transportation needs:    Medical: Not on file    Non-medical: Not on file  Tobacco Use  . Smoking status: Current Every Day Smoker    Packs/day: 1.00    Years: 27.00    Pack years: 27.00    Types: Cigarettes  . Smokeless tobacco: Never Used  Substance and Sexual Activity  . Alcohol use: No    Alcohol/week: 0.0 oz  . Drug use: Yes    Types: Marijuana    Comment: some daily  . Sexual activity: Yes    Partners: Male    Comment: pt. given condoms  Lifestyle  . Physical activity:    Days per week: Not on file    Minutes per session: Not on file  . Stress: Not on file  Relationships  . Social connections:    Talks on phone: Not on file    Gets together: Not on file    Attends religious service: Not on file    Active member of club or organization: Not on file    Attends meetings of clubs or organizations: Not on file    Relationship status: Not on file  . Intimate partner violence:    Fear of current or ex partner: Not on file    Emotionally abused: Not on file    Physically abused: Not on file    Forced sexual activity: Not on file  Other Topics Concern  . Not on file  Social History Narrative  . Not on file    Family History: History reviewed. No pertinent family history.  Medications:   Current Outpatient Medications on File Prior to Visit  Medication Sig Dispense Refill  . ACCU-CHEK AVIVA PLUS test strip 1 each by Other route 3 (three) times daily. 100 each 5  . ACCU-CHEK SOFTCLIX LANCETS lancets 1 each by Other route 3 (three) times daily. 100 each 5  . albuterol (PROVENTIL HFA;VENTOLIN HFA) 108 (90 Base) MCG/ACT inhaler Inhale 2 puffs into the lungs every 6 (six) hours as needed for wheezing or shortness of breath. 1 Inhaler 6  . bictegravir-emtricitabine-tenofovir AF (BIKTARVY) 50-200-25 MG TABS tablet Take 1 tablet by mouth daily. 30 tablet 11  . blood glucose meter kit and supplies  Dispense based on patient and insurance preference. Use up to four times daily as directed. (FOR ICD-10 E10.9, E11.9). 1 each 0  . escitalopram (LEXAPRO) 20 MG tablet Take 1 tablet (20 mg total) by mouth at bedtime. 30 tablet 5  . glimepiride (AMARYL) 4 MG tablet Take 1 tablet (4 mg total) by mouth daily before breakfast. 30 tablet 5  . hydrOXYzine (ATARAX/VISTARIL) 10 MG tablet Take 1 tablet (10 mg total) by mouth 2 (two) times daily. 60 tablet 1  . metFORMIN (GLUCOPHAGE) 500 MG tablet Take 2 tablets (1,000 mg total) by mouth 2 (  two) times daily with a meal. Take ONE 500 BID x 2 weeks then 2 tabs BID 120 tablet 11  . spironolactone (ALDACTONE) 50 MG tablet Take 1 tablet (50 mg total) by mouth daily. 30 tablet 5  . topiramate (TOPAMAX) 25 MG tablet 25 mg QHS x 6 days then 25 mg BID (Patient taking differently: Take by mouth at bedtime. 25 mg QHS x 6 days then 25 mg BID) 66 tablet 0  . varenicline (CHANTIX STARTING MONTH PAK) 0.5 MG X 11 & 1 MG X 42 tablet Take one 0.5 mg tablet by mouth once daily for 3 days, then increase to one 0.5 mg tablet twice daily for 4 days, then increase to one 1 mg tablet twice daily. 53 tablet 0   No current facility-administered medications on file prior to visit.     Allergies:   Allergies  Allergen Reactions  . Propoxyphene N-Acetaminophen Other (See Comments)    Stomach cramps     Physical Exam  Vitals:   12/21/17 0835  BP: 112/77  Pulse: 85  Weight: 247 lb 1.6 oz (112.1 kg)  Height: _0  (1.803 m)   Body mass index is 34.46 kg/m. No exam data present  General: Obese Caucasian transgender male, seated, in no evident distress Head: head normocephalic and atraumatic.   Neck: supple with no carotid or supraclavicular bruits Cardiovascular: regular rate and rhythm, no murmurs Musculoskeletal: no deformity Skin:  no rash/petichiae Vascular:  Normal pulses all extremities  Neurologic Exam Mental Status: Awake and fully alert. Oriented to place  and time. Recent and remote memory intact. Attention span, concentration and fund of knowledge appropriate. Mood and affect appropriate.  Cranial Nerves: Fundoscopic exam reveals sharp disc margins. Pupils equal, briskly reactive to light. Extraocular movements full without nystagmus. Visual fields full to confrontation. Hearing intact. Facial sensation intact. Face, tongue, palate moves normally and symmetrically.  Motor: Normal bulk and tone. Normal strength in all tested extremity muscles. Sensory.: intact to touch , pinprick , position and vibratory sensation.  Coordination: Rapid alternating movements normal in all extremities. Finger-to-nose and heel-to-shin performed accurately bilaterally. Gait and Station: Arises from chair without difficulty. Stance is normal. Gait demonstrates normal stride length and balance . Able to heel, toe and tandem walk without difficulty.  Reflexes: 1+ and symmetric. Toes downgoing.   Diagnostic Data (Labs, Imaging, Testing)  MR BRAIN W WO CONTRAST 10/31/2017 IMPRESSION: 1. Small focus of signal abnormality at the right occipital pole as above, most consistent with a small subacute ischemic infarct. 2. Otherwise normal brain MRI.  CT ANGIO HEAD W OR WO CONTRAST CT ANGIO NECK W OR WO CONTRAST 10/31/2017 IMPRESSION: 1. Negative CTA with no large vessel occlusion identified. 2. 50% atheromatous stenosis at the proximal right ICA. 3. Mild atheromatous narrowing at the proximal left ICA with stenosis of up to 35-40% by NASCET criteria. 4. Widely patent vertebral arteries within the neck. 5. No hemodynamically significant or correctable stenosis within the intracranial circulation.    ASSESSMENT: [name] is a 47 y.o. year old male here with a complicated migraine on 2/63/33. Vascular risk factors include HTN, HLD, and DM.     PLAN: -Start aspirin 81 mg daily for stroke prevention -It was agreed during this appointment for patient to stop  Topamax 25 mg nightly that she believes this is not needed -recommended to restart this medication if she feels as though migraines are restarting -F/u with PCP regarding your HLD, HTN and DM management -continue to monitor  BP at home -Continue to stay active and eat healthy -Maintain strict control of hypertension with blood pressure goal below 130/90, diabetes with hemoglobin A1c goal below 6.5% and cholesterol with LDL cholesterol (bad cholesterol) goal below 70 mg/dL. I also advised the patient to eat a healthy diet with plenty of whole grains, cereals, fruits and vegetables, exercise regularly and maintain ideal body weight.  Follow up in 6 months or call earlier if needed   Greater than 50% of time during this 25 minute visit was spent on counseling,explanation of diagnosis of complicated migraine, reviewing risk factor management of HTN, HLD and DM, planning of further management, discussion with patient and family and coordination of care   Venancio Poisson, [facility]  [facility] [address] [place], [place] [address]  Phone [phone] Fax [phone]

## 2017-12-21 NOTE — Progress Notes (Signed)
I agree with the above plan 

## 2017-12-21 NOTE — Patient Instructions (Addendum)
Start aspirin 81 mg daily stroke prevention  Stop Topamax 25mg  as you have not had issues with headaches  Continue to follow up with PCP regarding cholesterol, diabetes and blood pressure management   Maintain strict control of hypertension with blood pressure goal below 130/90, diabetes with hemoglobin A1c goal below 6.5% and cholesterol with LDL cholesterol (bad cholesterol) goal below 70 mg/dL. I also advised the patient to eat a healthy diet with plenty of whole grains, cereals, fruits and vegetables, exercise regularly and maintain ideal body weight.  Followup in the future with me in 6 months or call earlier if needed       Thank you for [facility]. I hope we have been able to provide you high quality care today.  You may receive a patient satisfaction survey over the next few weeks. We would appreciate your feedback and comments so that we may continue to improve ourselves and the health of our patients.

## 2018-01-04 ENCOUNTER — Ambulatory Visit (INDEPENDENT_AMBULATORY_CARE_PROVIDER_SITE_OTHER): Payer: Medicaid Other | Admitting: Physician Assistant

## 2018-01-19 MED FILL — SPIRONOLACTONE 50 MG TABS: 50 | 30 days supply | Qty: 30 | Fill #4

## 2018-01-19 MED FILL — hydrOXYzine HCL 10 MG TABS: 10 | 30 days supply | Qty: 60 | Fill #1

## 2018-01-19 MED FILL — metFORMIN HCL 500 MG TABS: 500 | 30 days supply | Qty: 120 | Fill #7

## 2018-01-19 MED FILL — ACCU-CHEK FASTCLIX LANCETS: 34 days supply | Qty: 102 | Fill #1

## 2018-01-19 MED FILL — ACCU-CHEK GUIDE TEST STRIP: 33 days supply | Qty: 100 | Fill #1

## 2018-01-19 MED FILL — ESCITALOPRAM 20 MG TABLET: 20 | 30 days supply | Qty: 30 | Fill #4

## 2018-01-19 MED FILL — BIKTARVY 50-200-25 MG TABS: 50-200-25 | 30 days supply | Qty: 30 | Fill #7

## 2018-01-19 MED FILL — PROAIR HFA 90 MCG INHALER: 108 (90 BAS | 25 days supply | Qty: 9 | Fill #1

## 2018-01-19 MED FILL — GLIMEPIRIDE 4 MG TABLET: 4 | 30 days supply | Qty: 30 | Fill #1

## 2018-02-07 ENCOUNTER — Ambulatory Visit (INDEPENDENT_AMBULATORY_CARE_PROVIDER_SITE_OTHER): Payer: Medicaid Other | Admitting: Specialist

## 2018-02-15 ENCOUNTER — Other Ambulatory Visit (INDEPENDENT_AMBULATORY_CARE_PROVIDER_SITE_OTHER): Payer: Self-pay | Admitting: Physician Assistant

## 2018-02-15 DIAGNOSIS — F418 Other specified anxiety disorders: Secondary | ICD-10-CM

## 2018-02-15 DIAGNOSIS — G4700 Insomnia, unspecified: Secondary | ICD-10-CM

## 2018-02-15 MED FILL — ESCITALOPRAM 20 MG TABLET: 20 | 30 days supply | Qty: 30 | Fill #5

## 2018-02-15 MED FILL — metFORMIN HCL 500 MG TABS: 500 | 30 days supply | Qty: 120 | Fill #8

## 2018-02-15 MED FILL — SPIRONOLACTONE 50 MG TABS: 50 | 30 days supply | Qty: 30 | Fill #5

## 2018-02-15 MED FILL — GLIMEPIRIDE 4 MG TABLET: 4 | 30 days supply | Qty: 30 | Fill #2

## 2018-02-15 MED FILL — BIKTARVY 50-200-25 MG TABS: 50-200-25 | 30 days supply | Qty: 30 | Fill #8

## 2018-02-15 MED FILL — hydrOXYzine HCL 10 MG TABS: 10 | 30 days supply | Qty: 60 | Fill #0

## 2018-02-15 NOTE — Telephone Encounter (Signed)
FWD to PCP. Tempestt S Roberts, CMA  

## 2018-02-22 ENCOUNTER — Encounter (INDEPENDENT_AMBULATORY_CARE_PROVIDER_SITE_OTHER): Payer: Self-pay | Admitting: *Deleted

## 2018-02-22 VITALS — BP 111/75 | HR 93 | Temp 98.0°F | Wt 238.8 lb

## 2018-02-22 DIAGNOSIS — Z006 Encounter for examination for normal comparison and control in clinical research program: Secondary | ICD-10-CM

## 2018-02-22 LAB — HIV-1 RNA QUANT-NO REFLEX-BLD

## 2018-02-22 NOTE — Progress Notes (Unsigned)
[name] is here for her week 288 visit for A5322. Complaining of cold symptoms which started last week. Productive cough with clear to white sputum. States that the cough got so bad last night that she had difficult catching her breath. No acute distress note. Bilateral wheezing noted. Has been using her albuterol inhaler. Agreed to call PCP today to schedule an appointment. She said that she also has an appointment tomorrow with Dr. [name] for increase lower back pain with numbness to (R) leg/toes. She said this all started about 2 months ago. Denied any injury. She was recently hospitalized for possible stroke and uncontrolled diabetes.  Work-up revealed symptoms were result of migraine headache. She is being followed by neurology and her PCP is monitoring her diabetes. She is scheudled to see Dr. [name] in October and will return in February for her next study visit.

## 2018-02-23 ENCOUNTER — Telehealth (INDEPENDENT_AMBULATORY_CARE_PROVIDER_SITE_OTHER): Payer: Self-pay | Admitting: Specialist

## 2018-02-23 ENCOUNTER — Encounter (INDEPENDENT_AMBULATORY_CARE_PROVIDER_SITE_OTHER): Payer: Self-pay | Admitting: Specialist

## 2018-02-23 ENCOUNTER — Ambulatory Visit (INDEPENDENT_AMBULATORY_CARE_PROVIDER_SITE_OTHER): Payer: Medicaid Other

## 2018-02-23 ENCOUNTER — Ambulatory Visit (INDEPENDENT_AMBULATORY_CARE_PROVIDER_SITE_OTHER): Payer: Medicaid Other | Admitting: Specialist

## 2018-02-23 VITALS — BP 126/85 | HR 92 | Ht 71.0 in | Wt 238.0 lb

## 2018-02-23 DIAGNOSIS — M5416 Radiculopathy, lumbar region: Secondary | ICD-10-CM

## 2018-02-23 DIAGNOSIS — M5136 Other intervertebral disc degeneration, lumbar region: Secondary | ICD-10-CM | POA: Diagnosis not present

## 2018-02-23 DIAGNOSIS — M545 Low back pain: Secondary | ICD-10-CM

## 2018-02-23 DIAGNOSIS — M4316 Spondylolisthesis, lumbar region: Secondary | ICD-10-CM | POA: Diagnosis not present

## 2018-02-23 LAB — COMPREHENSIVE METABOLIC PANEL
AG Ratio: 1.5 (calc) (ref 1.0–2.5)
ALT: 24 U/L (ref 9–46)
AST: 16 U/L (ref 10–40)
Albumin: 4.3 g/dL (ref 3.6–5.1)
Alkaline phosphatase (APISO): 106 U/L (ref 40–115)
BILIRUBIN TOTAL: 0.6 mg/dL (ref 0.2–1.2)
BUN: 18 mg/dL (ref 7–25)
CALCIUM: 9.5 mg/dL (ref 8.6–10.3)
CO2: 25 mmol/L (ref 20–32)
Chloride: 102 mmol/L (ref 98–110)
Creat: 0.89 mg/dL (ref 0.60–1.35)
Globulin: 2.8 g/dL (calc) (ref 1.9–3.7)
Glucose, Bld: 183 mg/dL — ABNORMAL HIGH (ref 65–99)
Potassium: 4.2 mmol/L (ref 3.5–5.3)
SODIUM: 138 mmol/L (ref 135–146)
TOTAL PROTEIN: 7.1 g/dL (ref 6.1–8.1)

## 2018-02-23 LAB — HEPATITIS B SURFACE ANTIBODY,QUALITATIVE: HEP B S AB: NONREACTIVE

## 2018-02-23 LAB — LIPID PANEL
CHOLESTEROL: 144 mg/dL (ref <200)
HDL: 30 mg/dL — ABNORMAL LOW (ref >40)
LDL CHOLESTEROL (CALC): 87 mg/dL
Non-HDL Cholesterol (Calc): 114 mg/dL (calc) (ref <130)
Total CHOL/HDL Ratio: 4.8 (calc) (ref <5.0)
Triglycerides: 160 mg/dL — ABNORMAL HIGH (ref <150)

## 2018-02-23 LAB — HEPATITIS B SURFACE ANTIGEN: Hepatitis B Surface Ag: NONREACTIVE

## 2018-02-23 LAB — HEMOGLOBIN A1C
EAG (MMOL/L): 11.6 (calc)
Hgb A1c MFr Bld: 8.9 % of total Hgb — ABNORMAL HIGH (ref <5.7)
MEAN PLASMA GLUCOSE: 209 (calc)

## 2018-02-23 LAB — HEPATITIS C ANTIBODY
HEP C AB: NONREACTIVE
SIGNAL TO CUT-OFF: 0.04 (ref <1.00)

## 2018-02-23 LAB — PROTEIN / CREATININE RATIO, URINE
CREATININE, URINE: 210 mg/dL (ref 20–320)
PROTEIN/CREAT RATIO: 348 mg/g{creat} — AB (ref 22–128)
Total Protein, Urine: 73 mg/dL — ABNORMAL HIGH (ref 5–25)

## 2018-02-23 MED ORDER — TRAMADOL HCL 50 MG PO TABS: 50.0000 mg | ORAL_TABLET | Freq: Four times a day (QID) | ORAL | 0 refills | Status: DC | PRN

## 2018-02-23 MED ORDER — DICLOFENAC SODIUM 50 MG PO TBEC: 50.0000 mg | DELAYED_RELEASE_TABLET | Freq: Three times a day (TID) | ORAL | 3 refills | Status: DC

## 2018-02-23 MED ORDER — GABAPENTIN 300 MG PO CAPS: ORAL_CAPSULE | ORAL | 3 refills | Status: DC

## 2018-02-23 NOTE — Telephone Encounter (Signed)
Kristen [facility] called and stated need RX diclofenac (VOLTAREN) 50 MG EC changed from  50 mg 3 x a day to 75 twice a day because insurance will not pay for the 50mg .  Please call [facility]ire[facility]risten as soon as possible @ [phone]

## 2018-02-23 NOTE — Progress Notes (Addendum)
Office Visit Note   Patient: [name]           Date of Birth: [birth year]           MRN: [id number] Visit Date: 02/23/2018              Requested by: [name], PA-C [id number] [place], [place] [address] PCP: [name], PA-C   Assessment & Plan: Visit Diagnoses:  1. Low back pain, unspecified back pain laterality, unspecified chronicity, with sciatica presence unspecified   2. Spondylolisthesis, lumbar region   3. DDD (degenerative disc disease), lumbar   4. Radiculopathy, lumbar region    47 year old transexual, prefers male, [name]. He has diabetes with a recent severe period of out of control and Visual disturbance he has a history of HIV and is on antiviral medication. Currently has a cough and I have advised him to  Contact his primary care physician as he is at risk of developing pulmonary infections and it is easier to treat if recognized  Early. He has history and clinical exam consistent with L5-S1spondylolisthesis that is grade 2 with significant bilateral formamenal Stenosis. His symptoms have been long standing and he has been through a period of pain management though managed Mainly by his infectious disease MD, Dr. [name]. He has mechanical back pain and a obvious right L5 radiculopathy with  Ongoing right side sciatica. I recommend he be taught a flexion exercise program as he has not been to PT in nearly 7 years. Undergo right L5 transforamenal ESI to try and aleviate the right sciatica. Start neuromembrane stabilizer, gabapentin which Would help probable diabetic neuropathy and compressive neuropathy. NSAIDS for spondylosis changes and to decrease Nerve and joint inflamation. Tramadol for more severe discomfort. I explained that narcotics do not change the problem at All and only lead to addiction. I am not optimistic as to the prognosis concerning his back with conservative management due To the grade being grade 2, the findings of  focal radiculopathy attributable to the site of spondylolisthesis. If no response to Conservative measures then I will obtain repeat MRI and consider decompression and fusion. I explained to [name] that the Results of surgical treatment for his back condition show a uniformly good result with expected improvement in standing, and  Walking tolerance. Will try PT and ESI first.  Plan: Avoid bending, stooping and avoid lifting weights greater than 10 lbs. Avoid prolong standing and walking. Avoid frequent bending and stooping  No lifting greater than 10 lbs. May use ice or moist heat for pain. Weight loss is of benefit. Handicap license is approved. Dr. Crawford Blas secretary/Assistant will call to arrange for epidural steroid injection  Diclofenac 50 mg one up to TID Gabapentin 300mg  ramp up to TID. Tramadol 50 mg,  One  Po every 6 hours.  Follow-Up Instructions: Return in about 4 weeks (around 03/23/2018).   Orders:  Orders Placed This Encounter  Procedures  . XR Lumbar Spine 2-3 Views   No orders of the defined types were placed in this encounter.     Procedures: No procedures performed   Clinical Data: Findings:  CT from 2012 lumbar spine shows bilateral spondylolysis L5-S1 with anterolisthesis at this level that is grade 2, rounding of the superior endplate of S1. There is severe foramenal narrowing bilateral L5 neuroforamen due subluxation of the L5 remaining superior portion of the inferior articular process entering the neuroforamen, this is worse on the left compared with the  right but present and severe bilaterally. Moderate arthrosis changes of the L4-5 facets with minimal narrowing of the neuroforamen at L4 due to disc bulge and facet arthrosis.   MRI lumbar spine 2012 show grade 2 spondylolisthesis L5-S1 14 mm, bilateral severe foramenal narrowing L5,  Mild L4. Mild degenerative disc protrusions L3-4, L4-5 without central stenosis. Degenerative disc disease L5-S1 With disc  protrusion centrally at L5-S1 and into the neuroforamen bilaterally.      Subjective: Chief Complaint  Patient presents with  . Lower Back - Follow-up    47 year old with several years history of back pain and radiation into the right leg. In the past has taken up  To 30 mg MS tid and intermittant percocet between. Dr. [name] prescribed pain medications until laws changed and he discontinued the meds. Post stopping the pain has worsened and it is more often and for  Reasons unknown. He sits at a table and uses a sewing machine. He has difficulty sleeping at night and  Has a hard time getting relief. Bending, stooping, sitting on commode, getting into clothes is difficutly. Standing from sitting at the side of the bed and going to stand he had to sit down due to pain. Presently  Today the pain is a "9". His primary care MD is Doctor [name], with [facility]. He has diabetes, and in the past  Was seening ID for every thing. Now his primary care MD is Dr. [name]. He was hospitalized end of June  Till the beginning of July. He reports he was told he had a stroke. He was seeing a swirling sphere with bright colors showing through it. MRI with changes in the posterior brain, but not stroke. His Hgb A1c were high at 12. This was only in the left side with a hemianopsia. He has since recovered function. Hospitalized x 3 days, It took 6-7 days for the visual condition to subside. No bowel or bladder difficulties.He has trouble walking more than 50-177feet. He doesn't walk to the mail box due to difficulties going up the . Stairs are one At a time to get up to the drive way. The right leg is the main problem and the back it self. There is constandt numbness in the two toes and sometimes make it difficult to sleep. He has stabbing sensation into the lateral 2 toes. And into the big toe some stabbing sensations.  He tries not to lift much weights, he doesn't lift grocery bags, he takes some one with him to help. Lives  with his sister. He was born in [place] and raised in  [place], now has been in [place] 13 years. Has been to PT in the past when he had MRI in the past.He used a TENS without help. Only one kidney, smokes 1 ppd in the past was smoking 2 ppd.    Review of Systems  Constitutional: Positive for appetite change, fatigue and unexpected weight change. Negative for activity change, chills, diaphoresis and fever.  HENT: Positive for dental problem, postnasal drip, rhinorrhea and sneezing. Negative for congestion, drooling, ear discharge, ear pain, facial swelling, hearing loss, mouth sores, nosebleeds, sinus pressure, sinus pain, sore throat, tinnitus, trouble swallowing and voice change.   Eyes: Positive for visual disturbance. Negative for photophobia, pain, discharge, redness and itching.  Respiratory: Positive for cough, shortness of breath and wheezing. Negative for apnea, choking, chest tightness and stridor.   Cardiovascular: Negative.  Negative for chest pain, palpitations and leg swelling.  Gastrointestinal: Negative.  Negative for abdominal distention, abdominal pain, anal bleeding, blood in stool, constipation, diarrhea, nausea, rectal pain and vomiting.  Endocrine: Negative.  Negative for cold intolerance, heat intolerance, polydipsia, polyphagia and polyuria.  Genitourinary: Negative.  Negative for difficulty urinating, dysuria, enuresis, flank pain, frequency and hematuria.  Musculoskeletal: Positive for back pain and gait problem. Negative for arthralgias, joint swelling, myalgias, neck pain and neck stiffness.  Skin: Negative.  Negative for color change, pallor, rash and wound.  Allergic/Immunologic: Negative.  Negative for environmental allergies, food allergies and immunocompromised state.  Neurological: Positive for weakness and numbness. Negative for dizziness, seizures, facial asymmetry, speech difficulty, light-headedness and headaches.  Hematological: Negative.  Negative for  adenopathy. Does not bruise/bleed easily.  Psychiatric/Behavioral: Negative.  Negative for agitation, behavioral problems, confusion, decreased concentration, dysphoric mood, hallucinations, self-injury, sleep disturbance and suicidal ideas. The patient is not nervous/anxious and is not hyperactive.      Objective: Vital Signs: BP 126/85 (BP Location: Left Arm, Patient Position: Sitting)   Pulse 92   Ht 5\' 11"  (1.803 m)   Wt 238 lb (108 kg)   BMI 33.19 kg/m   Physical Exam  Constitutional: He is oriented to person, place, and time. He appears well-developed and well-nourished.  HENT:  Head: Normocephalic and atraumatic.  Eyes: Pupils are equal, round, and reactive to light. EOM are normal.  Neck: Normal range of motion. Neck supple.  Pulmonary/Chest: Effort normal. He has wheezes. He has rales.  Abdominal: Soft. Bowel sounds are normal.  Neurological: He is alert and oriented to person, place, and time.  Skin: Skin is warm and dry.  Psychiatric: He has a normal mood and affect. His behavior is normal. Judgment and thought content normal.    Back Exam   Tenderness  The patient is experiencing tenderness in the lumbar.  Range of Motion  Extension: 50  Flexion:  50 abnormal  Lateral bend right: abnormal  Lateral bend left: normal  Rotation right: abnormal  Rotation left: normal   Muscle Strength  Right Quadriceps:  5/5  Left Quadriceps:  5/5  Right Hamstrings:  5/5  Left Hamstrings:  5/5   Tests  Straight leg raise right: positive  Reflexes  Patellar: normal Achilles: Hyporeflexic  Other  Toe walk: abnormal Heel walk: abnormal Sensation: decreased Gait: antalgic  Erythema: no back redness Scars: absent  Comments:  Left ankle reflex 0, right is 2 +. Weak in the right foot DF and the right great toe EHL Limited bending tolerance and pushes off thighs to standup. Flattening of the lumbosacral area with decrease in lordosis.       Specialty Comments:  No  specialty comments available.  Imaging: No results found.   PMFS History: Patient Active Problem List   Diagnosis Date Noted  . Ischemic stroke (HCC) 10/31/2017  . Visual field defect   . Complicated migraine   . Migraine with visual aura   . Perirectal abscess 03/30/2017  . Essential hypertension 06/01/2016  . Diabetes mellitus type 2 in obese (HCC) 06/01/2016  . Poorly controlled diabetes mellitus (HCC) 04/27/2015  . Transgender 04/27/2015  . Hearing loss secondary to cerumen impaction 02/07/2013  . Smoker 03/07/2011  . Asthma 02/07/2008  . GENDER IDENTITY DISORDER 10/31/2007  . Depression with anxiety 10/17/2007  . Human immunodeficiency virus (HIV) disease (HCC) 04/14/2006  . HIDRADENITIS SUPPURATIVA 04/14/2006   Past Medical History:  Diagnosis Date  . Diabetes mellitus   . HIV disease (HCC)   . Hypertension   . MRSA carrier   .  Perirectal abscess 03/30/2017  . Polyuria 04/27/2015  . Poorly controlled diabetes mellitus (HCC) 04/27/2015  . Testicular mass 06/20/2016  . Transgender 04/27/2015    History reviewed. No pertinent family history.  Past Surgical History:  Procedure Laterality Date  . APPENDECTOMY     Social History   Occupational History  . Not on file  Tobacco Use  . Smoking status: Current Every Day Smoker    Packs/day: 1.00    Years: 27.00    Pack years: 27.00    Types: Cigarettes  . Smokeless tobacco: Never Used  Substance and Sexual Activity  . Alcohol use: No    Alcohol/week: 0.0 standard drinks  . Drug use: Yes    Types: Marijuana    Comment: some daily  . Sexual activity: Yes    Partners: Male    Comment: pt. given condoms

## 2018-02-23 NOTE — Patient Instructions (Signed)
Avoid bending, stooping and avoid lifting weights greater than 10 lbs. Avoid prolong standing and walking. Avoid frequent bending and stooping  No lifting greater than 10 lbs. May use ice or moist heat for pain. Weight loss is of benefit. Handicap license is approved. Dr. [place] BlasNewton's secretary/Assistant will call to arrange for epidural steroid injection  Diclofenac 50 mg one up to TID Gabapentin 300mg  ramp up to TID. Tramadol 50 mg,  One  Po every 6 hours.

## 2018-02-26 MED FILL — traMADol HCL 50 MG TABS: 50 | 13 days supply | Qty: 50 | Fill #0

## 2018-02-26 MED FILL — GABAPENTIN 300 MG CAPSULE: 300 | 34 days supply | Qty: 90 | Fill #0

## 2018-02-26 NOTE — Telephone Encounter (Signed)
Kristen from [facility] called and stated need RX diclofenac (VOLTAREN) 50 MG EC changed from  °50 mg 3 x a day to °75 twice a day because insurance will not pay for the 50mg. ° °Please call Kristen as soon as possible @ °[phone] °

## 2018-03-01 ENCOUNTER — Ambulatory Visit (HOSPITAL_COMMUNITY)
Admission: RE | Admit: 2018-03-01 | Discharge: 2018-03-01 | Disposition: A | Discharge status: Home / Self Care | Payer: Medicaid Other | Source: Ambulatory Visit | Attending: Physician Assistant | Admitting: Physician Assistant

## 2018-03-01 ENCOUNTER — Ambulatory Visit (INDEPENDENT_AMBULATORY_CARE_PROVIDER_SITE_OTHER): Payer: Medicaid Other | Admitting: Physician Assistant

## 2018-03-01 ENCOUNTER — Encounter (INDEPENDENT_AMBULATORY_CARE_PROVIDER_SITE_OTHER): Payer: Self-pay | Admitting: Physician Assistant

## 2018-03-01 ENCOUNTER — Other Ambulatory Visit: Payer: Self-pay

## 2018-03-01 VITALS — BP 138/82 | HR 98 | Temp 100.1°F | Ht 71.0 in | Wt 239.0 lb

## 2018-03-01 DIAGNOSIS — R5381 Other malaise: Secondary | ICD-10-CM

## 2018-03-01 DIAGNOSIS — R05 Cough: Secondary | ICD-10-CM | POA: Insufficient documentation

## 2018-03-01 DIAGNOSIS — R509 Fever, unspecified: Secondary | ICD-10-CM

## 2018-03-01 DIAGNOSIS — R918 Other nonspecific abnormal finding of lung field: Secondary | ICD-10-CM | POA: Diagnosis not present

## 2018-03-01 DIAGNOSIS — R5383 Other fatigue: Secondary | ICD-10-CM | POA: Diagnosis not present

## 2018-03-01 DIAGNOSIS — R059 Cough, unspecified: Secondary | ICD-10-CM

## 2018-03-01 MED ORDER — BENZONATATE 100 MG PO CAPS: 100.0000 mg | ORAL_CAPSULE | Freq: Two times a day (BID) | ORAL | 0 refills | Status: DC | PRN

## 2018-03-01 MED ORDER — AMOXICILLIN-POT CLAVULANATE 875-125 MG PO TABS: 1.0000 | ORAL_TABLET | Freq: Two times a day (BID) | ORAL | 0 refills | Status: AC

## 2018-03-01 MED ORDER — ALBUTEROL SULFATE HFA 108 (90 BASE) MCG/ACT IN AERS: 2.0000 | INHALATION_SPRAY | Freq: Four times a day (QID) | RESPIRATORY_TRACT | 6 refills | Status: DC | PRN

## 2018-03-01 MED ORDER — GUAIFENESIN ER 1200 MG PO TB12: 1.0000 | ORAL_TABLET | Freq: Two times a day (BID) | ORAL | 0 refills | Status: AC

## 2018-03-01 MED FILL — AMOX-CLAV 875-125 MG TABLET: 875-125 | 10 days supply | Qty: 20 | Fill #0

## 2018-03-01 MED FILL — PROAIR HFA 90 MCG INHALER: 108 (90 BAS | 25 days supply | Qty: 9 | Fill #0

## 2018-03-01 NOTE — Patient Instructions (Addendum)
Please go to [facility] for a chest xray upon leaving clinic today. Please pick up medications and begin taking as soon as you can today.    Community-Acquired Pneumonia, Adult Pneumonia is an infection of the lungs. One type of pneumonia can happen while a person is in a hospital. A different type can happen when a person is not in a hospital (community-acquired pneumonia). It is easy for this kind to spread from person to person. It can spread to you if you breathe near an infected person who coughs or sneezes. Some symptoms include:  A dry cough.  A wet (productive) cough.  Fever.  Sweating.  Chest pain.  Follow these instructions at home:  Take over-the-counter and prescription medicines only as told by your doctor. ? Only take cough medicine if you are losing sleep. ? If you were prescribed an antibiotic medicine, take it as told by your doctor. Do not stop taking the antibiotic even if you start to feel better.  Sleep with your head and neck raised (elevated). You can do this by putting a few pillows under your head, or you can sleep in a recliner.  Do not use tobacco products. These include cigarettes, chewing tobacco, and e-cigarettes. If you need help quitting, ask your doctor.  Drink enough water to keep your pee (urine) clear or pale yellow. A shot (vaccine) can help prevent pneumonia. Shots are often suggested for:  People older than 48 years of age.  People older than 48 years of age: ? Who are having cancer treatment. ? Who have long-term (chronic) lung disease. ? Who have problems with their body's defense system (immune system).  You may also prevent pneumonia if you take these actions:  Get the flu (influenza) shot every year.  Go to the dentist as often as told.  Wash your hands often. If soap and water are not available, use hand sanitizer.  Contact a doctor if:  You have a fever.  You lose sleep because your cough  medicine does not help. Get help right away if:  You are short of breath and it gets worse.  You have more chest pain.  Your sickness gets worse. This is very serious if: ? You are an older adult. ? Your body's defense system is weak.  You cough up blood. This information is not intended to replace advice given to you by your health care provider. Make sure you discuss any questions you have with your health care provider. Document Released: 11/16/2007 Document Revised: 11/05/2015 Document Reviewed: 09/24/2014 Elsevier Interactive Patient Education  [employer]2018 Elsevier Inc.

## 2018-03-01 NOTE — Progress Notes (Signed)
Subjective:  Patient ID: [name], male    DOB: 02/21/1971  Age: 47 y.o. MRN: [id number]  CC: flu like symptoms   HPI  [name] is a 47 y.o. male with a medical history of DM2, HIV, HTN, Tobacco use disorder, Testicular mass, perirectal abscess, DDD, LBP pain w/sciatica, depression, anxiety, and Transgenderism presents with seven day hx of cough, fever, chest congestion, malaise, fatigue, SOB, CP, myalgias, nausea, and lower rib pain due to coughing. Has been "forcing" up some mucus from the lungs and described the mucus as dark green. No close contacts with the same. Has been taking Mucinex with no relief. Does not endorse any other symptoms or complaints.    Outpatient Medications Prior to Visit  Medication Sig Dispense Refill  . ACCU-CHEK AVIVA PLUS test strip 1 each by Other route 3 (three) times daily. 100 each 5  . ACCU-CHEK SOFTCLIX LANCETS lancets 1 each by Other route 3 (three) times daily. 100 each 5  . albuterol (PROVENTIL HFA;VENTOLIN HFA) 108 (90 Base) MCG/ACT inhaler Inhale 2 puffs into the lungs every 6 (six) hours as needed for wheezing or shortness of breath. 1 Inhaler 6  . aspirin 81 MG tablet Take 1 tablet (81 mg total) by mouth daily. 30 tablet   . bictegravir-emtricitabine-tenofovir AF (BIKTARVY) 50-200-25 MG TABS tablet Take 1 tablet by mouth daily. 30 tablet 11  . blood glucose meter kit and supplies Dispense based on patient and insurance preference. Use up to four times daily as directed. (FOR ICD-10 E10.9, E11.9). 1 each 0  . escitalopram (LEXAPRO) 20 MG tablet Take 1 tablet (20 mg total) by mouth at bedtime. 30 tablet 5  . gabapentin (NEURONTIN) 300 MG capsule Take 1 capsule (300 mg total) by mouth at bedtime for 4 days, THEN 1 capsule (300 mg total) 2 (two) times daily for 4 days, THEN 1 capsule (300 mg total) 3 (three) times daily for 4 days. 90 capsule 3  . glimepiride (AMARYL) 4 MG tablet Take 1 tablet (4 mg total) by mouth daily before breakfast. 30  tablet 5  . hydrOXYzine (ATARAX/VISTARIL) 10 MG tablet TAKE 1 TABLET (10 MG TOTAL) BY MOUTH 2 (TWO) TIMES DAILY. 60 tablet 1  . metFORMIN (GLUCOPHAGE) 500 MG tablet Take 2 tablets (1,000 mg total) by mouth 2 (two) times daily with a meal. Take ONE 500 BID x 2 weeks then 2 tabs BID 120 tablet 11  . spironolactone (ALDACTONE) 50 MG tablet Take 1 tablet (50 mg total) by mouth daily. 30 tablet 5  . traMADol (ULTRAM) 50 MG tablet Take 1 tablet (50 mg total) by mouth every 6 (six) hours as needed. 50 tablet 0  . diclofenac (VOLTAREN) 50 MG EC tablet Take 1 tablet (50 mg total) by mouth 3 (three) times daily. (Patient not taking: Reported on 03/01/2018) 90 tablet 3  . varenicline (CHANTIX STARTING MONTH PAK) 0.5 MG X 11 & 1 MG X 42 tablet Take one 0.5 mg tablet by mouth once daily for 3 days, then increase to one 0.5 mg tablet twice daily for 4 days, then increase to one 1 mg tablet twice daily. (Patient not taking: Reported on 03/01/2018) 53 tablet 0   No facility-administered medications prior to visit.      ROS Review of Systems  Constitutional: Positive for fever and malaise/fatigue. Negative for chills.  HENT: Positive for congestion and sinus pain.   Eyes: Negative for blurred vision.  Respiratory: Positive for cough and shortness of breath.  Cardiovascular: Negative for chest pain and palpitations.  Gastrointestinal: Positive for nausea. Negative for abdominal pain.  Genitourinary: Negative for dysuria and hematuria.  Musculoskeletal: Positive for myalgias. Negative for joint pain.  Skin: Negative for rash.  Neurological: Negative for tingling and headaches.  Psychiatric/Behavioral: Negative for depression. The patient is not nervous/anxious.     Objective:  BP 138/82 (BP Location: Left Arm, Patient Position: Sitting, Cuff Size: Large)   Pulse 98   Temp 100.1 F (37.8 C) (Oral)   Ht '5\' 11"'  (1.803 m)   Wt 239 lb (108.4 kg)   SpO2 91%   BMI 33.33 kg/m   BP/Weight 03/01/2018  02/23/2018 2/77/8242  Systolic BP 353 614 431  Diastolic BP 82 85 75  Wt. (Lbs) 239 238 238.75  BMI 33.33 33.19 33.3      Physical Exam  Constitutional: He is oriented to person, place, and time.  Well developed, coughing, appears ill, reserved  HENT:  Head: Normocephalic and atraumatic.  Mouth/Throat: No oropharyngeal exudate.  Eyes: Conjunctivae are normal. No scleral icterus.  Neck: Normal range of motion. Neck supple. No thyromegaly present.  Cardiovascular: Normal rate, regular rhythm and normal heart sounds.  Pulmonary/Chest: Effort normal. He has no rales.  Taking deeper inspirations, few wheezes and rhonchi diffusely in the bilateral lung fields  Abdominal: Soft. Bowel sounds are normal. There is no tenderness.  Musculoskeletal: He exhibits no edema.  Lymphadenopathy:    He has cervical adenopathy.  Neurological: He is alert and oriented to person, place, and time.  Skin: Skin is warm and dry. No rash noted. No erythema. No pallor.  Psychiatric: He has a normal mood and affect. His behavior is normal. Thought content normal.  Vitals reviewed.    Assessment & Plan:    1. Cough - amoxicillin-clavulanate (AUGMENTIN) 875-125 MG tablet; Take 1 tablet by mouth 2 (two) times daily for 10 days.  Dispense: 20 tablet; Refill: 0 - benzonatate (TESSALON) 100 MG capsule; Take 1 capsule (100 mg total) by mouth 2 (two) times daily as needed for cough.  Dispense: 20 capsule; Refill: 0 - Guaifenesin (MUCINEX MAXIMUM STRENGTH) 1200 MG TB12; Take 1 tablet (1,200 mg total) by mouth 2 (two) times daily for 5 days.  Dispense: 10 tablet; Refill: 0 - albuterol (PROVENTIL HFA;VENTOLIN HFA) 108 (90 Base) MCG/ACT inhaler; Inhale 2 puffs into the lungs every 6 (six) hours as needed for wheezing or shortness of breath.  Dispense: 1 Inhaler; Refill: 6 - DG Chest 2 View; Future - Initial O2 sat was 91% RA. Gave patient 4L/min of O2 and raised O2 sat to 96%.   2. Fever, unspecified -  amoxicillin-clavulanate (AUGMENTIN) 875-125 MG tablet; Take 1 tablet by mouth 2 (two) times daily for 10 days.  Dispense: 20 tablet; Refill: 0 - benzonatate (TESSALON) 100 MG capsule; Take 1 capsule (100 mg total) by mouth 2 (two) times daily as needed for cough.  Dispense: 20 capsule; Refill: 0 - Guaifenesin (MUCINEX MAXIMUM STRENGTH) 1200 MG TB12; Take 1 tablet (1,200 mg total) by mouth 2 (two) times daily for 5 days.  Dispense: 10 tablet; Refill: 0 - albuterol (PROVENTIL HFA;VENTOLIN HFA) 108 (90 Base) MCG/ACT inhaler; Inhale 2 puffs into the lungs every 6 (six) hours as needed for wheezing or shortness of breath.  Dispense: 1 Inhaler; Refill: 6 - DG Chest 2 View; Future  3. Malaise and fatigue - amoxicillin-clavulanate (AUGMENTIN) 875-125 MG tablet; Take 1 tablet by mouth 2 (two) times daily for 10 days.  Dispense: 20 tablet; Refill:  0 - benzonatate (TESSALON) 100 MG capsule; Take 1 capsule (100 mg total) by mouth 2 (two) times daily as needed for cough.  Dispense: 20 capsule; Refill: 0 - Guaifenesin (MUCINEX MAXIMUM STRENGTH) 1200 MG TB12; Take 1 tablet (1,200 mg total) by mouth 2 (two) times daily for 5 days.  Dispense: 10 tablet; Refill: 0 - albuterol (PROVENTIL HFA;VENTOLIN HFA) 108 (90 Base) MCG/ACT inhaler; Inhale 2 puffs into the lungs every 6 (six) hours as needed for wheezing or shortness of breath.  Dispense: 1 Inhaler; Refill: 6 - DG Chest 2 View; Future   Meds ordered this encounter  Medications  . amoxicillin-clavulanate (AUGMENTIN) 875-125 MG tablet    Sig: Take 1 tablet by mouth 2 (two) times daily for 10 days.    Dispense:  20 tablet    Refill:  0    Order Specific Question:   Supervising Provider    Answer:   Charlott Rakes [[id number]]  . benzonatate (TESSALON) 100 MG capsule    Sig: Take 1 capsule (100 mg total) by mouth 2 (two) times daily as needed for cough.    Dispense:  20 capsule    Refill:  0    Order Specific Question:   Supervising Provider    Answer:   Charlott Rakes [[id number]]  . Guaifenesin (MUCINEX MAXIMUM STRENGTH) 1200 MG TB12    Sig: Take 1 tablet (1,200 mg total) by mouth 2 (two) times daily for 5 days.    Dispense:  10 tablet    Refill:  0    Order Specific Question:   Supervising Provider    Answer:   Charlott Rakes [[id number]]  . albuterol (PROVENTIL HFA;VENTOLIN HFA) 108 (90 Base) MCG/ACT inhaler    Sig: Inhale 2 puffs into the lungs every 6 (six) hours as needed for wheezing or shortness of breath.    Dispense:  1 Inhaler    Refill:  6    Order Specific Question:   Supervising Provider    Answer:   Charlott Rakes [[id number]]    Follow-up: Return in about 2 weeks (around 03/15/2018) for Cough.   Clent Demark PA

## 2018-03-02 ENCOUNTER — Encounter: Payer: Self-pay | Admitting: *Deleted

## 2018-03-02 ENCOUNTER — Telehealth (INDEPENDENT_AMBULATORY_CARE_PROVIDER_SITE_OTHER): Payer: Self-pay

## 2018-03-02 LAB — CD4/CD8 (T-HELPER/T-SUPPRESSOR CELL)
CD4 Count: 2331
CD4%: 51.8
CD8 % Suppressor T Cell: 25.6
CD8: 1152

## 2018-03-02 NOTE — Telephone Encounter (Signed)
Patient is aware that CXR reveals possible pneumonia but will need to finish antibiotic and then repeat CXR. Maryjean Mornempestt S Roberts, CMA

## 2018-03-02 NOTE — Telephone Encounter (Signed)
-----   Message from Loletta Specteroger David Gomez, PA-C sent at 03/01/2018  5:56 PM EDT ----- Possible pneumonia. Will need repeat CXR after finishing abx.

## 2018-03-07 ENCOUNTER — Ambulatory Visit: Payer: Medicaid Other | Attending: Specialist | Admitting: Physical Therapy

## 2018-03-07 ENCOUNTER — Telehealth (INDEPENDENT_AMBULATORY_CARE_PROVIDER_SITE_OTHER): Payer: Self-pay | Admitting: *Deleted

## 2018-03-08 ENCOUNTER — Encounter (INDEPENDENT_AMBULATORY_CARE_PROVIDER_SITE_OTHER): Payer: Self-pay | Admitting: Physical Medicine and Rehabilitation

## 2018-03-09 ENCOUNTER — Other Ambulatory Visit: Payer: Self-pay | Admitting: Pharmacist

## 2018-03-09 DIAGNOSIS — F321 Major depressive disorder, single episode, moderate: Secondary | ICD-10-CM

## 2018-03-09 DIAGNOSIS — B20 Human immunodeficiency virus [HIV] disease: Secondary | ICD-10-CM

## 2018-03-12 MED FILL — ESCITALOPRAM 20 MG TABLET: 20 | 30 days supply | Qty: 30 | Fill #0

## 2018-03-12 MED FILL — GLIMEPIRIDE 4 MG TABLET: 4 | 30 days supply | Qty: 30 | Fill #3

## 2018-03-12 MED FILL — BIKTARVY 50-200-25 MG TABS: 50-200-25 | 30 days supply | Qty: 30 | Fill #9

## 2018-03-12 MED FILL — hydrOXYzine HCL 10 MG TABS: 10 | 30 days supply | Qty: 60 | Fill #1

## 2018-03-12 MED FILL — SPIRONOLACTONE 50 MG TABS: 50 | 30 days supply | Qty: 30 | Fill #0

## 2018-03-15 ENCOUNTER — Ambulatory Visit (INDEPENDENT_AMBULATORY_CARE_PROVIDER_SITE_OTHER): Payer: Medicaid Other | Admitting: Physician Assistant

## 2018-03-15 ENCOUNTER — Other Ambulatory Visit: Payer: Self-pay

## 2018-03-15 ENCOUNTER — Encounter (INDEPENDENT_AMBULATORY_CARE_PROVIDER_SITE_OTHER): Payer: Self-pay | Admitting: Physician Assistant

## 2018-03-15 ENCOUNTER — Other Ambulatory Visit (INDEPENDENT_AMBULATORY_CARE_PROVIDER_SITE_OTHER): Payer: Self-pay | Admitting: Physical Medicine and Rehabilitation

## 2018-03-15 VITALS — BP 118/84 | HR 96 | Temp 98.0°F | Ht 71.0 in | Wt 240.8 lb

## 2018-03-15 DIAGNOSIS — J181 Lobar pneumonia, unspecified organism: Secondary | ICD-10-CM

## 2018-03-15 DIAGNOSIS — F17200 Nicotine dependence, unspecified, uncomplicated: Secondary | ICD-10-CM | POA: Diagnosis not present

## 2018-03-15 DIAGNOSIS — J189 Pneumonia, unspecified organism: Secondary | ICD-10-CM | POA: Insufficient documentation

## 2018-03-15 DIAGNOSIS — Z23 Encounter for immunization: Secondary | ICD-10-CM

## 2018-03-15 MED ORDER — TRIAZOLAM 0.25 MG PO TABS: ORAL_TABLET | ORAL | 0 refills | Status: DC

## 2018-03-15 NOTE — Patient Instructions (Signed)

## 2018-03-15 NOTE — Progress Notes (Signed)
Pre-procedure triazolam ordered for pre-operative anxiety.  

## 2018-03-15 NOTE — Progress Notes (Signed)
Subjective:  Patient ID: [name], male    DOB: 1971-05-06  Age: 47 y.o. MRN: [id number]  CC: PNA f/u   HPI [name] a 47 y.o.malewith a medical history of DM2, HIV, HTN, Tobacco use disorder, Testicular mass, perirectal abscess, DDD, LBP pain w/sciatica, depression, anxiety, and Transgenderism presents to f/u on PNA. She has finished his course of Augmentin and is feeling "1000 times better". States she has absolutely no symptoms or complaints. Continues to smoke approximately 10 cigarettes per day. Prescribed Chantix but decided not to fill. Says she has the willpower to stop smoking on her own.     Outpatient Medications Prior to Visit  Medication Sig Dispense Refill  . ACCU-CHEK AVIVA PLUS test strip 1 each by Other route 3 (three) times daily. 100 each 5  . ACCU-CHEK SOFTCLIX LANCETS lancets 1 each by Other route 3 (three) times daily. 100 each 5  . albuterol (PROVENTIL HFA;VENTOLIN HFA) 108 (90 Base) MCG/ACT inhaler Inhale 2 puffs into the lungs every 6 (six) hours as needed for wheezing or shortness of breath. 1 Inhaler 6  . aspirin 81 MG tablet Take 1 tablet (81 mg total) by mouth daily. 30 tablet   . bictegravir-emtricitabine-tenofovir AF (BIKTARVY) 50-200-25 MG TABS tablet Take 1 tablet by mouth daily. 30 tablet 11  . blood glucose meter kit and supplies Dispense based on patient and insurance preference. Use up to four times daily as directed. (FOR ICD-10 E10.9, E11.9). 1 each 0  . diclofenac (VOLTAREN) 50 MG EC tablet Take 1 tablet (50 mg total) by mouth 3 (three) times daily. 90 tablet 3  . escitalopram (LEXAPRO) 20 MG tablet TAKE 1 TABLET BY MOUTH AT BEDTIME 30 tablet 5  . glimepiride (AMARYL) 4 MG tablet Take 1 tablet (4 mg total) by mouth daily before breakfast. 30 tablet 5  . hydrOXYzine (ATARAX/VISTARIL) 10 MG tablet TAKE 1 TABLET (10 MG TOTAL) BY MOUTH 2 (TWO) TIMES DAILY. 60 tablet 1  . metFORMIN (GLUCOPHAGE) 500 MG tablet Take 2 tablets (1,000 mg total)  by mouth 2 (two) times daily with a meal. Take ONE 500 BID x 2 weeks then 2 tabs BID 120 tablet 11  . spironolactone (ALDACTONE) 50 MG tablet TAKE 1 TABLET BY MOUTH ONCE DAILY 30 tablet 5  . traMADol (ULTRAM) 50 MG tablet Take 1 tablet (50 mg total) by mouth every 6 (six) hours as needed. 50 tablet 0  . gabapentin (NEURONTIN) 300 MG capsule Take 1 capsule (300 mg total) by mouth at bedtime for 4 days, THEN 1 capsule (300 mg total) 2 (two) times daily for 4 days, THEN 1 capsule (300 mg total) 3 (three) times daily for 4 days. 90 capsule 3  . varenicline (CHANTIX STARTING MONTH PAK) 0.5 MG X 11 & 1 MG X 42 tablet Take one 0.5 mg tablet by mouth once daily for 3 days, then increase to one 0.5 mg tablet twice daily for 4 days, then increase to one 1 mg tablet twice daily. (Patient not taking: Reported on 03/15/2018) 53 tablet 0  . benzonatate (TESSALON) 100 MG capsule Take 1 capsule (100 mg total) by mouth 2 (two) times daily as needed for cough. 20 capsule 0   No facility-administered medications prior to visit.      ROS Review of Systems  Constitutional: Negative for chills, fever and malaise/fatigue.  Eyes: Negative for blurred vision.  Respiratory: Negative for shortness of breath.   Cardiovascular: Negative for chest pain and palpitations.  Gastrointestinal:  Negative for abdominal pain and nausea.  Genitourinary: Negative for dysuria and hematuria.  Musculoskeletal: Negative for joint pain and myalgias.  Skin: Negative for rash.  Neurological: Negative for tingling and headaches.  Psychiatric/Behavioral: Negative for depression. The patient is not nervous/anxious.     Objective:  BP 118/84 (BP Location: Left Arm, Patient Position: Sitting, Cuff Size: Large)   Pulse 96   Temp 98 F (36.7 C) (Oral)   Ht '5\' 11"'  (1.803 m)   Wt 240 lb 12.8 oz (109.2 kg)   SpO2 94%   BMI 33.58 kg/m   BP/Weight 03/15/2018 03/01/2018 8/72/1587  Systolic BP 276 184 859  Diastolic BP 84 82 85  Wt. (Lbs)  240.8 239 238  BMI 33.58 33.33 33.19      Physical Exam  Constitutional: He is oriented to person, place, and time.  Well developed, well nourished, NAD, polite  HENT:  Head: Normocephalic and atraumatic.  Eyes: No scleral icterus.  Neck: Normal range of motion. Neck supple. No thyromegaly present.  Cardiovascular: Normal rate, regular rhythm and normal heart sounds.  Pulmonary/Chest: Effort normal. No respiratory distress. He has wheezes (mild wheezes in the left upper lung fields.).  Musculoskeletal: He exhibits no edema.  Neurological: He is alert and oriented to person, place, and time.  Skin: Skin is warm and dry. No rash noted. No erythema. No pallor.  Psychiatric: He has a normal mood and affect. His behavior is normal. Thought content normal.  Vitals reviewed.    Assessment & Plan:    1. Pneumonia of left lower lobe due to infectious organism - DG Chest 2 View; Future. Test for resolution of opacity that is likely pneumonia. Opacity could possibly be malignancy. Will await result.  2. Tobacco use disorder - Pt has declined pharmacotherapy. Will try to quit smoking on his own. I have asked her to call in if she changes her mind.   3. Need for Tdap vaccination - Tdap vaccine greater than or equal to 7yo IM   Follow-up: Return in about 3 months (around 06/15/2018), or if symptoms worsen or fail to improve, for diabetes (if not already scheduled for december).   Clent Demark PA

## 2018-03-16 ENCOUNTER — Telehealth (INDEPENDENT_AMBULATORY_CARE_PROVIDER_SITE_OTHER): Payer: Self-pay | Admitting: Physical Medicine and Rehabilitation

## 2018-03-16 ENCOUNTER — Other Ambulatory Visit (INDEPENDENT_AMBULATORY_CARE_PROVIDER_SITE_OTHER): Payer: Self-pay | Admitting: Physical Medicine and Rehabilitation

## 2018-03-16 MED ORDER — DIAZEPAM 5 MG PO TABS: ORAL_TABLET | ORAL | 0 refills | Status: DC

## 2018-03-16 MED FILL — diazePAM 5 MG TABS: 5 | 1 days supply | Qty: 2 | Fill #0

## 2018-03-16 NOTE — Telephone Encounter (Signed)
Valium rx sent in

## 2018-03-19 ENCOUNTER — Ambulatory Visit (INDEPENDENT_AMBULATORY_CARE_PROVIDER_SITE_OTHER): Payer: Self-pay

## 2018-03-19 ENCOUNTER — Ambulatory Visit (INDEPENDENT_AMBULATORY_CARE_PROVIDER_SITE_OTHER): Payer: Medicaid Other | Admitting: Physical Medicine and Rehabilitation

## 2018-03-19 VITALS — BP 137/89 | HR 98 | Temp 98.3°F

## 2018-03-19 DIAGNOSIS — M5416 Radiculopathy, lumbar region: Secondary | ICD-10-CM | POA: Diagnosis not present

## 2018-03-19 MED ORDER — BETAMETHASONE SOD PHOS & ACET 6 (3-3) MG/ML IJ SUSP
12.0000 mg | Freq: Once | INTRAMUSCULAR | Status: AC
  Administered 2018-03-19: 12 mg

## 2018-03-19 NOTE — Patient Instructions (Signed)

## 2018-03-19 NOTE — Progress Notes (Signed)
 .  Numeric Pain Rating Scale and Functional Assessment Average Pain 10   In the last MONTH (on 0-10 scale) has pain interfered with the following?  1. General activity like being  able to carry out your everyday physical activities such as walking, climbing stairs, carrying groceries, or moving a chair?  Rating(10)   +Driver(uber) -BT, -Dye Allergies.

## 2018-03-21 ENCOUNTER — Encounter: Payer: Self-pay | Admitting: *Deleted

## 2018-03-30 NOTE — Procedures (Signed)
Lumbosacral Transforaminal Epidural Steroid Injection - Sub-Pedicular Approach with Fluoroscopic Guidance  Patient: [name]      Date of Birth: February 26, 1971 MRN: [id number] PCP: [name], PA-C      Visit Date: 03/19/2018   Universal Protocol:    Date/Time: 03/19/2018  Consent Given By: the patient  Position: PRONE  Additional Comments: Vital signs were monitored before and after the procedure. Patient was prepped and draped in the usual sterile fashion. The correct patient, procedure, and site was verified.   Injection Procedure Details:  Procedure Site One Meds Administered:  Meds ordered this encounter  Medications  . betamethasone acetate-betamethasone sodium phosphate (CELESTONE) injection 12 mg    Laterality: Right  Location/Site:  L5-S1  Needle size: 22 G  Needle type: Spinal  Needle Placement: Transforaminal  Findings:    -Comments: Excellent flow of contrast along the nerve and into the epidural space.  Procedure Details: After squaring off the end-plates to get a true AP view, the C-arm was positioned so that an oblique view of the foramen as noted above was visualized. The target area is just inferior to the "nose of the scotty dog" or sub pedicular. The soft tissues overlying this structure were infiltrated with 2-3 ml. of 1% Lidocaine without Epinephrine.  The spinal needle was inserted toward the target using a "trajectory" view along the fluoroscope beam.  Under AP and lateral visualization, the needle was advanced so it did not puncture dura and was located close the 6 O'Clock position of the pedical in AP tracterory. Biplanar projections were used to confirm position. Aspiration was confirmed to be negative for CSF and/or blood. A 1-2 ml. volume of Isovue-250 was injected and flow of contrast was noted at each level. Radiographs were obtained for documentation purposes.   After attaining the desired flow of contrast documented above, a  0.5 to 1.0 ml test dose of 0.25% Marcaine was injected into each respective transforaminal space.  The patient was observed for 90 seconds post injection.  After no sensory deficits were reported, and normal lower extremity motor function was noted,   the above injectate was administered so that equal amounts of the injectate were placed at each foramen (level) into the transforaminal epidural space.   Additional Comments:  The patient tolerated the procedure well Dressing: Band-Aid    Post-procedure details: Patient was observed during the procedure. Post-procedure instructions were reviewed.  Patient left the clinic in stable condition.

## 2018-03-30 NOTE — Progress Notes (Signed)
[name] - 47 y.o. male MRN [id number]  Date of birth: 1970-08-18  Office Visit Note: Visit Date: 03/19/2018 PCP: [name], PA-C Referred by: [name], PA-C  Subjective: Chief Complaint  Patient presents with  . Lower Back - Pain  . Right Leg - Pain, Numbness  . Right Foot - Tingling   HPI:  [name] is a 47 y.o. male who comes in today At the request of Dr. Vira Browns for right L5 transforaminal epidural steroid injection.  Patient is having right leg pain with numbness into the right leg particularly in the right foot and the fourth and fifth digit.  This is a combination of L5 and S1 type symptoms.  There is a spondylolysis and pars defect at L5-S1.  ROS Otherwise per HPI.  Assessment & Plan: Visit Diagnoses:  1. Lumbar radiculopathy     Plan: No additional findings.   Meds & Orders:  Meds ordered this encounter  Medications  . betamethasone acetate-betamethasone sodium phosphate (CELESTONE) injection 12 mg    Orders Placed This Encounter  Procedures  . XR C-ARM NO REPORT  . Epidural Steroid injection    Follow-up: Return for Vira Browns, MD.   Procedures: No procedures performed  Lumbosacral Transforaminal Epidural Steroid Injection - Sub-Pedicular Approach with Fluoroscopic Guidance  Patient: [name]      Date of Birth: 1970/07/03 MRN: [id number] PCP: [name], PA-C      Visit Date: 03/19/2018   Universal Protocol:    Date/Time: 03/19/2018  Consent Given By: the patient  Position: PRONE  Additional Comments: Vital signs were monitored before and after the procedure. Patient was prepped and draped in the usual sterile fashion. The correct patient, procedure, and site was verified.   Injection Procedure Details:  Procedure Site One Meds Administered:  Meds ordered this encounter  Medications  . betamethasone acetate-betamethasone sodium phosphate (CELESTONE) injection 12 mg    Laterality:  Right  Location/Site:  L5-S1  Needle size: 22 G  Needle type: Spinal  Needle Placement: Transforaminal  Findings:    -Comments: Excellent flow of contrast along the nerve and into the epidural space.  Procedure Details: After squaring off the end-plates to get a true AP view, the C-arm was positioned so that an oblique view of the foramen as noted above was visualized. The target area is just inferior to the "nose of the scotty dog" or sub pedicular. The soft tissues overlying this structure were infiltrated with 2-3 ml. of 1% Lidocaine without Epinephrine.  The spinal needle was inserted toward the target using a "trajectory" view along the fluoroscope beam.  Under AP and lateral visualization, the needle was advanced so it did not puncture dura and was located close the 6 O'Clock position of the pedical in AP tracterory. Biplanar projections were used to confirm position. Aspiration was confirmed to be negative for CSF and/or blood. A 1-2 ml. volume of Isovue-250 was injected and flow of contrast was noted at each level. Radiographs were obtained for documentation purposes.   After attaining the desired flow of contrast documented above, a 0.5 to 1.0 ml test dose of 0.25% Marcaine was injected into each respective transforaminal space.  The patient was observed for 90 seconds post injection.  After no sensory deficits were reported, and normal lower extremity motor function was noted,   the above injectate was administered so that equal amounts of the injectate were placed at each foramen (level) into the transforaminal epidural  space.   Additional Comments:  The patient tolerated the procedure well Dressing: Band-Aid    Post-procedure details: Patient was observed during the procedure. Post-procedure instructions were reviewed.  Patient left the clinic in stable condition.     Clinical History: MRI LUMBAR SPINE WITHOUT AND WITH CONTRAST  Technique:  Multiplanar and  multiecho pulse sequences of the lumbar spine were obtained without and with intravenous contrast.  Contrast: 20 ml MultiHance.  Comparison: No comparison MR.  02/11/2008 CT.  Findings: Last fully open disc space is labeled L5-S1.  Present examination incorporates from T10-11 disc space through the mid sacrum.  Conus T12-L1.  Left kidney not visualized.  Small cyst right kidney.  Slight decreased signal intensity bone marrow.  Correlation with CBC and differential recommend to exclude anemia or infiltrative process contributing to this appearance.  T10-11 through L2-3 without significant abnormality.  L3-4:  Mild disc degeneration with small broad-based disc protrusion having greater extension left posterior lateral position with slight indentation upon the left aspect of the thecal sac.  L4-5:  Mild facet joint degenerative changes.  Small to slightly moderate sized broad-based central slightly caudally extending disc protrusion minimally more notable to the left.  Minimal indentation upon the thecal sac.  Very mild bilateral foraminal narrowing.  L5-S1:  Bilateral L5 pars defects.  Anterior slip of L5 by 1.4 cm. Disc degeneration with adjacent endplate reactive changes.  Broad- based disc protrusion with cephalad extension and extension into the neural foramen with marked bilateral foraminal narrowing and compression of the exiting L5 nerve roots bilaterally.  Very mild spinal stenosis.  IMPRESSION: L5-S1 bilateral L5 pars defects.  Anterior slip of L5 by 1.4 cm. Disc degeneration with adjacent endplate reactive changes.  Broad- based disc protrusion with cephalad extension and extension into the neural foramen with marked bilateral foraminal narrowing and compression of the exiting L5 nerve roots bilaterally.  L4-5 small to slightly moderate sized broad-based central slightly caudally extending disc protrusion minimally more notable to the left.  Minimal  indentation upon the thecal sac.  Very mild bilateral foraminal narrowing.  L3-4 small broad-based disc protrusion having greater extension left posterior lateral position with slight indentation upon the left aspect of the thecal sac.  Slight decreased signal intensity bone marrow.  Correlation with CBC and differential recommend to exclude anemia or infiltrative process contributing to this appearance.  Original Report Authenticated By: Fuller Canada, M.D.     Objective:  VS:  HT:    WT:   BMI:     BP:137/89  HR:98bpm  TEMP:98.3 F (36.8 C)(Oral)  RESP:  Physical Exam  Ortho Exam Imaging: No results found.

## 2018-04-03 ENCOUNTER — Other Ambulatory Visit: Payer: Self-pay | Admitting: Infectious Disease

## 2018-04-03 DIAGNOSIS — E1165 Type 2 diabetes mellitus with hyperglycemia: Secondary | ICD-10-CM

## 2018-04-10 ENCOUNTER — Ambulatory Visit: Payer: Medicaid Other | Admitting: Infectious Disease

## 2018-04-13 MED FILL — metFORMIN HCL 500 MG TABS: 500 | 30 days supply | Qty: 120 | Fill #0

## 2018-04-13 MED FILL — BIKTARVY 50-200-25 MG TABS: 50-200-25 | 30 days supply | Qty: 30 | Fill #0

## 2018-04-13 MED FILL — ESCITALOPRAM 20 MG TABLET: 20 | 30 days supply | Qty: 30 | Fill #1

## 2018-04-13 MED FILL — SPIRONOLACTONE 50 MG TABS: 50 | 30 days supply | Qty: 30 | Fill #1

## 2018-04-16 ENCOUNTER — Telehealth (INDEPENDENT_AMBULATORY_CARE_PROVIDER_SITE_OTHER): Payer: Self-pay | Admitting: Specialist

## 2018-04-16 NOTE — Telephone Encounter (Signed)
Pending signature from Dr. Nitka  

## 2018-04-16 NOTE — Telephone Encounter (Signed)
Patient is requesting a handicap placard.  CB#[phone].  Thank you/

## 2018-04-19 ENCOUNTER — Ambulatory Visit (INDEPENDENT_AMBULATORY_CARE_PROVIDER_SITE_OTHER): Payer: Medicaid Other | Admitting: Surgery

## 2018-04-19 ENCOUNTER — Encounter (INDEPENDENT_AMBULATORY_CARE_PROVIDER_SITE_OTHER): Payer: Self-pay | Admitting: Surgery

## 2018-04-19 DIAGNOSIS — M4317 Spondylolisthesis, lumbosacral region: Secondary | ICD-10-CM | POA: Diagnosis not present

## 2018-04-19 NOTE — Telephone Encounter (Signed)
I called and lmom that the Handicap placard is ready at the front desk

## 2018-04-19 NOTE — Progress Notes (Signed)
Office Visit Note   Patient: [name]           Date of Birth: 1970/10/14           MRN: [id number] Visit Date: 04/19/2018              Requested by: [name], PA-C [address] [place], [place] [address] PCP: [name], PA-C   Assessment & Plan: Visit Diagnoses:  1. Spondylolisthesis of lumbosacral region     Plan: With patient's ongoing symptoms and failed conservative treatment we will get lumbar MRI to rule out HNP/stenosis and to better evaluate her L5-S1 spondylolisthesis.  Follow with Dr. Otelia Sergeant after completion of study to discuss results and further treatment options.  Patient asked for stronger pain medication and I advised her to use with Dr. Otelia Sergeant has provided which is Ultram.  Follow-Up Instructions: Return in about 3 weeks (around 05/10/2018) for With Dr. Otelia Sergeant to review lumbar MRI.   Orders:  Orders Placed This Encounter  Procedures  . MR Lumbar Spine w/o contrast   No orders of the defined types were placed in this encounter.     Procedures: No procedures performed   Clinical Data: No additional findings.   Subjective: Chief Complaint  Patient presents with  . Lower Back - Pain, Follow-up    HPI 47 year old patient returns for recheck of chronic low back pain and right lower extremity radiculopathy.  Had lumbar ESI with Dr. Alvester Morin and this gave about 5 days improvement of symptoms.  Continues have ongoing right lower extremity radiculopathy with feeling of leg giving away.  Has done the exercises that have been provided without any improvement. Review of Systems Patient admits to a cough.  History of smoking.  No cardiac GI GU issues  Objective: Vital Signs: Ht 5\' 11"  (1.803 m)   Wt 240 lb 12.8 oz (109.2 kg)   BMI 33.58 kg/m   Physical Exam  Constitutional: She is oriented to person, place, and time. No distress.  HENT:  Head: Normocephalic and atraumatic.  Eyes: Pupils are equal, round, and reactive to light.    Pulmonary/Chest: No respiratory distress.  Musculoskeletal:  Negative straight leg raise.  Patient does have trace right anterior tib and gastroc weakness.  Calf nontender.  Neurological: She is alert and oriented to person, place, and time.    Ortho Exam  Specialty Comments:  No specialty comments available.  Imaging: No results found.   PMFS History: Patient Active Problem List   Diagnosis Date Noted  . Pneumonia due to infectious organism 03/15/2018  . Ischemic stroke (HCC) 10/31/2017  . Visual field defect   . Complicated migraine   . Migraine with visual aura   . Perirectal abscess 03/30/2017  . Essential hypertension 06/01/2016  . Diabetes mellitus type 2 in obese (HCC) 06/01/2016  . Poorly controlled diabetes mellitus (HCC) 04/27/2015  . Transgender 04/27/2015  . Hearing loss secondary to cerumen impaction 02/07/2013  . Smoker 03/07/2011  . Asthma 02/07/2008  . GENDER IDENTITY DISORDER 10/31/2007  . Depression with anxiety 10/17/2007  . Human immunodeficiency virus (HIV) disease (HCC) 04/14/2006  . HIDRADENITIS SUPPURATIVA 04/14/2006   Past Medical History:  Diagnosis Date  . Diabetes mellitus   . HIV disease (HCC)   . Hypertension   . MRSA carrier   . Perirectal abscess 03/30/2017  . Polyuria 04/27/2015  . Poorly controlled diabetes mellitus (HCC) 04/27/2015  . Testicular mass 06/20/2016  . Transgender 04/27/2015    No family  history on file.  Past Surgical History:  Procedure Laterality Date  . APPENDECTOMY     Social History   Occupational History  . Not on file  Tobacco Use  . Smoking status: Current Every Day Smoker    Packs/day: 1.00    Years: 27.00    Pack years: 27.00    Types: Cigarettes  . Smokeless tobacco: Never Used  Substance and Sexual Activity  . Alcohol use: No    Alcohol/week: 0.0 standard drinks  . Drug use: Yes    Types: Marijuana    Comment: some daily  . Sexual activity: Yes    Partners: Male    Comment: pt. given  condoms

## 2018-04-20 ENCOUNTER — Other Ambulatory Visit (INDEPENDENT_AMBULATORY_CARE_PROVIDER_SITE_OTHER): Payer: Self-pay | Admitting: Surgery

## 2018-04-23 ENCOUNTER — Ambulatory Visit: Payer: Medicaid Other | Admitting: Infectious Disease

## 2018-04-24 ENCOUNTER — Telehealth (INDEPENDENT_AMBULATORY_CARE_PROVIDER_SITE_OTHER): Payer: Self-pay | Admitting: Specialist

## 2018-04-24 NOTE — Telephone Encounter (Signed)
I have put [name] on the cancellation list, but go ahead and shed for next available appt so that there is an appt on the books for the review. If something opens sooner I will call them.

## 2018-04-24 NOTE — Telephone Encounter (Signed)
This patient has a MRI set for 11/17. Needs to come in to go over MRI. No openings until week of Christmas.  Please put on waiting list or let me know a day I can schedule this.

## 2018-04-29 ENCOUNTER — Ambulatory Visit
Admission: RE | Admit: 2018-04-29 | Discharge: 2018-04-29 | Disposition: A | Discharge status: Home / Self Care | Payer: Medicaid Other | Source: Ambulatory Visit | Attending: Surgery | Admitting: Surgery

## 2018-04-29 DIAGNOSIS — M4317 Spondylolisthesis, lumbosacral region: Secondary | ICD-10-CM

## 2018-05-03 ENCOUNTER — Encounter (INDEPENDENT_AMBULATORY_CARE_PROVIDER_SITE_OTHER): Payer: Self-pay | Admitting: Specialist

## 2018-05-03 ENCOUNTER — Ambulatory Visit (INDEPENDENT_AMBULATORY_CARE_PROVIDER_SITE_OTHER): Payer: Medicaid Other | Admitting: Specialist

## 2018-05-03 VITALS — BP 135/92 | HR 97 | Ht 71.0 in | Wt 240.0 lb

## 2018-05-03 DIAGNOSIS — M4316 Spondylolisthesis, lumbar region: Secondary | ICD-10-CM

## 2018-05-03 DIAGNOSIS — M5136 Other intervertebral disc degeneration, lumbar region: Secondary | ICD-10-CM | POA: Diagnosis not present

## 2018-05-03 DIAGNOSIS — M5116 Intervertebral disc disorders with radiculopathy, lumbar region: Secondary | ICD-10-CM

## 2018-05-03 MED ORDER — HYDROCODONE-ACETAMINOPHEN 7.5-325 MG PO TABS: 1.0000 | ORAL_TABLET | Freq: Four times a day (QID) | ORAL | 0 refills | Status: DC | PRN

## 2018-05-03 MED FILL — HYDROCODON-APAP 7.5-325: 7.5-325 | 6 days supply | Qty: 50 | Fill #0

## 2018-05-03 NOTE — Patient Instructions (Signed)
Avoid bending, stooping and avoid lifting weights greater than 10 lbs. Avoid prolong standing and walking. Order for a new walker with wheels. Surgery scheduling secretary Tivis RingerSherri Billings, will call you in the next week to schedule for surgery.  Surgery recommended is a three level lumbar fusion L3-4, L4-5 and L5-S1this would be done with rods, pedicle screws, iliac wing screws and cages with local bone graft and allograft (donor bone graft). Take hydrocodone for for pain. Risk of surgery includes risk of infection 1 in 100 patients, bleeding 5% chance you would need a transfusion.   Risk to the nerves is one in 10,000. You will need to use a brace for 3 months and wean from the brace on the 4th month. Expect improved walking and standing tolerance. Expect relief of leg pain but numbness may persist depending on the length and degree of pressure that has been present.

## 2018-05-03 NOTE — Progress Notes (Signed)
Office Visit Note   Patient: [name] OleaSteven A Storer           Date of Birth: 1970/12/14           MRN: [id number] Visit Date: 05/03/2018              Requested by: Loletta SpecterGomez, Roger David, PA-C [id number] [place], [place] [id number] PCP: Loletta SpecterGomez, Roger David, PA-C   Assessment & Plan: Visit Diagnoses:  1. Lumbar disc herniation with radiculopathy   2. Degenerative disc disease, lumbar   3. Spondylolisthesis of lumbar region     Plan: Avoid bending, stooping and avoid lifting weights greater than 10 lbs. Avoid prolong standing and walking. Order for a new walker with wheels. Surgery scheduling secretary Tivis RingerSherri Billings, will call you in the next week to schedule for surgery.  Surgery recommended is a three level lumbar fusion L3-4, L4-5 and L5-S1this would be done with rods, pedicle screws, iliac wing screws and cages with local bone graft and allograft (donor bone graft). Take hydrocodone for for pain. Risk of surgery includes risk of infection 1 in 100 patients, bleeding 5% chance you would need a transfusion.   Risk to the nerves is one in 10,000. You will need to use a brace for 3 months and wean from the brace on the 4th month. Expect improved walking and standing tolerance. Expect relief of leg pain but numbness may persist depending on the length and degree of pressure that has been present.   Follow-Up Instructions: No follow-ups on file.   Orders:  No orders of the defined types were placed in this encounter.  No orders of the defined types were placed in this encounter.     Procedures: No procedures performed   Clinical Data: Findings:  CLINICAL DATA:  Progressively worsening chronic low back pain radiating into the right leg.  EXAM: MRI LUMBAR SPINE WITHOUT CONTRAST  TECHNIQUE: Multiplanar, multisequence MR imaging of the lumbar spine was performed. No intravenous contrast was administered.  COMPARISON:  Lumbar spine x-rays dated February 23, 2018.  MRI lumbar spine dated July 23, 2010.  FINDINGS: Segmentation:  Standard.  Alignment:  Unchanged 14 mm anterolisthesis at L5-S1.  Vertebrae: No fracture, evidence of discitis, or bone lesion. Progressive a chronic degenerative fatty endplate marrow changes at L5-S1.  Conus medullaris and cauda equina: Conus extends to the T12-L1 level. Conus and cauda equina appear normal.  Paraspinal and other soft tissues: Unchanged solitary right kidney.  Disc levels:  T12-L1:  Negative.  L1-L2:  Negative.  L2-L3:  Negative.  L3-L4: Mild disc bulging with superimposed central disc extrusion migrating inferiorly. New annular fissure. Unchanged mild bilateral neuroforaminal stenosis. No spinal canal stenosis.  L4-L5: Mild bilateral facet arthropathy. New large right paracentral disc extrusion migrating inferiorly. Extruded disc material obliterates the right lateral recess with impingement of the descending right L5 nerve root. Unchanged mild bilateral neuroforaminal stenosis.  L5-S1: Unchanged bilateral L5 pars defects. Unchanged disc uncovering and large broad-based disc protrusion extending into both neural foramina. Unchanged severe bilateral neuroforaminal stenosis. No spinal canal stenosis.  IMPRESSION: 1. New large right paracentral disc extrusion at L4-L5 obliterating the right lateral recess with impingement of the descending right L5 nerve root. 2. Unchanged bilateral L5 pars defects with 14 mm anterolisthesis at L5-S1 and severe bilateral neuroforaminal stenosis impinging on the bilateral exiting L5 nerve roots. 3. New small central disc extrusion and annular fissure at L3-L4 with unchanged mild bilateral neuroforaminal stenosis.     Subjective: Chief  Complaint  Patient presents with  . Lower Back - Follow-up    47 year old transgender male prefers male "[name]". SHe is having severe pain in the backwith radiation into the right buttock and  right leg and the outer 3 toes of the right foot. Pain with sitting standing and walking. Had ESI by Dr. Alvester Morin 30 days ago and it only temporized the discomfort for a couple of days. Pain is sharp and severe 10 of 10. She has night pain and weakness right leg that is worsening with catching of the right foot with standing and walking. No bowel or bladder difficulty. Had a pneumonia last month, was caught early. Pain is worsening the past one year to where he can not walk more than 100 feet before he has to sit or stoop or leaning. He is leaning on grocery carts. Difficulty with sleep the last 2-3 months.    Review of Systems  Constitutional: Positive for chills, fever and unexpected weight change. Negative for activity change, appetite change, diaphoresis and fatigue.  HENT: Negative.   Eyes: Negative.   Respiratory: Negative for cough, chest tightness, shortness of breath and wheezing.   Gastrointestinal: Negative.   Endocrine: Negative.   Genitourinary: Negative.   Musculoskeletal: Positive for back pain and gait problem. Negative for arthralgias, joint swelling, myalgias, neck pain and neck stiffness.  Skin: Negative for color change, pallor, rash and wound.  Allergic/Immunologic: Negative for environmental allergies, food allergies and immunocompromised state.  Neurological: Positive for weakness and numbness.  Hematological: Negative for adenopathy. Does not bruise/bleed easily.  Psychiatric/Behavioral: Negative for agitation, behavioral problems, confusion, decreased concentration, dysphoric mood, hallucinations, self-injury, sleep disturbance and suicidal ideas. The patient is not nervous/anxious and is not hyperactive.      Objective: Vital Signs: BP (!) 135/92 (BP Location: Left Arm, Patient Position: Sitting)   Pulse 97   Ht 5\' 11"  (1.803 m)   Wt 240 lb (108.9 kg)   BMI 33.47 kg/m   Physical Exam  Constitutional: She is oriented to person, place, and time. She appears  well-developed and well-nourished.  HENT:  Head: Normocephalic and atraumatic.  Eyes: Pupils are equal, round, and reactive to light. EOM are normal.  Neck: Normal range of motion. Neck supple.  Pulmonary/Chest: Effort normal and breath sounds normal.  Abdominal: Soft. Bowel sounds are normal.  Neurological: She is alert and oriented to person, place, and time.  Skin: Skin is warm and dry.  Psychiatric: She has a normal mood and affect. Her behavior is normal. Judgment and thought content normal.    Back Exam   Tenderness  The patient is experiencing tenderness in the lumbar.  Range of Motion  Extension: abnormal  Flexion: abnormal  Lateral bend right: abnormal  Lateral bend left: abnormal  Rotation right: abnormal  Rotation left: abnormal   Muscle Strength  Right Quadriceps:  4/5  Left Quadriceps:  5/5  Right Hamstrings:  4/5  Left Hamstrings:  5/5   Tests  Straight leg raise right: positive Straight leg raise left: negative  Reflexes  Patellar:  1/4 abnormal Achilles: 0/4 Babinski's sign: normal   Other  Toe walk: abnormal Heel walk: abnormal Sensation: normal Erythema: no back redness Scars: absent  Comments:  Right SLR at 60 degrees, Right foot ankle DF 4/5 Right foot plantar flexion 4/5 Right EHL 3/5. Left EHL 4/5 Opposite SLR negative Popliteal compression test is positive.      Specialty Comments:  No specialty comments available.  Imaging: No results found.  PMFS History: Patient Active Problem List   Diagnosis Date Noted  . Pneumonia due to infectious organism 03/15/2018  . Ischemic stroke (HCC) 10/31/2017  . Visual field defect   . Complicated migraine   . Migraine with visual aura   . Perirectal abscess 03/30/2017  . Essential hypertension 06/01/2016  . Diabetes mellitus type 2 in obese (HCC) 06/01/2016  . Poorly controlled diabetes mellitus (HCC) 04/27/2015  . Transgender 04/27/2015  . Hearing loss secondary to cerumen  impaction 02/07/2013  . Smoker 03/07/2011  . Asthma 02/07/2008  . GENDER IDENTITY DISORDER 10/31/2007  . Depression with anxiety 10/17/2007  . Human immunodeficiency virus (HIV) disease (HCC) 04/14/2006  . HIDRADENITIS SUPPURATIVA 04/14/2006   Past Medical History:  Diagnosis Date  . Diabetes mellitus   . HIV disease (HCC)   . Hypertension   . MRSA carrier   . Perirectal abscess 03/30/2017  . Polyuria 04/27/2015  . Poorly controlled diabetes mellitus (HCC) 04/27/2015  . Testicular mass 06/20/2016  . Transgender 04/27/2015    History reviewed. No pertinent family history.  Past Surgical History:  Procedure Laterality Date  . APPENDECTOMY     Social History   Occupational History  . Not on file  Tobacco Use  . Smoking status: Current Every Day Smoker    Packs/day: 1.00    Years: 27.00    Pack years: 27.00    Types: Cigarettes  . Smokeless tobacco: Never Used  Substance and Sexual Activity  . Alcohol use: No    Alcohol/week: 0.0 standard drinks  . Drug use: Yes    Types: Marijuana    Comment: some daily  . Sexual activity: Yes    Partners: Male    Comment: pt. given condoms

## 2018-05-04 ENCOUNTER — Other Ambulatory Visit: Payer: Self-pay | Admitting: Infectious Disease

## 2018-05-04 NOTE — Telephone Encounter (Signed)
Noted  

## 2018-05-04 NOTE — Telephone Encounter (Signed)
Patient schedule an appointment for pre-op clearance on dec 11 @ 1:50pm

## 2018-05-07 ENCOUNTER — Other Ambulatory Visit (INDEPENDENT_AMBULATORY_CARE_PROVIDER_SITE_OTHER): Payer: Self-pay | Admitting: Physician Assistant

## 2018-05-07 MED ORDER — METFORMIN HCL 1000 MG PO TABS: 1000.0000 mg | ORAL_TABLET | Freq: Two times a day (BID) | ORAL | 1 refills | Status: DC

## 2018-05-14 ENCOUNTER — Telehealth: Payer: Self-pay

## 2018-05-14 ENCOUNTER — Other Ambulatory Visit: Payer: Self-pay | Admitting: Infectious Disease

## 2018-05-14 MED FILL — ESCITALOPRAM 20 MG TABLET: 20 | 30 days supply | Qty: 30 | Fill #2

## 2018-05-14 MED FILL — metFORMIN HCL 1000 MG TABS: 1000 | 90 days supply | Qty: 180 | Fill #0

## 2018-05-14 MED FILL — SPIRONOLACTONE 50 MG TABS: 50 | 30 days supply | Qty: 30 | Fill #2

## 2018-05-14 NOTE — Telephone Encounter (Signed)
Called patient [name] Patient last saw Dr. Daiva EvesVan Dam 03/30/17 was supposed to do a 6 week follow-up. Has no showed multiple appointments. Left voicemail asking patient to call office to set up an appointment with Dr. Daiva EvesVan Dam. Lorenso CourierJose L Paublo Warshawsky, CMA

## 2018-05-23 ENCOUNTER — Ambulatory Visit (INDEPENDENT_AMBULATORY_CARE_PROVIDER_SITE_OTHER): Payer: Medicaid Other | Admitting: Physician Assistant

## 2018-05-23 ENCOUNTER — Encounter (INDEPENDENT_AMBULATORY_CARE_PROVIDER_SITE_OTHER): Payer: Self-pay | Admitting: Physician Assistant

## 2018-05-23 ENCOUNTER — Other Ambulatory Visit: Payer: Self-pay

## 2018-05-23 ENCOUNTER — Ambulatory Visit (HOSPITAL_COMMUNITY)
Admission: RE | Admit: 2018-05-23 | Discharge: 2018-05-23 | Disposition: A | Discharge status: Home / Self Care | Payer: Medicaid Other | Source: Ambulatory Visit | Attending: Physician Assistant | Admitting: Physician Assistant

## 2018-05-23 VITALS — BP 127/87 | HR 95 | Temp 97.9°F | Ht 71.0 in | Wt 240.6 lb

## 2018-05-23 DIAGNOSIS — Z01818 Encounter for other preprocedural examination: Secondary | ICD-10-CM | POA: Diagnosis not present

## 2018-05-23 DIAGNOSIS — F17200 Nicotine dependence, unspecified, uncomplicated: Secondary | ICD-10-CM | POA: Diagnosis not present

## 2018-05-23 DIAGNOSIS — E119 Type 2 diabetes mellitus without complications: Secondary | ICD-10-CM | POA: Diagnosis not present

## 2018-05-23 DIAGNOSIS — M5386 Other specified dorsopathies, lumbar region: Secondary | ICD-10-CM

## 2018-05-23 DIAGNOSIS — Z794 Long term (current) use of insulin: Secondary | ICD-10-CM

## 2018-05-23 LAB — POCT GLYCOSYLATED HEMOGLOBIN (HGB A1C): Hemoglobin A1C: 9.3 % — AB (ref 4.0–5.6)

## 2018-05-23 MED ORDER — INSULIN GLARGINE 100 UNIT/ML SOLOSTAR PEN: 25.0000 [IU] | PEN_INJECTOR | Freq: Every day | SUBCUTANEOUS | 2 refills | Status: DC

## 2018-05-23 MED ORDER — PEN NEEDLES 31G X 8 MM MISC: 1.0000 | Freq: Every day | 1 refills | Status: DC

## 2018-05-23 MED FILL — UNIFINE PENTIPS 8MM 31G: 31G X 8 MM | 100 days supply | Qty: 100 | Fill #0

## 2018-05-23 MED FILL — LANTUS SOLOSTAR 100 UNITS/M: 100 | 24 days supply | Qty: 6 | Fill #0

## 2018-05-23 NOTE — Progress Notes (Signed)
Subjective:  Patient ID: [name], adult    DOB: [birth year]  Age: 47 y.o. MRN: [id number]  CC: pre-op exam  HPI  [name] a 47 y.o.malewith a medical history of Transgenderism, DM2, HIV, HTN, Tobacco use disorder, Testicular mass, Perirectal abscess, DDD, LBP pain w/sciatica, depression, and anxiety presents for pre-operative examination. His orthopedic specialist has recommended a three level lumbar fusion L3-4, L4-5, and L5-S1. Pt states he is feeling "great" except for his excruciating back pain. Pt continues to smoke cigarettes but has decreased from a pack a day to five cigarettes per day over a six month time frame. Has decreased smoking through will power and the desire to be healthy and qualify for spinal surgery. Pt has also decreased amount of sweets in an effort to improve blood sugar. Says he is drinking an occasional diet Dr. Malachi Bonds. Does not endorse CP, palpitations, SOB, HA, tingling, numbness, abdominal pain, f/c/n/v, rash, swelling, or GI/GU sxs.     Outpatient Medications Prior to Visit  Medication Sig Dispense Refill  . ACCU-CHEK AVIVA PLUS test strip 1 each by Other route 3 (three) times daily. 100 each 5  . ACCU-CHEK SOFTCLIX LANCETS lancets 1 each by Other route 3 (three) times daily. 100 each 5  . albuterol (PROVENTIL HFA;VENTOLIN HFA) 108 (90 Base) MCG/ACT inhaler Inhale 2 puffs into the lungs every 6 (six) hours as needed for wheezing or shortness of breath. 1 Inhaler 6  . aspirin 81 MG tablet Take 1 tablet (81 mg total) by mouth daily. 30 tablet   . BIKTARVY 50-200-25 MG TABS tablet TAKE 1 TABLET BY MOUTH DAILY. 30 tablet 0  . blood glucose meter kit and supplies Dispense based on patient and insurance preference. Use up to four times daily as directed. (FOR ICD-10 E10.9, E11.9). 1 each 0  . diclofenac (VOLTAREN) 50 MG EC tablet Take 1 tablet (50 mg total) by mouth 3 (three) times daily. 90 tablet 3  . escitalopram (LEXAPRO) 20 MG tablet TAKE 1 TABLET  BY MOUTH AT BEDTIME 30 tablet 5  . gabapentin (NEURONTIN) 300 MG capsule Take 1 capsule (300 mg total) by mouth at bedtime for 4 days, THEN 1 capsule (300 mg total) 2 (two) times daily for 4 days, THEN 1 capsule (300 mg total) 3 (three) times daily for 4 days. 90 capsule 3  . glimepiride (AMARYL) 4 MG tablet Take 1 tablet (4 mg total) by mouth daily before breakfast. 30 tablet 5  . HYDROcodone-acetaminophen (NORCO) 7.5-325 MG tablet Take 1-2 tablets by mouth every 6 (six) hours as needed for moderate pain. 50 tablet 0  . hydrOXYzine (ATARAX/VISTARIL) 10 MG tablet TAKE 1 TABLET (10 MG TOTAL) BY MOUTH 2 (TWO) TIMES DAILY. 60 tablet 1  . metFORMIN (GLUCOPHAGE) 1000 MG tablet Take 1 tablet (1,000 mg total) by mouth 2 (two) times daily with a meal. 180 tablet 1  . spironolactone (ALDACTONE) 50 MG tablet TAKE 1 TABLET BY MOUTH ONCE DAILY 30 tablet 5  . traMADol (ULTRAM) 50 MG tablet Take 1 tablet (50 mg total) by mouth every 6 (six) hours as needed. 50 tablet 0  . varenicline (CHANTIX STARTING MONTH PAK) 0.5 MG X 11 & 1 MG X 42 tablet Take one 0.5 mg tablet by mouth once daily for 3 days, then increase to one 0.5 mg tablet twice daily for 4 days, then increase to one 1 mg tablet twice daily. 53 tablet 0   No facility-administered medications prior to visit.  ROS Review of Systems  Constitutional: Negative for chills, fever and malaise/fatigue.  Eyes: Negative for blurred vision.  Respiratory: Negative for shortness of breath.   Cardiovascular: Negative for chest pain and palpitations.  Gastrointestinal: Negative for abdominal pain and nausea.  Genitourinary: Negative for dysuria and hematuria.  Musculoskeletal: Positive for back pain. Negative for joint pain and myalgias.  Skin: Negative for rash.  Neurological: Negative for tingling and headaches.  Psychiatric/Behavioral: Negative for depression. The patient is not nervous/anxious.     Objective:  Ht '5\' 11"'  (1.803 m)   Wt 240 lb 9.6 oz  (109.1 kg)   BMI 33.56 kg/m    Vitals:   05/23/18 1336  BP: 127/87  Pulse: 95  Temp: 97.9 F (36.6 C)  TempSrc: Oral  SpO2: 96%  Weight: 240 lb 9.6 oz (109.1 kg)  Height: '5\' 11"'  (1.803 m)      Physical Exam  Constitutional: She is oriented to person, place, and time.  Well developed, overweight, in discomfort 2/2 LBP, polite  HENT:  Head: Normocephalic and atraumatic.  Mouth/Throat: Oropharynx is clear and moist.  Eyes: Pupils are equal, round, and reactive to light. Conjunctivae and EOM are normal. No scleral icterus.  Neck: Normal range of motion. Neck supple. No thyromegaly present.  Cardiovascular: Normal rate, regular rhythm and normal heart sounds.  No LE edema bilaterally  Pulmonary/Chest: Effort normal and breath sounds normal.  Abdominal: Soft. Bowel sounds are normal. There is no tenderness.  No hepatosplenomegaly.   Musculoskeletal: She exhibits no edema.  LBP aROM severely limited 2/2 pain  Lymphadenopathy:    She has no cervical adenopathy.  Neurological: She is alert and oriented to person, place, and time.  Skin: Skin is warm and dry. No rash noted. No erythema. No pallor.  Psychiatric: She has a normal mood and affect. Her behavior is normal. Thought content normal.  Vitals reviewed.    Assessment & Plan:    1. Pre-operative clearance - CBC with Differential - Comprehensive metabolic panel - PT AND PTT - DG Chest 2 View; Future - EKG 12-Lead sinus rhythm with low voltage - A1c 9.3%. Pt will not qualify for surgery based on A1c >8%. I have counseled patient, initiated Lantus 25 unit qhs, advised to test blood sugar at home TID, and advised for him to maintain communication of his blood sugar over the next few weeks. I will write medical clearance should his glucometer show his blood sugars have maintained from 80 - 150 at his next /appointment on 06/19/18.  Follow-up: After spinal fusion  Clent Demark PA

## 2018-05-24 LAB — CBC WITH DIFFERENTIAL/PLATELET
BASOS ABS: 0.1 10*3/uL (ref 0.0–0.2)
Basos: 1 %
EOS (ABSOLUTE): 0.3 10*3/uL (ref 0.0–0.4)
Eos: 2 %
Hematocrit: 48.4 % (ref 37.5–51.0)
Hemoglobin: 17.3 g/dL (ref 13.0–17.7)
Immature Grans (Abs): 0 10*3/uL (ref 0.0–0.1)
Immature Granulocytes: 0 %
LYMPHS ABS: 4.6 10*3/uL — AB (ref 0.7–3.1)
Lymphs: 34 %
MCH: 32.6 pg (ref 26.6–33.0)
MCHC: 35.7 g/dL (ref 31.5–35.7)
MCV: 91 fL (ref 79–97)
Monocytes Absolute: 1.3 10*3/uL — ABNORMAL HIGH (ref 0.1–0.9)
Monocytes: 9 %
Neutrophils Absolute: 7.1 10*3/uL — ABNORMAL HIGH (ref 1.4–7.0)
Neutrophils: 54 %
PLATELETS: 336 10*3/uL (ref 150–450)
RBC: 5.31 x10E6/uL (ref 4.14–5.80)
RDW: 12 % — ABNORMAL LOW (ref 12.3–15.4)
WBC: 13.5 10*3/uL — AB (ref 3.4–10.8)

## 2018-05-24 LAB — COMPREHENSIVE METABOLIC PANEL
A/G RATIO: 1.3 (ref 1.2–2.2)
ALK PHOS: 97 IU/L (ref 39–117)
ALT: 29 IU/L (ref 0–44)
AST: 14 IU/L (ref 0–40)
Albumin: 4 g/dL (ref 3.5–5.5)
BILIRUBIN TOTAL: 0.5 mg/dL (ref 0.0–1.2)
BUN/Creatinine Ratio: 23 — ABNORMAL HIGH (ref 9–20)
BUN: 15 mg/dL (ref 6–24)
CALCIUM: 9.6 mg/dL (ref 8.7–10.2)
CO2: 24 mmol/L (ref 20–29)
Chloride: 100 mmol/L (ref 96–106)
Creatinine, Ser: 0.65 mg/dL — ABNORMAL LOW (ref 0.76–1.27)
GFR calc Af Amer: 134 mL/min/{1.73_m2} (ref >59)
GFR calc non Af Amer: 116 mL/min/{1.73_m2} (ref >59)
GLOBULIN, TOTAL: 3 g/dL (ref 1.5–4.5)
GLUCOSE: 273 mg/dL — AB (ref 65–99)
POTASSIUM: 4.3 mmol/L (ref 3.5–5.2)
Sodium: 139 mmol/L (ref 134–144)
TOTAL PROTEIN: 7 g/dL (ref 6.0–8.5)

## 2018-05-24 LAB — PT AND PTT
INR: 1.1 (ref 0.8–1.2)
Prothrombin Time: 11.1 s (ref 9.1–12.0)
aPTT: 30 s (ref 24–33)

## 2018-05-25 ENCOUNTER — Telehealth (INDEPENDENT_AMBULATORY_CARE_PROVIDER_SITE_OTHER): Payer: Self-pay

## 2018-05-25 NOTE — Telephone Encounter (Signed)
Patient is aware that clotting factors are normal. Normal liver and kidneys. Mild chronic bronchitic type changes likely related to smoking. Continue to decrease smoking. [name], CMA

## 2018-06-04 ENCOUNTER — Ambulatory Visit (INDEPENDENT_AMBULATORY_CARE_PROVIDER_SITE_OTHER): Payer: Medicaid Other | Admitting: Specialist

## 2018-06-04 ENCOUNTER — Other Ambulatory Visit: Payer: Self-pay | Admitting: Infectious Disease

## 2018-06-15 ENCOUNTER — Other Ambulatory Visit: Payer: Self-pay | Admitting: Infectious Disease

## 2018-06-15 MED FILL — SPIRONOLACTONE 50 MG TABS: 50 | 30 days supply | Qty: 30 | Fill #3

## 2018-06-15 MED FILL — ESCITALOPRAM 20 MG TABLET: 20 | 30 days supply | Qty: 30 | Fill #3

## 2018-06-18 ENCOUNTER — Other Ambulatory Visit: Payer: Self-pay | Admitting: Infectious Disease

## 2018-06-18 MED FILL — BIKTARVY 50-200-25 MG TABS: 50-200-25 | 30 days supply | Qty: 30 | Fill #0

## 2018-06-19 ENCOUNTER — Ambulatory Visit (INDEPENDENT_AMBULATORY_CARE_PROVIDER_SITE_OTHER): Payer: Medicaid Other | Admitting: Physician Assistant

## 2018-06-19 ENCOUNTER — Other Ambulatory Visit (INDEPENDENT_AMBULATORY_CARE_PROVIDER_SITE_OTHER): Payer: Self-pay | Admitting: Specialist

## 2018-06-19 ENCOUNTER — Encounter (INDEPENDENT_AMBULATORY_CARE_PROVIDER_SITE_OTHER): Payer: Self-pay | Admitting: Physician Assistant

## 2018-06-19 ENCOUNTER — Other Ambulatory Visit: Payer: Self-pay

## 2018-06-19 VITALS — BP 113/83 | HR 98 | Temp 98.0°F | Ht 71.0 in | Wt 244.6 lb

## 2018-06-19 DIAGNOSIS — E119 Type 2 diabetes mellitus without complications: Secondary | ICD-10-CM

## 2018-06-19 DIAGNOSIS — Z01818 Encounter for other preprocedural examination: Secondary | ICD-10-CM

## 2018-06-19 DIAGNOSIS — Z794 Long term (current) use of insulin: Secondary | ICD-10-CM | POA: Diagnosis not present

## 2018-06-19 MED ORDER — GLIMEPIRIDE 4 MG PO TABS: 4.0000 mg | ORAL_TABLET | Freq: Every day | ORAL | 5 refills | Status: DC

## 2018-06-19 MED ORDER — INSULIN GLARGINE 100 UNIT/ML SOLOSTAR PEN: 40.0000 [IU] | PEN_INJECTOR | Freq: Every day | SUBCUTANEOUS | 5 refills | Status: DC

## 2018-06-19 MED FILL — LANTUS SOLOSTAR 100 UNITS/M: 100 | 30 days supply | Qty: 12 | Fill #0

## 2018-06-19 MED FILL — GLIMEPIRIDE 4 MG TABLET: 4 | 30 days supply | Qty: 30 | Fill #0

## 2018-06-19 NOTE — Telephone Encounter (Signed)
Patient called needing Rx refilled (Vicodin 7.5) The number to contact patient is [phone]

## 2018-06-19 NOTE — Patient Instructions (Signed)
Diabetes Basics    Diabetes (diabetes mellitus) is a long-term (chronic) disease. It occurs when the body does not properly use sugar (glucose) that is released from food after you eat.  Diabetes may be caused by one or both of these problems:  · Your pancreas does not make enough of a hormone called insulin.  · Your body does not react in a normal way to insulin that it makes.  Insulin lets sugars (glucose) go into cells in your body. This gives you energy. If you have diabetes, sugars cannot get into cells. This causes high blood sugar (hyperglycemia).  Follow these instructions at home:  How is diabetes treated?  You may need to take insulin or other diabetes medicines daily to keep your blood sugar in balance. Take your diabetes medicines every day as told by your doctor. List your diabetes medicines here:  Diabetes medicines  · Name of medicine: ______________________________  ? Amount (dose): _______________ Time (a.m./p.m.): _______________ Notes: ___________________________________  · Name of medicine: ______________________________  ? Amount (dose): _______________ Time (a.m./p.m.): _______________ Notes: ___________________________________  · Name of medicine: ______________________________  ? Amount (dose): _______________ Time (a.m./p.m.): _______________ Notes: ___________________________________  If you use insulin, you will learn how to give yourself insulin by injection. You may need to adjust the amount based on the food that you eat. List the types of insulin you use here:  Insulin  · Insulin type: ______________________________  ? Amount (dose): _______________ Time (a.m./p.m.): _______________ Notes: ___________________________________  · Insulin type: ______________________________  ? Amount (dose): _______________ Time (a.m./p.m.): _______________ Notes: ___________________________________  · Insulin type: ______________________________  ? Amount (dose): _______________ Time (a.m./p.m.):  _______________ Notes: ___________________________________  · Insulin type: ______________________________  ? Amount (dose): _______________ Time (a.m./p.m.): _______________ Notes: ___________________________________  · Insulin type: ______________________________  ? Amount (dose): _______________ Time (a.m./p.m.): _______________ Notes: ___________________________________  How do I manage my blood sugar?    Check your blood sugar levels using a blood glucose monitor as directed by your doctor.  Your doctor will set treatment goals for you. Generally, you should have these blood sugar levels:  · Before meals (preprandial): 80-130 mg/dL (4.4-7.2 mmol/L).  · After meals (postprandial): below 180 mg/dL (10 mmol/L).  · A1c level: less than 7%.  Write down the times that you will check your blood sugar levels:  Blood sugar checks  · Time: _______________ Notes: ___________________________________  · Time: _______________ Notes: ___________________________________  · Time: _______________ Notes: ___________________________________  · Time: _______________ Notes: ___________________________________  · Time: _______________ Notes: ___________________________________  · Time: _______________ Notes: ___________________________________    What do I need to know about low blood sugar?  Low blood sugar is called hypoglycemia. This is when blood sugar is at or below 70 mg/dL (3.9 mmol/L). Symptoms may include:  · Feeling:  ? Hungry.  ? Worried or nervous (anxious).  ? Sweaty and clammy.  ? Confused.  ? Dizzy.  ? Sleepy.  ? Sick to your stomach (nauseous).  · Having:  ? A fast heartbeat.  ? A headache.  ? A change in your vision.  ? Tingling or no feeling (numbness) around the mouth, lips, or tongue.  ? Jerky movements that you cannot control (seizure).  · Having trouble with:  ? Moving (coordination).  ? Sleeping.  ? Passing out (fainting).  ? Getting upset easily (irritability).  Treating low blood sugar  To treat low blood  sugar, eat or drink something sugary right away. If you can think clearly and swallow safely, follow the 15:15   rule:  · Take 15 grams of a fast-acting carb (carbohydrate). Talk with your doctor about how much you should take.  · Some fast-acting carbs are:  ? Sugar tablets (glucose pills). Take 3-4 glucose pills.  ? 6-8 pieces of hard candy.  ? 4-6 oz (120-150 mL) of fruit juice.  ? 4-6 oz (120-150 mL) of regular (not diet) soda.  ? 1 Tbsp (15 mL) honey or sugar.  · Check your blood sugar 15 minutes after you take the carb.  · If your blood sugar is still at or below 70 mg/dL (3.9 mmol/L), take 15 grams of a carb again.  · If your blood sugar does not go above 70 mg/dL (3.9 mmol/L) after 3 tries, get help right away.  · After your blood sugar goes back to normal, eat a meal or a snack within 1 hour.  Treating very low blood sugar  If your blood sugar is at or below 54 mg/dL (3 mmol/L), you have very low blood sugar (severe hypoglycemia). This is an emergency. Do not wait to see if the symptoms will go away. Get medical help right away. Call your local emergency services (911 in the [place] Do not drive yourself to the hospital.  Questions to ask your health care provider  · Do I need to meet with a diabetes educator?  · What equipment will I need to care for myself at home?  · What diabetes medicines do I need? When should I take them?  · How often do I need to check my blood sugar?  · What number can I call if I have questions?  · When is my next doctor's visit?  · Where can I find a support group for people with diabetes?  Where to find more information  · American Diabetes Association: www.diabetes.org  · American Association of Diabetes Educators: www.diabeteseducator.org/patient-resources  Contact a doctor if:  · Your blood sugar is at or above 240 mg/dL (13.3 mmol/L) for 2 days in a row.  · You have been sick or have had a fever for 2 days or more, and you are not getting better.  · You have any of these  problems for more than 6 hours:  ? You cannot eat or drink.  ? You feel sick to your stomach (nauseous).  ? You throw up (vomit).  ? You have watery poop (diarrhea).  Get help right away if:  · Your blood sugar is lower than 54 mg/dL (3 mmol/L).  · You get confused.  · You have trouble:  ? Thinking clearly.  ? Breathing.  Summary  · Diabetes (diabetes mellitus) is a long-term (chronic) disease. It occurs when the body does not properly use sugar (glucose) that is released from food after digestion.  · Take insulin and diabetes medicines as told.  · Check your blood sugar every day, as often as told.  · Keep all follow-up visits as told by your doctor. This is important.  This information is not intended to replace advice given to you by your health care provider. Make sure you discuss any questions you have with your health care provider.  Document Released: 09/01/2017 Document Revised: 11/20/2017 Document Reviewed: 09/01/2017  [employer]vier Interactive Patient Education © 2019 [employer]

## 2018-06-19 NOTE — Progress Notes (Signed)
Subjective:  Patient ID: [name], adult    DOB: 02-09-1971  Age: 48 y.o. MRN: [id number]  CC: Glucose log review.   HPI  DAMICHAEL HOFMAN a 48 y.o.malewith a medical history of Transgenderism, DM2, HIV, HTN, Tobacco use disorder, Testicular mass, Perirectal abscess, DDD, LBP pain w/sciatica, depression, and anxiety presents for f/u of DM2. Last A1c was 9.3% nearly four weeks ago. Pt trying to reach goal of less than 8.0% so he may have a threelevel lumbar fusion L3-4, L4-5, andL5-S1. I have directed patient to use Lantus 25 units and to test blood sugar at home TID. I had advised for him to maintain communication of his blood sugar over the past few weeks which he has not done. Brings his glucometer with him. Has been testing his sugar one to three times per day. Glucose low is 111 and high 328. Most readings fall between 150 and 200. Pt has followed a strict low carb diet and is as active as his body allows. He has also cut down on the amount he is smoking. He is focused on living a healthier lifestyle and qualifying for his surgery. Feeling depressed and anxious because he has not consistently had glucose readings between 80 - 150. Afraid he will have to live with his pain for longer and will not be able to proceed with surgery.    Outpatient Medications Prior to Visit  Medication Sig Dispense Refill  . ACCU-CHEK AVIVA PLUS test strip 1 each by Other route 3 (three) times daily. 100 each 5  . ACCU-CHEK SOFTCLIX LANCETS lancets 1 each by Other route 3 (three) times daily. 100 each 5  . albuterol (PROVENTIL HFA;VENTOLIN HFA) 108 (90 Base) MCG/ACT inhaler Inhale 2 puffs into the lungs every 6 (six) hours as needed for wheezing or shortness of breath. 1 Inhaler 6  . aspirin 81 MG tablet Take 1 tablet (81 mg total) by mouth daily. 30 tablet   . BIKTARVY 50-200-25 MG TABS tablet TAKE 1 TABLET BY MOUTH DAILY. 30 tablet 0  . blood glucose meter kit and supplies Dispense based on patient and  insurance preference. Use up to four times daily as directed. (FOR ICD-10 E10.9, E11.9). 1 each 0  . diclofenac (VOLTAREN) 50 MG EC tablet Take 1 tablet (50 mg total) by mouth 3 (three) times daily. 90 tablet 3  . escitalopram (LEXAPRO) 20 MG tablet TAKE 1 TABLET BY MOUTH AT BEDTIME 30 tablet 5  . gabapentin (NEURONTIN) 300 MG capsule Take 1 capsule (300 mg total) by mouth at bedtime for 4 days, THEN 1 capsule (300 mg total) 2 (two) times daily for 4 days, THEN 1 capsule (300 mg total) 3 (three) times daily for 4 days. 90 capsule 3  . glimepiride (AMARYL) 4 MG tablet Take 1 tablet (4 mg total) by mouth daily before breakfast. 30 tablet 5  . HYDROcodone-acetaminophen (NORCO) 7.5-325 MG tablet Take 1-2 tablets by mouth every 6 (six) hours as needed for moderate pain. 50 tablet 0  . hydrOXYzine (ATARAX/VISTARIL) 10 MG tablet TAKE 1 TABLET (10 MG TOTAL) BY MOUTH 2 (TWO) TIMES DAILY. 60 tablet 1  . Insulin Glargine (LANTUS SOLOSTAR) 100 UNIT/ML Solostar Pen Inject 25 Units into the skin daily. 3 pen 2  . Insulin Pen Needle (PEN NEEDLES) 31G X 8 MM MISC Inject 1 each into the skin daily. 100 each 1  . metFORMIN (GLUCOPHAGE) 1000 MG tablet Take 1 tablet (1,000 mg total) by mouth 2 (two) times daily with  a meal. 180 tablet 1  . spironolactone (ALDACTONE) 50 MG tablet TAKE 1 TABLET BY MOUTH ONCE DAILY 30 tablet 5  . traMADol (ULTRAM) 50 MG tablet Take 1 tablet (50 mg total) by mouth every 6 (six) hours as needed. 50 tablet 0  . varenicline (CHANTIX STARTING MONTH PAK) 0.5 MG X 11 & 1 MG X 42 tablet Take one 0.5 mg tablet by mouth once daily for 3 days, then increase to one 0.5 mg tablet twice daily for 4 days, then increase to one 1 mg tablet twice daily. (Patient not taking: Reported on 05/23/2018) 53 tablet 0   No facility-administered medications prior to visit.      ROS Review of Systems  Constitutional: Negative for chills, fever and malaise/fatigue.  Eyes: Negative for blurred vision.   Respiratory: Negative for shortness of breath.   Cardiovascular: Negative for chest pain and palpitations.  Gastrointestinal: Negative for abdominal pain and nausea.  Genitourinary: Negative for dysuria and hematuria.  Musculoskeletal: Positive for back pain. Negative for joint pain and myalgias.  Skin: Negative for rash.  Neurological: Negative for tingling and headaches.  Psychiatric/Behavioral: Positive for depression. The patient is not nervous/anxious.     Objective:  There were no vitals taken for this visit.  BP/Weight 05/23/2018 05/03/2018 24/01/2499  Systolic BP 370 488 -  Diastolic BP 87 92 -  Wt. (Lbs) 240.6 240 240.8  BMI 33.56 33.47 33.58      Physical Exam Vitals signs reviewed.  Constitutional:      Comments: Well developed, well nourished, uncomfortable 2/2 lower back pain, polite  HENT:     Head: Normocephalic and atraumatic.     Mouth/Throat:     Comments: No oral thrush Eyes:     General: No scleral icterus. Neck:     Musculoskeletal: Normal range of motion and neck supple.     Thyroid: No thyromegaly.  Cardiovascular:     Rate and Rhythm: Normal rate and regular rhythm.     Heart sounds: Normal heart sounds.  Pulmonary:     Effort: Pulmonary effort is normal.     Breath sounds: Normal breath sounds.  Abdominal:     General: Bowel sounds are normal.     Palpations: Abdomen is soft.     Tenderness: There is no abdominal tenderness.  Skin:    General: Skin is warm and dry.     Coloration: Skin is not pale.     Findings: No erythema or rash.  Neurological:     Mental Status: She is alert and oriented to person, place, and time.  Psychiatric:        Behavior: Behavior normal.        Thought Content: Thought content normal.      Assessment & Plan:    1. Type 2 diabetes mellitus without complication, with long-term current use of insulin (HCC) - Pt has shown great strides in reducing his blood sugar. Lifestyle, diet, and compliance with  medications have reduced blood sugars significantly. I recommend an increase of Lantus to 40 units to help patient reach his goal A1c of less than 7%. (less than 8% for surgery). I feel confident patient will be able to obtain glucose control and have written letter stating he should proceed with surgery.  - Increase Insulin Glargine (LANTUS SOLOSTAR) 100 UNIT/ML Solostar Pen; Inject 40 Units into the skin daily.  Dispense: 4 pen; Refill: 5 - Refill glimepiride (AMARYL) 4 MG tablet; Take 1 tablet (4 mg total) by mouth  daily before breakfast.  Dispense: 30 tablet; Refill: 5 *Will consider switching glimepiride 4 mg to DPP4i.   2. Pre-operative clearance - Insulin Glargine (LANTUS SOLOSTAR) 100 UNIT/ML Solostar Pen; Inject 40 Units into the skin daily.  Dispense: 4 pen; Refill: 5 - Pt has shown great strides in reducing his blood sugar. Lifestyle, diet, and compliance with medications have reduced blood sugars significantly. I recommend an increase of Lantus to 40 units to help patient reach his goal A1c of less than 7%. (less than 8% for surgery). I feel confident patient will be able to obtain glucose control and have written letter stating he should proceed with surgery.     Meds ordered this encounter  Medications  . Insulin Glargine (LANTUS SOLOSTAR) 100 UNIT/ML Solostar Pen    Sig: Inject 40 Units into the skin daily.    Dispense:  4 pen    Refill:  5    Order Specific Question:   Supervising Provider    Answer:   Charlott Rakes [[id number]]  . glimepiride (AMARYL) 4 MG tablet    Sig: Take 1 tablet (4 mg total) by mouth daily before breakfast.    Dispense:  30 tablet    Refill:  5    Order Specific Question:   Supervising Provider    Answer:   Charlott Rakes [[id number]]    Follow-up: Return in about 2 months (around 08/18/2018) for A1c and post op.Clent Demark PA

## 2018-06-21 ENCOUNTER — Other Ambulatory Visit (INDEPENDENT_AMBULATORY_CARE_PROVIDER_SITE_OTHER): Payer: Self-pay | Admitting: Surgery

## 2018-06-21 MED ORDER — HYDROCODONE-ACETAMINOPHEN 7.5-325 MG PO TABS: 1.0000 | ORAL_TABLET | Freq: Four times a day (QID) | ORAL | 0 refills | Status: DC | PRN

## 2018-06-21 NOTE — Telephone Encounter (Signed)
Okay we are going to change medication to tramadol 50 mg 1 tab p.o. every 8 hours as needed for pain number 60 tablets no refills until surgery.

## 2018-06-21 NOTE — Telephone Encounter (Signed)
I spoke with [name], so we are waiting for his A1c to come down, on 05/23/18 it was 9.3.  His PCP is working with him to get it down.  He was seen by his PCP on 06/19/18. Otherwise he has been cleared for surgery.

## 2018-06-21 NOTE — Telephone Encounter (Signed)
Hydrocodone was denied and entered in error.

## 2018-06-21 NOTE — Telephone Encounter (Signed)
Changed to tramadol 50 mg 1 tab p.o. every 8 hours as needed for pain number 60 tablets no refills until surgery.

## 2018-06-21 NOTE — Telephone Encounter (Signed)
Patient called to inquire about his refill request.  I advised her that Fayrene Fearing is changing it to the Tramadol.  She was upset with this because she states that the Tramadol does not help and the Vicodin barely helped as well.  CB#[phone].  Thank you.

## 2018-06-21 NOTE — Telephone Encounter (Signed)
What is patient's status for scheduling surgery?

## 2018-06-22 NOTE — Telephone Encounter (Signed)
See message below from patient, I have not called in yet

## 2018-06-25 ENCOUNTER — Ambulatory Visit: Payer: Medicaid Other | Admitting: Adult Health

## 2018-06-25 MED ORDER — HYDROCODONE-ACETAMINOPHEN 7.5-325 MG PO TABS: 1.0000 | ORAL_TABLET | Freq: Four times a day (QID) | ORAL | 0 refills | Status: DC | PRN

## 2018-06-25 NOTE — Addendum Note (Signed)
Addended by: Penne Lash, Neysa Bonito N on: 06/25/2018 12:19 PM   Modules accepted: Orders

## 2018-06-25 NOTE — Progress Notes (Deleted)
[facility] [address] [place]. [place] [address] [phone]       OFFICE FOLLOW UP NOTE  [name] Date of Birth:  1970/09/12 Medical Record Number:  [id number]   Reason for Referral:   complicated migraine follow up  CHIEF COMPLAINT:  No chief complaint on file.   HPI:  Interval history 06/25/2018: Patient is being seen today for 22-monthfollow-up visit.  She continues to do well from a neurological standpoint.  Topamax discontinued as requested by patient and denies any additional migraines.  She continues on aspirin without side effects of bleeding or bruising.   Patient was found to have lumbar disc herniation with radiculopathy and was recommended for 3 level lumbar fusion L3-4, L4-5 and L5-S1.  She has recently obtained clearance by endocrinology to proceed with procedure as glucose levels have been stable.  12/21/2017 visit: Patient is being seen today for hospital follow-up.  She states that overall she is been doing well without additional or migraines since hospital discharge.  She continues to take Topamax 25 mg at night.  Patient was frustrated in beginning appointment because she felt as though she was getting mixed answers at the hospital and was unsure exactly what was going on.  She is also frustrated with a diagnosis of migraines due to never having migraines before nor family history.  Patient was also told during inpatient that she did in fact have a stroke but then was confused due to them being told that she did not have a stroke.  MRI scan reviewed with patient and clarified [name].  Also reiterated that LP was not needed at that time for HIV/CNS infection reasons as patient was under the impression that she was told this needed to be done.  Patient was also not aware to be discharged on aspirin 81 mg as this was not on her medication list at discharge.  Patient is being followed by Dr. GAltamease OilerPCP for cholesterol, blood pressure  and diabetes control.  Blood pressure satisfactory today at 112/71.  Patient states that she has been having a difficult time sleeping at night where she feels excessive daytime fatigue but then when she lays down to actually sleep at night, she states that her heart is racing thinking about different things and need to get done or things that she could be doing.  Patient has never undergone OSA testing or has a history.  Patient is on Lexapro for depression/anxiety.  Patient denies any other neurological concern.  Hospital admission 10/30/2017: [name] ABERNETHYis a 48y.o. transgender male with history of DM, HTN, HIV, perirectal abscess, smoker admitted for intermittent visual disturbance with HA for 2 months.   MRI reports right occipital punctate infarcts however per Dr. XPhoebe Sharpsnotes he did not believe there is an acute infarct on the MRI scan.  CTA head and neck showed mild bilateral ICA proximal stenosis.  LDL 129 and patient was discharged on Lipitor 20 mg daily.  A1c 12.9 and recommended tight glycemic control but also advised patient to obtain PCP in order to better control A1c level.  Patient was not on antithrombotic PTA and recommended aspirin 81 mg daily.  Recommended Topamax with titration for headache management.  Patient was discharged in stable condition.      ROS:   14 system review of systems performed and negative with exception of shortness of breath, wheezing, depression, anxiety, none of sleep, decreased energy, just interest in activities, racing thoughts, insomnia, sleepiness  and restless legs  PMH:  Past Medical History:  Diagnosis Date  . Diabetes mellitus   . HIV disease ([place])   . Hypertension   . MRSA carrier   . Perirectal abscess 03/30/2017  . Polyuria 04/27/2015  . Poorly controlled diabetes mellitus ([place]) 04/27/2015  . Testicular mass 06/20/2016  . Transgender 04/27/2015    PSH:  Past Surgical History:  Procedure Laterality Date  . APPENDECTOMY      Social  History:  Social History   Socioeconomic History  . Marital status: Single    Spouse name: Not on file  . Number of children: Not on file  . Years of education: Not on file  . Highest education level: Not on file  Occupational History  . Not on file  Social Needs  . Financial resource strain: Not on file  . Food insecurity:    Worry: Not on file    Inability: Not on file  . Transportation needs:    Medical: Not on file    Non-medical: Not on file  Tobacco Use  . Smoking status: Current Every Day Smoker    Packs/day: 1.00    Years: 27.00    Pack years: 27.00    Types: Cigarettes  . Smokeless tobacco: Never Used  Substance and Sexual Activity  . Alcohol use: No    Alcohol/week: 0.0 standard drinks  . Drug use: Yes    Types: Marijuana    Comment: some daily  . Sexual activity: Yes    Partners: Male    Comment: pt. given condoms  Lifestyle  . Physical activity:    Days per week: Not on file    Minutes per session: Not on file  . Stress: Not on file  Relationships  . Social connections:    Talks on phone: Not on file    Gets together: Not on file    Attends religious service: Not on file    Active member of club or organization: Not on file    Attends meetings of clubs or organizations: Not on file    Relationship status: Not on file  . Intimate partner violence:    Fear of current or ex partner: Not on file    Emotionally abused: Not on file    Physically abused: Not on file    Forced sexual activity: Not on file  Other Topics Concern  . Not on file  Social History Narrative  . Not on file    Family History: No family history on file.  Medications:   Current Outpatient Medications on File Prior to Visit  Medication Sig Dispense Refill  . ACCU-CHEK AVIVA PLUS test strip 1 each by Other route 3 (three) times daily. 100 each 5  . ACCU-CHEK SOFTCLIX LANCETS lancets 1 each by Other route 3 (three) times daily. 100 each 5  . albuterol (PROVENTIL HFA;VENTOLIN  HFA) 108 (90 Base) MCG/ACT inhaler Inhale 2 puffs into the lungs every 6 (six) hours as needed for wheezing or shortness of breath. 1 Inhaler 6  . aspirin 81 MG tablet Take 1 tablet (81 mg total) by mouth daily. 30 tablet   . BIKTARVY 50-200-25 MG TABS tablet TAKE 1 TABLET BY MOUTH DAILY. 30 tablet 0  . blood glucose meter kit and supplies Dispense based on patient and insurance preference. Use up to four times daily as directed. (FOR ICD-10 E10.9, E11.9). 1 each 0  . diclofenac (VOLTAREN) 50 MG EC tablet Take 1 tablet (50 mg total) by mouth 3 (  three) times daily. 90 tablet 3  . escitalopram (LEXAPRO) 20 MG tablet TAKE 1 TABLET BY MOUTH AT BEDTIME 30 tablet 5  . gabapentin (NEURONTIN) 300 MG capsule Take 1 capsule (300 mg total) by mouth at bedtime for 4 days, THEN 1 capsule (300 mg total) 2 (two) times daily for 4 days, THEN 1 capsule (300 mg total) 3 (three) times daily for 4 days. 90 capsule 3  . glimepiride (AMARYL) 4 MG tablet Take 1 tablet (4 mg total) by mouth daily before breakfast. 30 tablet 5  . hydrOXYzine (ATARAX/VISTARIL) 10 MG tablet TAKE 1 TABLET (10 MG TOTAL) BY MOUTH 2 (TWO) TIMES DAILY. 60 tablet 1  . Insulin Glargine (LANTUS SOLOSTAR) 100 UNIT/ML Solostar Pen Inject 40 Units into the skin daily. 4 pen 5  . Insulin Pen Needle (PEN NEEDLES) 31G X 8 MM MISC Inject 1 each into the skin daily. 100 each 1  . metFORMIN (GLUCOPHAGE) 1000 MG tablet Take 1 tablet (1,000 mg total) by mouth 2 (two) times daily with a meal. 180 tablet 1  . spironolactone (ALDACTONE) 50 MG tablet TAKE 1 TABLET BY MOUTH ONCE DAILY 30 tablet 5  . traMADol (ULTRAM) 50 MG tablet Take 1 tablet (50 mg total) by mouth every 6 (six) hours as needed. 50 tablet 0  . varenicline (CHANTIX STARTING MONTH PAK) 0.5 MG X 11 & 1 MG X 42 tablet Take one 0.5 mg tablet by mouth once daily for 3 days, then increase to one 0.5 mg tablet twice daily for 4 days, then increase to one 1 mg tablet twice daily. (Patient not taking: Reported  on 05/23/2018) 53 tablet 0   No current facility-administered medications on file prior to visit.     Allergies:   Allergies  Allergen Reactions  . Propoxyphene N-Acetaminophen Other (See Comments)    Stomach cramps     Physical Exam  There were no vitals filed for this visit. There is no height or weight on file to calculate BMI. No exam data present  General: Obese Caucasian transgender male, seated, in no evident distress Head: head normocephalic and atraumatic.   Neck: supple with no carotid or supraclavicular bruits Cardiovascular: regular rate and rhythm, no murmurs Musculoskeletal: no deformity Skin:  no rash/petichiae Vascular:  Normal pulses all extremities  Neurologic Exam Mental Status: Awake and fully alert. Oriented to place and time. Recent and remote memory intact. Attention span, concentration and fund of knowledge appropriate. Mood and affect appropriate.  Cranial Nerves: Fundoscopic exam reveals sharp disc margins. Pupils equal, briskly reactive to light. Extraocular movements full without nystagmus. Visual fields full to confrontation. Hearing intact. Facial sensation intact. Face, tongue, palate moves normally and symmetrically.  Motor: Normal bulk and tone. Normal strength in all tested extremity muscles. Sensory.: intact to touch , pinprick , position and vibratory sensation.  Coordination: Rapid alternating movements normal in all extremities. Finger-to-nose and heel-to-shin performed accurately bilaterally. Gait and Station: Arises from chair without difficulty. Stance is normal. Gait demonstrates normal stride length and balance . Able to heel, toe and tandem walk without difficulty.  Reflexes: 1+ and symmetric. Toes downgoing.   Diagnostic Data (Labs, Imaging, Testing)  MR BRAIN W WO CONTRAST 10/31/2017 IMPRESSION: 1. Small focus of signal abnormality at the right occipital pole as above, most consistent with a small subacute ischemic infarct. 2.  Otherwise normal brain MRI.  CT ANGIO HEAD W OR WO CONTRAST CT ANGIO NECK W OR WO CONTRAST 10/31/2017 IMPRESSION: 1. Negative CTA with no large  vessel occlusion identified. 2. 50% atheromatous stenosis at the proximal right ICA. 3. Mild atheromatous narrowing at the proximal left ICA with stenosis of up to 35-40% by NASCET criteria. 4. Widely patent vertebral arteries within the neck. 5. No hemodynamically significant or correctable stenosis within the intracranial circulation.    ASSESSMENT: [name] is a 48 y.o. year old adult here with a complicated migraine on 12/09/34. Vascular risk factors include HTN, HLD, and DM.     PLAN: -Start aspirin 81 mg daily for stroke prevention -It was agreed during this appointment for patient to stop Topamax 25 mg nightly that she believes this is not needed -recommended to restart this medication if she feels as though migraines are restarting -F/u with PCP regarding your HLD, HTN and DM management -continue to monitor BP at home -Continue to stay active and eat healthy -Maintain strict control of hypertension with blood pressure goal below 130/90, diabetes with hemoglobin A1c goal below 6.5% and cholesterol with LDL cholesterol (bad cholesterol) goal below 70 mg/dL. I also advised the patient to eat a healthy diet with plenty of whole grains, cereals, fruits and vegetables, exercise regularly and maintain ideal body weight.  Follow up in 6 months or call earlier if needed   Greater than 50% of time during this 25 minute visit was spent on counseling,explanation of diagnosis of complicated migraine, reviewing risk factor management of HTN, HLD and DM, planning of further management, discussion with patient and family and coordination of care   Venancio Poisson, [facility]  [facility] [address] [place], [place] [address]  Phone [phone] Fax [phone]

## 2018-06-26 ENCOUNTER — Encounter: Payer: Self-pay | Admitting: Adult Health

## 2018-07-03 ENCOUNTER — Telehealth (INDEPENDENT_AMBULATORY_CARE_PROVIDER_SITE_OTHER): Payer: Self-pay | Admitting: Physician Assistant

## 2018-07-03 DIAGNOSIS — M5136 Other intervertebral disc degeneration, lumbar region: Secondary | ICD-10-CM

## 2018-07-03 DIAGNOSIS — M5126 Other intervertebral disc displacement, lumbar region: Secondary | ICD-10-CM

## 2018-07-03 NOTE — Telephone Encounter (Signed)
Patient called to request a referral for Neurosurgery  [facility] to see Chest [place]  [address] [place], [place] [address] Phone: [phone]  Please advise [phone]  Patient is aware that he might need an ov first. But will like PCP advice first  Thank you Louisa Second

## 2018-07-03 NOTE — Telephone Encounter (Signed)
Sent to Dr. Newlin. Amada Hallisey S Shaw Dobek, CMA  

## 2018-07-04 NOTE — Telephone Encounter (Signed)
Referral has been placed. 

## 2018-07-10 ENCOUNTER — Other Ambulatory Visit: Payer: Self-pay | Admitting: Infectious Disease

## 2018-07-11 ENCOUNTER — Other Ambulatory Visit: Payer: Self-pay | Admitting: Nurse Practitioner

## 2018-07-11 DIAGNOSIS — Z794 Long term (current) use of insulin: Principal | ICD-10-CM

## 2018-07-11 DIAGNOSIS — E119 Type 2 diabetes mellitus without complications: Secondary | ICD-10-CM

## 2018-07-11 MED ORDER — ACCU-CHEK AVIVA PLUS VI STRP: 1.0000 | ORAL_STRIP | Freq: Three times a day (TID) | 5 refills | Status: AC

## 2018-07-12 MED FILL — ACCU-CHEK AVIVA PLUS STRP: 30 days supply | Qty: 100 | Fill #0

## 2018-07-17 MED FILL — ESCITALOPRAM 20 MG TABLET: 20 | 30 days supply | Qty: 30 | Fill #4

## 2018-07-17 MED FILL — LANTUS SOLOSTAR 100 UNITS/M: 100 | 30 days supply | Qty: 12 | Fill #1

## 2018-07-17 MED FILL — SPIRONOLACTONE 50 MG TABS: 50 | 30 days supply | Qty: 30 | Fill #4

## 2018-07-17 MED FILL — BIKTARVY 50-200-25 MG TABS: 50-200-25 | 30 days supply | Qty: 30 | Fill #0

## 2018-07-17 MED FILL — GLIMEPIRIDE 4 MG TABLET: 4 | 30 days supply | Qty: 30 | Fill #1

## 2018-07-19 MED FILL — HYDROCODON-APAP 7.5-325: 7.5-325 | 5 days supply | Qty: 60 | Fill #0

## 2018-08-02 ENCOUNTER — Ambulatory Visit: Payer: Medicaid Other | Admitting: Infectious Diseases

## 2018-08-02 MED FILL — HYDROCODON-APAP 7.5-325: 7.5-325 | 7 days supply | Qty: 28 | Fill #0

## 2018-08-07 ENCOUNTER — Ambulatory Visit: Payer: Medicaid Other | Admitting: Infectious Disease

## 2018-08-09 ENCOUNTER — Other Ambulatory Visit: Payer: Self-pay

## 2018-08-09 ENCOUNTER — Encounter
Admission: RE | Admit: 2018-08-09 | Discharge: 2018-08-09 | Disposition: A | Discharge status: Home / Self Care | Payer: Medicaid Other | Source: Ambulatory Visit | Attending: Neurosurgery | Admitting: Neurosurgery

## 2018-08-09 DIAGNOSIS — Z01812 Encounter for preprocedural laboratory examination: Secondary | ICD-10-CM | POA: Insufficient documentation

## 2018-08-09 HISTORY — DX: Depression, unspecified: F32.A

## 2018-08-09 HISTORY — DX: Gastro-esophageal reflux disease without esophagitis: K21.9

## 2018-08-09 HISTORY — DX: Unspecified asthma, uncomplicated: J45.909

## 2018-08-09 HISTORY — DX: Pneumonia, unspecified organism: J18.9

## 2018-08-09 HISTORY — DX: Major depressive disorder, single episode, unspecified: F32.9

## 2018-08-09 LAB — URINALYSIS, ROUTINE W REFLEX MICROSCOPIC
Bacteria, UA: NONE SEEN
Bilirubin Urine: NEGATIVE
GLUCOSE, UA: NEGATIVE mg/dL
Hgb urine dipstick: NEGATIVE
Ketones, ur: NEGATIVE mg/dL
Leukocytes,Ua: NEGATIVE
Nitrite: NEGATIVE
Protein, ur: 30 mg/dL — AB
Specific Gravity, Urine: 1.029 (ref 1.005–1.030)
pH: 5 (ref 5.0–8.0)

## 2018-08-09 LAB — BASIC METABOLIC PANEL
ANION GAP: 10 (ref 5–15)
BUN: 19 mg/dL (ref 6–20)
CO2: 27 mmol/L (ref 22–32)
Calcium: 9.4 mg/dL (ref 8.9–10.3)
Chloride: 103 mmol/L (ref 98–111)
Creatinine, Ser: 0.62 mg/dL (ref 0.61–1.24)
GFR calc Af Amer: >60 mL/min (ref >60)
GFR calc non Af Amer: >60 mL/min (ref >60)
Glucose, Bld: 108 mg/dL — ABNORMAL HIGH (ref 70–99)
Potassium: 3.8 mmol/L (ref 3.5–5.1)
Sodium: 140 mmol/L (ref 135–145)

## 2018-08-09 LAB — CBC
HCT: 46.5 % (ref 39.0–52.0)
Hemoglobin: 16.4 g/dL (ref 13.0–17.0)
MCH: 32.4 pg (ref 26.0–34.0)
MCHC: 35.3 g/dL (ref 30.0–36.0)
MCV: 91.9 fL (ref 80.0–100.0)
NRBC: 0 % (ref 0.0–0.2)
Platelets: 344 10*3/uL (ref 150–400)
RBC: 5.06 MIL/uL (ref 4.22–5.81)
RDW: 12 % (ref 11.5–15.5)
WBC: 14.9 10*3/uL — AB (ref 4.0–10.5)

## 2018-08-09 LAB — TYPE AND SCREEN
ABO/RH(D): O POS
Antibody Screen: NEGATIVE

## 2018-08-09 LAB — SURGICAL PCR SCREEN
MRSA, PCR: POSITIVE — AB
Staphylococcus aureus: POSITIVE — AB

## 2018-08-09 LAB — PROTIME-INR
INR: 1 (ref 0.8–1.2)
Prothrombin Time: 12.9 seconds (ref 11.4–15.2)

## 2018-08-09 LAB — HEMOGLOBIN A1C
HEMOGLOBIN A1C: 6.9 % — AB (ref 4.8–5.6)
Mean Plasma Glucose: 151.33 mg/dL

## 2018-08-09 LAB — APTT: APTT: 34 s (ref 24–36)

## 2018-08-09 NOTE — Pre-Procedure Instructions (Signed)
Denies any cardiac symptoms and stated that she can climb a fight of stairs and walk a mile without chest pain or shortness of breath. Did state that she has had recent nausea and a couple episodes of vomiting since starting on Chantix. Patient was going to stop taking the Chantix and agreed to notify the surgeon if it doesn't go away after stopping or became ill prior to surgery. Patient unaware of getting hemoglobin AIC (lab) appointment with [facility] tomorrow; thought she was getting all lab work done here today. Message left for Kendelyn at Dr. Myer Haff office about the lab work. Hemoglobin AIC will be collected here today along with other lab work that was ordered for surgery. Results will be faxed to Dr. Lucienne Capers office once resulted.

## 2018-08-09 NOTE — Patient Instructions (Signed)
Your procedure is scheduled on: Wednesday, August 22, 2018 Report to Day Surgery on the 2nd floor of the [facility] To find out your arrival time, please call [phone] between 1PM - 3PM on: Tuesday, March 10  REMEMBER: Instructions that are not followed completely may result in serious medical risk, up to and including death; or upon the discretion of your surgeon and anesthesiologist your surgery may need to be rescheduled.  Do not eat food after midnight the night before surgery.  No gum chewing, lozengers or hard candies.  You may however, drink water up to 2 hours before you are scheduled to arrive for your surgery. Do not drink anything within 2 hours of the start of your surgery.  No Alcohol for 24 hours before or after surgery.  No Smoking including e-cigarettes for 24 hours prior to surgery.  No chewable tobacco products for at least 6 hours prior to surgery.  No nicotine patches on the day of surgery.  On the morning of surgery brush your teeth with toothpaste and water, you may rinse your mouth with mouthwash if you wish. Do not swallow any toothpaste or mouthwash.  Notify your doctor if there is any change in your medical condition (cold, fever, infection).  Do not wear jewelry, make-up, hairpins, clips or nail polish.  Do not wear lotions, powders, or perfumes.   Do not shave 48 hours prior to surgery.   Contacts and dentures may not be worn into surgery.  Do not bring valuables to the hospital, including drivers license, insurance or credit cards.  [facility] is not responsible for any belongings or valuables.   TAKE THESE MEDICATIONS THE MORNING OF SURGERY:  1.  Albuterol inhaler 2.  Hydrocodone (if needed for pain)  Use CHG Soap as directed on instruction sheet.  Use inhalers on the day of surgery and bring to the hospital.  Stop Metformin 2 days prior to surgery. Last day to take is Sunday, March 8; resume after surgery.  Take 1/2 of usual insulin  dose the night before surgery and none on the morning of surgery. Only take 20 units of Lantus the night before surgery.  Starting March 4 - Stop Anti-inflammatories (NSAIDS) such as Advil, Aleve, Ibuprofen, Motrin, Naproxen, Naprosyn and Aspirin based products such as Excedrin, Goodys Powder, BC Powder. (May take Tylenol or Acetaminophen if needed.)  Starting March 4 - Stop ANY OVER THE COUNTER supplements until after surgery.  Wear comfortable clothing (specific to your surgery type) to the hospital.  If you are being admitted to the hospital overnight, leave your suitcase in the car. After surgery it may be brought to your room.  Please call [phone] if you have any questions about these instructions.

## 2018-08-13 ENCOUNTER — Other Ambulatory Visit: Payer: Self-pay | Admitting: Infectious Disease

## 2018-08-13 LAB — NICOTINE/COTININE METABOLITES
COTININE: 12.3 ng/mL
Nicotine: <1 ng/mL

## 2018-08-14 ENCOUNTER — Telehealth: Payer: Self-pay | Admitting: Pharmacy Technician

## 2018-08-14 ENCOUNTER — Telehealth: Payer: Self-pay

## 2018-08-14 MED FILL — LANTUS SOLOSTAR 100 UNITS/M: 100 | 30 days supply | Qty: 12 | Fill #2

## 2018-08-14 MED FILL — SPIRONOLACTONE 50 MG TABS: 50 | 30 days supply | Qty: 30 | Fill #5

## 2018-08-14 MED FILL — GLIMEPIRIDE 4 MG TABLET: 4 | 30 days supply | Qty: 30 | Fill #2

## 2018-08-14 MED FILL — metFORMIN HCL 1000 MG TABS: 1000 | 90 days supply | Qty: 180 | Fill #1

## 2018-08-14 MED FILL — ESCITALOPRAM 20 MG TABLET: 20 | 30 days supply | Qty: 30 | Fill #5

## 2018-08-14 MED FILL — ACCU-CHEK AVIVA PLUS STRP: 30 days supply | Qty: 100 | Fill #1

## 2018-08-14 NOTE — Telephone Encounter (Signed)
Patient has not been seen by Dr. [name] since October 2018. FYI.

## 2018-08-14 NOTE — Telephone Encounter (Signed)
Left voicemail for patient requesting a call back for an appointment. Patient will need to schedule appointment for lab work and office visit two weeks after.  Lorenso Courier, [place]

## 2018-08-14 NOTE — Telephone Encounter (Signed)
Please call patient directly to schedule follow up with Dr Lennox Laity, Zachary George, RN

## 2018-08-14 NOTE — Telephone Encounter (Signed)
[facility] Patient Advocate Encounter  Spoke with patient this afternoon about filling her Biktarvy.  She is trying to schedule an appointment but has had trouble getting through on the prompts to call back and confirm an appointment time.  She is having back surgery on 08/22/2018 and would like to be seen in the clinic before that date to get his medication before surgery.   Beulah Gandy, CPhT Specialty Pharmacy Patient [facility] Phone: [phone] Fax: [phone] 08/14/2018 3:00 PM

## 2018-08-22 ENCOUNTER — Encounter
Admission: RE | Disposition: A | Discharge status: Home / Self Care | Payer: Self-pay | Source: Ambulatory Visit | Attending: Neurosurgery

## 2018-08-22 ENCOUNTER — Inpatient Hospital Stay: Payer: Medicaid Other

## 2018-08-22 ENCOUNTER — Other Ambulatory Visit: Payer: Self-pay

## 2018-08-22 ENCOUNTER — Ambulatory Visit
Admission: RE | Admit: 2018-08-22 | Discharge: 2018-08-22 | Disposition: A | Discharge status: Home / Self Care | Payer: Medicaid Other | Source: Ambulatory Visit | Attending: Neurosurgery | Admitting: Neurosurgery

## 2018-08-22 DIAGNOSIS — Z539 Procedure and treatment not carried out, unspecified reason: Secondary | ICD-10-CM | POA: Insufficient documentation

## 2018-08-22 DIAGNOSIS — I10 Essential (primary) hypertension: Secondary | ICD-10-CM | POA: Diagnosis not present

## 2018-08-22 DIAGNOSIS — Z7982 Long term (current) use of aspirin: Secondary | ICD-10-CM | POA: Diagnosis not present

## 2018-08-22 DIAGNOSIS — E119 Type 2 diabetes mellitus without complications: Secondary | ICD-10-CM | POA: Diagnosis not present

## 2018-08-22 DIAGNOSIS — Z8673 Personal history of transient ischemic attack (TIA), and cerebral infarction without residual deficits: Secondary | ICD-10-CM | POA: Diagnosis not present

## 2018-08-22 DIAGNOSIS — M48061 Spinal stenosis, lumbar region without neurogenic claudication: Secondary | ICD-10-CM | POA: Diagnosis not present

## 2018-08-22 DIAGNOSIS — M5116 Intervertebral disc disorders with radiculopathy, lumbar region: Secondary | ICD-10-CM | POA: Diagnosis not present

## 2018-08-22 DIAGNOSIS — G8929 Other chronic pain: Secondary | ICD-10-CM | POA: Insufficient documentation

## 2018-08-22 DIAGNOSIS — M4316 Spondylolisthesis, lumbar region: Secondary | ICD-10-CM | POA: Diagnosis not present

## 2018-08-22 DIAGNOSIS — Z79899 Other long term (current) drug therapy: Secondary | ICD-10-CM | POA: Insufficient documentation

## 2018-08-22 DIAGNOSIS — Z21 Asymptomatic human immunodeficiency virus [HIV] infection status: Secondary | ICD-10-CM | POA: Insufficient documentation

## 2018-08-22 DIAGNOSIS — M4317 Spondylolisthesis, lumbosacral region: Secondary | ICD-10-CM | POA: Diagnosis not present

## 2018-08-22 DIAGNOSIS — Z419 Encounter for procedure for purposes other than remedying health state, unspecified: Secondary | ICD-10-CM

## 2018-08-22 DIAGNOSIS — Z7984 Long term (current) use of oral hypoglycemic drugs: Secondary | ICD-10-CM | POA: Diagnosis not present

## 2018-08-22 LAB — URINE DRUG SCREEN, QUALITATIVE (ARMC ONLY)
Amphetamines, Ur Screen: POSITIVE — AB
Barbiturates, Ur Screen: NOT DETECTED
Benzodiazepine, Ur Scrn: NOT DETECTED
COCAINE METABOLITE, UR ~~LOC~~: NOT DETECTED
Cannabinoid 50 Ng, Ur ~~LOC~~: POSITIVE — AB
MDMA (ECSTASY) UR SCREEN: NOT DETECTED
Methadone Scn, Ur: NOT DETECTED
Opiate, Ur Screen: POSITIVE — AB
Phencyclidine (PCP) Ur S: NOT DETECTED
Tricyclic, Ur Screen: NOT DETECTED

## 2018-08-22 LAB — ABO/RH: ABO/RH(D): O POS

## 2018-08-22 LAB — GLUCOSE, CAPILLARY: Glucose-Capillary: 186 mg/dL — ABNORMAL HIGH (ref 70–99)

## 2018-08-22 SURGERY — TRANSFORAMINAL LUMBAR INTERBODY FUSION (TLIF) WITH PEDICLE SCREW FIXATION 1 LEVEL
Anesthesia: General

## 2018-08-22 MED ORDER — ONDANSETRON HCL 4 MG/2ML IJ SOLN
INTRAMUSCULAR | Status: AC
  Filled 2018-08-22: qty 2

## 2018-08-22 MED ORDER — CEFAZOLIN SODIUM-DEXTROSE 2-4 GM/100ML-% IV SOLN
INTRAVENOUS | Status: AC
  Filled 2018-08-22: qty 100

## 2018-08-22 MED ORDER — PROPOFOL 10 MG/ML IV BOLUS
INTRAVENOUS | Status: AC
  Filled 2018-08-22: qty 20

## 2018-08-22 MED ORDER — CEFAZOLIN SODIUM-DEXTROSE 2-4 GM/100ML-% IV SOLN: 2.0000 g | INTRAVENOUS | Status: DC

## 2018-08-22 MED ORDER — SODIUM CHLORIDE 0.9 % IV SOLN
INTRAVENOUS | Status: DC
  Administered 2018-08-22: 11:00:00 via INTRAVENOUS

## 2018-08-22 MED ORDER — LIDOCAINE HCL (PF) 2 % IJ SOLN
INTRAMUSCULAR | Status: AC
  Filled 2018-08-22: qty 10

## 2018-08-22 MED ORDER — VANCOMYCIN HCL 10 G IV SOLR
1500.0000 mg | INTRAVENOUS | Status: DC
  Filled 2018-08-22: qty 1500

## 2018-08-22 MED ORDER — FAMOTIDINE 20 MG PO TABS
20.0000 mg | ORAL_TABLET | Freq: Once | ORAL | Status: AC
  Administered 2018-08-22: 20 mg via ORAL

## 2018-08-22 MED ORDER — FAMOTIDINE 20 MG PO TABS
ORAL_TABLET | ORAL | Status: AC
  Filled 2018-08-22: qty 1

## 2018-08-22 MED ORDER — GLYCOPYRROLATE 0.2 MG/ML IJ SOLN
INTRAMUSCULAR | Status: AC
  Filled 2018-08-22: qty 1

## 2018-08-22 MED ORDER — DEXAMETHASONE SODIUM PHOSPHATE 10 MG/ML IJ SOLN
INTRAMUSCULAR | Status: AC
  Filled 2018-08-22: qty 1

## 2018-08-22 MED ORDER — FENTANYL CITRATE (PF) 100 MCG/2ML IJ SOLN
INTRAMUSCULAR | Status: AC
  Filled 2018-08-22: qty 2

## 2018-08-22 MED ORDER — ROCURONIUM BROMIDE 50 MG/5ML IV SOLN
INTRAVENOUS | Status: AC
  Filled 2018-08-22: qty 1

## 2018-08-22 MED ORDER — MIDAZOLAM HCL 2 MG/2ML IJ SOLN
INTRAMUSCULAR | Status: AC
  Filled 2018-08-22: qty 2

## 2018-08-22 SURGICAL SUPPLY — 71 items
ADH SKN CLS APL DERMABOND .7 (GAUZE/BANDAGES/DRESSINGS) ×1
AGENT HMST MTR 8 SURGIFLO (HEMOSTASIS) ×1
APL PRP STRL LF DISP 70% ISPRP (MISCELLANEOUS) ×2
BULB RESERV EVAC DRAIN JP 100C (MISCELLANEOUS) IMPLANT
BUR NEURO DRILL SOFT 3.0X3.8M (BURR) ×3 IMPLANT
CANISTER SUCT 1200ML W/VALVE (MISCELLANEOUS) ×6 IMPLANT
CHLORAPREP W/TINT 26 (MISCELLANEOUS) ×6 IMPLANT
CNTNR SPEC 2.5X3XGRAD LEK (MISCELLANEOUS) ×1
CONT SPEC 4OZ STER OR WHT (MISCELLANEOUS) ×1
CONT SPEC 4OZ STRL OR WHT (MISCELLANEOUS) ×1
CONTAINER SPEC 2.5X3XGRAD LEK (MISCELLANEOUS) ×2 IMPLANT
COUNTER NEEDLE 20/40 LG (NEEDLE) ×3 IMPLANT
COVER LIGHT HANDLE STERIS (MISCELLANEOUS) ×6 IMPLANT
COVER WAND RF STERILE (DRAPES) ×3 IMPLANT
CUP MEDICINE 2OZ PLAST GRAD ST (MISCELLANEOUS) ×6 IMPLANT
DERMABOND ADVANCED (GAUZE/BANDAGES/DRESSINGS) ×1
DERMABOND ADVANCED .7 DNX12 (GAUZE/BANDAGES/DRESSINGS) ×2 IMPLANT
DRAIN CHANNEL JP 10F RND 20C F (MISCELLANEOUS) IMPLANT
DRAPE C-ARM 42X72 X-RAY (DRAPES) ×6 IMPLANT
DRAPE C-ARMOR (DRAPES) ×3 IMPLANT
DRAPE INCISE IOBAN 66X45 STRL (DRAPES) ×3 IMPLANT
DRAPE LAPAROTOMY 100X77 ABD (DRAPES) ×3 IMPLANT
DRAPE MICROSCOPE SPINE 48X150 (DRAPES) ×3 IMPLANT
DRAPE POUCH INSTRU U-SHP 10X18 (DRAPES) ×3 IMPLANT
DRAPE SURG 17X11 SM STRL (DRAPES) ×12 IMPLANT
DRSG OPSITE POSTOP 4X8 (GAUZE/BANDAGES/DRESSINGS) IMPLANT
DRSG TEGADERM 4X4.75 (GAUZE/BANDAGES/DRESSINGS) IMPLANT
ELECT CAUTERY BLADE TIP 2.5 (TIP) ×2
ELECT EZSTD 165MM 6.5IN (MISCELLANEOUS)
ELECT REM PT RETURN 9FT ADLT (ELECTROSURGICAL) ×2
ELECTRODE CAUTERY BLDE TIP 2.5 (TIP) ×2 IMPLANT
ELECTRODE EZSTD 165MM 6.5IN (MISCELLANEOUS) IMPLANT
ELECTRODE REM PT RTRN 9FT ADLT (ELECTROSURGICAL) ×2 IMPLANT
FRAME EYE SHIELD (PROTECTIVE WEAR) ×6 IMPLANT
GAUZE XEROFORM 4X4 STRL (GAUZE/BANDAGES/DRESSINGS) IMPLANT
GLOVE BIO SURGEON STRL SZ 6.5 (GLOVE) ×6 IMPLANT
GLOVE BIOGEL PI IND STRL 7.0 (GLOVE) ×2 IMPLANT
GLOVE BIOGEL PI INDICATOR 7.0 (GLOVE) ×1
GLOVE SURG SYN 7.0 (GLOVE) ×2 IMPLANT
GLOVE SURG SYN 7.0 PF PI (GLOVE) ×1 IMPLANT
GLOVE SURG SYN 8.5  E (GLOVE) ×3
GLOVE SURG SYN 8.5 E (GLOVE) ×3 IMPLANT
GLOVE SURG SYN 8.5 PF PI (GLOVE) ×3 IMPLANT
GOWN SRG XL LVL 3 NONREINFORCE (GOWNS) ×2 IMPLANT
GOWN STRL NON-REIN TWL XL LVL3 (GOWNS) ×2
GOWN STRL REUS W/TWL MED LVL3 (GOWN DISPOSABLE) ×3 IMPLANT
GRADUATE 1200CC STRL 31836 (MISCELLANEOUS) ×3 IMPLANT
HEMOVAC 400CC 10FR (MISCELLANEOUS) IMPLANT
KIT SPINAL PRONEVIEW (KITS) ×3 IMPLANT
KNIFE BAYONET SHORT DISCETOMY (MISCELLANEOUS) IMPLANT
MARKER SKIN DUAL TIP RULER LAB (MISCELLANEOUS) ×6 IMPLANT
NDL SAFETY ECLIPSE 18X1.5 (NEEDLE) ×2 IMPLANT
NEEDLE HYPO 18GX1.5 SHARP (NEEDLE) ×2
NEEDLE HYPO 22GX1.5 SAFETY (NEEDLE) ×3 IMPLANT
NS IRRIG 1000ML POUR BTL (IV SOLUTION) ×3 IMPLANT
PACK LAMINECTOMY NEURO (CUSTOM PROCEDURE TRAY) ×3 IMPLANT
PAD ARMBOARD 7.5X6 YLW CONV (MISCELLANEOUS) ×3 IMPLANT
SPOGE SURGIFLO 8M (HEMOSTASIS) ×1
SPONGE DRAIN TRACH 4X4 STRL 2S (GAUZE/BANDAGES/DRESSINGS) IMPLANT
SPONGE SURGIFLO 8M (HEMOSTASIS) ×2 IMPLANT
STAPLER SKIN PROX 35W (STAPLE) IMPLANT
SUT DVC VLOC 3-0 CL 6 P-12 (SUTURE) ×3 IMPLANT
SUT VIC AB 0 CT1 27 (SUTURE) ×2
SUT VIC AB 0 CT1 27XCR 8 STRN (SUTURE) ×2 IMPLANT
SUT VIC AB 2-0 CT1 18 (SUTURE) ×3 IMPLANT
SYR 20CC LL (SYRINGE) ×3 IMPLANT
SYR 30ML LL (SYRINGE) ×6 IMPLANT
SYR 3ML LL SCALE MARK (SYRINGE) ×3 IMPLANT
TOWEL OR 17X26 4PK STRL BLUE (TOWEL DISPOSABLE) ×9 IMPLANT
TRAY FOLEY SLVR 16FR LF STAT (SET/KITS/TRAYS/PACK) IMPLANT
TUBING CONNECTING 10 (TUBING) ×3 IMPLANT

## 2018-08-22 NOTE — OR Nursing (Signed)
UDS positive,  procedure canceled per anesthesia, Dr Myer Haff in to see patient and discussed plan of care.

## 2018-08-22 NOTE — H&P (Signed)
History of Present Illness: 08/22/2018 I have reviewed the history below and confirmed the details.  She has quit smoking.  07/19/2018 Mr. Jaber Dunlow is here today with a chief complaint of low back pain, right leg weakness, right buttock and leg burning/numbness, burning in toes of right foot. She also reports burning sensations that shoot down her leg with coughing. She also reports frequent falls.  She has been having pain for well over a year. This is been worsening for the past year. She describes pain down her right leg in her buttock and posterior thigh into her anterolateral calf. There is numbness in her right calf. This is made worse by standing or walking. It is as bad as 8 or 9 out of 10.  She has seen another surgeon who recommended an L3-S1 fusion. Unfortunate, her diabetes control has not been great in the past, though she has made great strides recently. She is decreased smoking down to 4 cigarettes/day.  Bowel/Bladder Dysfunction: none  Conservative measures:  Physical therapy: has done exercises provided by orthopedics Multimodal medical therapy including regular antiinflammatories: gabapentin, diclofenac, norco, tramadol Injections: has tried epidural steroid injections  03/19/18: L5-S1 TFESI with minimal relief for a couple of days  Past Surgery: denies  Vann Okerlund has no symptoms of cervical myelopathy.  The symptoms are causing a significant impact on the patient's life.   Review of Systems:  A 10 point review of systems is negative, except for the pertinent positives and negatives detailed in the HPI.  Past Medical History: Past Medical History:  Diagnosis Date  . Anxiety  . Chronic low back pain  . Depression  . Diabetes mellitus without complication (CMS-HCC)  . HIV (human immunodeficiency virus infection) (CMS-HCC)  . Hx of degenerative disc disease  . Hypertension  . Ischemic stroke (CMS-HCC)  . MRSA carrier  . Perirectal abscess  .  Testicular mass  . Tobacco use disorder  . Transgender   Past Surgical History: Past Surgical History:  Procedure Laterality Date  . APPENDECTOMY   Allergies: Allergies as of 07/19/2018 - Reviewed 07/19/2018  Allergen Reaction Noted  . Propoxyphene n-acetaminophen Other (See Comments) 07/12/2018   Medications: Outpatient Encounter Medications as of 07/19/2018  Medication Sig Dispense Refill  . albuterol 90 mcg/actuation inhaler Inhale 2 inhalations into the lungs every 6 (six) hours as needed for Wheezing or Shortness of Breath  . aspirin-calcium carbonate 81 mg-300 mg calcium(777 mg) Tab Take 1 tablet by mouth once daily  . bictegravir-emtricitabine-tenofovir alafenamide (BIKTARVY) 50-200-25 mg tablet Take 1 tablet by mouth once daily  . blood glucose diagnostic (ACCU-CHEK AVIVA PLUS TEST STRP) test strip 3 (three) times a day  . blood glucose meter kit as directed  . escitalopram oxalate (LEXAPRO) 20 MG tablet Take 1 tablet by mouth nightly  . glimepiride (AMARYL) 4 MG tablet Take 1 tablet by mouth every morning before breakfast  . insulin GLARGINE (BASAGLAR KWIKPEN) pen injector (concentration 100 units/mL) Inject 40 Units subcutaneously once daily  . lancets (ACCU-CHEK SOFTCLIX LANCETS) 3 (three) times a day  . metFORMIN (GLUCOPHAGE) 1000 MG tablet Take 1 tablet by mouth 2 (two) times daily with meals  . pen needle, diabetic (INSULIN PEN NEEDLE MISC) Inject subcutaneously as directed  . spironolactone (ALDACTONE) 50 MG tablet Take 1 tablet by mouth once daily  . [DISCONTINUED] diclofenac (VOLTAREN) 50 MG EC tablet Take 1 tablet by mouth 3 (three) times a day  . [DISCONTINUED] gabapentin (NEURONTIN) 300 MG capsule Take 1 capsule (300  mg total) by mouth at bedtime for 4 days, THEN 1 capsule (300 mg total) 2 (two) times daily for 4 days, THEN 1 capsule (300 mg total) 3 (three) times daily for 4 days.  . [DISCONTINUED] HYDROcodone-acetaminophen (NORCO) 7.5-325 mg tablet Take 1-2 tablets  by mouth every 6 (six) hours as needed  . [DISCONTINUED] hydrOXYzine HCl (ATARAX) 10 MG tablet Take 1 tablet by mouth 2 (two) times daily  . [DISCONTINUED] traMADol (ULTRAM) 50 mg tablet Take 1 tablet by mouth every 6 (six) hours as needed  . [DISCONTINUED] varenicline (CHANTIX STARTING MONTH PAK) tablet as directed   No facility-administered encounter medications on file as of 07/19/2018.   Social History: Social History   Tobacco Use  . Smoking status: quit smoking Family Medical History: History reviewed. No pertinent family history.  Physical Examination: Vitals:   Vitals:   08/22/18 1115  Temp: 97.9 F (36.6 C)     General: Patient is well developed, well nourished, calm, collected, and in no apparent distress. Attention to examination is appropriate.  Psychiatric: Patient is non-anxious.  Head: Pupils equal, round, and reactive to light.  ENT: Oral mucosa appears well hydrated.  Neck: Supple. Full range of motion.  Respiratory: Patient is breathing without any difficulty.  Extremities: No edema.  Vascular: Palpable dorsal pedal pulses.  Skin: On exposed skin, there are no abnormal skin lesions.  NEUROLOGICAL:   Awake, alert, oriented to person, place, and time. Speech is clear and fluent. Fund of knowledge is appropriate.   Cranial Nerves: Pupils equal round and reactive to light. Facial tone is symmetric. Facial sensation is symmetric. Shoulder shrug is symmetric. Tongue protrusion is midline. There is no pronator drift.  ROM of spine: diminshed. Palpation of spine: non tender.   Strength: Side Biceps Triceps Deltoid Interossei Grip Wrist Ext. Wrist Flex.  R _0 L _1 Side Iliopsoas Quads Hamstring PF DF EHL  R _2 4+  L _3 Reflexes are 1+ and symmetric at the biceps, triceps, brachioradialis, patella and achilles. Hoffman's is absent. Clonus is not present. Toes are down-going.  Bilateral upper and lower  extremity sensation is intact to light touch except numbness R L5.  Gait is antalgic. Rapid alternating movements are normal.   Medical Decision Making  Imaging: MRI L spine 04/29/2018  IMPRESSION: 1. New large right paracentral disc extrusion at L4-L5 obliterating the right lateral recess with impingement of the descending right L5 nerve root. 2. Unchanged bilateral L5 pars defects with 14 mm anterolisthesis at L5-S1 and severe bilateral neuroforaminal stenosis impinging on the bilateral exiting L5 nerve roots. 3. New small central disc extrusion and annular fissure at L3-L4 with unchanged mild bilateral neuroforaminal stenosis.  Electronically Signed By: Titus Dubin M.D. On: 04/29/2018 09:21  I have personally reviewed the images and agree with the above interpretation.  Assessment and Plan: Mr. Hanssen is a pleasant 48 y.o. male with bilateral pars defects at L5 with 14 mm anterolisthesis (grade 2 spondylolisthesis) of L5 on S1. There is also a large disc herniation at L4-5 causing severe compression of the right L5 nerve root, causing lumbar radiculopathy. She has significant symptoms from this. She is tried injections and exercises without improvement.  Plan for L4-S1 TLIF.  Patient agrees with plan.  [name] MD, [facility] Department of Neurosurgery

## 2018-08-22 NOTE — Progress Notes (Signed)
Pharmacy Consult for Cefazolin and Vancomycin  48 yo M  Weight 111.6 kg Last Scr 0.62 on 08/09/2018  Will order Cefazolin 2 gram IV oncall to OR Will order Vancomycin 1500 mg IV oncall to OR  Bari Mantis PharmD Clinical Pharmacist 08/22/2018

## 2018-08-22 NOTE — Anesthesia Preprocedure Evaluation (Deleted)
Anesthesia Evaluation  Patient identified by MRN, date of birth, ID band Patient awake    Reviewed: Allergy & Precautions, H&P , NPO status , reviewed documented beta blocker date and time   Airway Mallampati: II  TM Distance: >3 FB Neck ROM: full    Dental  (+) Partial Upper, Chipped, Poor Dentition   Pulmonary asthma , pneumonia, former smoker,  Used inhaler today   Pulmonary exam normal        Cardiovascular hypertension, Normal cardiovascular exam     Neuro/Psych  Headaches, PSYCHIATRIC DISORDERS Anxiety Depression CVA    GI/Hepatic GERD  Medicated and Controlled,  Endo/Other  diabetes  Renal/GU      Musculoskeletal   Abdominal   Peds  Hematology   Anesthesia Other Findings Past Medical History: No date: Asthma No date: Depression No date: Diabetes mellitus No date: GERD (gastroesophageal reflux disease) No date: HIV disease (HCC) No date: Hypertension No date: MRSA carrier     Comment:  neck 03/30/2017: Perirectal abscess No date: Pneumonia 04/27/2015: Polyuria 04/27/2015: Poorly controlled diabetes mellitus (HCC) 06/20/2016: Testicular mass 04/27/2015: Transgender Past Surgical History: No date: APPENDECTOMY   Reproductive/Obstetrics                             Anesthesia Physical Anesthesia Plan  ASA: III  Anesthesia Plan: General   Post-op Pain Management:    Induction: Intravenous  PONV Risk Score and Plan: Ondansetron, Treatment may vary due to age or medical condition, Midazolam and Metaclopromide  Airway Management Planned: Oral ETT  Additional Equipment:   Intra-op Plan:   Post-operative Plan: Extubation in OR  Informed Consent: I have reviewed the patients History and Physical, chart, labs and discussed the procedure including the risks, benefits and alternatives for the proposed anesthesia with the patient or authorized representative who has indicated  his/her understanding and acceptance.     Dental Advisory Given  Plan Discussed with: CRNA  Anesthesia Plan Comments:         Anesthesia Quick Evaluation

## 2018-08-31 MED FILL — HYDROCODON-APAP 7.5-325: 7.5-325 | 30 days supply | Qty: 120 | Fill #0

## 2018-09-06 MED FILL — UNIFINE PENTIPS 8MM 31G: 31G X 8 MM | 90 days supply | Qty: 100 | Fill #1

## 2018-09-10 ENCOUNTER — Ambulatory Visit: Payer: Medicaid Other | Admitting: Infectious Diseases

## 2018-09-12 ENCOUNTER — Encounter: Payer: Self-pay | Admitting: Infectious Diseases

## 2018-09-12 ENCOUNTER — Ambulatory Visit (INDEPENDENT_AMBULATORY_CARE_PROVIDER_SITE_OTHER): Payer: Medicaid Other | Admitting: Infectious Diseases

## 2018-09-12 ENCOUNTER — Other Ambulatory Visit: Payer: Self-pay

## 2018-09-12 VITALS — BP 112/77 | HR 81 | Temp 97.7°F | Wt 251.0 lb

## 2018-09-12 DIAGNOSIS — L732 Hidradenitis suppurativa: Secondary | ICD-10-CM

## 2018-09-12 DIAGNOSIS — Z113 Encounter for screening for infections with a predominantly sexual mode of transmission: Secondary | ICD-10-CM

## 2018-09-12 DIAGNOSIS — B20 Human immunodeficiency virus [HIV] disease: Secondary | ICD-10-CM

## 2018-09-12 DIAGNOSIS — Z5181 Encounter for therapeutic drug level monitoring: Secondary | ICD-10-CM | POA: Diagnosis not present

## 2018-09-12 DIAGNOSIS — F17200 Nicotine dependence, unspecified, uncomplicated: Secondary | ICD-10-CM | POA: Diagnosis not present

## 2018-09-12 DIAGNOSIS — F640 Transsexualism: Secondary | ICD-10-CM

## 2018-09-12 DIAGNOSIS — Z716 Tobacco abuse counseling: Secondary | ICD-10-CM | POA: Insufficient documentation

## 2018-09-12 DIAGNOSIS — Z789 Other specified health status: Secondary | ICD-10-CM

## 2018-09-12 SURGERY — Surgical Case
Anesthesia: *Unknown

## 2018-09-12 MED ORDER — VARENICLINE TARTRATE 1 MG PO TABS: 1.0000 mg | ORAL_TABLET | Freq: Two times a day (BID) | ORAL | 0 refills | Status: DC

## 2018-09-12 MED ORDER — BICTEGRAVIR-EMTRICITAB-TENOFOV 50-200-25 MG PO TABS: 1.0000 | ORAL_TABLET | Freq: Every day | ORAL | 5 refills | Status: DC

## 2018-09-12 NOTE — Patient Instructions (Addendum)
It was a pleasure talking to you today [name].  Good luck with your surgery in May.   I have sent in refills for you Biktarvy.  Please continue taking this every day.  We will release to results on my chart so you can see them.  For your Chantix I want you to focus hard on kick in those last for cigarettes and stopping altogether.  This box is the last box from the recommended 12-week course.  Please continue taking 1 tablet twice a day with full glass of water.  Would like to see you back in 6 months with lab work.  If it is more convenient for you to do labs 1 day and if telephone visit we can certainly set this up.

## 2018-09-12 NOTE — Assessment & Plan Note (Signed)
Under good control on current dose of spironolactone.

## 2018-09-12 NOTE — Progress Notes (Signed)
Name: [name]  DOB: 01-27-1971 MRN: [id number] PCP: Clent Demark, PA-C    Patient Active Problem List   Diagnosis Date Noted  . Medication monitoring encounter 09/12/2018  . Ischemic stroke ([name]) 10/31/2017  . Visual field defect   . Complicated migraine   . Migraine with visual aura   . Essential hypertension 06/01/2016  . Diabetes mellitus type 2 in obese ([name]) 06/01/2016  . Poorly controlled diabetes mellitus ([name]) 04/27/2015  . Hearing loss secondary to cerumen impaction 02/07/2013  . Smoker 03/07/2011  . Asthma 02/07/2008  . Male-to-male transgender person 10/31/2007  . Depression with anxiety 10/17/2007  . Human immunodeficiency virus (HIV) disease ([name]) 04/14/2006  . HIDRADENITIS SUPPURATIVA 04/14/2006     Subjective:   Chief Complaint  Patient presents with  . HIV Positive/AIDS    follow up      HPI:  [name] is here today for follow-up for her HIV disease.  She has in the past been very adherent with her [facility] [employer] and reports that she has had no lapse in her medication, taking it as prescribed every day, no concerns with side effects or access to her medicine.  She was previously working with the research team.  She tells me she has been in good health overall since her last office visit together in 2018, planning to have lumbar spine fusion in the next month with Dr. Cari Caraway.  She is looking forward to this and improvement in her pain.  She is currently taking spironolactone 50 mg once daily for blood pressure control.  Also has benefit for testosterone suppression and controlling her hidradenitis.  She is proud of herself because she has reduced her cigarettes to 4 cigarettes/day with the assistance of Chantix.  She used her sister's starter pack and maintenance pack and is nearly done.  She is requesting final 4-week maintenance pack.  She denies any side effects pertaining to worsening depression or suicidality.  She only notices occasional nausea  which resolves when she takes a full glass of water with the medication.   Review of Systems  Constitutional: Negative for chills, fever, malaise/fatigue and weight loss.  HENT: Negative for sore throat.        No dental problems  Respiratory: Negative for cough and sputum production.   Cardiovascular: Negative for chest pain and leg swelling.  Gastrointestinal: Negative for abdominal pain, diarrhea and vomiting.  Genitourinary: Negative for dysuria and flank pain.  Musculoskeletal: Positive for back pain. Negative for joint pain, myalgias and neck pain.  Skin: Negative for rash.  Neurological: Negative for dizziness, tingling and headaches.  Psychiatric/Behavioral: Negative for depression and substance abuse. The patient is not nervous/anxious and does not have insomnia.     Past Medical History:  Diagnosis Date  . Asthma   . Depression   . Diabetes mellitus   . GERD (gastroesophageal reflux disease)   . HIV disease ([name])   . Hypertension   . 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 Perirectal abscess 03/30/2017  . Pneumonia   . Polyuria 04/27/2015  . Poorly controlled diabetes mellitus ([name]) 04/27/2015  . Testicular mass 06/20/2016  . Transgender 04/27/2015    Outpatient Medications Prior to Visit  Medication Sig Dispense Refill  . ACCU-CHEK SOFTCLIX LANCETS lancets 1 each by Other route 3 (three) times daily. 100 each 5  . albuterol (PROVENTIL HFA;VENTOLIN HFA) 108 (90 Base) MCG/ACT inhaler Inhale 2 puffs into the lungs every 6 (six) hours as needed for wheezing or shortness  of breath. 1 Inhaler 6  . blood glucose meter kit and supplies Dispense based on patient and insurance preference. Use up to four times daily as directed. (FOR ICD-10 E10.9, E11.9). 1 each 0  . escitalopram (LEXAPRO) 20 MG tablet TAKE 1 TABLET BY MOUTH AT BEDTIME (Patient taking differently: Take 20 mg by mouth at bedtime. ) 30 tablet 5  . glimepiride (AMARYL) 4 MG tablet Take 1 tablet (4 mg total) by mouth daily before  breakfast. 30 tablet 5  . HYDROcodone-acetaminophen (NORCO) 7.5-325 MG tablet Take 1-2 tablets by mouth every 6 (six) hours as needed for moderate pain. (Patient taking differently: Take 1 tablet by mouth every 4 (four) hours as needed for moderate pain. ) 50 tablet 0  . Insulin Glargine (LANTUS SOLOSTAR) 100 UNIT/ML Solostar Pen Inject 40 Units into the skin daily. (Patient taking differently: Inject 40 Units into the skin at bedtime. ) 4 pen 5  . Insulin Pen Needle (PEN NEEDLES) 31G X 8 MM MISC Inject 1 each into the skin daily. 100 each 1  . metFORMIN (GLUCOPHAGE) 1000 MG tablet Take 1 tablet (1,000 mg total) by mouth 2 (two) times daily with a meal. 180 tablet 1  . spironolactone (ALDACTONE) 50 MG tablet TAKE 1 TABLET BY MOUTH ONCE DAILY (Patient taking differently: Take 50 mg by mouth daily. ) 30 tablet 5  . BIKTARVY 50-200-25 MG TABS tablet TAKE 1 TABLET BY MOUTH DAILY. (Patient taking differently: Take 1 tablet by mouth daily. ) 30 tablet 0  . varenicline (CHANTIX STARTING MONTH PAK) 0.5 MG X 11 & 1 MG X 42 tablet Take one 0.5 mg tablet by mouth once daily for 3 days, then increase to one 0.5 mg tablet twice daily for 4 days, then increase to one 1 mg tablet twice daily. 53 tablet 0  . aspirin 81 MG tablet Take 1 tablet (81 mg total) by mouth daily. (Patient not taking: Reported on 08/02/2018) 30 tablet   . diclofenac (VOLTAREN) 50 MG EC tablet Take 1 tablet (50 mg total) by mouth 3 (three) times daily. (Patient not taking: Reported on 08/02/2018) 90 tablet 3   No facility-administered medications prior to visit.      Allergies  Allergen Reactions  . Propoxyphene N-Acetaminophen Other (See Comments)    Stomach cramps    Social History   Tobacco Use  . Smoking status: Current Every Day Smoker    Packs/day: 0.10    Years: 27.00    Pack years: 2.70    Types: Cigarettes    Last attempt to quit: 07/19/2018    Years since quitting: 0.1  . Smokeless tobacco: Never Used  . Tobacco comment:  using chantix, down to 3/day  Substance Use Topics  . Alcohol use: No    Alcohol/week: 0.0 standard drinks  . Drug use: Yes    Types: Marijuana    Comment: some daily    Family History  Problem Relation Age of Onset  . Heart failure Father   . Diabetes Father     Social History   Substance and Sexual Activity  Sexual Activity Yes  . Partners: Male   Comment: pt. given condoms     Objective:   Vitals:   09/12/18 0929  BP: 112/77  Pulse: 81  Temp: 97.7 F (36.5 C)  TempSrc: Oral  Weight: 251 lb (113.9 kg)   Body mass index is 35.01 kg/m.  Physical Exam Vitals signs reviewed.  Constitutional:      Appearance: She is well-developed.  Comments: Seated comfortably in chair during visit.   HENT:     Mouth/Throat:     Dentition: Normal dentition. No dental abscesses.  Cardiovascular:     Rate and Rhythm: Normal rate and regular rhythm.     Heart sounds: Normal heart sounds.  Pulmonary:     Effort: Pulmonary effort is normal.     Breath sounds: Normal breath sounds.  Abdominal:     General: There is no distension.     Palpations: Abdomen is soft.     Tenderness: There is no abdominal tenderness.  Lymphadenopathy:     Cervical: No cervical adenopathy.  Skin:    General: Skin is warm and dry.     Findings: No rash.  Neurological:     Mental Status: She is alert and oriented to person, place, and time.  Psychiatric:        Judgment: Judgment normal.     Comments: In good spirits today and engaged in care discussion.      Lab Results Lab Results  Component Value Date   WBC 14.9 (H) 08/09/2018   HGB 16.4 08/09/2018   HCT 46.5 08/09/2018   MCV 91.9 08/09/2018   PLT 344 08/09/2018    Lab Results  Component Value Date   CREATININE 0.62 08/09/2018   BUN 19 08/09/2018   NA 140 08/09/2018   K 3.8 08/09/2018   CL 103 08/09/2018   CO2 27 08/09/2018    Lab Results  Component Value Date   ALT 29 05/23/2018   AST 14 05/23/2018   ALKPHOS 97  05/23/2018   BILITOT 0.5 05/23/2018    Lab Results  Component Value Date   CHOL 144 02/22/2018   HDL 30 (L) 02/22/2018   LDLCALC 87 02/22/2018   TRIG 160 (H) 02/22/2018   CHOLHDL 4.8 02/22/2018   HIV 1 RNA Quant (copies/mL)  Date Value  07/26/2016 110 (H)  07/24/2013 24 (H)  02/07/2013 21   HIV-1 RNA Viral Load (no units)  Date Value  09/04/2017 76  03/27/2017 <40  04/26/2016 <40   CD4 (no units)  Date Value  04/26/2016 2,465  04/27/2015 2,213  09/02/2014 2,369     Assessment & Plan:   Problem List Items Addressed This Visit      Unprioritized   Human immunodeficiency virus (HIV) disease ([place]) - Primary    [name] is doing very well with Biktarvy.  We reviewed current and previous lab work.  She is due for repeat labs today.  We will refill her Biktarvy.  She will follow-up in 6 months with Dr. [name].       Relevant Medications   bictegravir-emtricitabine-tenofovir AF (BIKTARVY) 50-200-25 MG TABS tablet   Other Relevant Orders   HIV-1 RNA quant-no reflex-bld   T-helper cell (CD4)- ([facility] only)   Comprehensive metabolic panel   HIV-1 RNA quant-no reflex-bld   T-helper cell (CD4)- ([facility] only)   CBC with Differential/Platelet   COMPLETE METABOLIC PANEL WITH GFR   Male-to-male transgender person    Currently maintained on Spironolactone alone.  She is interested in estrogen replacement therapy however would like to wait until after she completely stop smoking to reduce risk of blood clot.  I told her that after she gets through her surgery we can talk about this again.  She is happy with current dose of spironolactone and desires no adjustment.      HIDRADENITIS SUPPURATIVA    Under good control on current dose of spironolactone.  Smoker    Will refill 4 weeks of maintenance pack x 1. I told her that she needs to stop smoking all together and the recommended duration of therapy is 12 weeks total. I congratulated her for her good work.        Medication monitoring encounter    She is taking metformin 1000 bid - there is interaction with Biktarvy that has potential to increase metformin serum levels. Will check lactic acid level today to assess potential interaction.       Relevant Orders   Lactic acid, plasma    Other Visit Diagnoses    Screening for STDs (sexually transmitted diseases)       Relevant Orders   RPR   RPR      Janene Madeira, MSN, NP-C [facility] for [facility] [place] Pager: [phone] Office: [phone]  09/12/18  5:24 PM

## 2018-09-12 NOTE — Assessment & Plan Note (Signed)
Will refill 4 weeks of maintenance pack x 1. I told her that she needs to stop smoking all together and the recommended duration of therapy is 12 weeks total. I congratulated her for her good work.

## 2018-09-12 NOTE — Assessment & Plan Note (Signed)
[name] is doing very well with Biktarvy.  We reviewed current and previous lab work.  She is due for repeat labs today.  We will refill her Biktarvy.  She will follow-up in 6 months with Dr. [name].

## 2018-09-12 NOTE — Assessment & Plan Note (Signed)
Currently maintained on Spironolactone alone.  She is interested in estrogen replacement therapy however would like to wait until after she completely stop smoking to reduce risk of blood clot.  I told her that after she gets through her surgery we can talk about this again.  She is happy with current dose of spironolactone and desires no adjustment.

## 2018-09-12 NOTE — Assessment & Plan Note (Signed)
She is taking metformin 1000 bid - there is interaction with Biktarvy that has potential to increase metformin serum levels. Will check lactic acid level today to assess potential interaction.

## 2018-09-13 LAB — T-HELPER CELL (CD4) - (RCID CLINIC ONLY)
CD4 % Helper T Cell: 49 % (ref 33–55)
CD4 T Cell Abs: 2210 /uL (ref 400–2700)

## 2018-09-14 MED FILL — GLIMEPIRIDE 4 MG TABLET: 4 | 30 days supply | Qty: 30 | Fill #3

## 2018-09-14 MED FILL — BIKTARVY 50-200-25 MG TABS: 50-200-25 | 30 days supply | Qty: 30 | Fill #0

## 2018-09-14 MED FILL — CHANTIX 1 MG CONT MONTH BOX: 1 | 28 days supply | Qty: 56 | Fill #0

## 2018-09-21 LAB — COMPREHENSIVE METABOLIC PANEL
AG Ratio: 1.7 (calc) (ref 1.0–2.5)
ALT: 26 U/L (ref 9–46)
AST: 15 U/L (ref 10–40)
Albumin: 4.3 g/dL (ref 3.6–5.1)
Alkaline phosphatase (APISO): 61 U/L (ref 36–130)
BUN: 17 mg/dL (ref 7–25)
CO2: 25 mmol/L (ref 20–32)
Calcium: 9.2 mg/dL (ref 8.6–10.3)
Chloride: 103 mmol/L (ref 98–110)
Creat: 0.67 mg/dL (ref 0.60–1.35)
Globulin: 2.6 g/dL (calc) (ref 1.9–3.7)
Glucose, Bld: 170 mg/dL — ABNORMAL HIGH (ref 65–99)
Potassium: 4.1 mmol/L (ref 3.5–5.3)
Sodium: 139 mmol/L (ref 135–146)
Total Bilirubin: 0.4 mg/dL (ref 0.2–1.2)
Total Protein: 6.9 g/dL (ref 6.1–8.1)

## 2018-09-21 LAB — HIV-1 RNA QUANT-NO REFLEX-BLD
HIV 1 RNA Quant: 67 copies/mL — ABNORMAL HIGH
HIV-1 RNA Quant, Log: 1.83 Log copies/mL — ABNORMAL HIGH

## 2018-09-21 LAB — RPR: RPR Ser Ql: NONREACTIVE

## 2018-09-21 LAB — LACTIC ACID, PLASMA: LACTIC ACID: 1.7 mmol/L (ref 0.4–1.8)

## 2018-09-27 MED FILL — LANTUS SOLOSTAR 100 UNITS/M: 100 | 30 days supply | Qty: 12 | Fill #3

## 2018-10-09 ENCOUNTER — Other Ambulatory Visit: Payer: Self-pay | Admitting: Infectious Diseases

## 2018-10-11 ENCOUNTER — Other Ambulatory Visit: Payer: Self-pay | Admitting: Behavioral Health

## 2018-10-11 DIAGNOSIS — F17200 Nicotine dependence, unspecified, uncomplicated: Secondary | ICD-10-CM

## 2018-10-11 MED ORDER — VARENICLINE TARTRATE 1 MG PO TABS: 1.0000 mg | ORAL_TABLET | Freq: Two times a day (BID) | ORAL | 0 refills | Status: DC

## 2018-10-11 MED FILL — GLIMEPIRIDE 4 MG TABLET: 4 | 30 days supply | Qty: 30 | Fill #4

## 2018-10-11 MED FILL — CELECOXIB 200 MG CAPSULE: 200 | 30 days supply | Qty: 60 | Fill #0

## 2018-10-11 MED FILL — BIKTARVY 50-200-25 MG TABS: 50-200-25 | 30 days supply | Qty: 30 | Fill #1

## 2018-10-11 MED FILL — CHANTIX 1 MG CONT MONTH BOX: 1 | 28 days supply | Qty: 56 | Fill #0

## 2018-10-15 MED FILL — OXYCODONE-ACETAMINOPHEN 5-3: 5-325 | 30 days supply | Qty: 180 | Fill #0

## 2018-10-25 MED FILL — OXYCODONE-ACETAMINOPHEN 5-3: 5-325 | 30 days supply | Qty: 180 | Fill #0

## 2018-11-12 ENCOUNTER — Other Ambulatory Visit: Payer: Self-pay | Admitting: Infectious Disease

## 2018-11-12 DIAGNOSIS — B20 Human immunodeficiency virus [HIV] disease: Secondary | ICD-10-CM

## 2018-11-12 MED FILL — LANTUS SOLOSTAR 100 UNITS/M: 100 | 30 days supply | Qty: 12 | Fill #4

## 2018-11-12 MED FILL — SPIRONOLACTONE 50 MG TABS: 50 | 30 days supply | Qty: 30 | Fill #0

## 2018-11-12 MED FILL — BIKTARVY 50-200-25 MG TABS: 50-200-25 | 30 days supply | Qty: 30 | Fill #2

## 2018-11-23 ENCOUNTER — Other Ambulatory Visit: Payer: Self-pay

## 2018-11-23 ENCOUNTER — Encounter
Admission: RE | Admit: 2018-11-23 | Discharge: 2018-11-23 | Disposition: A | Discharge status: Home / Self Care | Payer: Medicaid Other | Source: Ambulatory Visit | Attending: Neurosurgery | Admitting: Neurosurgery

## 2018-11-23 DIAGNOSIS — Z01812 Encounter for preprocedural laboratory examination: Secondary | ICD-10-CM | POA: Diagnosis not present

## 2018-11-23 DIAGNOSIS — Z1159 Encounter for screening for other viral diseases: Secondary | ICD-10-CM | POA: Insufficient documentation

## 2018-11-23 LAB — TYPE AND SCREEN
ABO/RH(D): O POS
Antibody Screen: NEGATIVE

## 2018-11-23 LAB — BASIC METABOLIC PANEL
Anion gap: 10 (ref 5–15)
BUN: 20 mg/dL (ref 6–20)
CO2: 23 mmol/L (ref 22–32)
Calcium: 9.1 mg/dL (ref 8.9–10.3)
Chloride: 105 mmol/L (ref 98–111)
Creatinine, Ser: 0.73 mg/dL (ref 0.61–1.24)
GFR calc Af Amer: >60 mL/min (ref >60)
GFR calc non Af Amer: >60 mL/min (ref >60)
Glucose, Bld: 297 mg/dL — ABNORMAL HIGH (ref 70–99)
Potassium: 4.2 mmol/L (ref 3.5–5.1)
Sodium: 138 mmol/L (ref 135–145)

## 2018-11-23 LAB — APTT: aPTT: 31 seconds (ref 24–36)

## 2018-11-23 LAB — URINALYSIS, COMPLETE (UACMP) WITH MICROSCOPIC
Bacteria, UA: NONE SEEN
Bilirubin Urine: NEGATIVE
Glucose, UA: >=500 mg/dL — AB
Hgb urine dipstick: NEGATIVE
Ketones, ur: NEGATIVE mg/dL
Leukocytes,Ua: NEGATIVE
Nitrite: NEGATIVE
Protein, ur: 30 mg/dL — AB
Specific Gravity, Urine: 1.029 (ref 1.005–1.030)
pH: 5 (ref 5.0–8.0)

## 2018-11-23 LAB — HEMOGLOBIN A1C
Hgb A1c MFr Bld: 8.1 % — ABNORMAL HIGH (ref 4.8–5.6)
Mean Plasma Glucose: 185.77 mg/dL

## 2018-11-23 LAB — CBC
HCT: 46.6 % (ref 39.0–52.0)
Hemoglobin: 16.6 g/dL (ref 13.0–17.0)
MCH: 33.3 pg (ref 26.0–34.0)
MCHC: 35.6 g/dL (ref 30.0–36.0)
MCV: 93.4 fL (ref 80.0–100.0)
Platelets: 343 10*3/uL (ref 150–400)
RBC: 4.99 MIL/uL (ref 4.22–5.81)
RDW: 11.8 % (ref 11.5–15.5)
WBC: 15.3 10*3/uL — ABNORMAL HIGH (ref 4.0–10.5)
nRBC: 0 % (ref 0.0–0.2)

## 2018-11-23 LAB — PROTIME-INR
INR: 1 (ref 0.8–1.2)
Prothrombin Time: 13.1 seconds (ref 11.4–15.2)

## 2018-11-24 LAB — NOVEL CORONAVIRUS, NAA (HOSP ORDER, SEND-OUT TO REF LAB; TAT 18-24 HRS): SARS-CoV-2, NAA: NOT DETECTED

## 2018-11-26 LAB — SURGICAL PCR SCREEN
MRSA, PCR: POSITIVE — AB
Staphylococcus aureus: POSITIVE — AB

## 2018-12-04 ENCOUNTER — Other Ambulatory Visit: Payer: Self-pay | Admitting: Infectious Disease

## 2018-12-04 DIAGNOSIS — F321 Major depressive disorder, single episode, moderate: Secondary | ICD-10-CM

## 2018-12-04 MED FILL — ESCITALOPRAM 20 MG TABLET: 20 | 30 days supply | Qty: 30 | Fill #0

## 2018-12-05 MED FILL — SPIRONOLACTONE 50 MG TABS: 50 | 30 days supply | Qty: 30 | Fill #1

## 2018-12-05 MED FILL — GLIMEPIRIDE 4 MG TABLET: 4 | 30 days supply | Qty: 30 | Fill #5

## 2018-12-05 MED FILL — BIKTARVY 50-200-25 MG TABS: 50-200-25 | 30 days supply | Qty: 30 | Fill #3

## 2018-12-05 MED FILL — LANTUS SOLOSTAR 100 UNITS/M: 100 | 30 days supply | Qty: 12 | Fill #5

## 2018-12-27 MED FILL — HYDROCODON-APAP 7.5-325: 7.5-325 | 30 days supply | Qty: 90 | Fill #0

## 2019-01-02 MED FILL — ESCITALOPRAM 20 MG TABLET: 20 | 30 days supply | Qty: 30 | Fill #1

## 2019-01-02 MED FILL — BIKTARVY 50-200-25 MG TABS: 50-200-25 | 30 days supply | Qty: 30 | Fill #4

## 2019-01-02 MED FILL — SPIRONOLACTONE 50 MG TABS: 50 | 30 days supply | Qty: 30 | Fill #2

## 2019-01-03 ENCOUNTER — Telehealth (INDEPENDENT_AMBULATORY_CARE_PROVIDER_SITE_OTHER): Payer: Medicaid Other | Admitting: Primary Care

## 2019-01-03 ENCOUNTER — Encounter (INDEPENDENT_AMBULATORY_CARE_PROVIDER_SITE_OTHER): Payer: Self-pay | Admitting: Primary Care

## 2019-01-03 DIAGNOSIS — R5383 Other fatigue: Secondary | ICD-10-CM | POA: Diagnosis not present

## 2019-01-03 DIAGNOSIS — Z794 Long term (current) use of insulin: Secondary | ICD-10-CM

## 2019-01-03 DIAGNOSIS — R5381 Other malaise: Secondary | ICD-10-CM

## 2019-01-03 DIAGNOSIS — F418 Other specified anxiety disorders: Secondary | ICD-10-CM | POA: Diagnosis not present

## 2019-01-03 DIAGNOSIS — Z01818 Encounter for other preprocedural examination: Secondary | ICD-10-CM

## 2019-01-03 DIAGNOSIS — E119 Type 2 diabetes mellitus without complications: Secondary | ICD-10-CM

## 2019-01-03 DIAGNOSIS — J4520 Mild intermittent asthma, uncomplicated: Secondary | ICD-10-CM

## 2019-01-03 DIAGNOSIS — Z760 Encounter for issue of repeat prescription: Secondary | ICD-10-CM | POA: Diagnosis not present

## 2019-01-03 MED ORDER — GLIMEPIRIDE 4 MG PO TABS: 4.0000 mg | ORAL_TABLET | Freq: Every day | ORAL | 0 refills | Status: DC

## 2019-01-03 MED ORDER — PEN NEEDLES 31G X 8 MM MISC: 1.0000 | Freq: Every day | 1 refills | Status: DC

## 2019-01-03 MED ORDER — METFORMIN HCL 1000 MG PO TABS: 1000.0000 mg | ORAL_TABLET | Freq: Two times a day (BID) | ORAL | 0 refills | Status: DC

## 2019-01-03 MED ORDER — LANTUS SOLOSTAR 100 UNIT/ML ~~LOC~~ SOPN: 40.0000 [IU] | PEN_INJECTOR | Freq: Every day | SUBCUTANEOUS | 3 refills | Status: DC

## 2019-01-03 MED ORDER — ALBUTEROL SULFATE HFA 108 (90 BASE) MCG/ACT IN AERS: 2.0000 | INHALATION_SPRAY | Freq: Four times a day (QID) | RESPIRATORY_TRACT | 1 refills | Status: DC | PRN

## 2019-01-03 MED ORDER — MELATONIN 5 MG PO CAPS: 5.0000 mg | ORAL_CAPSULE | Freq: Every evening | ORAL | 3 refills | Status: DC | PRN

## 2019-01-03 MED FILL — UNIFINE PENTIPS 8MM 31G: 31G X 8 MM | 100 days supply | Qty: 100 | Fill #0

## 2019-01-03 MED FILL — metFORMIN HCL 1000 MG TABS: 1000 | 90 days supply | Qty: 180 | Fill #0

## 2019-01-03 MED FILL — ALBUTEROL SULFATE HFA 108 (: 108 (90 BAS | 30 days supply | Qty: 18 | Fill #0

## 2019-01-03 MED FILL — GLIMEPIRIDE 4 MG TABS: 4 | 90 days supply | Qty: 90 | Fill #0

## 2019-01-03 MED FILL — LANTUS SOLOSTAR 100 UNITS/M: 100 | 37 days supply | Qty: 15 | Fill #0

## 2019-01-03 NOTE — Progress Notes (Addendum)
Virtual Visit via Telephone Note  I connected with [name] on 01/03/19 at  9:50 AM EDT by telephone and verified that I am speaking with the correct person using two identifiers.   I discussed the limitations, risks, security and privacy concerns of performing an evaluation and management service by telephone and the availability of in person appointments. I also discussed with the patient that there may be a patient responsible charge related to this service. The patient expressed understanding and agreed to proceed.   History of Present Illness: [name] is having a visit for management of diabetes, (A1C) 7.1 today one month ago 8.1and management of depression and anxiety .    Observations/Objective: Review of Systems  Constitutional: Positive for weight loss.       Intentional   Eyes: Positive for blurred vision.  Musculoskeletal: Positive for back pain.  Psychiatric/Behavioral: Positive for depression. The patient has insomnia.    Assessment and Plan: .Diagnoses and all orders for this visit:  Depression with anxiety Depression is when you feel down, blue or sad for at least 2 weeks in a row. You may experience increaset increase in sleeping, eating or  loss of interest in doing things that once gave you pleasure. Feeling worthless, guilty, nervous and low self esteem, avoiding interaction with other people or increase agitation. Continue on Lexapro.  Pre-operative clearance On 11/23/2018 surgical pcr screen positive for MRSA and Staphylococcus. WBC 115.3, negative for COVID Postpone due to A1C > 7.  Type 2 diabetes mellitus without complication, with long-term current use of insulin (HCC) ADA recommends the following therapeutic goals for glycemic control related to A1c measurements: Goal of therapy: Less than 6.5 hemoglobin A1c.  Reference clinical practice recommendations. Foods that are high in carbohydrates are the following rice, potatoes, breads, sugars, and  pastas.  Reduction in the intake (eating) will assist in lowering your blood sugars. -     Insulin Glargine (LANTUS SOLOSTAR) 100 UNIT/ML Solostar Pen; Inject 40 Units into the skin at bedtime. -     glimepiride (AMARYL) 4 MG tablet; Take 1 tablet (4 mg total) by mouth daily before breakfast. -     Insulin Pen Needle (PEN NEEDLES) 31G X 8 MM MISC; Inject 1 each into the skin daily.  Malaise and fatigue  This is symptomatic of various aliments. That can include tired and lack of interest in things that give you pleasure. Overall feeling of discomfort. CBC wnl  Medication refill Current Outpatient Medications on File Prior to Visit  Medication Sig Dispense Refill  . ACCU-CHEK SOFTCLIX LANCETS lancets 1 each by Other route 3 (three) times daily. 100 each 5  . aspirin 81 MG tablet Take 1 tablet (81 mg total) by mouth daily. (Patient not taking: Reported on 08/02/2018) 30 tablet       5  . blood glucose meter kit and supplies Dispense based on patient and insurance preference. Use up to four times daily as directed. (FOR ICD-10 E10.9, E11.9). 1 each 0  . celecoxib (CELEBREX) 200 MG capsule Take 200 mg by mouth 2 (two) times daily.    Marland Kitchen escitalopram (LEXAPRO) 20 MG tablet TAKE 1 TABLET BY MOUTH AT BEDTIME 30 tablet 5      0   Mild intermittent asthma without complication Asthma is recognize by classic presentation of symptoms which include cough, wheeze, and shortness of breath brought on by characteristic triggers and relieved by bronchodilating medications.  This is caused by from hyper responsiveness with tendency of  airways to narrow excessively in response to a stimuli.  Asthma Asthma is recognize by classic presentation of symptoms which include cough, wheeze, and shortness of breath brought on by characteristic triggers and relieved by bronchodilating medications.  This is caused by from hyper responsiveness with tendency of airways to narrow excessively in response to a stimuli. albuterol  (VENTOLIN HFA) 108 (90 Base) MCG/ACT inhaler; Inhale 2 puffs into the lungs every 6 (six) hours as needed for wheezing or shortness of breath  Other orders -     metFORMIN (GLUCOPHAGE) 1000 MG tablet; Take 1 tablet (1,000 mg total) by mouth 2 (two) times daily with a meal.  Follow Up Instructions:    I discussed the assessment and treatment plan with the patient. The patient was provided an opportunity to ask questions and all were answered. The patient agreed with the plan and demonstrated an understanding of the instructions.   The patient was advised to call back or seek an in-person evaluation if the symptoms worsen or if the condition fails to improve as anticipated.  I provided 24 minutes of non-face-to-face time during this encounter.   [name], NP

## 2019-01-04 ENCOUNTER — Other Ambulatory Visit: Payer: Self-pay

## 2019-01-04 ENCOUNTER — Encounter
Admission: RE | Admit: 2019-01-04 | Discharge: 2019-01-04 | Disposition: A | Discharge status: Home / Self Care | Payer: Medicaid Other | Source: Ambulatory Visit | Attending: Neurosurgery | Admitting: Neurosurgery

## 2019-01-04 DIAGNOSIS — Z1159 Encounter for screening for other viral diseases: Secondary | ICD-10-CM | POA: Insufficient documentation

## 2019-01-04 DIAGNOSIS — Z01812 Encounter for preprocedural laboratory examination: Secondary | ICD-10-CM | POA: Insufficient documentation

## 2019-01-04 LAB — TYPE AND SCREEN
ABO/RH(D): O POS
Antibody Screen: NEGATIVE

## 2019-01-04 LAB — HEMOGLOBIN A1C
Hgb A1c MFr Bld: 7.1 % — ABNORMAL HIGH (ref 4.8–5.6)
Mean Plasma Glucose: 157.07 mg/dL

## 2019-01-05 LAB — SARS CORONAVIRUS 2 (TAT 6-24 HRS): SARS Coronavirus 2: NEGATIVE

## 2019-01-09 ENCOUNTER — Encounter
Admission: RE | Disposition: A | Discharge status: Home / Self Care | Payer: Self-pay | Source: Home / Self Care | Attending: Neurosurgery

## 2019-01-09 ENCOUNTER — Ambulatory Visit: Payer: Medicaid Other | Admitting: Anesthesiology

## 2019-01-09 ENCOUNTER — Encounter: Payer: Self-pay | Admitting: *Deleted

## 2019-01-09 ENCOUNTER — Inpatient Hospital Stay
Admission: RE | Admit: 2019-01-09 | Discharge: 2019-01-11 | DRG: 454 | Disposition: A | Discharge status: Home / Self Care | Payer: Medicaid Other | Attending: Neurosurgery | Admitting: Neurosurgery

## 2019-01-09 ENCOUNTER — Ambulatory Visit: Payer: Medicaid Other

## 2019-01-09 ENCOUNTER — Other Ambulatory Visit: Payer: Self-pay

## 2019-01-09 DIAGNOSIS — M4326 Fusion of spine, lumbar region: Secondary | ICD-10-CM | POA: Diagnosis not present

## 2019-01-09 DIAGNOSIS — F419 Anxiety disorder, unspecified: Secondary | ICD-10-CM | POA: Diagnosis not present

## 2019-01-09 DIAGNOSIS — F17210 Nicotine dependence, cigarettes, uncomplicated: Secondary | ICD-10-CM | POA: Diagnosis not present

## 2019-01-09 DIAGNOSIS — Z22322 Carrier or suspected carrier of Methicillin resistant Staphylococcus aureus: Secondary | ICD-10-CM

## 2019-01-09 DIAGNOSIS — N509 Disorder of male genital organs, unspecified: Secondary | ICD-10-CM | POA: Diagnosis not present

## 2019-01-09 DIAGNOSIS — M4317 Spondylolisthesis, lumbosacral region: Secondary | ICD-10-CM | POA: Diagnosis present

## 2019-01-09 DIAGNOSIS — F329 Major depressive disorder, single episode, unspecified: Secondary | ICD-10-CM | POA: Diagnosis present

## 2019-01-09 DIAGNOSIS — M4316 Spondylolisthesis, lumbar region: Secondary | ICD-10-CM | POA: Diagnosis not present

## 2019-01-09 DIAGNOSIS — K611 Rectal abscess: Secondary | ICD-10-CM | POA: Diagnosis not present

## 2019-01-09 DIAGNOSIS — Z79891 Long term (current) use of opiate analgesic: Secondary | ICD-10-CM | POA: Diagnosis not present

## 2019-01-09 DIAGNOSIS — Z419 Encounter for procedure for purposes other than remedying health state, unspecified: Secondary | ICD-10-CM

## 2019-01-09 DIAGNOSIS — Z21 Asymptomatic human immunodeficiency virus [HIV] infection status: Secondary | ICD-10-CM | POA: Diagnosis present

## 2019-01-09 DIAGNOSIS — Z79899 Other long term (current) drug therapy: Secondary | ICD-10-CM | POA: Diagnosis not present

## 2019-01-09 DIAGNOSIS — G8929 Other chronic pain: Secondary | ICD-10-CM | POA: Diagnosis present

## 2019-01-09 DIAGNOSIS — M5126 Other intervertebral disc displacement, lumbar region: Secondary | ICD-10-CM | POA: Diagnosis present

## 2019-01-09 DIAGNOSIS — M48061 Spinal stenosis, lumbar region without neurogenic claudication: Secondary | ICD-10-CM | POA: Diagnosis present

## 2019-01-09 DIAGNOSIS — Z794 Long term (current) use of insulin: Secondary | ICD-10-CM | POA: Diagnosis not present

## 2019-01-09 DIAGNOSIS — M5416 Radiculopathy, lumbar region: Secondary | ICD-10-CM | POA: Diagnosis not present

## 2019-01-09 DIAGNOSIS — K219 Gastro-esophageal reflux disease without esophagitis: Secondary | ICD-10-CM | POA: Diagnosis not present

## 2019-01-09 DIAGNOSIS — M5116 Intervertebral disc disorders with radiculopathy, lumbar region: Secondary | ICD-10-CM | POA: Diagnosis not present

## 2019-01-09 DIAGNOSIS — Z981 Arthrodesis status: Secondary | ICD-10-CM | POA: Diagnosis not present

## 2019-01-09 DIAGNOSIS — E119 Type 2 diabetes mellitus without complications: Secondary | ICD-10-CM | POA: Diagnosis not present

## 2019-01-09 DIAGNOSIS — M4310 Spondylolisthesis, site unspecified: Secondary | ICD-10-CM | POA: Diagnosis not present

## 2019-01-09 DIAGNOSIS — Z8673 Personal history of transient ischemic attack (TIA), and cerebral infarction without residual deficits: Secondary | ICD-10-CM | POA: Diagnosis not present

## 2019-01-09 DIAGNOSIS — I10 Essential (primary) hypertension: Secondary | ICD-10-CM | POA: Diagnosis present

## 2019-01-09 DIAGNOSIS — M4306 Spondylolysis, lumbar region: Secondary | ICD-10-CM | POA: Diagnosis not present

## 2019-01-09 DIAGNOSIS — F649 Gender identity disorder, unspecified: Secondary | ICD-10-CM | POA: Diagnosis present

## 2019-01-09 DIAGNOSIS — F418 Other specified anxiety disorders: Secondary | ICD-10-CM | POA: Diagnosis not present

## 2019-01-09 HISTORY — PX: TRANSFORAMINAL LUMBAR INTERBODY FUSION (TLIF) WITH PEDICLE SCREW FIXATION 3 LEVEL: SHX6143

## 2019-01-09 LAB — URINE DRUG SCREEN, QUALITATIVE (ARMC ONLY)
Amphetamines, Ur Screen: NOT DETECTED
Barbiturates, Ur Screen: NOT DETECTED
Benzodiazepine, Ur Scrn: NOT DETECTED
Cannabinoid 50 Ng, Ur ~~LOC~~: POSITIVE — AB
Cocaine Metabolite,Ur ~~LOC~~: NOT DETECTED
MDMA (Ecstasy)Ur Screen: NOT DETECTED
Methadone Scn, Ur: NOT DETECTED
Opiate, Ur Screen: POSITIVE — AB
Phencyclidine (PCP) Ur S: NOT DETECTED
Tricyclic, Ur Screen: NOT DETECTED

## 2019-01-09 LAB — ABO/RH: ABO/RH(D): O POS

## 2019-01-09 LAB — GLUCOSE, CAPILLARY
Glucose-Capillary: 219 mg/dL — ABNORMAL HIGH (ref 70–99)
Glucose-Capillary: 261 mg/dL — ABNORMAL HIGH (ref 70–99)
Glucose-Capillary: 229 mg/dL — ABNORMAL HIGH (ref 70–99)
Glucose-Capillary: 201 mg/dL — ABNORMAL HIGH (ref 70–99)

## 2019-01-09 SURGERY — TRANSFORAMINAL LUMBAR INTERBODY FUSION (TLIF) WITH PEDICLE SCREW FIXATION 3 LEVEL
Anesthesia: General | Site: Back

## 2019-01-09 MED ORDER — SODIUM CHLORIDE 0.9 % IV SOLN
INTRAVENOUS | Status: DC
  Administered 2019-01-09 (×2): via INTRAVENOUS

## 2019-01-09 MED ORDER — GLYCOPYRROLATE 0.2 MG/ML IJ SOLN
INTRAMUSCULAR | Status: DC | PRN
  Administered 2019-01-09: 0.2 mg via INTRAVENOUS

## 2019-01-09 MED ORDER — FAMOTIDINE 20 MG PO TABS
20.0000 mg | ORAL_TABLET | Freq: Once | ORAL | Status: AC
  Administered 2019-01-09: 20 mg via ORAL

## 2019-01-09 MED ORDER — SODIUM CHLORIDE 0.9% FLUSH
3.0000 mL | Freq: Two times a day (BID) | INTRAVENOUS | Status: DC
  Administered 2019-01-09 – 2019-01-11 (×3): 3 mL via INTRAVENOUS

## 2019-01-09 MED ORDER — SODIUM CHLORIDE 0.9 % IR SOLN
Status: DC | PRN
  Administered 2019-01-09: 500 mL

## 2019-01-09 MED ORDER — ALBUTEROL SULFATE HFA 108 (90 BASE) MCG/ACT IN AERS
INHALATION_SPRAY | RESPIRATORY_TRACT | Status: DC | PRN
  Administered 2019-01-09 (×2): 6 via RESPIRATORY_TRACT

## 2019-01-09 MED ORDER — HYDROMORPHONE HCL 1 MG/ML IJ SOLN
INTRAMUSCULAR | Status: DC | PRN
  Administered 2019-01-09 (×2): 1 mg via INTRAVENOUS

## 2019-01-09 MED ORDER — MAGNESIUM CITRATE PO SOLN
1.0000 | Freq: Once | ORAL | Status: DC | PRN
  Filled 2019-01-09: qty 296

## 2019-01-09 MED ORDER — INSULIN ASPART 100 UNIT/ML ~~LOC~~ SOLN
5.0000 [IU] | Freq: Once | SUBCUTANEOUS | Status: AC
  Administered 2019-01-09: 07:00:00 5 [IU] via SUBCUTANEOUS

## 2019-01-09 MED ORDER — GLYCOPYRROLATE 0.2 MG/ML IJ SOLN
INTRAMUSCULAR | Status: AC
  Filled 2019-01-09: qty 1

## 2019-01-09 MED ORDER — BACITRACIN 50000 UNITS IM SOLR
INTRAMUSCULAR | Status: AC
  Filled 2019-01-09: qty 1

## 2019-01-09 MED ORDER — ROCURONIUM BROMIDE 100 MG/10ML IV SOLN
INTRAVENOUS | Status: DC | PRN
  Administered 2019-01-09: 10 mg via INTRAVENOUS
  Administered 2019-01-09: 20 mg via INTRAVENOUS
  Administered 2019-01-09 (×3): 10 mg via INTRAVENOUS
  Administered 2019-01-09: 40 mg via INTRAVENOUS
  Administered 2019-01-09 (×2): 20 mg via INTRAVENOUS

## 2019-01-09 MED ORDER — INSULIN GLARGINE 100 UNIT/ML ~~LOC~~ SOLN
40.0000 [IU] | Freq: Every day | SUBCUTANEOUS | Status: DC
  Administered 2019-01-09 – 2019-01-10 (×2): 40 [IU] via SUBCUTANEOUS
  Filled 2019-01-09 (×3): qty 0.4

## 2019-01-09 MED ORDER — KETOROLAC TROMETHAMINE 30 MG/ML IJ SOLN
30.0000 mg | Freq: Once | INTRAMUSCULAR | Status: AC
  Administered 2019-01-09: 30 mg via INTRAVENOUS
  Filled 2019-01-09 (×2): qty 1

## 2019-01-09 MED ORDER — SODIUM CHLORIDE 0.9% FLUSH: 3.0000 mL | INTRAVENOUS | Status: DC | PRN

## 2019-01-09 MED ORDER — ROCURONIUM BROMIDE 50 MG/5ML IV SOLN
INTRAVENOUS | Status: AC
  Filled 2019-01-09: qty 2

## 2019-01-09 MED ORDER — SENNA 8.6 MG PO TABS
1.0000 | ORAL_TABLET | Freq: Two times a day (BID) | ORAL | Status: DC
  Administered 2019-01-09 – 2019-01-11 (×4): 8.6 mg via ORAL
  Filled 2019-01-09 (×4): qty 1

## 2019-01-09 MED ORDER — KETAMINE HCL 50 MG/ML IJ SOLN
INTRAMUSCULAR | Status: AC
  Filled 2019-01-09: qty 10

## 2019-01-09 MED ORDER — PHENOL 1.4 % MT LIQD
1.0000 | OROMUCOSAL | Status: DC | PRN
  Filled 2019-01-09: qty 177

## 2019-01-09 MED ORDER — METFORMIN HCL 500 MG PO TABS
1000.0000 mg | ORAL_TABLET | Freq: Two times a day (BID) | ORAL | Status: DC
  Administered 2019-01-09 – 2019-01-11 (×4): 1000 mg via ORAL
  Filled 2019-01-09 (×4): qty 2

## 2019-01-09 MED ORDER — ALBUTEROL SULFATE (2.5 MG/3ML) 0.083% IN NEBU: 3.0000 mL | INHALATION_SOLUTION | Freq: Four times a day (QID) | RESPIRATORY_TRACT | Status: DC | PRN

## 2019-01-09 MED ORDER — ROCURONIUM BROMIDE 50 MG/5ML IV SOLN
INTRAVENOUS | Status: AC
  Filled 2019-01-09: qty 1

## 2019-01-09 MED ORDER — CEFAZOLIN SODIUM-DEXTROSE 2-4 GM/100ML-% IV SOLN
INTRAVENOUS | Status: AC
  Filled 2019-01-09: qty 100

## 2019-01-09 MED ORDER — INSULIN GLARGINE 100 UNIT/ML SOLOSTAR PEN: 40.0000 [IU] | PEN_INJECTOR | Freq: Every day | SUBCUTANEOUS | Status: DC

## 2019-01-09 MED ORDER — PROPOFOL 10 MG/ML IV BOLUS
INTRAVENOUS | Status: DC | PRN
  Administered 2019-01-09: 160 mg via INTRAVENOUS

## 2019-01-09 MED ORDER — GLIMEPIRIDE 4 MG PO TABS
4.0000 mg | ORAL_TABLET | Freq: Every day | ORAL | Status: DC
  Administered 2019-01-10 – 2019-01-11 (×2): 4 mg via ORAL
  Filled 2019-01-09 (×2): qty 1

## 2019-01-09 MED ORDER — VANCOMYCIN HCL 10 G IV SOLR
1500.0000 mg | INTRAVENOUS | Status: DC
  Filled 2019-01-09: qty 1500

## 2019-01-09 MED ORDER — BUPIVACAINE HCL (PF) 0.5 % IJ SOLN
INTRAMUSCULAR | Status: AC
  Filled 2019-01-09: qty 30

## 2019-01-09 MED ORDER — VARENICLINE TARTRATE 1 MG PO TABS
1.0000 mg | ORAL_TABLET | Freq: Two times a day (BID) | ORAL | Status: DC
  Administered 2019-01-09 – 2019-01-11 (×4): 1 mg via ORAL
  Filled 2019-01-09 (×5): qty 1

## 2019-01-09 MED ORDER — ONDANSETRON HCL 4 MG/2ML IJ SOLN: 4.0000 mg | Freq: Four times a day (QID) | INTRAMUSCULAR | Status: DC | PRN

## 2019-01-09 MED ORDER — ALBUTEROL SULFATE (2.5 MG/3ML) 0.083% IN NEBU
3.0000 mL | INHALATION_SOLUTION | Freq: Once | RESPIRATORY_TRACT | Status: DC
  Filled 2019-01-09: qty 3

## 2019-01-09 MED ORDER — FENTANYL CITRATE (PF) 100 MCG/2ML IJ SOLN
25.0000 ug | INTRAMUSCULAR | Status: DC | PRN
  Administered 2019-01-09 (×2): 25 ug via INTRAVENOUS

## 2019-01-09 MED ORDER — MIDAZOLAM HCL 2 MG/2ML IJ SOLN
INTRAMUSCULAR | Status: DC | PRN
  Administered 2019-01-09: 2 mg via INTRAVENOUS

## 2019-01-09 MED ORDER — HYDROMORPHONE HCL 1 MG/ML IJ SOLN
INTRAMUSCULAR | Status: AC
  Filled 2019-01-09: qty 1

## 2019-01-09 MED ORDER — BUPIVACAINE HCL (PF) 0.5 % IJ SOLN
INTRAMUSCULAR | Status: DC | PRN
  Administered 2019-01-09: 20 mL

## 2019-01-09 MED ORDER — FENTANYL CITRATE (PF) 250 MCG/5ML IJ SOLN
INTRAMUSCULAR | Status: AC
  Filled 2019-01-09: qty 5

## 2019-01-09 MED ORDER — THROMBIN 5000 UNITS EX SOLR
CUTANEOUS | Status: AC
  Filled 2019-01-09: qty 5000

## 2019-01-09 MED ORDER — PROPOFOL 10 MG/ML IV BOLUS
INTRAVENOUS | Status: AC
  Filled 2019-01-09: qty 20

## 2019-01-09 MED ORDER — MIDAZOLAM HCL 2 MG/2ML IJ SOLN
INTRAMUSCULAR | Status: AC
  Filled 2019-01-09: qty 2

## 2019-01-09 MED ORDER — SUGAMMADEX SODIUM 200 MG/2ML IV SOLN
INTRAVENOUS | Status: AC
  Filled 2019-01-09: qty 2

## 2019-01-09 MED ORDER — ACETAMINOPHEN 325 MG PO TABS
650.0000 mg | ORAL_TABLET | ORAL | Status: DC | PRN
  Administered 2019-01-11: 650 mg via ORAL
  Filled 2019-01-09: qty 2

## 2019-01-09 MED ORDER — SODIUM CHLORIDE 0.9 % IV SOLN
INTRAVENOUS | Status: DC
  Administered 2019-01-09 (×2): via INTRAVENOUS

## 2019-01-09 MED ORDER — OXYCODONE HCL 5 MG PO TABS: 5.0000 mg | ORAL_TABLET | ORAL | Status: DC | PRN

## 2019-01-09 MED ORDER — METHOCARBAMOL 1000 MG/10ML IJ SOLN
500.0000 mg | Freq: Four times a day (QID) | INTRAVENOUS | Status: DC
  Administered 2019-01-11: 500 mg via INTRAVENOUS
  Filled 2019-01-09 (×6): qty 5

## 2019-01-09 MED ORDER — SODIUM CHLORIDE 0.9 % IV SOLN
INTRAVENOUS | Status: DC | PRN
  Administered 2019-01-09: 13:00:00 40 mL

## 2019-01-09 MED ORDER — CEFAZOLIN SODIUM-DEXTROSE 2-4 GM/100ML-% IV SOLN
2.0000 g | INTRAVENOUS | Status: AC
  Administered 2019-01-09: 2 g via INTRAVENOUS

## 2019-01-09 MED ORDER — VANCOMYCIN HCL 1.5 G IV SOLR
1500.0000 mg | INTRAVENOUS | Status: AC
  Administered 2019-01-09: 1500 mg via INTRAVENOUS
  Filled 2019-01-09: qty 1500

## 2019-01-09 MED ORDER — BUPIVACAINE-EPINEPHRINE (PF) 0.5% -1:200000 IJ SOLN
INTRAMUSCULAR | Status: DC | PRN
  Administered 2019-01-09: 7 mL

## 2019-01-09 MED ORDER — FENTANYL CITRATE (PF) 100 MCG/2ML IJ SOLN
INTRAMUSCULAR | Status: AC
  Filled 2019-01-09: qty 2

## 2019-01-09 MED ORDER — SODIUM CHLORIDE FLUSH 0.9 % IV SOLN
INTRAVENOUS | Status: AC
  Filled 2019-01-09: qty 10

## 2019-01-09 MED ORDER — ESCITALOPRAM OXALATE 10 MG PO TABS
20.0000 mg | ORAL_TABLET | Freq: Every day | ORAL | Status: DC
  Administered 2019-01-09 – 2019-01-10 (×2): 20 mg via ORAL
  Filled 2019-01-09 (×3): qty 2

## 2019-01-09 MED ORDER — DIAZEPAM 5 MG PO TABS
5.0000 mg | ORAL_TABLET | Freq: Once | ORAL | Status: AC
  Administered 2019-01-09: 22:00:00 5 mg via ORAL
  Filled 2019-01-09: qty 1

## 2019-01-09 MED ORDER — INSULIN ASPART 100 UNIT/ML ~~LOC~~ SOLN
SUBCUTANEOUS | Status: AC
  Filled 2019-01-09: qty 1

## 2019-01-09 MED ORDER — DEXMEDETOMIDINE HCL IN NACL 80 MCG/20ML IV SOLN
INTRAVENOUS | Status: AC
  Filled 2019-01-09: qty 20

## 2019-01-09 MED ORDER — VANCOMYCIN HCL 1000 MG IV SOLR
INTRAVENOUS | Status: AC
  Filled 2019-01-09: qty 1000

## 2019-01-09 MED ORDER — METHOCARBAMOL 500 MG PO TABS
1000.0000 mg | ORAL_TABLET | Freq: Four times a day (QID) | ORAL | Status: DC
  Administered 2019-01-09 – 2019-01-11 (×7): 1000 mg via ORAL
  Filled 2019-01-09 (×10): qty 2

## 2019-01-09 MED ORDER — BICTEGRAVIR-EMTRICITAB-TENOFOV 50-200-25 MG PO TABS
1.0000 | ORAL_TABLET | Freq: Every day | ORAL | Status: DC
  Administered 2019-01-09 – 2019-01-11 (×3): 1 via ORAL
  Filled 2019-01-09 (×3): qty 1

## 2019-01-09 MED ORDER — INSULIN ASPART 100 UNIT/ML ~~LOC~~ SOLN
8.0000 [IU] | Freq: Once | SUBCUTANEOUS | Status: AC
  Administered 2019-01-09: 8 [IU] via SUBCUTANEOUS

## 2019-01-09 MED ORDER — OXYCODONE HCL 5 MG PO TABS
10.0000 mg | ORAL_TABLET | ORAL | Status: DC | PRN
  Administered 2019-01-09 – 2019-01-11 (×7): 10 mg via ORAL
  Filled 2019-01-09 (×7): qty 2

## 2019-01-09 MED ORDER — ALBUTEROL SULFATE HFA 108 (90 BASE) MCG/ACT IN AERS
2.0000 | INHALATION_SPRAY | Freq: Once | RESPIRATORY_TRACT | Status: AC
  Administered 2019-01-09: 2 via RESPIRATORY_TRACT
  Filled 2019-01-09: qty 6.7

## 2019-01-09 MED ORDER — SPIRONOLACTONE 25 MG PO TABS
50.0000 mg | ORAL_TABLET | Freq: Every day | ORAL | Status: DC
  Administered 2019-01-09 – 2019-01-11 (×2): 50 mg via ORAL
  Filled 2019-01-09 (×2): qty 2

## 2019-01-09 MED ORDER — POLYETHYLENE GLYCOL 3350 17 G PO PACK
17.0000 g | PACK | Freq: Every day | ORAL | Status: DC | PRN
  Administered 2019-01-10: 17 g via ORAL
  Filled 2019-01-09: qty 1

## 2019-01-09 MED ORDER — BUPIVACAINE-EPINEPHRINE (PF) 0.5% -1:200000 IJ SOLN
INTRAMUSCULAR | Status: AC
  Filled 2019-01-09: qty 30

## 2019-01-09 MED ORDER — BISACODYL 5 MG PO TBEC
5.0000 mg | DELAYED_RELEASE_TABLET | Freq: Every day | ORAL | Status: DC | PRN
  Administered 2019-01-10: 5 mg via ORAL
  Filled 2019-01-09: qty 1

## 2019-01-09 MED ORDER — ACETAMINOPHEN 650 MG RE SUPP: 650.0000 mg | RECTAL | Status: DC | PRN

## 2019-01-09 MED ORDER — INSULIN ASPART 100 UNIT/ML ~~LOC~~ SOLN
SUBCUTANEOUS | Status: AC
  Administered 2019-01-09: 5 [IU] via SUBCUTANEOUS
  Filled 2019-01-09: qty 1

## 2019-01-09 MED ORDER — ACETAMINOPHEN 500 MG PO TABS
1000.0000 mg | ORAL_TABLET | Freq: Four times a day (QID) | ORAL | Status: AC
  Administered 2019-01-09 – 2019-01-10 (×4): 1000 mg via ORAL
  Filled 2019-01-09 (×4): qty 2

## 2019-01-09 MED ORDER — SUCCINYLCHOLINE CHLORIDE 20 MG/ML IJ SOLN
INTRAMUSCULAR | Status: DC | PRN
  Administered 2019-01-09: 140 mg via INTRAVENOUS

## 2019-01-09 MED ORDER — SODIUM CHLORIDE 0.9 % IV SOLN: 250.0000 mL | INTRAVENOUS | Status: DC

## 2019-01-09 MED ORDER — FAMOTIDINE 20 MG PO TABS
ORAL_TABLET | ORAL | Status: AC
  Filled 2019-01-09: qty 1

## 2019-01-09 MED ORDER — BUPIVACAINE LIPOSOME 1.3 % IJ SUSP
INTRAMUSCULAR | Status: AC
  Filled 2019-01-09: qty 20

## 2019-01-09 MED ORDER — INSULIN ASPART 100 UNIT/ML ~~LOC~~ SOLN
0.0000 [IU] | Freq: Three times a day (TID) | SUBCUTANEOUS | Status: DC
  Administered 2019-01-09: 5 [IU] via SUBCUTANEOUS
  Administered 2019-01-10: 2 [IU] via SUBCUTANEOUS
  Administered 2019-01-10: 5 [IU] via SUBCUTANEOUS
  Administered 2019-01-10 – 2019-01-11 (×3): 3 [IU] via SUBCUTANEOUS
  Filled 2019-01-09 (×6): qty 1

## 2019-01-09 MED ORDER — ONDANSETRON HCL 4 MG/2ML IJ SOLN: 4.0000 mg | Freq: Once | INTRAMUSCULAR | Status: DC | PRN

## 2019-01-09 MED ORDER — LIDOCAINE HCL (CARDIAC) PF 100 MG/5ML IV SOSY
PREFILLED_SYRINGE | INTRAVENOUS | Status: DC | PRN
  Administered 2019-01-09: 100 mg via INTRAVENOUS

## 2019-01-09 MED ORDER — ONDANSETRON HCL 4 MG/2ML IJ SOLN
INTRAMUSCULAR | Status: AC
  Filled 2019-01-09: qty 2

## 2019-01-09 MED ORDER — THROMBIN 5000 UNITS EX SOLR
CUTANEOUS | Status: DC | PRN
  Administered 2019-01-09: 5000 [IU] via TOPICAL

## 2019-01-09 MED ORDER — DEXAMETHASONE SODIUM PHOSPHATE 10 MG/ML IJ SOLN
INTRAMUSCULAR | Status: AC
  Filled 2019-01-09: qty 1

## 2019-01-09 MED ORDER — KETOROLAC TROMETHAMINE 15 MG/ML IJ SOLN
15.0000 mg | Freq: Four times a day (QID) | INTRAMUSCULAR | Status: AC
  Administered 2019-01-09 – 2019-01-10 (×6): 15 mg via INTRAVENOUS
  Filled 2019-01-09 (×6): qty 1

## 2019-01-09 MED ORDER — ONDANSETRON HCL 4 MG PO TABS: 4.0000 mg | ORAL_TABLET | Freq: Four times a day (QID) | ORAL | Status: DC | PRN

## 2019-01-09 MED ORDER — SUCCINYLCHOLINE CHLORIDE 20 MG/ML IJ SOLN
INTRAMUSCULAR | Status: AC
  Filled 2019-01-09: qty 1

## 2019-01-09 MED ORDER — FENTANYL CITRATE (PF) 100 MCG/2ML IJ SOLN
INTRAMUSCULAR | Status: DC | PRN
  Administered 2019-01-09: 100 ug via INTRAVENOUS
  Administered 2019-01-09 (×3): 50 ug via INTRAVENOUS

## 2019-01-09 MED ORDER — HYDROMORPHONE HCL 1 MG/ML IJ SOLN: 0.5000 mg | INTRAMUSCULAR | Status: DC | PRN

## 2019-01-09 MED ORDER — MENTHOL 3 MG MT LOZG
1.0000 | LOZENGE | OROMUCOSAL | Status: DC | PRN
  Filled 2019-01-09: qty 9

## 2019-01-09 SURGICAL SUPPLY — 88 items
ADH SKN CLS APL DERMABOND .7 (GAUZE/BANDAGES/DRESSINGS) ×1
AGENT HMST MTR 8 SURGIFLO (HEMOSTASIS) ×1
ANCHOR 8X19.2 (Anchor) ×2 IMPLANT
APL PRP STRL LF DISP 70% ISPRP (MISCELLANEOUS) ×2
BONE MATRIX OSTEOCEL PRO MED (Bone Implant) ×6 IMPLANT
BULB RESERV EVAC DRAIN JP 100C (MISCELLANEOUS) ×4 IMPLANT
BUR NEURO DRILL SOFT 3.0X3.8M (BURR) ×3 IMPLANT
CANISTER SUCT 1200ML W/VALVE (MISCELLANEOUS) ×6 IMPLANT
CHLORAPREP W/TINT 26 (MISCELLANEOUS) ×6 IMPLANT
CNTNR SPEC 2.5X3XGRAD LEK (MISCELLANEOUS) ×1
CONT SPEC 4OZ STER OR WHT (MISCELLANEOUS) ×2
CONT SPEC 4OZ STRL OR WHT (MISCELLANEOUS) ×1
CONTAINER SPEC 2.5X3XGRAD LEK (MISCELLANEOUS) ×1 IMPLANT
COUNTER NEEDLE 20/40 LG (NEEDLE) ×3 IMPLANT
COVER LIGHT HANDLE STERIS (MISCELLANEOUS) ×6 IMPLANT
COVER WAND RF STERILE (DRAPES) ×3 IMPLANT
CUP MEDICINE 2OZ PLAST GRAD ST (MISCELLANEOUS) ×6 IMPLANT
DERMABOND ADVANCED (GAUZE/BANDAGES/DRESSINGS) ×2
DERMABOND ADVANCED .7 DNX12 (GAUZE/BANDAGES/DRESSINGS) ×1 IMPLANT
DRAIN CHANNEL JP 10F RND 20C F (MISCELLANEOUS) ×4 IMPLANT
DRAPE C-ARM 42X72 X-RAY (DRAPES) ×6 IMPLANT
DRAPE C-ARMOR (DRAPES) ×3 IMPLANT
DRAPE INCISE IOBAN 66X45 STRL (DRAPES) ×3 IMPLANT
DRAPE LAPAROTOMY 100X77 ABD (DRAPES) ×3 IMPLANT
DRAPE MICROSCOPE SPINE 48X150 (DRAPES) ×3 IMPLANT
DRAPE POUCH INSTRU U-SHP 10X18 (DRAPES) ×3 IMPLANT
DRAPE SURG 17X11 SM STRL (DRAPES) ×12 IMPLANT
DRSG OPSITE POSTOP 4X8 (GAUZE/BANDAGES/DRESSINGS) IMPLANT
DRSG TEGADERM 4X4.75 (GAUZE/BANDAGES/DRESSINGS) IMPLANT
ELECT CAUTERY BLADE TIP 2.5 (TIP) ×3
ELECT EZSTD 165MM 6.5IN (MISCELLANEOUS)
ELECT REM PT RETURN 9FT ADLT (ELECTROSURGICAL) ×3
ELECTRODE CAUTERY BLDE TIP 2.5 (TIP) ×1 IMPLANT
ELECTRODE EZSTD 165MM 6.5IN (MISCELLANEOUS) IMPLANT
ELECTRODE REM PT RTRN 9FT ADLT (ELECTROSURGICAL) ×1 IMPLANT
FRAME EYE SHIELD (PROTECTIVE WEAR) ×6 IMPLANT
GAUZE XEROFORM 4X4 STRL (GAUZE/BANDAGES/DRESSINGS) IMPLANT
GLOVE BIO SURGEON STRL SZ 6.5 (GLOVE) ×4 IMPLANT
GLOVE BIO SURGEONS STRL SZ 6.5 (GLOVE) ×2
GLOVE BIOGEL PI IND STRL 7.0 (GLOVE) ×1 IMPLANT
GLOVE BIOGEL PI INDICATOR 7.0 (GLOVE) ×2
GLOVE SURG SYN 7.0 (GLOVE) ×3 IMPLANT
GLOVE SURG SYN 7.0 PF PI (GLOVE) ×1 IMPLANT
GLOVE SURG SYN 8.5  E (GLOVE) ×6
GLOVE SURG SYN 8.5 E (GLOVE) ×3 IMPLANT
GLOVE SURG SYN 8.5 PF PI (GLOVE) ×3 IMPLANT
GOWN SRG XL LVL 3 NONREINFORCE (GOWNS) ×1 IMPLANT
GOWN STRL NON-REIN TWL XL LVL3 (GOWNS) ×3
GOWN STRL REUS W/TWL MED LVL3 (GOWN DISPOSABLE) ×3 IMPLANT
GRADUATE 1200CC STRL 31836 (MISCELLANEOUS) ×3 IMPLANT
HEMOVAC 400CC 10FR (MISCELLANEOUS) IMPLANT
IMPL TLX20 10X11X31 20D (Cage) IMPLANT
IMPL TLX20 9X11X31 20D (Cage) IMPLANT
KIT SPINAL PRONEVIEW (KITS) ×3 IMPLANT
KNIFE BAYONET SHORT DISCETOMY (MISCELLANEOUS) IMPLANT
MARKER SKIN DUAL TIP RULER LAB (MISCELLANEOUS) ×6 IMPLANT
NDL SAFETY ECLIPSE 18X1.5 (NEEDLE) ×1 IMPLANT
NEEDLE HYPO 18GX1.5 SHARP (NEEDLE) ×3
NEEDLE HYPO 22GX1.5 SAFETY (NEEDLE) ×3 IMPLANT
NS IRRIG 1000ML POUR BTL (IV SOLUTION) ×3 IMPLANT
PACK LAMINECTOMY NEURO (CUSTOM PROCEDURE TRAY) ×3 IMPLANT
PAD ARMBOARD 7.5X6 YLW CONV (MISCELLANEOUS) ×3 IMPLANT
PUTTY DBM PROPEL LRG (Putty) ×1 IMPLANT
PUTTY PROPEL LRG (Putty) ×1 IMPLANT
ROD RELINE-O LORD 5.5X110MM (Rod) ×4 IMPLANT
SCREW LOCK RELINE 5.5 TULIP (Screw) ×24 IMPLANT
SCREW RELINE-O POLY 7.5X45 (Screw) ×4 IMPLANT
SCREW RELINE-O POLY 7.5X50 (Screw) ×6 IMPLANT
SCREW RLINE PLY 2S 50X7.5XPA (Screw) IMPLANT
SCREW SPINAL 8.5X100 RELINE (Screw) ×2 IMPLANT
SCREW SPINAL 8.5X80 RELINE (Screw) ×2 IMPLANT
SPOGE SURGIFLO 8M (HEMOSTASIS) ×2
SPONGE DRAIN TRACH 4X4 STRL 2S (GAUZE/BANDAGES/DRESSINGS) IMPLANT
SPONGE SURGIFLO 8M (HEMOSTASIS) ×1 IMPLANT
STAPLER SKIN PROX 35W (STAPLE) ×2 IMPLANT
SUT DVC VLOC 3-0 CL 6 P-12 (SUTURE) ×3 IMPLANT
SUT VIC AB 0 CT1 27 (SUTURE) ×3
SUT VIC AB 0 CT1 27XCR 8 STRN (SUTURE) ×1 IMPLANT
SUT VIC AB 2-0 CT1 18 (SUTURE) ×7 IMPLANT
SYR 20ML LL LF (SYRINGE) ×3 IMPLANT
SYR 30ML LL (SYRINGE) ×6 IMPLANT
SYR 3ML LL SCALE MARK (SYRINGE) ×3 IMPLANT
TLX20 IMPLANT 10X11X31 20D (Cage) ×3 IMPLANT
TLX20 IMPLANT 9X11X31 20D (Cage) ×3 IMPLANT
TOWEL OR 17X26 4PK STRL BLUE (TOWEL DISPOSABLE) ×9 IMPLANT
TRAY FOLEY SLVR 16FR LF STAT (SET/KITS/TRAYS/PACK) IMPLANT
TUBING CONNECTING 10 (TUBING) ×2 IMPLANT
TUBING CONNECTING 10' (TUBING) ×1

## 2019-01-09 NOTE — Anesthesia Post-op Follow-up Note (Signed)
Anesthesia QCDR form completed.        

## 2019-01-09 NOTE — Anesthesia Preprocedure Evaluation (Addendum)
Anesthesia Evaluation  Patient identified by MRN, date of birth, ID band Patient awake    Reviewed: Allergy & Precautions, NPO status , Patient's Chart, lab work & pertinent test results  History of Anesthesia Complications Negative for: history of anesthetic complications  Airway Mallampati: III       Dental   Pulmonary asthma , neg sleep apnea, former smoker,           Cardiovascular hypertension, Pt. on medications (-) Past MI and (-) CHF (-) dysrhythmias (-) Valvular Problems/Murmurs     Neuro/Psych neg Seizures Anxiety Depression    GI/Hepatic Neg liver ROS, GERD  ,  Endo/Other  diabetes, Type 2, Oral Hypoglycemic Agents, Insulin Dependent  Renal/GU negative Renal ROS     Musculoskeletal   Abdominal   Peds  Hematology   Anesthesia Other Findings   Reproductive/Obstetrics                            Anesthesia Physical Anesthesia Plan  ASA: III  Anesthesia Plan: General   Post-op Pain Management:    Induction: Intravenous  PONV Risk Score and Plan: 2 and Ondansetron and Midazolam  Airway Management Planned: Oral ETT  Additional Equipment:   Intra-op Plan:   Post-operative Plan:   Informed Consent: I have reviewed the patients History and Physical, chart, labs and discussed the procedure including the risks, benefits and alternatives for the proposed anesthesia with the patient or authorized representative who has indicated his/her understanding and acceptance.       Plan Discussed with:   Anesthesia Plan Comments:         Anesthesia Quick Evaluation

## 2019-01-09 NOTE — Progress Notes (Signed)
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  POD0: Tolerated procedure well.  Patient was evaluated in postop recovery still disoriented from anesthesia.  No complaints at this time.  Physical Exam: Vitals:   01/09/19 1627 01/09/19 1811  BP: 123/84 122/82  Pulse: 86 92  Resp: 18 18  Temp: 97.8 F (36.6 C) 98.2 F (36.8 C)  SpO2: 99% 100%   Strength: Unable to accurately assess at this time Sensation: Unable to accurately assess at this time Skin: Dressings clean and dry  Data:  No results for input(s): NA, K, CL, CO2, BUN, CREATININE, LABGLOM, GLUCOSE, CALCIUM in the last 168 hours. No results for input(s): AST, ALT, ALKPHOS in the last 168 hours.  Invalid input(s): TBILI   No results for input(s): WBC, HGB, HCT, PLT in the last 168 hours. No results for input(s): APTT, INR in the last 168 hours.       Other tests/results: Lumbar x-rays pending  Assessment/Plan:  [name] is POD 0 status post L4-5 and L5-S1 TLIF.  Will continue to monitor.  - monitor drain output - mobilize - pain control - DVT prophylaxis - PTOT - imaging - Bracing - Catheter  Marin Olp PA-C Department of Neurosurgery

## 2019-01-09 NOTE — Transfer of Care (Signed)
Immediate Anesthesia Transfer of Care Note  Patient: [name]  Procedure(s) Performed: TRANSFORAMINAL LUMBAR INTERBODY FUSION (TLIF) WITH PEDICLE SCREW FIXATION 3 LEVEL, POSTERIOR SPINAL FUSION (N/A Back)  Patient Location: PACU  Anesthesia Type:General  Level of Consciousness: awake, alert  and oriented  Airway & Oxygen Therapy: Patient Spontanous Breathing and Patient connected to face mask oxygen  Post-op Assessment: Report given to RN and Post -op Vital signs reviewed and stable  Post vital signs: Reviewed and stable  Last Vitals:  Vitals Value Taken Time  BP 111/71 01/09/19 1331  Temp 36.8 C 01/09/19 1330  Pulse 98 01/09/19 1335  Resp 19 01/09/19 1335  SpO2 97 % 01/09/19 1335  Vitals shown include unvalidated device data.  Last Pain:  Vitals:   01/09/19 1330  TempSrc:   PainSc: Asleep      Patients Stated Pain Goal: 3 (21/11/55 2080)  Complications: No apparent anesthesia complications

## 2019-01-09 NOTE — Op Note (Signed)
Indications: Mr. 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  He failed conservative management and elected for surgical intervention  Findings: partial correction of anterolisthesis  Preoperative Diagnosis: see above Postoperative Diagnosis: same   EBL: 700 ml IVF: 1500 ml Drains: 2 placed Disposition: Extubated and Stable to PACU Complications: none  Preoperative Note:   Risks of surgery discussed include: infection, bleeding, stroke, coma, death, paralysis, CSF leak, nerve/spinal cord injury, numbness, tingling, weakness, complex regional pain syndrome, recurrent stenosis and/or disc herniation, vascular injury, development of instability, neck/back pain, need for further surgery, persistent symptoms, development of deformity, and the risks of anesthesia. The patient understood these risks and agreed to proceed.  Operative Note:  1. Transforaminal Lumbar Interbody Fusion L4/5 and L5-S1 2. Posterolateral arthrodesis L4 to S2 3. Posterior segmental instrumentation L4 to S2 using Nuvasive Reline 4. Lumbar decompression including central decompression, bilateral medial facetectomies, and bilateral foraminotomies at L4/5 and L5/S1 5. Harvesting of autograft via the same incision 6. Placement of a biomechanical device (Nuvasive TLX) at L4/5 and L5/S1 for anterior arthrodesis    The patient was brought to the Operating Room, intubated and turned into the prone position. All pressure points were checked and double checked. Flouroscopy was used to mark the incision. The patient was prepped and draped in the standard fashion. A full timeout was performed. Preoperative antibiotics were given. The incision was injected with local anesthetic.  The incision was opened with a scalpel, then the soft tissues divided with the Bovie. Self-retaining retractors were placed. The paraspinus muscles were reflected laterally in  subperiosteal fashion until the transverse processes were visible. Flouroscopy was used to confirm our localization.   The self-retaining retractors were repositioned. We then placed pedicle screws.   At L4 on one side, a starting point was chosen based on anatomic landmarks, then breached with a high speed drill. A pedicle finder probe was used to cannulate the pedicle, then the balltip probe used to confirm lack of breach. The tract was tapped, re-checked with the balltip probe, then a 7.5 x 50 mm pedicle screw was placed. The procedure was then repeated contralaterally and the same size screw placed.  At L5 on one side, a starting point was chosen based on anatomic landmarks, then breached with a high speed drill. A pedicle finder probe was used to cannulate the pedicle, then the balltip probe used to confirm lack of breach. The tract was tapped, re-checked with the balltip probe, then a 7.5 x 45 mm pedicle screw was placed. The procedure was then repeated contralaterally and the same size screw placed.  At S1 on one side, a starting point was chosen based on anatomic landmarks, then breached with a high speed drill. A pedicle finder probe was used to cannulate the pedicle, then the balltip probe used to confirm lack of breach. The tract was tapped, re-checked with the balltip probe, then a 7.5 x 45 mm pedicle screw was placed. The procedure was then repeated contralaterally and the same size screw placed.  At this point we turned to placement of S2 alar iliac screws.  Using teardrop views, an appropriate start point was chosen and the cortex breached.  The long pedicle finders were used to cannulate the pelvis into the ilium.  The tract was felt and no breaches were found.  On the right, an 8.5x100 mm screw was placed.  The same procedure was performed on the left and 8.5x80  mm screw was placed.  The placement was confirmed with radiographs.  Thus, the construct terminates with fixation to the  pelvis.  After placement of pedicle screws, a screw-to-screw distractor was placed to distract the L5/S1 disc space. We then turned attention to performing the transforaminal decompression and interbody fusion. The right L5/S1 facet was removed with osteotomes and the drill, and handed off for preparation as autograft. The traversing and exiting nerve roots on the right were identified and protected. The disc was opened using a scalpel. After incising the disc space, we took a combination of pituitary rongeurs, Kerrison rongeurs, disc scrapers, and curettes to remove a majority of the disc material.  We prepared the end plates for accepting the interbody fusion.  We removed the cartilaginous plate, preserved the cortical endplate if possible during this procedure.  We serially dilated up in order to increase the size of the disc space, while protecting the traversing and exiting nerve roots, until we had sized up to a trial.    The trial was removed, and the disc space packed with autograft and allograft. The Nuvasive TLX TLIF biomechanical device was inserted, then backfilled with a mixture of allograft and autograft, with care taken to protect the nerve roots and thecal sac. After placement of the device, the screw-screw distractor was removed.  We then moved the screw-to-screw distractor was placed to distract the L4/5 disc space. We then turned attention to performing the transforaminal decompression and interbody fusion. The right L4/5 facet was removed with osteotomes and the drill, and handed off for preparation as autograft. The traversing and exiting nerve roots on the right were identified and protected. The disc was opened using a scalpel. After incising the disc space, we took a combination of pituitary rongeurs, Kerrison rongeurs, disc scrapers, and curettes to remove a majority of the disc material.  We prepared the end plates for accepting the interbody fusion.  We removed the cartilaginous  plate, preserved the cortical endplate if possible during this procedure.  We serially dilated up in order to increase the size of the disc space, while protecting the traversing and exiting nerve roots, until we had sized up to a trial.    The trial was removed, and the disc space packed with autograft and allograft. The Nuvasive TLX TLIF biomechanical device was packed with a mixture of allograft and autograft and then inserted, with care taken to protect the nerve roots and thecal sac. After placement of the device, the screw-screw distractor was removed.   Rods were measured to length, cut, and shaped. The rods were secured using locking caps to manufacturer's specifications. Final AP and lateral radiographs were taken to confirm placement of instrumentation and appropriate alignment. The wound was copiously irrigated, then the external surfaces of the remaining lamina, facet, and transverse processes from L4 to S2 were decorticated. A mixture of allograft and autograft was placed over the decorticated surfaces for arthrodesis.  2 drains were placed subfascially.   After hemostasis, the wound was closed in layers with 0 and 2-0 vicryl. Staples were applied to the incision.  The patient was then flipped supine and extubated with incident. All counts were correct times 2 at the end of the case. No immediate complications were noted.  Ivar DrapeAmanda Ferri PA assisted throughout the procedure.  Venetia Nighthester Aliha Diedrich MD

## 2019-01-09 NOTE — Anesthesia Procedure Notes (Signed)
Procedure Name: Intubation Performed by: Fredderick Phenix, CRNA Pre-anesthesia Checklist: Patient identified, Emergency Drugs available, Suction available and Patient being monitored Patient Re-evaluated:Patient Re-evaluated prior to induction Oxygen Delivery Method: Circle system utilized Preoxygenation: Pre-oxygenation with 100% oxygen Induction Type: IV induction Ventilation: Mask ventilation without difficulty Laryngoscope Size: Mac and 4 Grade View: Grade I Tube type: Oral Tube size: 7.5 mm Number of attempts: 1 Airway Equipment and Method: Stylet and Oral airway Placement Confirmation: ETT inserted through vocal cords under direct vision,  positive ETCO2 and breath sounds checked- equal and bilateral Tube secured with: Tape Dental Injury: Teeth and Oropharynx as per pre-operative assessment

## 2019-01-09 NOTE — H&P (Signed)
History of Present Illness: 01/09/2019 [name] returns for operative intervention after medical optimization for lumbar fusion.  She has successfully quite smoking and improved her glucose control.  She continus to have severe symptoms.  07/19/2018 [name] is here today with a chief complaint of low back pain, right leg weakness, right buttock and leg burning/numbness, burning in toes of right foot. She also reports burning sensations that shoot down her leg with coughing. She also reports frequent falls.  She has been having pain for well over a year. This is been worsening for the past year. She describes pain down her right leg in her buttock and posterior thigh into her anterolateral calf. There is numbness in her right calf. This is made worse by standing or walking. It is as bad as 8 or 9 out of 10.  She has seen another surgeon who recommended an L3-S1 fusion. Unfortunate, her diabetes control has not been great in the past, though she has made great strides recently. She is decreased smoking down to 4 cigarettes/day.  Bowel/Bladder Dysfunction: none  Conservative measures:  Physical therapy: has done exercises provided by orthopedics Multimodal medical therapy including regular antiinflammatories: gabapentin, diclofenac, norco, tramadol Injections: has tried epidural steroid injections  03/19/18: L5-S1 TFESI with minimal relief for a couple of days  Past Surgery: denies  [name] has no symptoms of cervical myelopathy.  The symptoms are causing a significant impact on the patient's life.   Review of Systems:  A 10 point review of systems is negative, except for the pertinent positives and negatives detailed in the HPI.  Past Medical History: Past Medical History:  Diagnosis Date  . Anxiety  . Chronic low back pain  . Depression  . Diabetes mellitus without complication (CMS-HCC)  . HIV (human immunodeficiency virus infection) (CMS-HCC)  . Hx of  degenerative disc disease  . Hypertension  . Ischemic stroke (CMS-HCC)  . MRSA carrier  . Perirectal abscess  . Testicular mass  . Tobacco use disorder  . Transgender   Past Surgical History: Past Surgical History:  Procedure Laterality Date  . APPENDECTOMY   Allergies: Allergies as of 07/19/2018 - Reviewed 07/19/2018  Allergen Reaction Noted  . Propoxyphene n-acetaminophen Other (See Comments) 07/12/2018   Medications:  Current Meds  Medication Sig  . albuterol (VENTOLIN HFA) 108 (90 Base) MCG/ACT inhaler Inhale 2 puffs into the lungs every 6 (six) hours as needed for wheezing or shortness of breath.  . bictegravir-emtricitabine-tenofovir AF (BIKTARVY) 50-200-25 MG TABS tablet Take 1 tablet by mouth daily.  . celecoxib (CELEBREX) 200 MG capsule Take 200 mg by mouth 2 (two) times daily.  Marland Kitchen. escitalopram (LEXAPRO) 20 MG tablet TAKE 1 TABLET BY MOUTH AT BEDTIME  . glimepiride (AMARYL) 4 MG tablet Take 1 tablet (4 mg total) by mouth daily before breakfast.  . Insulin Glargine (LANTUS SOLOSTAR) 100 UNIT/ML Solostar Pen Inject 40 Units into the skin at bedtime.  Marland Kitchen. oxyCODONE-acetaminophen (PERCOCET/ROXICET) 5-325 MG tablet Take 2 tablets by mouth every 6 (six) hours.  Marland Kitchen. spironolactone (ALDACTONE) 50 MG tablet Take 1 tablet (50 mg total) by mouth daily.  . varenicline (CHANTIX CONTINUING MONTH PAK) 1 MG tablet Take 1 tablet (1 mg total) by mouth 2 (two) times daily.  . [DISCONTINUED] albuterol (PROVENTIL HFA;VENTOLIN HFA) 108 (90 Base) MCG/ACT inhaler Inhale 2 puffs into the lungs every 6 (six) hours as needed for wheezing or shortness of breath.  . [DISCONTINUED] escitalopram (LEXAPRO) 20 MG tablet TAKE 1 TABLET BY  MOUTH AT BEDTIME (Patient taking differently: Take 20 mg by mouth at bedtime. )  . [DISCONTINUED] glimepiride (AMARYL) 4 MG tablet Take 1 tablet (4 mg total) by mouth daily before breakfast.  . [DISCONTINUED] Insulin Glargine (LANTUS SOLOSTAR) 100 UNIT/ML Solostar Pen Inject 40  Units into the skin daily. (Patient taking differently: Inject 40 Units into the skin at bedtime. )  . [DISCONTINUED] metFORMIN (GLUCOPHAGE) 1000 MG tablet Take 1 tablet (1,000 mg total) by mouth 2 (two) times daily with a meal.    Social History: Social History   Tobacco Use  . Smoking status: Current Every Day Smoker  Packs/day: 0.25  Types: Cigarettes  . Smokeless tobacco: Never Used  . Tobacco comment: 4 cig/day  Substance Use Topics  . Alcohol use: Not Currently  . Drug use: Not on file   Family Medical History: History reviewed. No pertinent family history.  Physical Examination: Vitals:   Vitals:   01/09/19 0614  BP: 140/90  Pulse: 93  Resp: 16  Temp: (!) 97 F (36.1 C)  SpO2: 97%   Heart sounds normal no MRG. Chest Clear to Auscultation Bilaterally.   General: Patient is well developed, well nourished, calm, collected, and in no apparent distress. Attention to examination is appropriate.  Psychiatric: Patient is non-anxious.  Head: Pupils equal, round, and reactive to light.  ENT: Oral mucosa appears well hydrated.  Neck: Supple. Full range of motion.  Respiratory: Patient is breathing without any difficulty.  Extremities: No edema.  Vascular: Palpable dorsal pedal pulses.  Skin: On exposed skin, there are no abnormal skin lesions.  NEUROLOGICAL:   Awake, alert, oriented to person, place, and time. Speech is clear and fluent. Fund of knowledge is appropriate.   Cranial Nerves: Pupils equal round and reactive to light. Facial tone is symmetric. Facial sensation is symmetric. Shoulder shrug is symmetric. Tongue protrusion is midline. There is no pronator drift.  ROM of spine: diminshed. Palpation of spine: non tender.   Strength: Side Biceps Triceps Deltoid Interossei Grip Wrist Ext. Wrist Flex.  R 5 5 5 5 5 5 5   L 5 5 5 5 5 5 5    Side Iliopsoas Quads Hamstring PF DF EHL  R 5 5 5 5 5  4+  L 5 5 5 5 5 5    Reflexes are 1+ and symmetric at the  biceps, triceps, brachioradialis, patella and achilles. Hoffman's is absent. Clonus is not present. Toes are down-going.  Bilateral upper and lower extremity sensation is intact to light touch except numbness R L5.  Gait is antalgic. Rapid alternating movements are normal.   Medical Decision Making  Imaging: MRI L spine 04/29/2018  IMPRESSION: 1. New large right paracentral disc extrusion at L4-L5 obliterating the right lateral recess with impingement of the descending right L5 nerve root. 2. Unchanged bilateral L5 pars defects with 14 mm anterolisthesis at L5-S1 and severe bilateral neuroforaminal stenosis impinging on the bilateral exiting L5 nerve roots. 3. New small central disc extrusion and annular fissure at L3-L4 with unchanged mild bilateral neuroforaminal stenosis.  Electronically Signed By: Obie DredgeWilliam T Derry M.D. On: 04/29/2018 09:21  I have personally reviewed the images and agree with the above interpretation.  Assessment and Plan: [name] is a pleasant 48 y.o. male with bilateral pars defects at L5 with 14 mm anterolisthesis (grade 2 spondylolisthesis) of L5 on S1. There is also a large disc herniation at L4-5 causing severe compression of the right L5 nerve root, causing lumbar radiculopathy. She has significant symptoms from  this. She is tried injections and exercises without improvement.  Given the level of anterolisthesis, I have recommended surgical intervention. We will proceed with L4-S2 posterior spinal fusion with L4-5 and L5-S1 transforaminal lumbar interbody fusion.    [name] MD, [facility] Department of Neurosurgery

## 2019-01-10 ENCOUNTER — Inpatient Hospital Stay: Payer: Medicaid Other

## 2019-01-10 ENCOUNTER — Encounter: Payer: Self-pay | Admitting: Neurosurgery

## 2019-01-10 LAB — GLUCOSE, CAPILLARY
Glucose-Capillary: 194 mg/dL — ABNORMAL HIGH (ref 70–99)
Glucose-Capillary: 202 mg/dL — ABNORMAL HIGH (ref 70–99)
Glucose-Capillary: 141 mg/dL — ABNORMAL HIGH (ref 70–99)
Glucose-Capillary: 185 mg/dL — ABNORMAL HIGH (ref 70–99)
Glucose-Capillary: 202 mg/dL — ABNORMAL HIGH (ref 70–99)

## 2019-01-10 NOTE — Progress Notes (Signed)
PT Cancellation Note  Patient Details Name: DEMARIAN EPPS MRN: [id number] DOB: 10/21/1970   Cancelled Treatment:    Reason Eval/Treat Not Completed: Other (comment). Treatment attempted per primary therapist; however, pt notes recently coming back from an x ray and falling into wall when trying to exit w/c. Pt pain has increased and nurse notes pt will be getting medication soon. Pt wishes to defer further walking until tomorrow. Agreed.    Larae Grooms, PTA 01/10/2019, 4:17 PM

## 2019-01-10 NOTE — Anesthesia Postprocedure Evaluation (Signed)
Anesthesia Post Note  Patient: SEVAN MCBROOM  Procedure(s) Performed: TRANSFORAMINAL LUMBAR INTERBODY FUSION (TLIF) WITH PEDICLE SCREW FIXATION 3 LEVEL, POSTERIOR SPINAL FUSION (N/A Back)  Patient location during evaluation: PACU Anesthesia Type: General Level of consciousness: awake and alert Pain management: pain level controlled Vital Signs Assessment: post-procedure vital signs reviewed and stable Respiratory status: spontaneous breathing, nonlabored ventilation, respiratory function stable and patient connected to nasal cannula oxygen Cardiovascular status: blood pressure returned to baseline and stable Postop Assessment: no apparent nausea or vomiting Anesthetic complications: no     Last Vitals:  Vitals:   01/10/19 0012 01/10/19 0406  BP: 118/63 121/68  Pulse: (!) 110 91  Resp: 19 19  Temp:  37 C  SpO2: 99% 100%    Last Pain:  Vitals:   01/10/19 0534  TempSrc:   PainSc: Asleep                 Alphonsus Sias

## 2019-01-10 NOTE — Progress Notes (Addendum)
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  POD1: She is recovering well.  Pain currently rated 6/10 mostly in the lumbar incision site.  Also complains of new right anterior calf numbness and persistent right lateral thigh numbness.  Catheter was removed this morning she has not yet urinated.  She has not yet received physical therapy or occupational therapy.  Drain output total 80 overnight.  POD0: Tolerated procedure well.  Patient was evaluated in postop recovery still disoriented from anesthesia.  No complaints at this time.  Physical Exam: Vitals:   01/10/19 1134 01/10/19 1723  BP: (!) 144/88 123/76  Pulse: 99 85  Resp: 18 18  Temp: 98.8 F (37.1 C) 98.6 F (37 C)  SpO2: 100% 99%   Strength: 5/5 throughout lower extremities bilaterally Sensation: Decreased sensation right anterior calf, anterior and lateral thigh   Data:  No results for input(s): NA, K, CL, CO2, BUN, CREATININE, LABGLOM, GLUCOSE, CALCIUM in the last 168 hours. No results for input(s): AST, ALT, ALKPHOS in the last 168 hours.  Invalid input(s): TBILI   No results for input(s): WBC, HGB, HCT, PLT in the last 168 hours. No results for input(s): APTT, INR in the last 168 hours.       Other tests/results:   DG lumbar spine 2-3 views 01/10/2019  COMPARISON:  01/09/2019 and prior exams  FINDINGS: Posterior rod, bipedicular screws at L4, L5 and S1, and S2/SI joint screws noted.  Interbody fusion hardware at L4-5 and L5-S1 noted.  Anterolisthesis of L5 on S1 is unchanged.  No gross complicating features are identified.  IMPRESSION: Posterior fusion from L4-S2 without definite complicating features. Unchanged anterolisthesis of L5 on S1.  Assessment/Plan:  [name] is POD1  status post L4-5 and L5-S1 TLIF.   Will continue to monitor.  - monitor drain output - mobilize - pain control - DVT prophylaxis - PTOT - Bracing  Marin Olp PA-C Department of Neurosurgery

## 2019-01-10 NOTE — TOC Initial Note (Addendum)
Transition of Care [facility]) - Initial/Assessment Note    Patient Details  Name: [name] MRN: [id number] Date of Birth: 04-27-1971  Transition of [facility]) CM/SW Contact:    Su Hilt, RN Phone Number: 01/10/2019, 9:19 AM  Clinical Narrative:                 Met with the patient to discuss DC plan and needs The patient will be going to [place] with her sister and stated that she does not need PT services.  She needs a RW, notified [employer], Sister will provide transportation at DC, can afford medications, uses [facility] [facility], sees Dr Altamease Oiler as PCP and is up to date with visits  Expected Discharge Plan: Home/Self Care Barriers to Discharge: Barriers Resolved   Patient Goals and CMS Choice Patient states their goals for this hospitalization and ongoing recovery are:: go home with her sister in [place]      Expected Discharge Plan and Services Expected Discharge Plan: Home/Self Care   Discharge Planning Services: CM Consult   Living arrangements for the past 2 months: Single Family Home Expected Discharge Date: 01/10/19               DME Arranged: N/A         HH Arranged: NA          Prior Living Arrangements/Services Living arrangements for the past 2 months: Single Family Home Lives with:: Self Patient language and need for interpreter reviewed:: No Do you feel safe going back to the place where you live?: Yes      Need for Family Participation in Patient Care: No (Comment) Care giver support system in place?: Yes (comment)   Criminal Activity/Legal Involvement Pertinent to Current Situation/Hospitalization: No - Comment as needed  Activities of Daily Living Home Assistive Devices/Equipment: CBG Meter, Cane (specify quad or straight)(straight) ADL Screening (condition at time of admission) Patient's cognitive ability adequate to safely complete daily activities?: Yes Is the patient deaf or have difficulty hearing?: No Does the patient have  difficulty seeing, even when wearing glasses/contacts?: No Does the patient have difficulty concentrating, remembering, or making decisions?: No Patient able to express need for assistance with ADLs?: Yes Does the patient have difficulty dressing or bathing?: Yes Independently performs ADLs?: No Communication: Independent Dressing (OT): Needs assistance Is this a change from baseline?: Change from baseline, expected to last <3days Grooming: Needs assistance Is this a change from baseline?: Change from baseline, expected to last <3 days Feeding: Independent Bathing: Needs assistance Is this a change from baseline?: Change from baseline, expected to last <3 days Toileting: Needs assistance Is this a change from baseline?: Change from baseline, expected to last <3 days In/Out Bed: Needs assistance Is this a change from baseline?: Change from baseline, expected to last <3 days Walks in Home: Needs assistance Is this a change from baseline?: Change from baseline, expected to last <3 days Does the patient have difficulty walking or climbing stairs?: Yes Weakness of Legs: Both Weakness of Arms/Hands: None  Permission Sought/Granted   Permission granted to share information with : Yes, Verbal Permission Granted              Emotional Assessment Appearance:: Appears stated age Attitude/Demeanor/Rapport: Engaged Affect (typically observed): Calm, Happy Orientation: : Oriented to Self, Oriented to Place, Oriented to  Time, Oriented to Situation Alcohol / Substance Use: Not Applicable Psych Involvement: No (comment)  Admission diagnosis:  M43.10 ANTEROLISTHESIS, M43.06 PARS DEFECT OF LUMBAR, M54.16 LUMBAR  RADICULOPATHY, M51.26 LUMBAR DISC HERNIATION Patient Active Problem List   Diagnosis Date Noted  . S/P lumbar fusion 01/09/2019  . Medication monitoring encounter 09/12/2018  . Ischemic stroke ([place]) 10/31/2017  . Visual field defect   . Complicated migraine   . Migraine with  visual aura   . Essential hypertension 06/01/2016  . Diabetes mellitus type 2 in obese ([place]) 06/01/2016  . Poorly controlled diabetes mellitus ([place]) 04/27/2015  . Hearing loss secondary to cerumen impaction 02/07/2013  . Smoker 03/07/2011  . Asthma 02/07/2008  . Male-to-male transgender person 10/31/2007  . Depression with anxiety 10/17/2007  . Human immunodeficiency virus (HIV) disease ([place]) 04/14/2006  . HIDRADENITIS SUPPURATIVA 04/14/2006   PCP:  Clent Demark, PA-C Pharmacy:   [place], [place] [address] Phone: [phone] Fax: [phone]     Social Determinants of Health (SDOH) Interventions    Readmission Risk Interventions No flowsheet data found.

## 2019-01-10 NOTE — Progress Notes (Signed)
Left drain removed without issue. Dressed with gauze and tape.  She is doing well. Notes fall against wall this morning after xray with some increased right hip/lateral back pain.

## 2019-01-10 NOTE — Plan of Care (Signed)
  Problem: Education: Goal: Ability to verbalize activity precautions or restrictions will improve 01/10/2019 0554 by Jearld Fenton, RN Outcome: Progressing 01/10/2019 0321 by Jearld Fenton, RN Outcome: Progressing Goal: Knowledge of the prescribed therapeutic regimen will improve 01/10/2019 0554 by Jearld Fenton, RN Outcome: Progressing 01/10/2019 0321 by Jearld Fenton, RN Outcome: Progressing Goal: Understanding of discharge needs will improve 01/10/2019 0554 by Jearld Fenton, RN Outcome: Progressing 01/10/2019 0321 by Jearld Fenton, RN Outcome: Progressing   Problem: Activity: Goal: Ability to avoid complications of mobility impairment will improve 01/10/2019 0554 by Jearld Fenton, RN Outcome: Progressing 01/10/2019 0321 by Jearld Fenton, RN Outcome: Progressing Goal: Ability to tolerate increased activity will improve 01/10/2019 0554 by Jearld Fenton, RN Outcome: Progressing 01/10/2019 0321 by Jearld Fenton, RN Outcome: Progressing Goal: Will remain free from falls 01/10/2019 0554 by Jearld Fenton, RN Outcome: Progressing 01/10/2019 0321 by Jearld Fenton, RN Outcome: Progressing   Problem: Bowel/Gastric: Goal: Gastrointestinal status for postoperative course will improve 01/10/2019 0554 by Jearld Fenton, RN Outcome: Progressing 01/10/2019 0321 by Jearld Fenton, RN Outcome: Progressing   Problem: Clinical Measurements: Goal: Ability to maintain clinical measurements within normal limits will improve 01/10/2019 0554 by Jearld Fenton, RN Outcome: Progressing 01/10/2019 0321 by Jearld Fenton, RN Outcome: Progressing Goal: Postoperative complications will be avoided or minimized 01/10/2019 0554 by Jearld Fenton, RN Outcome: Progressing 01/10/2019 0321 by Jearld Fenton, RN Outcome: Progressing Goal: Diagnostic test results will improve 01/10/2019 0554 by Jearld Fenton, RN Outcome: Progressing 01/10/2019 0321 by Jearld Fenton, RN Outcome:  Progressing   Problem: Pain Management: Goal: Pain level will decrease 01/10/2019 0554 by Jearld Fenton, RN Outcome: Progressing 01/10/2019 0321 by Jearld Fenton, RN Outcome: Progressing   Problem: Skin Integrity: Goal: Will show signs of wound healing 01/10/2019 0554 by Jearld Fenton, RN Outcome: Progressing 01/10/2019 0321 by Jearld Fenton, RN Outcome: Progressing   Problem: Health Behavior/Discharge Planning: Goal: Identification of resources available to assist in meeting health care needs will improve 01/10/2019 0554 by Jearld Fenton, RN Outcome: Progressing 01/10/2019 0321 by Jearld Fenton, RN Outcome: Progressing   Problem: Bladder/Genitourinary: Goal: Urinary functional status for postoperative course will improve 01/10/2019 0554 by Jearld Fenton, RN Outcome: Progressing 01/10/2019 0321 by Jearld Fenton, RN Outcome: Progressing

## 2019-01-10 NOTE — Plan of Care (Signed)

## 2019-01-10 NOTE — Evaluation (Signed)
Occupational Therapy Evaluation Patient Details Name: [name] OleaSteven A [name] MRN: [id number] DOB: [birth year] Today's Date: 01/10/2019    History of Present Illness [name] is a 48 y/o male s/p L4-S1 fusion 01/09/19.    Clinical Impression   Pt seen for OT evaluation this date, POD#1 from lumbar fusion. Prior to hospital admission, pt was independent with mobility, ADL, and IADL but increasingly more limited 2/2 back pain in all areas. 3 falls in past 12 months 2/2 RLE "giving out." Pt plans to stay with her sister in [place] who has a level entry home, walk in shower, and comfort height toilets. Pt reports sister is an Charity fundraiserN. Currently pt requires CGA to Min A for LB ADL in order to maintain precautions. Pt educated in back precautions with handout provided, self care skills, home/routines modifications, bed mobility and functional transfer training, AE/DME for bathing, dressing, and toileting needs, and falls prevention strategies to maximize safety and functional independence while minimizing falls risk and maintaining precautions. Pt verbalized understanding of all education/training provided. Handout provided to support recall and carry over of learned precautions/techniques for bed mobility, functional transfers, and self care skills. No additional skilled OT needs at this time or at discharge; pt in agreement. Will discharge in house.     Follow Up Recommendations  No OT follow up    Equipment Recommendations  Tub/shower seat;Other (comment)(LH sponge, LH shoe horn, reacher, toileting aide)    Recommendations for Other Services       Precautions / Restrictions Precautions Precautions: Back Precaution Booklet Issued: Yes (comment) Precaution Comments: no bending, lifting, twisting, arching Required Braces or Orthoses: Spinal Brace Spinal Brace: (apply sitting, off to shower, off in bed) Restrictions Weight Bearing Restrictions: No      Mobility Bed Mobility               General  bed mobility comments: Pt in recliner on arrival, reports she did not need help getting up to sitting  Transfers Overall transfer level: Independent Equipment used: Rolling walker (2 wheeled)             General transfer comment: Pt able to maintain neutral spine and used UEs appropriate to rise and return to sitting    Balance Overall balance assessment: Modified Independent                                         ADL either performed or assessed with clinical judgement   ADL                                         General ADL Comments: Pt requires Min-Mod A for LB ADL when not using AE to assist; max a for compression stockings     Vision Baseline Vision/History: Wears glasses Wears Glasses: Reading only Patient Visual Report: No change from baseline       Perception     Praxis      Pertinent Vitals/Pain Pain Assessment: 0-10 Pain Score: 7  Pain Location: low back, minimal numbness in R LE that is better than presurgery Pain Intervention(s): Limited activity within patient's tolerance;Monitored during session     Hand Dominance Left   Extremity/Trunk Assessment Upper Extremity Assessment Upper Extremity Assessment: Overall WFL for tasks assessed   Lower Extremity Assessment Lower Extremity Assessment:  Defer to PT evaluation;RLE deficits/detail RLE Deficits / Details: ankle DF most limited, R hip and knee grossly 3+/5, decreased sensation to lateral aspect of R thigh only RLE Sensation: decreased light touch   Cervical / Trunk Assessment Cervical / Trunk Assessment: Normal   Communication Communication Communication: No difficulties   Cognition Arousal/Alertness: Awake/alert Behavior During Therapy: WFL for tasks assessed/performed Overall Cognitive Status: Within Functional Limits for tasks assessed                                     General Comments       Exercises Other Exercises Other  Exercises: pt instructed in back precautions and how to implement during all ADL tasks, home/routines modifications, AE/DME, falls prevention, compression stocking mgt, and brace mgt; handout provided   Shoulder Instructions      Home Living Family/patient expects to be discharged to:: Private residence Living Arrangements: Other relatives;Non-relatives/Friends(pt lives with sister and 2 roommates) Available Help at Discharge: Family(pt plans to stay with another sister in [place]) Type of Home: House Home Access: Level entry     Home Layout: Multi-level;Able to live on main level with bedroom/bathroom     Bathroom Shower/Tub: Occupational psychologist: Handicapped height         Additional Comments: Pt to be staying with sister (in [place]) where she does not have to negotiate steps to enter to inside, someone will be available to assist essentially 24/7. Sister is an Therapist, sports      Prior Functioning/Environment          Comments: Recently pt has been unable to do more than very limited in-home ambulation for may months now and very limited with ADL. 3 falls in past 12 months 2/2 RLE "giving out"        OT Problem List: Decreased strength      OT Treatment/Interventions:      OT Goals(Current goals can be found in the care plan section) Acute Rehab OT Goals Patient Stated Goal: get back to independent PLOF OT Goal Formulation: All assessment and education complete, DC therapy  OT Frequency:     Barriers to D/C:            Co-evaluation              AM-PAC OT "6 Clicks" Daily Activity     Outcome Measure Help from another person eating meals?: None Help from another person taking care of personal grooming?: None Help from another person toileting, which includes using toliet, bedpan, or urinal?: A Little Help from another person bathing (including washing, rinsing, drying)?: A Little Help from another person to put on and taking off regular upper body clothing?:  None Help from another person to put on and taking off regular lower body clothing?: A Little 6 Click Score: 21   End of Session    Activity Tolerance: Patient tolerated treatment well Patient left: in chair;with call bell/phone within reach;with chair alarm set  OT Visit Diagnosis: Other abnormalities of gait and mobility (R26.89)                Time: 7371-0626 OT Time Calculation (min): 38 min Charges:  OT General Charges $OT Visit: 1 Visit OT Evaluation $OT Eval Low Complexity: 1 Low OT Treatments $Self Care/Home Management : 23-37 mins  Jeni Salles, MPH, MS, OTR/L ascom [phone] 01/10/19, 1:27 PM

## 2019-01-10 NOTE — Evaluation (Signed)
Physical Therapy Evaluation Patient Details Name: [name] MRN: [id number] DOB: 1971/04/07 Today's Date: 01/10/2019   History of Present Illness  [name] MoJessica [name] is a 48 y/o male s/p L4-S1 fusion 01/09/19.   Clinical Impression  Pt did well with PT exam and was able to circumambulate the nurses' station X2, which is farther than she has been able to walk in many months.  She reports pain but repeatedly states the sentiment that: It's a different, surgical, pain that I know will go away - unlike the pain before surgery.  Pt very eager to participate and showed good understanding for precautions and for HEP.      Follow Up Recommendations Follow surgeon's recommendation for DC plan and follow-up therapies    Equipment Recommendations  Rolling walker with 5" wheels    Recommendations for Other Services       Precautions / Restrictions Precautions Precautions: Back Precaution Booklet Issued: Yes (comment) Required Braces or Orthoses: Spinal Brace Restrictions Weight Bearing Restrictions: No      Mobility  Bed Mobility               General bed mobility comments: Pt in recliner on arrival, reports she did not need help getting up to sitting  Transfers Overall transfer level: Independent Equipment used: Rolling walker (2 wheeled)             General transfer comment: Pt able to maintian neutral spine and used UEs appropriate to rise and return to sitting  Ambulation/Gait Ambulation/Gait assistance: Supervision Gait Distance (Feet): 400 Feet Assistive device: Rolling walker (2 wheeled)       General Gait Details: Pt initially with some reliance on walker, but once she was warmed up and feeling more confident did not need to rely heavily on walker.  She was able to walk ~20 ft with no AD, but was safer, more efficient and more confident with AD  Stairs            Wheelchair Mobility    Modified Rankin (Stroke Patients Only)       Balance Overall  balance assessment: Modified Independent                                           Pertinent Vitals/Pain Pain Assessment: 0-10 Pain Score: 6  Pain Location: low back, minimal numbness in R LE that is better than presurgery    Home Living Family/patient expects to be discharged to:: Private residence                 Additional Comments: Pt to be staying with sister (in [place]) where she does not have to negotiate steps to enter to inside, someone will be available to assit essentially 24/7     Prior Function           Comments: Recently pt has been unable to do more than very limited in-home ambulation for may months now      Hand Dominance        Extremity/Trunk Assessment   Upper Extremity Assessment Upper Extremity Assessment: Overall WFL for tasks assessed    Lower Extremity Assessment Lower Extremity Assessment: RLE deficits/detail(L grossly 4-/5) RLE Deficits / Details: ankle DF most limited, R hip and knee grossly 3+/5 RLE Sensation: decreased light touch       Communication   Communication: No difficulties  Cognition Arousal/Alertness: Awake/alert Behavior  During Therapy: WFL for tasks assessed/performed Overall Cognitive Status: Within Functional Limits for tasks assessed                                        General Comments      Exercises     Assessment/Plan    PT Assessment Patient needs continued PT services  PT Problem List Decreased strength;Decreased activity tolerance;Decreased balance;Decreased mobility;Decreased knowledge of use of DME;Decreased safety awareness;Pain       PT Treatment Interventions DME instruction;Gait training;Stair training;Functional mobility training;Therapeutic activities;Therapeutic exercise;Balance training;Neuromuscular re-education;Patient/family education    PT Goals (Current goals can be found in the Care Plan section)  Acute Rehab PT Goals Patient Stated Goal: get  back to independent PLOF PT Goal Formulation: With patient Time For Goal Achievement: 01/24/19 Potential to Achieve Goals: Good    Frequency 7X/week   Barriers to discharge        Co-evaluation               AM-PAC PT "6 Clicks" Mobility  Outcome Measure Help needed turning from your back to your side while in a flat bed without using bedrails?: None Help needed moving from lying on your back to sitting on the side of a flat bed without using bedrails?: None Help needed moving to and from a bed to a chair (including a wheelchair)?: None Help needed standing up from a chair using your arms (e.g., wheelchair or bedside chair)?: None Help needed to walk in hospital room?: None Help needed climbing 3-5 steps with a railing? : A Little 6 Click Score: 23    End of Session Equipment Utilized During Treatment: Gait belt Activity Tolerance: Patient tolerated treatment well Patient left: with chair alarm set;with call bell/phone within reach Nurse Communication: Mobility status      Time: 1572-6203 PT Time Calculation (min) (ACUTE ONLY): 37 min   Charges:   PT Evaluation $PT Eval Low Complexity: 1 Low PT Treatments $Gait Training: 8-22 mins        [name], DPT 01/10/2019, 12:26 PM

## 2019-01-11 LAB — GLUCOSE, CAPILLARY
Glucose-Capillary: 152 mg/dL — ABNORMAL HIGH (ref 70–99)
Glucose-Capillary: 189 mg/dL — ABNORMAL HIGH (ref 70–99)

## 2019-01-11 MED ORDER — OXYCODONE HCL 10 MG PO TABS: 5.0000 mg | ORAL_TABLET | ORAL | 0 refills | Status: DC | PRN

## 2019-01-11 MED ORDER — BISACODYL 10 MG RE SUPP: 10.0000 mg | Freq: Once | RECTAL | Status: DC | PRN

## 2019-01-11 MED ORDER — CELECOXIB 200 MG PO CAPS: 200.0000 mg | ORAL_CAPSULE | Freq: Two times a day (BID) | ORAL | 0 refills | Status: DC

## 2019-01-11 MED ORDER — METHOCARBAMOL 500 MG PO TABS: 500.0000 mg | ORAL_TABLET | Freq: Four times a day (QID) | ORAL | 0 refills | Status: DC

## 2019-01-11 MED ORDER — MAGNESIUM HYDROXIDE 400 MG/5ML PO SUSP
30.0000 mL | Freq: Once | ORAL | Status: AC
  Administered 2019-01-11: 30 mL via ORAL
  Filled 2019-01-11: qty 30

## 2019-01-11 MED ORDER — OXYCODONE HCL 10 MG PO TABS: 5.0000 mg | ORAL_TABLET | ORAL | 0 refills | Status: AC | PRN

## 2019-01-11 MED FILL — METHOCARBAMOL 500 MG TABS: 500 | 8 days supply | Qty: 90 | Fill #0

## 2019-01-11 MED FILL — CELECOXIB 200 MG CAPSULE: 200 | 30 days supply | Qty: 60 | Fill #0

## 2019-01-11 NOTE — TOC Transition Note (Signed)
Transition of Care [facility]) - CM/SW Discharge Note   Patient Details  Name: LARONE KLIETHERMES MRN: [id number] Date of Birth: 1971/05/13  Transition of Care [facility]) CM/SW Contact:  Su Hilt, RN Phone Number: 01/11/2019, 12:58 PM   Clinical Narrative:    Discharged with sister to go home with sister in [place], refused [facility] services but was provided a RW for home before leaving, no other needs   Final next level of care: Home/Self Care Barriers to Discharge: Barriers Resolved   Patient Goals and CMS Choice Patient states their goals for this hospitalization and ongoing recovery are:: go home with her sister in [place]      Discharge Placement                       Discharge Plan and Services   Discharge Planning Services: CM Consult            DME Arranged: Gilford Rile rolling DME Agency: [employer] Date DME Agency Contacted: 01/10/19 Time DME Agency Contacted: 1520 Representative spoke with at DME Agency: Leroy [facility] HH Arranged: NA          Social Determinants of Health ([place]) Interventions     Readmission Risk Interventions No flowsheet data found.

## 2019-01-11 NOTE — Progress Notes (Signed)
Physical Therapy Treatment Patient Details Name: TAO SATZ MRN: [id number] DOB: July 15, 1970 Today's Date: 01/11/2019    History of Present Illness Abdulhadi Stopa is a 48 y/o male s/p L4-S1 fusion 01/09/19.     PT Comments    Pt again eager to ambulate and did well with the effort, ~75 ft w/o AD, able to negotiate up/down steps w/o assist, reports having tried exercises w/o issue and when asked had no questions or concerns related to these. No increased pain or fatigue with the effort. All questions answered and cleared pt for d/c from PT perspective.     Follow Up Recommendations        Equipment Recommendations  Rolling walker with 5" wheels    Recommendations for Other Services       Precautions / Restrictions Precautions Precautions: Back Precaution Booklet Issued: Yes (comment) Precaution Comments: no bending, lifting, twisting, arching Required Braces or Orthoses: Spinal Brace Restrictions Weight Bearing Restrictions: No    Mobility  Bed Mobility Overal bed mobility: Modified Independent             General bed mobility comments: Again in recliner, reports she was able to maintain spinal neutral and get to sitting w/o issue  Transfers Overall transfer level: Independent               General transfer comment: Pt able to maintain neutral spine and used UEs appropriate to rise and return to sitting  Ambulation/Gait Ambulation/Gait assistance: Supervision Gait Distance (Feet): 250 Feet Assistive device: Rolling walker (2 wheeled)       General Gait Details: Again initially more reliant on walker, but gradually had less reliance on walker and did final 75 ft w/o AD, though slower and more guarded than with walker.  No LOBs, excessive fatigue or safety issues.   Stairs Stairs: Yes Stairs assistance: Modified independent (Device/Increase time) Stair Management: Two rails Number of Stairs: 4 General stair comments: PT able to negotiate up/down  steps w/o assist.  Reciprocal pattern going up, step-to leading with R descending   Wheelchair Mobility    Modified Rankin (Stroke Patients Only)       Balance Overall balance assessment: Modified Independent                                          Cognition Arousal/Alertness: Awake/alert Behavior During Therapy: WFL for tasks assessed/performed Overall Cognitive Status: Within Functional Limits for tasks assessed                                        Exercises      General Comments        Pertinent Vitals/Pain Pain Assessment: 0-10 Pain Score: 3     Home Living                      Prior Function            PT Goals (current goals can now be found in the care plan section) Progress towards PT goals: Progressing toward goals    Frequency    7X/week      PT Plan Current plan remains appropriate    Co-evaluation              AM-PAC PT "6 Clicks" Mobility  Outcome Measure  Help needed turning from your back to your side while in a flat bed without using bedrails?: None Help needed moving from lying on your back to sitting on the side of a flat bed without using bedrails?: None Help needed moving to and from a bed to a chair (including a wheelchair)?: None Help needed standing up from a chair using your arms (e.g., wheelchair or bedside chair)?: None Help needed to walk in hospital room?: None Help needed climbing 3-5 steps with a railing? : A Little 6 Click Score: 23    End of Session Equipment Utilized During Treatment: Gait belt Activity Tolerance: Patient tolerated treatment well Patient left: with chair alarm set;with call bell/phone within reach Nurse Communication: Mobility status PT Visit Diagnosis: Muscle weakness (generalized) (M62.81);Difficulty in walking, not elsewhere classified (R26.2)     Time: [id number] PT Time Calculation (min) (ACUTE ONLY): 16 min  Charges:  $Gait Training:  8-22 mins                     Malachi ProGalen R Isaiha Asare, DPT 01/11/2019, 1:28 PM

## 2019-01-11 NOTE — Progress Notes (Signed)
Sister will be picking up pt on DC.

## 2019-01-11 NOTE — Progress Notes (Signed)
Remaining drain pulled without issue. Output measured at approximately 55.

## 2019-01-11 NOTE — Discharge Summary (Addendum)
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  POD2: Continues to recover well. Didn't sleep very well last night because of the discomfort of bed. Drain output overnight less than 80. Awaiting formal measurements. Pain remains adequately controlled with current pain regimen. Numbness in right lower extremity persists. Voiding without issue. Had BM this sfternoon. Passing gas.   Update: drain pulled without issue. She was able to get a nap in and feels better overall. Still awaiting BM.  POD1: She is recovering well.  Pain currently rated 6/10 mostly in the lumbar incision site.  Also complains of new right anterior calf numbness and persistent right lateral thigh numbness.  Catheter was removed this morning she has not yet urinated.  She has not yet received physical therapy or occupational therapy.  Drain output total 80 overnight.  POD0: Tolerated procedure well.  Patient was evaluated in postop recovery still disoriented from anesthesia.  No complaints at this time.  Physical Exam: Vitals:   01/11/19 0323 01/11/19 0747  BP: 121/64 116/73  Pulse: 78 79  Resp: 20 18  Temp: 98.2 F (36.8 C) 98.1 F (36.7 C)  SpO2: 96% 100%   Strength: 5/5 throughout lower extremities bilaterally Sensation: Decreased sensation right lateral thigh. Skin: Staple in tact. No bleeding, drainage, mild tenderness.   Data:  No results for input(s): NA, K, CL, CO2, BUN, CREATININE, LABGLOM, GLUCOSE, CALCIUM in the last 168 hours. No results for input(s): AST, ALT, ALKPHOS in the last 168 hours.  Invalid input(s): TBILI   No results for input(s): WBC, HGB, HCT, PLT in the last 168 hours. No results for input(s): APTT, INR in the last 168 hours.       Other tests/results:   DG lumbar spine 2-3 views 01/10/2019  COMPARISON:   01/09/2019 and prior exams  FINDINGS: Posterior rod, bipedicular screws at L4, L5 and S1, and S2/SI joint screws noted.  Interbody fusion hardware at L4-5 and L5-S1 noted.  Anterolisthesis of L5 on S1 is unchanged.  No gross complicating features are identified.  IMPRESSION: Posterior fusion from L4-S2 without definite complicating features. Unchanged anterolisthesis of L5 on S1.  Assessment/Plan:  [name] is POD 2 status post L4-5 and L5-S1 TLIF. Pain adequately controlled with current regimen. Will continue post op pain control with tylenol, robaxin, oxycodone, and celebrex as needed. Ambulating and voiding without issue. Had BM. Dressings removed from incision site and dressed with light gauze. Staples intact. Discussed wound care and physical activity restrictions. Home walker order completed.  She is scheduled to follow up in approximately 2 weeks to monitor progress. Advised to contact office if any questions or concerns arise.    Marin Olp PA-C Department of Neurosurgery

## 2019-01-11 NOTE — Progress Notes (Signed)
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  POD2: Continues to recover well. Didn't sleep very well last night because of the discomfort of bed. Drain output overnight less than 80. Awaiting formal measurements. Pain remains adequately controlled with current pain regimen. Numbness in right lower extremity persists. Voiding without issue. Appetite has decreased because she feels she needs to have BM. Passing gas.   POD1: She is recovering well.  Pain currently rated 6/10 mostly in the lumbar incision site.  Also complains of new right anterior calf numbness and persistent right lateral thigh numbness.  Catheter was removed this morning she has not yet urinated.  She has not yet received physical therapy or occupational therapy.  Drain output total 80 overnight.  POD0: Tolerated procedure well.  Patient was evaluated in postop recovery still disoriented from anesthesia.  No complaints at this time.  Physical Exam: Vitals:   01/11/19 0323 01/11/19 0747  BP: 121/64 116/73  Pulse: 78 79  Resp: 20 18  Temp: 98.2 F (36.8 C) 98.1 F (36.7 C)  SpO2: 96% 100%   Strength: 5/5 throughout lower extremities bilaterally Sensation: Decreased sensation right anterior calf, anterior and lateral thigh Skin: Mild blooding drainage noted on lumbar dressings. No active bleeding at drain insertion site.  Data:  No results for input(s): NA, K, CL, CO2, BUN, CREATININE, LABGLOM, GLUCOSE, CALCIUM in the last 168 hours. No results for input(s): AST, ALT, ALKPHOS in the last 168 hours.  Invalid input(s): TBILI   No results for input(s): WBC, HGB, HCT, PLT in the last 168 hours. No results for input(s): APTT, INR in the last 168 hours.       Other tests/results:   DG lumbar spine 2-3 views 01/10/2019  COMPARISON:   01/09/2019 and prior exams  FINDINGS: Posterior rod, bipedicular screws at L4, L5 and S1, and S2/SI joint screws noted.  Interbody fusion hardware at L4-5 and L5-S1 noted.  Anterolisthesis of L5 on S1 is unchanged.  No gross complicating features are identified.  IMPRESSION: Posterior fusion from L4-S2 without definite complicating features. Unchanged anterolisthesis of L5 on S1.  Assessment/Plan:  DELRICO MINEHART is POD 2 status post L4-5 and L5-S1 TLIF.  Will continue to monitor.  - monitor drain output - mobilize - pain control - DVT prophylaxis - PTOT - BM (milk of mag)  Marin Olp PA-C Department of Neurosurgery

## 2019-01-11 NOTE — Discharge Instructions (Signed)
Your surgeon has performed an operation on your lumbar spine (low back) to fuse two or more of the vertebrae (bones) together. This procedure is performed to treat a number of different spinal problems, including narrowing of the spinal canal (stenosis), herniated discs, degenerative changes, and injuries.  ° °Many times, patients feel better immediately after surgery and can "overdo it." Even if you feel well, it is important that you follow these activity guidelines. If you do not let your back heal properly from the surgery, you can increase the chance of return of your symptoms and other complications. The following are instructions to help in your recovery once you have been discharged from the hospital.  ° °* Do not take anti-inflammatory medications for 3 months after surgery (naproxen [Aleve], ibuprofen [Advil, Motrin], etc.). These medications can prevent your bones from healing properly.  ° °Activity  °   °No bending, lifting, or twisting ("BLT"). Avoid lifting objects heavier than 10 pounds (gallon milk jug).  Where possible, avoid household activities that involve lifting, bending, reaching, pushing, or pulling such as laundry, vacuuming, grocery shopping, and childcare. Try to arrange for help from friends and family for these activities while your back heals.  ° °Increase physical activity slowly as tolerated.  Taking short walks is encouraged, but avoid strenuous exercise. Do not jog, run, bicycle, lift weights, or participate in any other exercises unless specifically allowed by your doctor. Avoid prolonged sitting, including car rides.  ° °Talk to your doctor before resuming sexual activity.  ° °You should not drive until cleared by your doctor.  ° °Until released by your doctor, you should not return to work or school.  You should rest at home and let your body heal.  ° °You may shower three days after your surgery.  After showering, lightly dab your incision dry. Do not take a tub bath or go  swimming until approved by your doctor at your follow-up appointment.  ° °If your doctor ordered a lumbar brace for you, you should wear it whenever you are out of bed. You may remove it when lying down or sleeping. You should also wear it when riding in a car. Not all back surgeries require a lumbar brace.  ° °If you smoke, we strongly recommend that you quit.  Smoking has been proven to interfere with normal bone healing and will dramatically reduce the success rate of your surgery. Please contact [facility] (800-QUIT-NOW) and use the resources at www.[facility].com for assistance in stopping smoking.  ° °Surgical Incision  ° °If you have a dressing on your incision, you may remove it two days after your surgery. Keep your incision area clean and dry.  ° °If you have staples or stitches on your incision, you should have a follow up scheduled for removal. If you do not have staples or stitches, you will have steri-strips (small pieces of surgical tape) or Dermabond glue. The steri-strips/glue should begin to peel away within about a week (it is fine if the steri-strips fall off before then). If the strips are still in place one week after your surgery, you may gently remove them.  ° °Diet          ° ° You may return to your usual diet. Be sure to stay hydrated.  ° °When to Contact Us  ° °Although your surgery and recovery will likely be uneventful, you may have some residual numbness, aches, and pains in your back and/or legs. This is normal and should improve in   the next few weeks.  ° °However, should you experience any of the following, contact us immediately:  ° - New numbness or weakness  ° - Pain that is progressively getting worse, and is not relieved by your pain medications or rest  ° - Bleeding, redness, swelling, pain, or drainage from surgical incision  ° - Chills or flu-like symptoms  ° - Fever greater than 101.0 F (38.3 C)  ° - Problems with bowel or bladder functions  ° - Difficulty breathing or  shortness of breath  ° - Warmth, tenderness, or swelling in your calf  °Contact Information  ° - During office hours (Monday-Friday 9 am to 5 pm), please call your physician at [phone] ([place])  ° - After hours and weekends, please call the [facility] Operator at [phone] and ask for the Neurosurgery Resident On Call  ° - For a life-threatening emergency, call 911  ° °

## 2019-01-15 ENCOUNTER — Encounter: Payer: Self-pay | Admitting: Neurosurgery

## 2019-01-24 MED FILL — oxyCODONE HCL 5 MG TABS: 5 | 5 days supply | Qty: 60 | Fill #0

## 2019-01-24 MED FILL — METHOCARBAMOL 500 MG TABS: 500 | 12 days supply | Qty: 90 | Fill #0

## 2019-02-04 MED FILL — ESCITALOPRAM 20 MG TABLET: 20 | 30 days supply | Qty: 30 | Fill #2

## 2019-02-04 MED FILL — LANTUS SOLOSTAR 100 UNITS/M: 100 | 37 days supply | Qty: 15 | Fill #1

## 2019-02-04 MED FILL — BIKTARVY 50-200-25 MG TABS: 50-200-25 | 30 days supply | Qty: 30 | Fill #5

## 2019-02-04 MED FILL — SPIRONOLACTONE 50 MG TABS: 50 | 30 days supply | Qty: 30 | Fill #3

## 2019-02-07 DIAGNOSIS — Z981 Arthrodesis status: Secondary | ICD-10-CM | POA: Diagnosis not present

## 2019-02-07 DIAGNOSIS — M4317 Spondylolisthesis, lumbosacral region: Secondary | ICD-10-CM | POA: Diagnosis not present

## 2019-02-07 DIAGNOSIS — M4310 Spondylolisthesis, site unspecified: Secondary | ICD-10-CM | POA: Diagnosis not present

## 2019-02-07 DIAGNOSIS — M4306 Spondylolysis, lumbar region: Secondary | ICD-10-CM | POA: Diagnosis not present

## 2019-02-07 MED FILL — CEPHALEXIN 500 MG CAPSULE: 500 | 7 days supply | Qty: 28 | Fill #0

## 2019-02-07 MED FILL — METHOCARBAMOL 500 MG TABS: 500 | 22 days supply | Qty: 180 | Fill #0

## 2019-02-07 MED FILL — oxyCODONE HCL 5 MG TABS: 5 | 7 days supply | Qty: 84 | Fill #0

## 2019-02-07 MED FILL — GABAPENTIN 300 MG CAPSULE: 300 | 30 days supply | Qty: 90 | Fill #0

## 2019-02-19 MED FILL — oxyCODONE HCL 5 MG TABS: 5 | 3 days supply | Qty: 36 | Fill #1

## 2019-02-28 ENCOUNTER — Other Ambulatory Visit: Payer: Self-pay | Admitting: Infectious Diseases

## 2019-03-06 MED FILL — METHOCARBAMOL 500 MG TABS: 500 | 30 days supply | Qty: 180 | Fill #0

## 2019-03-07 MED FILL — oxyCODONE HCL 5 MG TABS: 5 | 30 days supply | Qty: 120 | Fill #0

## 2019-03-07 MED FILL — BIKTARVY 50-200-25 MG TABS: 50-200-25 | 30 days supply | Qty: 30 | Fill #0

## 2019-03-21 DIAGNOSIS — M5416 Radiculopathy, lumbar region: Secondary | ICD-10-CM | POA: Diagnosis not present

## 2019-03-21 DIAGNOSIS — M4326 Fusion of spine, lumbar region: Secondary | ICD-10-CM | POA: Diagnosis not present

## 2019-03-22 MED FILL — GABAPENTIN 300 MG CAPSULE: 300 | 30 days supply | Qty: 90 | Fill #1

## 2019-04-02 MED FILL — oxyCODONE HCL 5 MG TABS: 5 | 10 days supply | Qty: 120 | Fill #0

## 2019-04-02 MED FILL — METHOCARBAMOL 500 MG TABS: 500 | 30 days supply | Qty: 180 | Fill #1

## 2019-04-03 MED FILL — BIKTARVY 50-200-25 MG TABS: 50-200-25 | 30 days supply | Qty: 30 | Fill #1

## 2019-04-10 ENCOUNTER — Other Ambulatory Visit (INDEPENDENT_AMBULATORY_CARE_PROVIDER_SITE_OTHER): Payer: Self-pay | Admitting: Primary Care

## 2019-04-10 MED ORDER — METFORMIN HCL 1000 MG PO TABS: 1000.0000 mg | ORAL_TABLET | Freq: Two times a day (BID) | ORAL | 0 refills | Status: DC

## 2019-04-10 MED FILL — metFORMIN HCL 1000 MG TABS: 1000 | 30 days supply | Qty: 60 | Fill #0

## 2019-04-10 MED FILL — LANTUS SOLOSTAR 100 UNITS/M: 100 | 37 days supply | Qty: 15 | Fill #2

## 2019-04-10 MED FILL — UNIFINE PENTIPS 8MM 31G: 31G X 8 MM | 100 days supply | Qty: 100 | Fill #1

## 2019-04-12 MED FILL — GABAPENTIN 300 MG CAPSULE: 300 | 30 days supply | Qty: 150 | Fill #0

## 2019-04-26 ENCOUNTER — Ambulatory Visit (INDEPENDENT_AMBULATORY_CARE_PROVIDER_SITE_OTHER): Payer: Medicaid Other | Admitting: Primary Care

## 2019-04-26 ENCOUNTER — Encounter (INDEPENDENT_AMBULATORY_CARE_PROVIDER_SITE_OTHER): Payer: Self-pay | Admitting: Primary Care

## 2019-04-26 ENCOUNTER — Other Ambulatory Visit: Payer: Self-pay

## 2019-04-26 VITALS — BP 127/87 | HR 105 | Temp 96.6°F | Ht 71.0 in | Wt 243.4 lb

## 2019-04-26 DIAGNOSIS — F321 Major depressive disorder, single episode, moderate: Secondary | ICD-10-CM | POA: Diagnosis not present

## 2019-04-26 DIAGNOSIS — N5082 Scrotal pain: Secondary | ICD-10-CM | POA: Diagnosis not present

## 2019-04-26 DIAGNOSIS — R5383 Other fatigue: Secondary | ICD-10-CM | POA: Diagnosis not present

## 2019-04-26 DIAGNOSIS — R5381 Other malaise: Secondary | ICD-10-CM | POA: Diagnosis not present

## 2019-04-26 DIAGNOSIS — Z794 Long term (current) use of insulin: Secondary | ICD-10-CM | POA: Diagnosis not present

## 2019-04-26 DIAGNOSIS — E119 Type 2 diabetes mellitus without complications: Secondary | ICD-10-CM | POA: Diagnosis not present

## 2019-04-26 DIAGNOSIS — Z01818 Encounter for other preprocedural examination: Secondary | ICD-10-CM

## 2019-04-26 DIAGNOSIS — Z23 Encounter for immunization: Secondary | ICD-10-CM

## 2019-04-26 LAB — GLUCOSE, POCT (MANUAL RESULT ENTRY): POC Glucose: 200 mg/dl — AB (ref 70–99)

## 2019-04-26 LAB — POCT GLYCOSYLATED HEMOGLOBIN (HGB A1C): Hemoglobin A1C: 7.5 % — AB (ref 4.0–5.6)

## 2019-04-26 MED ORDER — PEN NEEDLES 31G X 8 MM MISC: 1.0000 | Freq: Every day | 1 refills | Status: DC

## 2019-04-26 MED ORDER — LANTUS SOLOSTAR 100 UNIT/ML ~~LOC~~ SOPN: 40.0000 [IU] | PEN_INJECTOR | Freq: Every day | SUBCUTANEOUS | 3 refills | Status: DC

## 2019-04-26 MED ORDER — MELATONIN 5 MG PO CAPS: 5.0000 mg | ORAL_CAPSULE | Freq: Every evening | ORAL | 3 refills | Status: DC | PRN

## 2019-04-26 MED ORDER — METFORMIN HCL 1000 MG PO TABS: 1000.0000 mg | ORAL_TABLET | Freq: Two times a day (BID) | ORAL | 3 refills | Status: DC

## 2019-04-26 MED ORDER — ESCITALOPRAM OXALATE 20 MG PO TABS: 20.0000 mg | ORAL_TABLET | Freq: Every day | ORAL | 3 refills | Status: DC

## 2019-04-26 MED ORDER — GLIMEPIRIDE 4 MG PO TABS: 4.0000 mg | ORAL_TABLET | Freq: Every day | ORAL | 0 refills | Status: DC

## 2019-04-26 MED FILL — ESCITALOPRAM 20 MG TABLET: 20 | 30 days supply | Qty: 30 | Fill #0

## 2019-04-26 MED FILL — GLIMEPIRIDE 4 MG TABLET: 4 | 90 days supply | Qty: 90 | Fill #0

## 2019-04-26 NOTE — Patient Instructions (Signed)
Diabetes Mellitus and Exercise Exercising regularly is important for your overall health, especially when you have diabetes (diabetes mellitus). Exercising is not only about losing weight. It has many other health benefits, such as increasing muscle strength and bone density and reducing body fat and stress. This leads to improved fitness, flexibility, and endurance, all of which result in better overall health. Exercise has additional benefits for people with diabetes, including:  Reducing appetite.  Helping to lower and control blood glucose.  Lowering blood pressure.  Helping to control amounts of fatty substances (lipids) in the blood, such as cholesterol and triglycerides.  Helping the body to respond better to insulin (improving insulin sensitivity).  Reducing how much insulin the body needs.  Decreasing the risk for heart disease by: ? Lowering cholesterol and triglyceride levels. ? Increasing the levels of good cholesterol. ? Lowering blood glucose levels. What is my activity plan? Your health care provider or certified diabetes educator can help you make a plan for the type and frequency of exercise (activity plan) that works for you. Make sure that you:  Do at least 150 minutes of moderate-intensity or vigorous-intensity exercise each week. This could be brisk walking, biking, or water aerobics. ? Do stretching and strength exercises, such as yoga or weightlifting, at least 2 times a week. ? Spread out your activity over at least 3 days of the week.  Get some form of physical activity every day. ? Do not go more than 2 days in a row without some kind of physical activity. ? Avoid being inactive for more than 30 minutes at a time. Take frequent breaks to walk or stretch.  Choose a type of exercise or activity that you enjoy, and set realistic goals.  Start slowly, and gradually increase the intensity of your exercise over time. What do I need to know about managing my  diabetes?   Check your blood glucose before and after exercising. ? If your blood glucose is 240 mg/dL (13.3 mmol/L) or higher before you exercise, check your urine for ketones. If you have ketones in your urine, do not exercise until your blood glucose returns to normal. ? If your blood glucose is 100 mg/dL (5.6 mmol/L) or lower, eat a snack containing 15-20 grams of carbohydrate. Check your blood glucose 15 minutes after the snack to make sure that your level is above 100 mg/dL (5.6 mmol/L) before you start your exercise.  Know the symptoms of low blood glucose (hypoglycemia) and how to treat it. Your risk for hypoglycemia increases during and after exercise. Common symptoms of hypoglycemia can include: ? Hunger. ? Anxiety. ? Sweating and feeling clammy. ? Confusion. ? Dizziness or feeling light-headed. ? Increased heart rate or palpitations. ? Blurry vision. ? Tingling or numbness around the mouth, lips, or tongue. ? Tremors or shakes. ? Irritability.  Keep a rapid-acting carbohydrate snack available before, during, and after exercise to help prevent or treat hypoglycemia.  Avoid injecting insulin into areas of the body that are going to be exercised. For example, avoid injecting insulin into: ? The arms, when playing tennis. ? The legs, when jogging.  Keep records of your exercise habits. Doing this can help you and your health care provider adjust your diabetes management plan as needed. Write down: ? Food that you eat before and after you exercise. ? Blood glucose levels before and after you exercise. ? The type and amount of exercise you have done. ? When your insulin is expected to peak, if you use   insulin. Avoid exercising at times when your insulin is peaking.  When you start a new exercise or activity, work with your health care provider to make sure the activity is safe for you, and to adjust your insulin, medicines, or food intake as needed.  Drink plenty of water while  you exercise to prevent dehydration or heat stroke. Drink enough fluid to keep your urine clear or pale yellow. Summary  Exercising regularly is important for your overall health, especially when you have diabetes (diabetes mellitus).  Exercising has many health benefits, such as increasing muscle strength and bone density and reducing body fat and stress.  Your health care provider or certified diabetes educator can help you make a plan for the type and frequency of exercise (activity plan) that works for you.  When you start a new exercise or activity, work with your health care provider to make sure the activity is safe for you, and to adjust your insulin, medicines, or food intake as needed. This information is not intended to replace advice given to you by your health care provider. Make sure you discuss any questions you have with your health care provider. Document Released: 08/20/2003 Document Revised: 12/22/2016 Document Reviewed: 11/09/2015 Elsevier Patient Education  2020 [employer] Inc.  

## 2019-04-28 MED ORDER — ALBUTEROL SULFATE HFA 108 (90 BASE) MCG/ACT IN AERS: 2.0000 | INHALATION_SPRAY | Freq: Four times a day (QID) | RESPIRATORY_TRACT | 1 refills | Status: DC | PRN

## 2019-04-28 NOTE — Progress Notes (Signed)
Established Patient Office Visit  Subjective:  Patient ID: [name], adult    DOB: 06-19-1970  Age: 48 y.o. MRN: [id number]  CC:  Chief Complaint  Patient presents with  . Diabetes  . Insomnia    HPI [name] presents for management of diabetes, insomnia and has a compliant of scrotum pain. Last PCP encounter July since that time she has had spinal fusion-transforaminal lumbar interbody fusion with screw fixation and posterior spinal fusion by Dr. Diamantina Providence  and stop refill on medications until seen. She present with using a cane for stability.  Past Medical History:  Diagnosis Date  . Asthma   . Depression   . Diabetes mellitus   . GERD (gastroesophageal reflux disease)   . HIV disease ([place])   . Hypertension   . 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 Perirectal abscess 03/30/2017  . Pneumonia   . Polyuria 04/27/2015  . Poorly controlled diabetes mellitus ([place]) 04/27/2015  . Testicular mass 06/20/2016  . Transgender 04/27/2015    Past Surgical History:  Procedure Laterality Date  . APPENDECTOMY    . TRANSFORAMINAL LUMBAR INTERBODY FUSION (TLIF) WITH PEDICLE SCREW FIXATION 3 LEVEL N/A 01/09/2019   Procedure: TRANSFORAMINAL LUMBAR INTERBODY FUSION (TLIF) WITH PEDICLE SCREW FIXATION 3 LEVEL, POSTERIOR SPINAL FUSION;  Surgeon: [name], MD;  Location: [facility] ORS;  Service: Neurosurgery;  Laterality: N/A;    Family History  Problem Relation Age of Onset  . Heart failure Father   . Diabetes Father     Social History   Socioeconomic History  . Marital status: Single    Spouse name: Not on file  . Number of children: Not on file  . Years of education: Not on file  . Highest education level: Not on file  Occupational History  . Not on file  Social Needs  . Financial resource strain: Not on file  . Food insecurity    Worry: Not on file    Inability: Not on file  . Transportation needs    Medical: Not on file    Non-medical: Not on file  Tobacco Use  .  Smoking status: Former Smoker    Packs/day: 0.10    Years: 27.00    Pack years: 2.70    Types: Cigarettes    Quit date: 07/19/2018    Years since quitting: 0.7  . Smokeless tobacco: Never Used  . Tobacco comment: using chantix, down to 3/day  Substance and Sexual Activity  . Alcohol use: No    Alcohol/week: 0.0 standard drinks  . Drug use: Yes    Frequency: 7.0 times per week    Types: Marijuana  . Sexual activity: Yes    Partners: Male    Comment: pt. given condoms  Lifestyle  . Physical activity    Days per week: Not on file    Minutes per session: Not on file  . Stress: Not on file  Relationships  . Social Herbalist on phone: Not on file    Gets together: Not on file    Attends religious service: Not on file    Active member of club or organization: Not on file    Attends meetings of clubs or organizations: Not on file    Relationship status: Not on file  . Intimate partner violence    Fear of current or ex partner: Not on file    Emotionally abused: Not on file    Physically abused: Not on file  Forced sexual activity: Not on file  Other Topics Concern  . Not on file  Social History Narrative  . Not on file    Outpatient Medications Prior to Visit  Medication Sig Dispense Refill  . ACCU-CHEK SOFTCLIX LANCETS lancets 1 each by Other route 3 (three) times daily. 100 each 5  . albuterol (VENTOLIN HFA) 108 (90 Base) MCG/ACT inhaler Inhale 2 puffs into the lungs every 6 (six) hours as needed for wheezing or shortness of breath. 6.7 g 1  . BIKTARVY 50-200-25 MG TABS tablet TAKE 1 TABLET BY MOUTH DAILY. 30 tablet 5  . blood glucose meter kit and supplies Dispense based on patient and insurance preference. Use up to four times daily as directed. (FOR ICD-10 E10.9, E11.9). 1 each 0  . celecoxib (CELEBREX) 200 MG capsule Take 1 capsule (200 mg total) by mouth 2 (two) times daily. 60 capsule 0  . gabapentin (NEURONTIN) 300 MG capsule Take by mouth.    .  methocarbamol (ROBAXIN) 500 MG tablet Take 1-2 tablets (500-1,000 mg total) by mouth 4 (four) times daily. 90 tablet 0  . spironolactone (ALDACTONE) 50 MG tablet Take 1 tablet (50 mg total) by mouth daily. 30 tablet 5  . escitalopram (LEXAPRO) 20 MG tablet TAKE 1 TABLET BY MOUTH AT BEDTIME 30 tablet 5  . glimepiride (AMARYL) 4 MG tablet Take 1 tablet (4 mg total) by mouth daily before breakfast. 90 tablet 0  . Insulin Pen Needle (PEN NEEDLES) 31G X 8 MM MISC Inject 1 each into the skin daily. 100 each 1  . Melatonin 5 MG CAPS Take 1 capsule (5 mg total) by mouth at bedtime as needed. 30 capsule 3  . metFORMIN (GLUCOPHAGE) 1000 MG tablet Take 1 tablet (1,000 mg total) by mouth 2 (two) times daily with a meal. 60 tablet 0  . Insulin Glargine (LANTUS SOLOSTAR) 100 UNIT/ML Solostar Pen Inject 40 Units into the skin at bedtime. 15 mL 3  . varenicline (CHANTIX CONTINUING MONTH PAK) 1 MG tablet Take 1 tablet (1 mg total) by mouth 2 (two) times daily. 56 tablet 0   No facility-administered medications prior to visit.     Allergies  Allergen Reactions  . Propoxyphene N-Acetaminophen Other (See Comments)    Stomach cramps    ROS Review of Systems  Musculoskeletal: Positive for back pain.  Psychiatric/Behavioral: Positive for sleep disturbance.  All other systems reviewed and are negative.     Objective:    Physical Exam  Constitutional: She is oriented to person, place, and time. She appears well-developed and well-nourished.  HENT:  Head: Normocephalic.  Eyes: Pupils are equal, round, and reactive to light. EOM are normal.  Neck: Neck supple.  Cardiovascular: Normal rate and regular rhythm.  Pulmonary/Chest: Effort normal. She has wheezes.  Abdominal: Soft. Bowel sounds are normal.  Musculoskeletal: Normal range of motion.  Neurological: She is oriented to person, place, and time.  Psychiatric: She has a normal mood and affect. Her behavior is normal. Thought content normal.  Nursing  note and vitals reviewed.   BP 127/87 (BP Location: Left Arm, Patient Position: Sitting, Cuff Size: Normal)   Pulse (!) 105   Temp (!) 96.6 F (35.9 C) (Temporal)   Ht '5\' 11"'  (1.803 m)   Wt 243 lb 6.4 oz (110.4 kg)   SpO2 95%   BMI 33.95 kg/m  Wt Readings from Last 3 Encounters:  04/26/19 243 lb 6.4 oz (110.4 kg)  01/09/19 230 lb (104.3 kg)  09/12/18 251 lb (  113.9 kg)     Health Maintenance Due  Topic Date Due  . MAMMOGRAM  06/10/1988  . PAP SMEAR-Modifier  06/11/1991    There are no preventive care reminders to display for this patient.  Lab Results  Component Value Date   TSH 1.090 12/06/2017   Lab Results  Component Value Date   WBC 15.3 (H) 11/23/2018   HGB 16.6 11/23/2018   HCT 46.6 11/23/2018   MCV 93.4 11/23/2018   PLT 343 11/23/2018   Lab Results  Component Value Date   NA 138 11/23/2018   K 4.2 11/23/2018   CO2 23 11/23/2018   GLUCOSE 297 (H) 11/23/2018   BUN 20 11/23/2018   CREATININE 0.73 11/23/2018   BILITOT 0.4 09/12/2018   ALKPHOS 97 05/23/2018   AST 15 09/12/2018   ALT 26 09/12/2018   PROT 6.9 09/12/2018   ALBUMIN 4.0 05/23/2018   CALCIUM 9.1 11/23/2018   ANIONGAP 10 11/23/2018   Lab Results  Component Value Date   CHOL 144 02/22/2018   Lab Results  Component Value Date   HDL 30 (L) 02/22/2018   Lab Results  Component Value Date   LDLCALC 87 02/22/2018   Lab Results  Component Value Date   TRIG 160 (H) 02/22/2018   Lab Results  Component Value Date   CHOLHDL 4.8 02/22/2018   Lab Results  Component Value Date   HGBA1C 7.5 (A) 04/26/2019      Assessment & Plan:   Problem List Items Addressed This Visit    None    Visit Diagnoses    Type 2 diabetes mellitus without complication, with long-term current use of insulin (HCC)    -  Primary   Relevant Medications   metFORMIN (GLUCOPHAGE) 1000 MG tablet   Insulin Pen Needle (PEN NEEDLES) 31G X 8 MM MISC   Insulin Glargine (LANTUS SOLOSTAR) 100 UNIT/ML Solostar Pen    glimepiride (AMARYL) 4 MG tablet   Other Relevant Orders   HgB A1c (Completed)   Glucose (CBG) (Completed)   Ambulatory referral to Ophthalmology   Pre-operative clearance       Relevant Medications   Insulin Pen Needle (PEN NEEDLES) 31G X 8 MM MISC   Insulin Glargine (LANTUS SOLOSTAR) 100 UNIT/ML Solostar Pen   Moderate single current episode of major depressive disorder (HCC)       Relevant Medications   escitalopram (LEXAPRO) 20 MG tablet   Scrotum pain       Relevant Orders   Ambulatory referral to Urology   Need for immunization against influenza       Relevant Orders   Flu Vaccine QUAD 36+ mos IM (Completed)      Meds ordered this encounter  Medications  . metFORMIN (GLUCOPHAGE) 1000 MG tablet    Sig: Take 1 tablet (1,000 mg total) by mouth 2 (two) times daily with a meal.    Dispense:  60 tablet    Refill:  3  . Melatonin 5 MG CAPS    Sig: Take 1 capsule (5 mg total) by mouth at bedtime as needed.    Dispense:  30 capsule    Refill:  3  . Insulin Pen Needle (PEN NEEDLES) 31G X 8 MM MISC    Sig: Inject 1 each into the skin daily.    Dispense:  100 each    Refill:  1  . Insulin Glargine (LANTUS SOLOSTAR) 100 UNIT/ML Solostar Pen    Sig: Inject 40 Units into the skin at bedtime.  Dispense:  15 mL    Refill:  3  . glimepiride (AMARYL) 4 MG tablet    Sig: Take 1 tablet (4 mg total) by mouth daily before breakfast.    Dispense:  90 tablet    Refill:  0  . escitalopram (LEXAPRO) 20 MG tablet    Sig: Take 1 tablet (20 mg total) by mouth at bedtime.    Dispense:  30 tablet    Refill:  3    Follow-up: Return in about 3 months (around 07/27/2019) for DM.    [name], NP

## 2019-04-29 MED FILL — ALBUTEROL SULFATE HFA 108 (: 108 (90 BAS | 25 days supply | Qty: 18 | Fill #0

## 2019-04-29 MED FILL — BIKTARVY 50-200-25 MG TABS: 50-200-25 | 30 days supply | Qty: 30 | Fill #2

## 2019-04-29 MED FILL — METHOCARBAMOL 500 MG TABS: 500 | 22 days supply | Qty: 180 | Fill #0

## 2019-04-29 MED FILL — SPIRONOLACTONE 50 MG TABS: 50 | 30 days supply | Qty: 30 | Fill #4

## 2019-05-08 ENCOUNTER — Other Ambulatory Visit: Payer: Self-pay

## 2019-05-08 ENCOUNTER — Encounter: Payer: Self-pay | Admitting: Physical Therapy

## 2019-05-08 ENCOUNTER — Ambulatory Visit: Payer: Medicaid Other | Attending: Neurosurgery | Admitting: Physical Therapy

## 2019-05-08 DIAGNOSIS — M5441 Lumbago with sciatica, right side: Secondary | ICD-10-CM | POA: Insufficient documentation

## 2019-05-08 DIAGNOSIS — M6281 Muscle weakness (generalized): Secondary | ICD-10-CM | POA: Insufficient documentation

## 2019-05-08 DIAGNOSIS — R262 Difficulty in walking, not elsewhere classified: Secondary | ICD-10-CM | POA: Insufficient documentation

## 2019-05-08 NOTE — Therapy (Signed)
[facility] PHYSICAL AND SPORTS MEDICINE [address], [place], [address] Phone: [phone]   Fax:  [phone]  Physical Therapy Evaluation  Patient Details  Name: [name] MRN: [id number] Date of Birth: August 21, 1970 Referring Provider (PT): Venetia Night, MD   Encounter Date: 05/08/2019  PT End of Session - 05/08/19 1859    Visit Number  1    Number of Visits  16    Date for PT Re-Evaluation  07/17/19    Authorization Type  Medicaid reporting from 05/08/2019    Authorization - Visit Number  1    Authorization - Number of Visits  1    PT Start Time  0925    PT Stop Time  1030    PT Time Calculation (min)  65 min    Activity Tolerance  Patient tolerated treatment well    Behavior During Therapy  [facility] for tasks assessed/performed       Past Medical History:  Diagnosis Date  . Asthma   . Depression   . Diabetes mellitus   . GERD (gastroesophageal reflux disease)   . HIV disease (HCC)   . Hypertension   . 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 Perirectal abscess 03/30/2017  . Pneumonia   . Polyuria 04/27/2015  . Poorly controlled diabetes mellitus (HCC) 04/27/2015  . Testicular mass 06/20/2016  . Transgender 04/27/2015    Past Surgical History:  Procedure Laterality Date  . APPENDECTOMY    . TRANSFORAMINAL LUMBAR INTERBODY FUSION (TLIF) WITH PEDICLE SCREW FIXATION 3 LEVEL N/A 01/09/2019   Procedure: TRANSFORAMINAL LUMBAR INTERBODY FUSION (TLIF) WITH PEDICLE SCREW FIXATION 3 LEVEL, POSTERIOR SPINAL FUSION;  Surgeon: Venetia Night, MD;  Location: [facility] ORS;  Service: Neurosurgery;  Laterality: N/A;    There were no vitals filed for this visit.   Subjective Assessment - 05/08/19 0939    Subjective  Patient reports she came to physical therapy because she has muscle loss in her R leg and she has fallen a couple of times since her surgery. She thinks that subconsciously she is not putting the pressure on her R leg like she  should and she is having trouble going up and down stairs. She had chronic back pain and numbness down the R leg that lead to her fusion surgery. The numbness is now gone, but she has leg pain and weakness. Previously she did not have weakness. She has no identifiable injury to her back that she can recall. Her nerves were compromised so long that the nerves are healing and that is why there is so much pain now. She has not noticed any changes getting better or worse since the surgery. Radiographs look good multiple times since surgery. No MRI/CT scan since surgery. She no longer has back pain. Pain is now in lateral R thigh and medial R lower leg. Pain is present constantly but can get a bit lower when laying in the bed. Gets worse the moment she gets up and stands.    Pertinent History  Patient is a 48 y.o. transgender male who presents to outpatient physical therapy with a referral for medical diagnosis s/p lumbar fusion with "pt reports muscle loss right leg, s/p lumbar fusion (L4-5 and L5-s1 TLIF on 01/09/2019). This patient's chief complaints consist of R LE pain, weakness, knee buckling with onset following lumbar flusion performed 01/09/2019 leading to the following functional deficits: makes ADLs, IADLs more difficult, compromises QOL, getting in and out of bathtub, unable to  horse back ride, difficulty with standing, walking, ascending and descending stairs.  Relevant past medical history and comorbidities include hypertension, DMII well controlled (A1C 7.5 three weeks ago), HIV (reports getting appropriate treatment), depression/anxiety (reports is getting satisfactory care), former smoker, asthma (has inhaler). DENIES: heart disease, stroke (despite being in medical record - one doctor said yes, one said no and said migraine visual changes - pt states her blood glucose was causing it because it improved once she got A1C under control).    Limitations  Sitting;House hold  activities;Lifting;Standing;Walking;Other (comment)   makes ADLs, IADLs more difficult, compromises QOL, getting in and out of bathtub, unable to horse back ride, standing, walking, ascending and descending stairs.   How long can you sit comfortably?  unlimited    How long can you stand comfortably?  5 minutes    How long can you walk comfortably?  depends on what pain she is in beforehand (10-15 minutes)    Diagnostic tests  radiographs at post op follow ups WNL    Patient Stated Goals  "to build up the muscle mass in the R leg where I can go up and down stairs freely and walk freely"    Currently in Pain?  Yes    Pain Score  3    worst: 7-8/10; best 0/10   Pain Location  Leg    Pain Orientation  Right   lateral thigh, medial distal lower leg   Pain Descriptors / Indicators  Burning    Pain Type  Chronic pain;Neuropathic pain    Pain Radiating Towards  R lateral thigh, and medial distal lower leg    Pain Onset  More than a month ago    Pain Frequency  Constant    Aggravating Factors   standing, walking, getting in and out of bath tub, sometimes just laying in the bed (sleeps laying on R, also lays on back or sides),    Pain Relieving Factors  laying down (on stomach with leg extended out), hot water in the tub, sometimes medications (gabapentin and methocarbinal), changing positions    Effect of Pain on Daily Activities  Functional limitations: makes ADLs, IADLs more difficult, compromises QOL, getting in and out of bathtub, unable to horse back ride, standing, walking, ascending and descending stairs, prolonged sitting.          [facility] PT Assessment - 05/08/19 0001      Assessment   Medical Diagnosis  s/p lumbar fusion    Referring Provider (PT)  [name], MD    Onset Date/Surgical Date  01/09/19    Hand Dominance  Left    Next MD Visit  January 2021    Prior Therapy  other than this episode of care, she had PT a long time ago for her back, did not help at the time.        Precautions   Precautions  None      Restrictions   Weight Bearing Restrictions  No      Balance Screen   Has the patient fallen in the past 6 months  Yes    How many times?  4    Has the patient had a decrease in activity level because of a fear of falling?   Yes    Is the patient reluctant to leave their home because of a fear of falling?   Yes      [place]  Other relatives   sister   Type of Home  House    Home Access  Stairs to enter    Entrance Stairs-Number of Steps  15   driveway to yard: 15; yard to porch: 3 both have Radio broadcast assistantrail   Entrance Stairs-Rails  Can reach both;Right;Left    Home Layout  One level    Product/process development scientistHome Equipment  Walker - 2 wheels;Cane - single point   no longer uses     Prior Function   Level of Independence  Independent    Vocation  On disability    Vocation Requirements  prior seamstress    Leisure  love to horse back ride (last time was more than 15 years ago), sewing, creating/designing, fashion and Scientific laboratory techniciancostume designer       Cognition   Overall Cognitive Status  Within Functional Limits for tasks assessed      Observation/Other Assessments   Observations  see note from 05/08/2019 for latest objective data    Oswestry Disability Index   OSWESTRY = 56% (05/08/2019);         OBJECTIVE  OBSERVATION/INSPECTION . Posture: mildly  forward head, rounded shoulders, slumped in sitting. No lateral shift in standing.  . Tremor: none . Muscle bulk: decreased in R calf and thigh compared to left.  o Calf Circumference: R = 43 cm; L = 42 cm . Skin: WFL . Gait: grossly WFL for household and short community ambulation. More detailed gait analysis deferred to later date as needed.   NEUROLOGICAL Upper Motor Neuron Screen Hoffman's, and Clonus (ankle) negative bilaterally  Dermatomes . L2-L4 and S1-S2 appears equal and intact to light touch. L5 diminished to light touch on R.    Myotomes . Diminished at L3, L4, L5, S1 on R compared to left.   Deep Tendon Reflexes R/L  . 0/2+ Quadriceps reflex (L4) . 0/0 Achilles reflex (S1)  Neurodynamic Tests: R SLR positive for   SPINE MOTION Lumbar AROM *Indicates pain - Flexion: = fingers 2 inches below patella, apprehensive about R knee buckle  - Extension: = WFL  - Rotation: B = WFL - Side Flexion: B = WFL (almost fell)  PERIPHERAL JOINT MOTION (in degrees) Comments: B hips and knees WNL PROM except some limitation in IR R >L. R IR overpressure mild pain at R back.   MUSCLE PERFORMANCE (MMT):  *Indicates pain 05/08/19 Date Date  Joint/Motion R/L R/L R/L  Hip     Flexion (knee flex) 4/4+ / /  Extension (knee ext) 4/4+ / /  Abduction 3/4 / /  Knee     Flexion 4+/5 / /  Extension 3*/5 / /  Ankle/Foot     Dorsiflexion (knee ext) / / /  Dorsiflexion (knee flex) 2/5 / /  Plantarflexion 4/5 / /  Everison 4/5 / /  Great toe extension 3+/5 / /  Comments: R knee extension neurological weakness (pain in medial knee), R sided weakness with neurological quality L3/4 through S1 Able to toe walk bilaterally with R heel slightly lower, only able to heel walk on L LE (BUE support).   REPEATED MOTIONS TESTING:  Motion/Technique sets x reps During After ROM Functional test** Stoplight Comments  Prone press up x10 No pain No change  No change Yellow   **Functional test: R knee extension and R dorsiflexion MMT Comments: Patient with no pain in prone lying or prone press ups. No change in R leg weakness following.   ACCESSORY MOTION:  - CPA to lumbar spine negative  for reproduction of leg symptoms.   PALPATION: - TTP at R medial joint line and surrounding patella.  - TTP R lateral thigh and distal medial lower leg.  - Not TTP at low back or bilateral glutes.   FUNCTIONAL MOBILITY: - Bed mobility: Able to complete supine <> prone <> sit I with mild pain.  - Transfers: able to complete sit <> stand I with  discomfort.  - Stairs: ascended step to gait pattern leading with L ascending and R descending, BUE support. Patient states to prevent buckling of R leg.   EDUCATION/COGNITION: Patient is alert and oriented X 4.   Objective measurements completed on examination: See above findings.     TREATMENT:   Therapeutic exercise: to centralize symptoms and improve ROM, strength, muscular endurance, and activity tolerance required for successful completion of functional activities.  - hooklying bridges x 10 - supine quad set x 10, 5 second hold, towel roll under knee.  - prone press up x 10 with lock and sag lat 4 reps.  Cuing for improved breathing, specific muscle contraction and relaxation.   - Education on diagnosis, prognosis, POC, anatomy and physiology of current condition.  - Education on HEP including handout   Exercise purpose/form. Self management techniques. Education on diagnosis, prognosis, POC, anatomy and physiology of current condition Education on HEP including handout   HOME EXERCISE PROGRAM Access Code: [email or web address]  URL: [email or web address]  Date: 05/08/2019  Prepared by: Norton Blizzard   Exercises Prone Press Up - 10-15 reps - 1 second hold - 4x daily Supine Quad Set - 20 reps - 5 seconds hold - 1x daily Bridge - 3 sets - 10 reps - 3 seconds hold - 1x daily     PT Education - 05/08/19 1859    Education Details  Exercise purpose/form. Self management techniques. Education on diagnosis, prognosis, POC, anatomy and physiology of current condition Education on HEP including handout    Person(s) Educated  Patient    Methods  Explanation;Demonstration;Tactile cues;Verbal cues;Handout    Comprehension  Verbalized understanding;Returned demonstration;Verbal cues required;Need further instruction;Tactile cues required       PT Short Term Goals - 05/08/19 1849      PT SHORT TERM GOAL #1   Title  Be independent with initial home exercise program for  self-management of symptoms.    Baseline  Initial HEP provided at IE (05/08/2019);    Time  2    Period  Weeks    Status  New    Target Date  05/22/19        PT Long Term Goals - 05/08/19 1850      PT LONG TERM GOAL #1   Title  Be independent with a long-term home exercise program for self-management of symptoms.    Baseline  Initial HEP provided at IE (05/08/2019):    Time  6    Period  Weeks    Status  New    Target Date  07/17/19      PT LONG TERM GOAL #2   Title  Improve Modified Oswestry Disability Index score to equal or less than 20% in order to demonstrate improved self-reported function.    Baseline  OSWESTRY = 56% (05/08/2019);    Time  6    Period  Weeks    Status  New    Target Date  07/17/19      PT LONG TERM GOAL #3   Title  Patient will demonstrate equal or greater than 4+/5 pain in  R LE to improve patient's ability to complete functional activities and reduce risk of falling.    Baseline  see objective data (05/08/2019);    Time  6    Period  Weeks    Status  New    Target Date  07/17/19      PT LONG TERM GOAL #4   Title  Patient will score equal or greater than 25/30 on Functional Gait Assessment to demonstrate low fall risk.    Baseline  testing deferred to next session due to time constraints (05/08/2019);    Time  6    Period  Weeks    Status  New    Target Date  07/17/19      PT LONG TERM GOAL #5   Title  Complete community, work and/or recreational activities without limitation due to current condition.    Baseline  Functional limitations: makes ADLs, IADLs more difficult, compromises QOL, getting in and out of bathtub, unable to horse back ride, standing, walking, ascending and descending stairs, prolonged sitting, increased fall risk (05/08/2019);    Time  6    Period  Weeks    Status  New    Target Date  07/17/19      Additional Long Term Goals   Additional Long Term Goals  Yes      PT LONG TERM GOAL #6   Title  Be able to demonstrate  floor to waist lift with proper form without limitation due to current condition in order to improve ability to lift during usual activities.    Baseline  fear of R knee buckling (05/08/2019);    Time  6    Period  Weeks    Status  New    Target Date  07/17/19        Plan - 05/08/19 1845    Clinical Impression Statement  Patient is a 48 y.o. transgender male referred to outpatient physical therapy with a medical diagnosis of s/p lumbar fusion reporting muscle loss right leg, s/p lumbar fusion (L4-5 and L5-s1 TLIF on 01/09/2019) who presents with signs and symptoms consistent with R LE neurologic deficit including pain, weakness, decreased stability, increased fall risk that started following lumbar fusion performed 01/09/2019. Patient presents with significant pain, muscle performance (strength/power/endurance), balance, sensation, myotome weakness  impairments that are limiting ability to complete ADLs, IADLs more difficult, compromises QOL, getting in and out of bathtub, unable to horse back ride, bend, lift, twist, carry, standing, walking, ascending and descending stairs, prolonged sitting, increased fall risk without difficulty. Patient appears to have worsening neurologic function in peripheral nerves of R LE following lumbar fusion, although surgery resolved lumbar pain and leg numbness that was present prior to surgery. Patient will benefit from skilled physical therapy intervention to address current body structure impairments and activity limitations to improve function and work towards goals set in current POC in order to return to prior level of function or maximal functional improvement.    Personal Factors and Comorbidities  Comorbidity 3+;Fitness;Past/Current Experience;Time since onset of injury/illness/exacerbation;Age    Comorbidities  Relevant past medical history and comorbidities include hypertension, DMII well controlled (A1C 7.5 three weeks ago), HIV (reports getting appropriate  treatment), depression/anxiety (reports is getting satisfactory care), former smoker, asthma (has inhaler). DENIES: heart disease, stroke (despite being in medical record - one doctor said yes, one said no and said migraine visual changes - pt states her blood glucose was causing it because it improved once she got A1C under control).  Examination-Activity Limitations  Bathing;Bend;Lift;Squat;Stairs;Stand;Carry    Examination-Participation Restrictions  Community Activity;Meal Prep;Interpersonal Relationship;Cleaning;Other   activities that require standing, walking, stairs, lifting, bending, twisting   Stability/Clinical Decision Making  Evolving/Moderate complexity    Clinical Decision Making  Moderate    Rehab Potential  Good    PT Frequency  2x / week    PT Duration  Other (comment)   10 weeks   PT Treatment/Interventions  Neuromuscular re-education;ADLs/Self Care Home Management;Aquatic Therapy;Moist Heat;Cryotherapy;Functional mobility training;Therapeutic activities;Therapeutic exercise;Balance training;Patient/family education;Manual techniques;Passive range of motion;Dry needling;Visual/perceptual remediation/compensation;Spinal Manipulations;Joint Manipulations;Stair training;Gait training    PT Next Visit Plan  LE strengthening    PT Home Exercise Plan  Medbridge Access Code: [email or web address]    Consulted and Agree with Plan of Care  Patient       Patient will benefit from skilled therapeutic intervention in order to improve the following deficits and impairments:  Decreased balance, Decreased endurance, Decreased mobility, Difficulty walking, Impaired perceived functional ability, Improper body mechanics, Impaired tone, Pain, Decreased strength, Decreased activity tolerance, Decreased coordination  Visit Diagnosis: No diagnosis found.     Problem List Patient Active Problem List   Diagnosis Date Noted  . S/P lumbar fusion 01/09/2019  . Medication monitoring encounter 09/12/2018  .  Ischemic stroke (HCC) 10/31/2017  . Visual field defect   . Complicated migraine   . Migraine with visual aura   . Essential hypertension 06/01/2016  . Diabetes mellitus type 2 in obese (HCC) 06/01/2016  . Poorly controlled diabetes mellitus (HCC) 04/27/2015  . Hearing loss secondary to cerumen impaction 02/07/2013  . Smoker 03/07/2011  . Asthma 02/07/2008  . Male-to-male transgender person 10/31/2007  . Depression with anxiety 10/17/2007  . Human immunodeficiency virus (HIV) disease (HCC) 04/14/2006  . HIDRADENITIS SUPPURATIVA 04/14/2006    Luretha Murphy. Ilsa Iha, PT, DPT 05/08/19, 7:02 PM  [facility] PHYSICAL AND SPORTS MEDICINE [address], [place], [address] Phone: [phone]   Fax:  [phone]  Name: [name] MRN: [id number] Date of Birth: 03/13/1971

## 2019-05-08 NOTE — Addendum Note (Signed)
Addended by: Rosita Kea R on: 05/08/2019 07:05 PM   Modules accepted: Orders

## 2019-05-15 ENCOUNTER — Ambulatory Visit: Payer: Medicaid Other | Admitting: Physical Therapy

## 2019-05-17 MED FILL — metFORMIN HCL 1000 MG TABS: 1000 | 30 days supply | Qty: 60 | Fill #0

## 2019-05-17 MED FILL — GABAPENTIN 300 MG CAPSULE: 300 | 30 days supply | Qty: 150 | Fill #1

## 2019-05-20 ENCOUNTER — Ambulatory Visit: Payer: Medicaid Other | Attending: Neurosurgery | Admitting: Physical Therapy

## 2019-05-20 ENCOUNTER — Encounter: Payer: Self-pay | Admitting: Physical Therapy

## 2019-05-20 ENCOUNTER — Other Ambulatory Visit: Payer: Self-pay

## 2019-05-20 DIAGNOSIS — R262 Difficulty in walking, not elsewhere classified: Secondary | ICD-10-CM | POA: Diagnosis not present

## 2019-05-20 DIAGNOSIS — M5441 Lumbago with sciatica, right side: Secondary | ICD-10-CM | POA: Insufficient documentation

## 2019-05-20 DIAGNOSIS — M6281 Muscle weakness (generalized): Secondary | ICD-10-CM | POA: Insufficient documentation

## 2019-05-20 NOTE — Therapy (Signed)
[facility] PHYSICAL AND SPORTS MEDICINE [address] [place], [place], [address] Phone: [phone]   Fax:  [phone]  Physical Therapy Treatment  Patient Details  Name: [name] MRN: [id number] Date of Birth: 07/24/1970 Referring Provider (PT): Venetia Nighthester Yarbrough, MD   Encounter Date: 05/20/2019  PT End of Session - 05/20/19 1306    Visit Number  2    Number of Visits  16    Date for PT Re-Evaluation  07/17/19    Authorization Type  Medicaid reporting from 05/08/2019    Authorization - Visit Number  1    Authorization - Number of Visits  3    PT Start Time  1115    PT Stop Time  1200    PT Time Calculation (min)  45 min    Activity Tolerance  Patient tolerated treatment well    Behavior During Therapy  [facility] for tasks assessed/performed       Past Medical History:  Diagnosis Date  . Asthma   . Depression   . Diabetes mellitus   . GERD (gastroesophageal reflux disease)   . HIV disease (HCC)   . Hypertension   . 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 Perirectal abscess 03/30/2017  . Pneumonia   . Polyuria 04/27/2015  . Poorly controlled diabetes mellitus (HCC) 04/27/2015  . Testicular mass 06/20/2016  . Transgender 04/27/2015    Past Surgical History:  Procedure Laterality Date  . APPENDECTOMY    . TRANSFORAMINAL LUMBAR INTERBODY FUSION (TLIF) WITH PEDICLE SCREW FIXATION 3 LEVEL N/A 01/09/2019   Procedure: TRANSFORAMINAL LUMBAR INTERBODY FUSION (TLIF) WITH PEDICLE SCREW FIXATION 3 LEVEL, POSTERIOR SPINAL FUSION;  Surgeon: Venetia NightYarbrough, [place], MD;  Location: [facility] ORS;  Service: Neurosurgery;  Laterality: N/A;    There were no vitals filed for this visit.  Subjective Assessment - 05/20/19 1118    Subjective  Patient reports she has not seen much change overal in her pain or weakness. Reports 3/10 pain in the R lateral thigh upon arrival today. States the last time she felt pain in the lower leg was on Saturday. Reports HEP is going well.  Denies paresthesia at the moment. No falls since last session.    Pertinent History  Patient is a 48 y.o. transgender male who presents to outpatient physical therapy with a referral for medical diagnosis s/p lumbar fusion with "pt reports muscle loss right leg, s/p lumbar fusion (L4-5 and L5-s1 TLIF on 01/09/2019). This patient's chief complaints consist of R LE pain, weakness, knee buckling with onset following lumbar flusion performed 01/09/2019 leading to the following functional deficits: makes ADLs, IADLs more difficult, compromises QOL, getting in and out of bathtub, unable to horse back ride, difficulty with standing, walking, ascending and descending stairs.  Relevant past medical history and comorbidities include hypertension, DMII well controlled (A1C 7.5 three weeks ago), HIV (reports getting appropriate treatment), depression/anxiety (reports is getting satisfactory care), former smoker, asthma (has inhaler). DENIES: heart disease, stroke (despite being in medical record - one doctor said yes, one said no and said migraine visual changes - pt states her blood glucose was causing it because it improved once she got A1C under control).    Limitations  Sitting;House hold activities;Lifting;Standing;Walking;Other (comment)   makes ADLs, IADLs more difficult, compromises QOL, getting in and out of bathtub, unable to horse back ride, standing, walking, ascending and descending stairs.   How long can you sit comfortably?  unlimited    How long can you  stand comfortably?  5 minutes    How long can you walk comfortably?  depends on what pain she is in beforehand (10-15 minutes)    Diagnostic tests  radiographs at post op follow ups WNL    Patient Stated Goals  "to build up the muscle mass in the R leg where I can go up and down stairs freely and walk freely"    Currently in Pain?  Yes    Pain Score  3     Pain Location  Leg    Pain Orientation  Proximal;Upper;Lateral;Right    Pain Onset  More than a  month ago      TREATMENT:   Therapeutic exercise:to centralize symptoms and improve ROM, strength, muscular endurance, and activity tolerance required for successful completion of functional activities.   -Treadmill at 1.84mph at 0% grade. For improved lower extremity mphmobility, muscular endurance, and weightbearing activity tolerance; and to induce the analgesic effect of aerobic exercise, stimulate improved joint nutrition, and prepare body structures and systems for following interventions. x 5 min minutes.   - hooklying bridges x 30, cuing for improved glute contraction.  - hooklying TA contraction with shoulders flexed to 90 degrees and alternating LE extension.  Cuing for abdominal contraction. Reports proximal R thigh pain at rep 6. X6, x12, x10, x2 (each side),  - sit < > stand from 22 inch surface. Mild R knee pain that worsened with reps but improved with rest. Knee position good.  - heel raises with BUE support x 20.  - step up with R leg to 4 inch step x 15. R UE support. Cuing to keep knee from locking.  Attempted with 6 inch step, but took painful and uncontrolled.   - seated LAQ, x3 at 10# R side .Too painful, reduced to 7.5# for remaining reps. 2x10 each side at 7.5# except first 3 on R.   - seated hamstring curl with green band looped behind ankle and held by clinician. 2x10 each side.   - Education on HEP including handout    Cuing for improved breathing, specific muscle contraction and relaxation, appropriate response to discomfort/pain. Guidance on stop/go decisions based on response.   HOME EXERCISE PROGRAM Access Code: [email or web address]  URL: [email or web address]  Date: 05/20/2019  Prepared by: Rosita Kea   Exercises Bridge - 1 sets - 30 reps - 3 seconds hold - 1x daily Supine Dead Bug with Leg Extension - 3 sets - 10 reps - 1x daily Sit to Stand - 10 reps - 3x daily    PT Education - 05/20/19 1306    Education Details  Exercise purpose/form.  Self management techniques. Updated HEP    Person(s) Educated  Patient    Methods  Explanation;Demonstration;Tactile cues;Verbal cues;Handout    Comprehension  Verbalized understanding;Returned demonstration;Verbal cues required;Tactile cues required;Need further instruction       PT Short Term Goals - 05/08/19 1849      PT SHORT TERM GOAL #1   Title  Be independent with initial home exercise program for self-management of symptoms.    Baseline  Initial HEP provided at IE (05/08/2019);    Time  2    Period  Weeks    Status  New    Target Date  05/22/19        PT Long Term Goals - 05/08/19 1850      PT LONG TERM GOAL #1   Title  Be independent with a long-term home exercise program for self-management of symptoms.  Baseline  Initial HEP provided at IE (05/08/2019):    Time  6    Period  Weeks    Status  New    Target Date  07/17/19      PT LONG TERM GOAL #2   Title  Improve Modified Oswestry Disability Index score to equal or less than 20% in order to demonstrate improved self-reported function.    Baseline  OSWESTRY = 56% (05/08/2019);    Time  6    Period  Weeks    Status  New    Target Date  07/17/19      PT LONG TERM GOAL #3   Title  Patient will demonstrate equal or greater than 4+/5 pain in R LE to improve patient's ability to complete functional activities and reduce risk of falling.    Baseline  see objective data (05/08/2019);    Time  6    Period  Weeks    Status  New    Target Date  07/17/19      PT LONG TERM GOAL #4   Title  Patient will score equal or greater than 25/30 on Functional Gait Assessment to demonstrate low fall risk.    Baseline  testing deferred to next session due to time constraints (05/08/2019);    Time  6    Period  Weeks    Status  New    Target Date  07/17/19      PT LONG TERM GOAL #5   Title  Complete community, work and/or recreational activities without limitation due to current condition.    Baseline  Functional limitations:  makes ADLs, IADLs more difficult, compromises QOL, getting in and out of bathtub, unable to horse back ride, standing, walking, ascending and descending stairs, prolonged sitting, increased fall risk (05/08/2019);    Time  6    Period  Weeks    Status  New    Target Date  07/17/19      Additional Long Term Goals   Additional Long Term Goals  Yes      PT LONG TERM GOAL #6   Title  Be able to demonstrate floor to waist lift with proper form without limitation due to current condition in order to improve ability to lift during usual activities.    Baseline  fear of R knee buckling (05/08/2019);    Time  6    Period  Weeks    Status  New    Target Date  07/17/19            Plan - 05/20/19 1312    Clinical Impression Statement  Pt tolerated treatment well overall with mild difficulty and limitations due to pain in R LE, particularly at the knee and lateral thigh. Pt required multimodal cuing for proper technique and to facilitate improved neuromuscular control, strength, range of motion, and functional ability resulting in improved performance and form.  Session focused on trunk, glute, quad, and ankle strengthening progressing to functional exercises as tolerated. Patient continues to demonstrate poor control of R knee due to weakness but able to tolerate more targeted strengthening. Updated HEP to reflect progress. Patient would benefit from continued physical therapy to address remaining impairments and functional limitations to work towards stated goals and return to PLOF or maximal functional independence.    Personal Factors and Comorbidities  Comorbidity 3+;Fitness;Past/Current Experience;Time since onset of injury/illness/exacerbation;Age    Comorbidities  Relevant past medical history and comorbidities include hypertension, DMII well controlled (A1C 7.5 three weeks  ago), HIV (reports getting appropriate treatment), depression/anxiety (reports is getting satisfactory care), former  smoker, asthma (has inhaler). DENIES: heart disease, stroke (despite being in medical record - one doctor said yes, one said no and said migraine visual changes - pt states her blood glucose was causing it because it improved once she got A1C under control).    Examination-Activity Limitations  Bathing;Bend;Lift;Squat;Stairs;Stand;Carry    Examination-Participation Restrictions  Community Activity;Meal Prep;Interpersonal Relationship;Cleaning;Other   activities that require standing, walking, stairs, lifting, bending, twisting   Stability/Clinical Decision Making  Evolving/Moderate complexity    Rehab Potential  Good    PT Frequency  2x / week    PT Duration  Other (comment)   10 weeks   PT Treatment/Interventions  Neuromuscular re-education;ADLs/Self Care Home Management;Aquatic Therapy;Moist Heat;Cryotherapy;Functional mobility training;Therapeutic activities;Therapeutic exercise;Balance training;Patient/family education;Manual techniques;Passive range of motion;Dry needling;Visual/perceptual remediation/compensation;Spinal Manipulations;Joint Manipulations;Stair training;Gait training    PT Next Visit Plan  LE strengthening    PT Home Exercise Plan  Medbridge Access Code: [email or web address]    Consulted and Agree with Plan of Care  Patient       Patient will benefit from skilled therapeutic intervention in order to improve the following deficits and impairments:  Decreased balance, Decreased endurance, Decreased mobility, Difficulty walking, Impaired perceived functional ability, Improper body mechanics, Impaired tone, Pain, Decreased strength, Decreased activity tolerance, Decreased coordination  Visit Diagnosis: Muscle weakness (generalized)  Right-sided low back pain with right-sided sciatica, unspecified chronicity  Difficulty in walking, not elsewhere classified     Problem List Patient Active Problem List   Diagnosis Date Noted  . S/P lumbar fusion 01/09/2019  . Medication monitoring  encounter 09/12/2018  . Ischemic stroke (HCC) 10/31/2017  . Visual field defect   . Complicated migraine   . Migraine with visual aura   . Essential hypertension 06/01/2016  . Diabetes mellitus type 2 in obese (HCC) 06/01/2016  . Poorly controlled diabetes mellitus (HCC) 04/27/2015  . Hearing loss secondary to cerumen impaction 02/07/2013  . Smoker 03/07/2011  . Asthma 02/07/2008  . Male-to-male transgender person 10/31/2007  . Depression with anxiety 10/17/2007  . Human immunodeficiency virus (HIV) disease (HCC) 04/14/2006  . HIDRADENITIS SUPPURATIVA 04/14/2006    Luretha Murphy. Ilsa Iha, PT, DPT 05/20/19, 1:12 PM  [facility] PHYSICAL AND SPORTS MEDICINE [address] [place], [address] Phone: [phone]   Fax:  [phone]  Name: [name] MRN: [id number] Date of Birth: 07-12-1970

## 2019-05-22 ENCOUNTER — Ambulatory Visit: Payer: Medicaid Other | Admitting: Physical Therapy

## 2019-05-27 ENCOUNTER — Ambulatory Visit: Payer: Medicaid Other | Admitting: Physical Therapy

## 2019-05-27 ENCOUNTER — Telehealth: Payer: Self-pay | Admitting: Physical Therapy

## 2019-05-27 MED FILL — ACCU-CHEK AVIVA PLUS STRP: 30 days supply | Qty: 100 | Fill #2

## 2019-05-27 MED FILL — LANTUS SOLOSTAR 100 UNITS/M: 100 | 37 days supply | Qty: 15 | Fill #3

## 2019-05-27 MED FILL — ALBUTEROL SULFATE HFA 108 (: 108 (90 BAS | 25 days supply | Qty: 18 | Fill #1

## 2019-05-27 MED FILL — SPIRONOLACTONE 50 MG TABS: 50 | 30 days supply | Qty: 30 | Fill #5

## 2019-05-27 MED FILL — ESCITALOPRAM 20 MG TABLET: 20 | 30 days supply | Qty: 30 | Fill #1

## 2019-05-27 MED FILL — METHOCARBAMOL 500 MG TABS: 500 | 22 days supply | Qty: 180 | Fill #1

## 2019-05-27 NOTE — Telephone Encounter (Signed)
Called patient when she did not show up for her 11:15 appointment today. No answer. Left voicemail informing patient of missed visit, offering appointment times at 5 and 5:45 this evening (with request to call back to secure one of those times if desired), and informed of next scheduled visit Mon 06/03/2019 at 5:30pm. Call back number: [place]. Graylon Good, PT, DPT 05/27/19, 11:51 AM

## 2019-05-29 ENCOUNTER — Encounter: Payer: Medicaid Other | Admitting: Physical Therapy

## 2019-06-03 ENCOUNTER — Ambulatory Visit: Payer: Medicaid Other | Admitting: Physical Therapy

## 2019-06-05 ENCOUNTER — Ambulatory Visit: Payer: Medicaid Other | Admitting: Physical Therapy

## 2019-06-10 ENCOUNTER — Ambulatory Visit: Payer: Medicaid Other | Admitting: Physical Therapy

## 2019-06-10 ENCOUNTER — Telehealth: Payer: Self-pay | Admitting: Physical Therapy

## 2019-06-10 MED FILL — metFORMIN HCL 1000 MG TABS: 1000 | 30 days supply | Qty: 60 | Fill #1

## 2019-06-10 MED FILL — GABAPENTIN 300 MG CAPSULE: 300 | 30 days supply | Qty: 150 | Fill #2

## 2019-06-10 MED FILL — BIKTARVY 50-200-25 MG TABS: 50-200-25 | 30 days supply | Qty: 30 | Fill #3

## 2019-06-10 NOTE — Telephone Encounter (Signed)
After discussing with office staff, called patient to let her know her insurance authorization has expired, so we are cancelling all future appointments until we hear from her and are able to request more visits. No answer, left message explaining above as well as that we will be able to request further authorization if patient wants to continue physical therapy. Requested patient call back and let us know if she wants to continue and explained that we will need to wait for auth to come back before we schedule any more appts so we know when they can start. Also wished pt happy birthday (tomorrow). Provided call back number: [phone] \  Everlean Alstrom. Graylon Good, PT, DPT 06/10/19, 10:52 AM

## 2019-06-12 ENCOUNTER — Encounter: Payer: Medicaid Other | Admitting: Physical Therapy

## 2019-06-28 ENCOUNTER — Encounter: Payer: Medicaid Other | Admitting: *Deleted

## 2019-07-01 ENCOUNTER — Encounter: Payer: Medicaid Other | Admitting: *Deleted

## 2019-07-12 ENCOUNTER — Encounter (INDEPENDENT_AMBULATORY_CARE_PROVIDER_SITE_OTHER): Payer: Self-pay | Admitting: *Deleted

## 2019-07-12 ENCOUNTER — Other Ambulatory Visit: Payer: Self-pay

## 2019-07-12 ENCOUNTER — Encounter: Payer: Self-pay | Admitting: Infectious Disease

## 2019-07-12 VITALS — BP 103/72 | HR 90 | Temp 97.7°F | Wt 242.1 lb

## 2019-07-12 DIAGNOSIS — Z006 Encounter for examination for normal comparison and control in clinical research program: Secondary | ICD-10-CM

## 2019-07-12 LAB — CD4/CD8 (T-HELPER/T-SUPPRESSOR CELL)
CD4 % Helper T Cell: 54.3
CD4 Count: 2552
CD8 % Suppressor T Cell: 25.2
CD8 T Cell Abs: 1184
HIV 1 RNA UltraQuant: <40

## 2019-07-12 MED FILL — BIKTARVY 50-200-25 MG TABS: 50-200-25 | 30 days supply | Qty: 30 | Fill #4

## 2019-07-12 MED FILL — metFORMIN HCL 1000 MG TABS: 1000 | 30 days supply | Qty: 60 | Fill #2

## 2019-07-12 MED FILL — ESCITALOPRAM 20 MG TABLET: 20 | 30 days supply | Qty: 30 | Fill #2

## 2019-07-12 NOTE — Research (Signed)
[name] is here for her week 336 [id number] visit. States that she had back surgery in July 2020. States that her back pain is "gone". Continues to have some burning sensation to (R) leg. Incision to her back is healed. States that she has also stopped smoking. Congratulated her on this huge accomplishment. States that she has not smoked since July. Has been adherent with her ARV. She will return in March to see Dr. [name]. Next study visit scheduled for 7/15.

## 2019-07-13 LAB — COMPREHENSIVE METABOLIC PANEL
AG Ratio: 1.4 (calc) (ref 1.0–2.5)
ALT: 17 U/L (ref 9–46)
AST: 14 U/L (ref 10–40)
Albumin: 4.3 g/dL (ref 3.6–5.1)
Alkaline phosphatase (APISO): 82 U/L (ref 36–130)
BUN: 16 mg/dL (ref 7–25)
CO2: 28 mmol/L (ref 20–32)
Calcium: 9.7 mg/dL (ref 8.6–10.3)
Chloride: 103 mmol/L (ref 98–110)
Creat: 0.74 mg/dL (ref 0.60–1.35)
Globulin: 3 g/dL (calc) (ref 1.9–3.7)
Glucose, Bld: 176 mg/dL — ABNORMAL HIGH (ref 65–99)
Potassium: 4.7 mmol/L (ref 3.5–5.3)
Sodium: 139 mmol/L (ref 135–146)
Total Bilirubin: 0.4 mg/dL (ref 0.2–1.2)
Total Protein: 7.3 g/dL (ref 6.1–8.1)

## 2019-07-13 LAB — LIPID PANEL
Cholesterol: 150 mg/dL (ref <200)
HDL: 37 mg/dL — ABNORMAL LOW (ref >=40)
LDL Cholesterol (Calc): 80 mg/dL (calc)
Non-HDL Cholesterol (Calc): 113 mg/dL (calc) (ref <130)
Total CHOL/HDL Ratio: 4.1 (calc) (ref <5.0)
Triglycerides: 241 mg/dL — ABNORMAL HIGH (ref <150)

## 2019-07-13 LAB — HEMOGLOBIN A1C
Hgb A1c MFr Bld: 6.9 % of total Hgb — ABNORMAL HIGH (ref <5.7)
Mean Plasma Glucose: 151 (calc)
eAG (mmol/L): 8.4 (calc)

## 2019-07-13 LAB — EXTRA URINE SPECIMEN

## 2019-07-13 LAB — PROTEIN, URINE, RANDOM: Total Protein, Urine: 18 mg/dL (ref 5–25)

## 2019-07-13 LAB — CREATININE, URINE, RANDOM: Creatinine, Urine: 114 mg/dL (ref 20–320)

## 2019-07-26 ENCOUNTER — Encounter: Payer: Self-pay | Admitting: Infectious Disease

## 2019-07-26 LAB — CD4/CD8 (T-HELPER/T-SUPPRESSOR CELL): HIV 1 RNA UltraQuant: <40

## 2019-08-08 MED FILL — BIKTARVY 50-200-25 MG TABS: 50-200-25 | 30 days supply | Qty: 30 | Fill #5

## 2019-08-08 MED FILL — metFORMIN HCL 1000 MG TABS: 1000 | 30 days supply | Qty: 60 | Fill #3

## 2019-08-08 MED FILL — ESCITALOPRAM 20 MG TABLET: 20 | 30 days supply | Qty: 30 | Fill #3

## 2019-08-15 ENCOUNTER — Ambulatory Visit (INDEPENDENT_AMBULATORY_CARE_PROVIDER_SITE_OTHER): Payer: Medicaid Other | Admitting: Infectious Disease

## 2019-08-15 ENCOUNTER — Encounter: Payer: Self-pay | Admitting: Infectious Disease

## 2019-08-15 ENCOUNTER — Other Ambulatory Visit: Payer: Self-pay

## 2019-08-15 ENCOUNTER — Other Ambulatory Visit: Payer: Self-pay | Admitting: Infectious Disease

## 2019-08-15 VITALS — BP 139/82 | HR 90 | Wt 240.0 lb

## 2019-08-15 DIAGNOSIS — F640 Transsexualism: Secondary | ICD-10-CM

## 2019-08-15 DIAGNOSIS — F418 Other specified anxiety disorders: Secondary | ICD-10-CM

## 2019-08-15 DIAGNOSIS — B20 Human immunodeficiency virus [HIV] disease: Secondary | ICD-10-CM

## 2019-08-15 DIAGNOSIS — L732 Hidradenitis suppurativa: Secondary | ICD-10-CM | POA: Diagnosis not present

## 2019-08-15 DIAGNOSIS — I639 Cerebral infarction, unspecified: Secondary | ICD-10-CM | POA: Diagnosis not present

## 2019-08-15 DIAGNOSIS — E669 Obesity, unspecified: Secondary | ICD-10-CM

## 2019-08-15 DIAGNOSIS — Z23 Encounter for immunization: Secondary | ICD-10-CM

## 2019-08-15 DIAGNOSIS — F17200 Nicotine dependence, unspecified, uncomplicated: Secondary | ICD-10-CM | POA: Diagnosis not present

## 2019-08-15 DIAGNOSIS — E1169 Type 2 diabetes mellitus with other specified complication: Secondary | ICD-10-CM | POA: Diagnosis not present

## 2019-08-15 DIAGNOSIS — Z981 Arthrodesis status: Secondary | ICD-10-CM

## 2019-08-15 DIAGNOSIS — Z789 Other specified health status: Secondary | ICD-10-CM

## 2019-08-15 MED ORDER — ESTRADIOL 2 MG PO TABS: 2.0000 mg | ORAL_TABLET | Freq: Every day | ORAL | 11 refills | Status: DC

## 2019-08-15 MED ORDER — SPIRONOLACTONE 100 MG PO TABS: 100.0000 mg | ORAL_TABLET | Freq: Every day | ORAL | 11 refills | Status: DC

## 2019-08-15 MED ORDER — BIKTARVY 50-200-25 MG PO TABS: 1.0000 | ORAL_TABLET | Freq: Every day | ORAL | 11 refills | Status: DC

## 2019-08-15 MED FILL — SPIRONOLACTONE 100 MG TAB: 100 | 30 days supply | Qty: 30 | Fill #0

## 2019-08-15 MED FILL — ESTRADIOL 2 MG TABLET: 2 | 30 days supply | Qty: 30 | Fill #0

## 2019-08-15 NOTE — Progress Notes (Signed)
Subjective:  Chief complaint: She would like to start estrogen therapy  Patient ID: [name], adult    DOB: 1970/10/29, 49 y.o.   MRN: [id number]  HPI  Ask is a 49 year old Caucasian transgender male living with HIV that has been perfectly suppressed on Biktarvy.  She does suffer from comorbid obesity diabetes mellitus and prior smoking.  She did suffer a CVA in the past with an ischemic stroke.  He underwent lumbar surgery last year and has stopped smoking cigarettes altogether through the use of Chantix.  She is currently taking spironolactone at a dose of 50 mg to help with transgender male to male transition.  She is interested in initiating estrogen therapy now that she has stopped smoking and there is less of a risk of a thromboembolic event.  He is also not immune to hepatitis based on vaccine so that is been given and based on surface antibodies have been tested.  Think she would be an excellent candidate to enroll into the [employer] study of hepatitis B vaccines.  With regards to her diabetes mellitus her A1c has come down to 6.9.  This happened in the context of her getting rid of insulin and her other oral diabetic medication and staying only on Metformin and losing weight.  I commended weight loss and I feel this is clearly the pathway for her to potentially cure her diabetes mellitus.    Past Medical History:  Diagnosis Date  . Asthma   . Depression   . Diabetes mellitus   . GERD (gastroesophageal reflux disease)   . HIV disease ([place])   . Hypertension   . 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 Perirectal abscess 03/30/2017  . Pneumonia   . Polyuria 04/27/2015  . Poorly controlled diabetes mellitus ([place]) 04/27/2015  . Testicular mass 06/20/2016  . Transgender 04/27/2015    Past Surgical History:  Procedure Laterality Date  . APPENDECTOMY    . TRANSFORAMINAL LUMBAR INTERBODY FUSION (TLIF) WITH PEDICLE SCREW FIXATION 3 LEVEL N/A 01/09/2019   Procedure: TRANSFORAMINAL  LUMBAR INTERBODY FUSION (TLIF) WITH PEDICLE SCREW FIXATION 3 LEVEL, POSTERIOR SPINAL FUSION;  Surgeon: [name], MD;  Location: [facility] ORS;  Service: Neurosurgery;  Laterality: N/A;    Family History  Problem Relation Age of Onset  . Heart failure Father   . Diabetes Father       Social History   Socioeconomic History  . Marital status: Single    Spouse name: Not on file  . Number of children: Not on file  . Years of education: Not on file  . Highest education level: Not on file  Occupational History  . Not on file  Tobacco Use  . Smoking status: Former Smoker    Packs/day: 0.10    Years: 27.00    Pack years: 2.70    Types: Cigarettes    Quit date: 07/19/2018    Years since quitting: 1.0  . Smokeless tobacco: Never Used  . Tobacco comment: using chantix, down to 3/day  Substance and Sexual Activity  . Alcohol use: No    Alcohol/week: 0.0 standard drinks  . Drug use: Yes    Frequency: 7.0 times per week    Types: Marijuana  . Sexual activity: Yes    Partners: Male    Comment: pt. given condoms  Other Topics Concern  . Not on file  Social History Narrative  . Not on file   Social Determinants of Health   Financial Resource Strain:   .  Difficulty of Paying Living Expenses: Not on file  Food Insecurity:   . Worried About Charity fundraiser in the Last Year: Not on file  . Ran Out of Food in the Last Year: Not on file  Transportation Needs:   . Lack of Transportation (Medical): Not on file  . Lack of Transportation (Non-Medical): Not on file  Physical Activity:   . Days of Exercise per Week: Not on file  . Minutes of Exercise per Session: Not on file  Stress:   . Feeling of Stress : Not on file  Social Connections:   . Frequency of Communication with Friends and Family: Not on file  . Frequency of Social Gatherings with Friends and Family: Not on file  . Attends Religious Services: Not on file  . Active Member of Clubs or Organizations: Not on file  .  Attends Archivist Meetings: Not on file  . Marital Status: Not on file    Allergies  Allergen Reactions  . Propoxyphene N-Acetaminophen Other (See Comments)    Stomach cramps     Current Outpatient Medications:  .  BIKTARVY 50-200-25 MG TABS tablet, TAKE 1 TABLET BY MOUTH DAILY., Disp: 30 tablet, Rfl: 5 .  ACCU-CHEK SOFTCLIX LANCETS lancets, 1 each by Other route 3 (three) times daily., Disp: 100 each, Rfl: 5 .  albuterol (VENTOLIN HFA) 108 (90 Base) MCG/ACT inhaler, Inhale 2 puffs into the lungs every 6 (six) hours as needed for wheezing or shortness of breath., Disp: 6.7 g, Rfl: 1 .  blood glucose meter kit and supplies, Dispense based on patient and insurance preference. Use up to four times daily as directed. (FOR ICD-10 E10.9, E11.9)., Disp: 1 each, Rfl: 0 .  celecoxib (CELEBREX) 200 MG capsule, Take 1 capsule (200 mg total) by mouth 2 (two) times daily. (Patient not taking: Reported on 05/08/2019), Disp: 60 capsule, Rfl: 0 .  escitalopram (LEXAPRO) 20 MG tablet, Take 1 tablet (20 mg total) by mouth at bedtime., Disp: 30 tablet, Rfl: 3 .  gabapentin (NEURONTIN) 300 MG capsule, Take by mouth., Disp: , Rfl:  .  glimepiride (AMARYL) 4 MG tablet, Take 1 tablet (4 mg total) by mouth daily before breakfast., Disp: 90 tablet, Rfl: 0 .  Insulin Glargine (LANTUS SOLOSTAR) 100 UNIT/ML Solostar Pen, Inject 40 Units into the skin at bedtime., Disp: 15 mL, Rfl: 3 .  Insulin Pen Needle (PEN NEEDLES) 31G X 8 MM MISC, Inject 1 each into the skin daily., Disp: 100 each, Rfl: 1 .  Melatonin 5 MG CAPS, Take 1 capsule (5 mg total) by mouth at bedtime as needed., Disp: 30 capsule, Rfl: 3 .  metFORMIN (GLUCOPHAGE) 1000 MG tablet, Take 1 tablet (1,000 mg total) by mouth 2 (two) times daily with a meal., Disp: 60 tablet, Rfl: 3 .  methocarbamol (ROBAXIN) 500 MG tablet, Take 1-2 tablets (500-1,000 mg total) by mouth 4 (four) times daily., Disp: 90 tablet, Rfl: 0 .  spironolactone (ALDACTONE) 50 MG  tablet, Take 1 tablet (50 mg total) by mouth daily., Disp: 30 tablet, Rfl: 5   Review of Systems  Constitutional: Negative for chills and fever.  HENT: Negative for congestion and sore throat.   Eyes: Negative for photophobia.  Respiratory: Negative for cough, shortness of breath and wheezing.   Cardiovascular: Negative for chest pain, palpitations and leg swelling.  Gastrointestinal: Negative for abdominal pain, blood in stool, constipation, diarrhea, nausea and vomiting.  Genitourinary: Negative for dysuria, flank pain and hematuria.  Musculoskeletal: Negative for  back pain and myalgias.  Skin: Negative for rash.  Neurological: Negative for dizziness, weakness and headaches.  Hematological: Does not bruise/bleed easily.  Psychiatric/Behavioral: Negative for agitation, behavioral problems, confusion, decreased concentration, dysphoric mood, self-injury and suicidal ideas. The patient is not nervous/anxious.        Objective:   Physical Exam Constitutional:      General: She is not in acute distress.    Appearance: She is well-developed. She is not diaphoretic.  HENT:     Head: Normocephalic and atraumatic.     Mouth/Throat:     Pharynx: No oropharyngeal exudate.  Eyes:     General: No scleral icterus.    Conjunctiva/sclera: Conjunctivae normal.  Cardiovascular:     Rate and Rhythm: Normal rate and regular rhythm.  Pulmonary:     Effort: Pulmonary effort is normal. No respiratory distress.     Breath sounds: No wheezing.  Abdominal:     General: There is no distension.  Musculoskeletal:        General: No tenderness. Normal range of motion.     Cervical back: Normal range of motion and neck supple.  Skin:    General: Skin is warm and dry.     Coloration: Skin is not pale.     Findings: No erythema or rash.  Neurological:     General: No focal deficit present.     Mental Status: She is alert and oriented to person, place, and time.     Motor: No abnormal muscle tone.      Coordination: Coordination normal.  Psychiatric:        Mood and Affect: Mood normal.        Behavior: Behavior normal.        Thought Content: Thought content normal.        Judgment: Judgment normal.           Assessment & Plan:  HIV disease: Continue Biktarvy return to clinic in 6 months time  Lack of hepatitis B immunity: He is interested in the study and would like to enroll her in the A[employer] vaccine study of hepatitis B  Diabetes mellitus: Coming under greater control with greater weight loss this could be potentially cured even  Obesity: She is continue to lose weight  Transgender male to male: I am increasing her spironolactone to 100 mg and will have her recheck a metabolic panel and blood pressure in 2 weeks time.  I am also initiating estradiol at 2 mg daily.  We will check a serum testosterone when she comes back for labs in 6 months time.

## 2019-08-15 NOTE — Patient Instructions (Signed)
You need a lab and RN visit for checking BMP and BP

## 2019-08-22 ENCOUNTER — Encounter: Payer: Medicaid Other | Admitting: *Deleted

## 2019-08-27 ENCOUNTER — Encounter (INDEPENDENT_AMBULATORY_CARE_PROVIDER_SITE_OTHER): Payer: Self-pay | Admitting: *Deleted

## 2019-08-27 ENCOUNTER — Other Ambulatory Visit: Payer: Self-pay

## 2019-08-27 VITALS — BP 140/93 | HR 97 | Temp 99.2°F | Wt 243.1 lb

## 2019-08-27 DIAGNOSIS — Z006 Encounter for examination for normal comparison and control in clinical research program: Secondary | ICD-10-CM

## 2019-08-27 NOTE — Research (Signed)
[name] was here today to screen for the [name] study (hep B vaccine). Informed consent was obtained after she had a chance to read over the consent and we discussed it in detail and answered all her questions. She denies any new issues or complaints since seeing Dr. [name] on 08/15/19. She is currently living in [place] but didn't know the address, so she gave me her sister's address in [place] where she stays sometimes to calculate mileage. She had lumbar fusion last year which has worked very well for her and taken care of most of her pain issues. She did just start taking estrace at the first of this month, she was already taking aldactone for gender transformation. We have planned entry for 3/30.

## 2019-08-28 ENCOUNTER — Telehealth: Payer: Self-pay | Admitting: Family

## 2019-08-28 LAB — CBC WITH DIFFERENTIAL/PLATELET
Absolute Monocytes: 1200 cells/uL — ABNORMAL HIGH (ref 200–950)
Basophils Absolute: 77 cells/uL (ref 0–200)
Basophils Relative: 0.6 %
Eosinophils Absolute: 245 cells/uL (ref 15–500)
Eosinophils Relative: 1.9 %
HCT: 53.7 % — ABNORMAL HIGH (ref 38.5–50.0)
Hemoglobin: 18.7 g/dL — ABNORMAL HIGH (ref 13.2–17.1)
Lymphs Abs: 4051 cells/uL — ABNORMAL HIGH (ref 850–3900)
MCH: 32.8 pg (ref 27.0–33.0)
MCHC: 34.8 g/dL (ref 32.0–36.0)
MCV: 94.2 fL (ref 80.0–100.0)
MPV: 11.2 fL (ref 7.5–12.5)
Monocytes Relative: 9.3 %
Neutro Abs: 7327 cells/uL (ref 1500–7800)
Neutrophils Relative %: 56.8 %
Platelets: 324 10*3/uL (ref 140–400)
RBC: 5.7 10*6/uL (ref 4.20–5.80)
RDW: 11.5 % (ref 11.0–15.0)
Total Lymphocyte: 31.4 %
WBC: 12.9 10*3/uL — ABNORMAL HIGH (ref 3.8–10.8)

## 2019-08-28 LAB — COMPREHENSIVE METABOLIC PANEL
AG Ratio: 1.5 (calc) (ref 1.0–2.5)
ALT: 35 U/L (ref 9–46)
AST: 22 U/L (ref 10–40)
Albumin: 4.7 g/dL (ref 3.6–5.1)
Alkaline phosphatase (APISO): 102 U/L (ref 36–130)
BUN: 18 mg/dL (ref 7–25)
CO2: 26 mmol/L (ref 20–32)
Calcium: 10.3 mg/dL (ref 8.6–10.3)
Chloride: 97 mmol/L — ABNORMAL LOW (ref 98–110)
Creat: 0.92 mg/dL (ref 0.60–1.35)
Globulin: 3.2 g/dL (calc) (ref 1.9–3.7)
Glucose, Bld: 316 mg/dL — ABNORMAL HIGH (ref 65–99)
Potassium: 4.7 mmol/L (ref 3.5–5.3)
Sodium: 136 mmol/L (ref 135–146)
Total Bilirubin: 0.4 mg/dL (ref 0.2–1.2)
Total Protein: 7.9 g/dL (ref 6.1–8.1)

## 2019-08-28 LAB — HEPATITIS B SURFACE ANTIBODY,QUALITATIVE: Hep B S Ab: NONREACTIVE

## 2019-08-28 LAB — CK: Total CK: 49 U/L (ref 44–196)

## 2019-08-28 LAB — HEMOGLOBIN A1C
Hgb A1c MFr Bld: 8.8 % of total Hgb — ABNORMAL HIGH (ref <5.7)
Mean Plasma Glucose: 206 (calc)
eAG (mmol/L): 11.4 (calc)

## 2019-08-28 LAB — BILIRUBIN, DIRECT: Bilirubin, Direct: 0.1 mg/dL (ref 0.0–0.2)

## 2019-08-28 LAB — HEPATITIS B SURFACE ANTIGEN: Hepatitis B Surface Ag: NONREACTIVE

## 2019-08-28 LAB — PROTIME-INR
INR: 1.1
Prothrombin Time: 11.3 s (ref 9.0–11.5)

## 2019-08-28 LAB — HEPATITIS B CORE ANTIBODY, TOTAL: Hep B Core Total Ab: NONREACTIVE

## 2019-08-28 LAB — PHOSPHORUS: Phosphorus: 3.7 mg/dL (ref 2.5–4.5)

## 2019-08-28 NOTE — Telephone Encounter (Signed)
Patient wanted to know if she should change dressing everyday or wait until Monday when she comes back.  Please call patient to advise.  [phone]

## 2019-08-29 NOTE — Telephone Encounter (Signed)
I called and lm on vm to advise that I do not see that we have seen her in the office nor in the hospital and there are no upcoming or past appts for this pt. I advised that I did see an appt with infectious dx 09/10/19 and perhaps that is the office she was trying to reach. To call the office if I am mistaken but id o not see a surgical note or anything pertaining to a dressing order from Dr. Lajoyce Corners.

## 2019-09-04 ENCOUNTER — Other Ambulatory Visit (INDEPENDENT_AMBULATORY_CARE_PROVIDER_SITE_OTHER): Payer: Self-pay | Admitting: Primary Care

## 2019-09-04 NOTE — Telephone Encounter (Signed)
Sent to PCP ?

## 2019-09-05 MED FILL — METFORMIN HCL 1000 MG TABS: 1000 | 30 days supply | Qty: 60 | Fill #0

## 2019-09-06 MED FILL — BIKTARVY 50-200-25 MG TABS: 50-200-25 | 30 days supply | Qty: 30 | Fill #0

## 2019-09-06 MED FILL — GABAPENTIN 300 MG CAPSULE: 300 | 30 days supply | Qty: 150 | Fill #0

## 2019-09-06 MED FILL — ESCITALOPRAM 20 MG TABLET: 20 | 30 days supply | Qty: 30 | Fill #3

## 2019-09-10 ENCOUNTER — Encounter: Payer: Medicaid Other | Admitting: *Deleted

## 2019-09-16 ENCOUNTER — Encounter: Payer: Self-pay | Admitting: Infectious Disease

## 2019-09-16 LAB — CD4/CD8 (T-HELPER/T-SUPPRESSOR CELL)
CD4 % Helper T Cell: 54.3
CD4 Count: 2552
CD8 % Suppressor T Cell: 25.2
CD8 T Cell Abs: 1184
HIV 1 RNA UltraQuant: <40

## 2019-09-18 ENCOUNTER — Encounter: Payer: Self-pay | Admitting: Infectious Disease

## 2019-09-18 ENCOUNTER — Other Ambulatory Visit: Payer: Self-pay

## 2019-09-18 ENCOUNTER — Encounter (INDEPENDENT_AMBULATORY_CARE_PROVIDER_SITE_OTHER): Payer: Self-pay | Admitting: *Deleted

## 2019-09-18 VITALS — BP 126/86 | HR 88 | Temp 97.7°F | Resp 16 | Wt 246.5 lb

## 2019-09-18 DIAGNOSIS — Z006 Encounter for examination for normal comparison and control in clinical research program: Secondary | ICD-10-CM

## 2019-09-18 LAB — COMPREHENSIVE METABOLIC PANEL
AG Ratio: 1.7 (calc) (ref 1.0–2.5)
ALT: 28 U/L (ref 9–46)
AST: 20 U/L (ref 10–40)
Albumin: 4.5 g/dL (ref 3.6–5.1)
Alkaline phosphatase (APISO): 88 U/L (ref 36–130)
BUN: 10 mg/dL (ref 7–25)
CO2: 26 mmol/L (ref 20–32)
Calcium: 9.3 mg/dL (ref 8.6–10.3)
Chloride: 103 mmol/L (ref 98–110)
Creat: 0.76 mg/dL (ref 0.60–1.35)
Globulin: 2.7 g/dL (calc) (ref 1.9–3.7)
Glucose, Bld: 180 mg/dL — ABNORMAL HIGH (ref 65–99)
Potassium: 4.3 mmol/L (ref 3.5–5.3)
Sodium: 138 mmol/L (ref 135–146)
Total Bilirubin: 0.3 mg/dL (ref 0.2–1.2)
Total Protein: 7.2 g/dL (ref 6.1–8.1)

## 2019-09-18 LAB — PHOSPHORUS: Phosphorus: 3.9 mg/dL (ref 2.5–4.5)

## 2019-09-18 LAB — CK: Total CK: 69 U/L (ref 44–196)

## 2019-09-18 LAB — BILIRUBIN, DIRECT: Bilirubin, Direct: 0.1 mg/dL (ref 0.0–0.2)

## 2019-09-18 NOTE — Research (Signed)
[name] was here today to enroll in the Hep B vaccine study [name]. She was randomized to group A, arm 2, the 3 dose Heplisav vaccine arm after her eligibility was reviewed again. She denies any new problems or concerns. She did start her glimeride back when she found out her A1c was going back up. She has not had the Covid vaccine yet. She was given the first vaccine in her rt deltoid without any problem. At the Post 30 min, assessment, she denied any side effects. Temp was 97.7. She was given a diary card, thermometer and ruler and instructed in their use over the next week. She is to return in 4 weeks.

## 2019-10-08 MED FILL — ESCITALOPRAM 20 MG TABLET: 20 | 30 days supply | Qty: 30 | Fill #4

## 2019-10-08 MED FILL — ESTRADIOL 2 MG TABLET: 2 | 30 days supply | Qty: 30 | Fill #1

## 2019-10-08 MED FILL — METFORMIN HCL 1000 MG TABS: 1000 | 30 days supply | Qty: 60 | Fill #1

## 2019-10-08 MED FILL — BIKTARVY 50-200-25 MG TABS: 50-200-25 | 30 days supply | Qty: 30 | Fill #1

## 2019-10-08 MED FILL — GABAPENTIN 300 MG CAPSULE: 300 | 30 days supply | Qty: 150 | Fill #1

## 2019-10-10 DIAGNOSIS — M4326 Fusion of spine, lumbar region: Secondary | ICD-10-CM | POA: Diagnosis not present

## 2019-10-10 DIAGNOSIS — M5416 Radiculopathy, lumbar region: Secondary | ICD-10-CM | POA: Diagnosis not present

## 2019-10-10 DIAGNOSIS — Z981 Arthrodesis status: Secondary | ICD-10-CM | POA: Diagnosis not present

## 2019-10-11 ENCOUNTER — Encounter: Payer: Self-pay | Admitting: Infectious Disease

## 2019-10-11 LAB — CD4/CD8 (T-HELPER/T-SUPPRESSOR CELL)
ANC (CHCC manual diff): 6477
CD4 % Helper T Cell: 50.8
CD4 Count: 2337
CD8 % Suppressor T Cell: 27.6
CD8 T Cell Abs: 1270
HIV 1 RNA UltraQuant: <40

## 2019-10-16 ENCOUNTER — Encounter (INDEPENDENT_AMBULATORY_CARE_PROVIDER_SITE_OTHER): Payer: Self-pay | Admitting: *Deleted

## 2019-10-16 ENCOUNTER — Other Ambulatory Visit: Payer: Self-pay

## 2019-10-16 VITALS — BP 129/87 | HR 101 | Temp 99.6°F | Wt 249.4 lb

## 2019-10-16 DIAGNOSIS — Z006 Encounter for examination for normal comparison and control in clinical research program: Secondary | ICD-10-CM

## 2019-10-16 MED FILL — SPIRONOLACTONE 100 MG TAB: 100 | 30 days supply | Qty: 30 | Fill #1

## 2019-10-16 NOTE — Research (Signed)
[name] was here today for the week 4 visit for the [name] study. She denies any new problems or an ISR. She was given the 2nd dose of Heplisav vaccine today and will record any reactions for the next 7 days and return in 1 month.

## 2019-10-17 LAB — HEPATIC FUNCTION PANEL
AG Ratio: 1.6 (calc) (ref 1.0–2.5)
ALT: 23 U/L (ref 9–46)
AST: 17 U/L (ref 10–40)
Albumin: 4.2 g/dL (ref 3.6–5.1)
Alkaline phosphatase (APISO): 92 U/L (ref 36–130)
Bilirubin, Direct: 0.1 mg/dL (ref 0.0–0.2)
Globulin: 2.7 g/dL (calc) (ref 1.9–3.7)
Indirect Bilirubin: 0.2 mg/dL (calc) (ref 0.2–1.2)
Total Bilirubin: 0.3 mg/dL (ref 0.2–1.2)
Total Protein: 6.9 g/dL (ref 6.1–8.1)

## 2019-11-13 ENCOUNTER — Other Ambulatory Visit: Payer: Self-pay

## 2019-11-13 ENCOUNTER — Encounter (INDEPENDENT_AMBULATORY_CARE_PROVIDER_SITE_OTHER): Payer: Self-pay | Admitting: *Deleted

## 2019-11-13 VITALS — BP 136/85 | HR 90 | Temp 97.7°F | Wt 239.2 lb

## 2019-11-13 DIAGNOSIS — Z006 Encounter for examination for normal comparison and control in clinical research program: Secondary | ICD-10-CM

## 2019-11-13 LAB — CBC WITH DIFFERENTIAL/PLATELET
Absolute Monocytes: 1067 cells/uL — ABNORMAL HIGH (ref 200–950)
Basophils Absolute: 93 cells/uL (ref 0–200)
Basophils Relative: 0.8 %
Eosinophils Absolute: 429 cells/uL (ref 15–500)
Eosinophils Relative: 3.7 %
HCT: 48.8 % (ref 38.5–50.0)
Hemoglobin: 16.6 g/dL (ref 13.2–17.1)
Lymphs Abs: 4292 cells/uL — ABNORMAL HIGH (ref 850–3900)
MCH: 32.7 pg (ref 27.0–33.0)
MCHC: 34 g/dL (ref 32.0–36.0)
MCV: 96.3 fL (ref 80.0–100.0)
MPV: 11.4 fL (ref 7.5–12.5)
Monocytes Relative: 9.2 %
Neutro Abs: 5719 cells/uL (ref 1500–7800)
Neutrophils Relative %: 49.3 %
Platelets: 337 10*3/uL (ref 140–400)
RBC: 5.07 10*6/uL (ref 4.20–5.80)
RDW: 11.8 % (ref 11.0–15.0)
Total Lymphocyte: 37 %
WBC: 11.6 10*3/uL — ABNORMAL HIGH (ref 3.8–10.8)

## 2019-11-13 LAB — HEPATIC FUNCTION PANEL
AG Ratio: 1.4 (calc) (ref 1.0–2.5)
ALT: 36 U/L (ref 9–46)
AST: 24 U/L (ref 10–40)
Albumin: 4 g/dL (ref 3.6–5.1)
Alkaline phosphatase (APISO): 87 U/L (ref 36–130)
Bilirubin, Direct: 0.1 mg/dL (ref 0.0–0.2)
Globulin: 2.8 g/dL (calc) (ref 1.9–3.7)
Indirect Bilirubin: 0.3 mg/dL (calc) (ref 0.2–1.2)
Total Bilirubin: 0.4 mg/dL (ref 0.2–1.2)
Total Protein: 6.8 g/dL (ref 6.1–8.1)

## 2019-11-13 NOTE — Research (Signed)
[name] was here for her week 8 visit for [name]. She denies any side effects from the last vaccine or any new problems or medications. She will be returning in 4 weeks for the next visit.

## 2019-11-15 MED FILL — SPIRONOLACTONE 100 MG TAB: 100 | 30 days supply | Qty: 30 | Fill #2

## 2019-11-15 MED FILL — BIKTARVY 50-200-25 MG TABS: 50-200-25 | 30 days supply | Qty: 30 | Fill #2

## 2019-11-15 MED FILL — ESTRADIOL 2 MG TABLET: 2 | 30 days supply | Qty: 30 | Fill #2

## 2019-11-15 MED FILL — METFORMIN HCL 1000 MG TABS: 1000 | 30 days supply | Qty: 60 | Fill #2

## 2019-11-15 MED FILL — GABAPENTIN 300 MG CAPSULE: 300 | 30 days supply | Qty: 150 | Fill #2

## 2019-11-15 MED FILL — ESCITALOPRAM 20 MG TABLET: 20 | 30 days supply | Qty: 30 | Fill #5

## 2019-12-11 ENCOUNTER — Other Ambulatory Visit: Payer: Self-pay | Admitting: Infectious Disease

## 2019-12-11 ENCOUNTER — Other Ambulatory Visit (INDEPENDENT_AMBULATORY_CARE_PROVIDER_SITE_OTHER): Payer: Self-pay | Admitting: Primary Care

## 2019-12-11 DIAGNOSIS — F321 Major depressive disorder, single episode, moderate: Secondary | ICD-10-CM

## 2019-12-12 ENCOUNTER — Other Ambulatory Visit (HOSPITAL_COMMUNITY): Payer: Self-pay | Admitting: Neurosurgery

## 2019-12-12 MED FILL — GABAPENTIN 300 MG CAPSULE: 300 | 30 days supply | Qty: 150 | Fill #0

## 2019-12-12 MED FILL — BIKTARVY 50-200-25 MG TABS: 50-200-25 | 30 days supply | Qty: 30 | Fill #3

## 2019-12-12 MED FILL — SPIRONOLACTONE 100 MG TAB: 100 | 30 days supply | Qty: 30 | Fill #3

## 2019-12-12 MED FILL — METFORMIN HCL 1000 MG TABS: 1000 | 30 days supply | Qty: 60 | Fill #3

## 2019-12-12 MED FILL — ESTRADIOL 2 MG TABLET: 2 | 30 days supply | Qty: 30 | Fill #3

## 2019-12-13 ENCOUNTER — Other Ambulatory Visit: Payer: Self-pay

## 2019-12-13 ENCOUNTER — Encounter (INDEPENDENT_AMBULATORY_CARE_PROVIDER_SITE_OTHER): Payer: Self-pay | Admitting: *Deleted

## 2019-12-13 VITALS — BP 126/83 | HR 94 | Temp 99.2°F | Wt 246.5 lb

## 2019-12-13 DIAGNOSIS — Z006 Encounter for examination for normal comparison and control in clinical research program: Secondary | ICD-10-CM

## 2019-12-13 NOTE — Research (Signed)
[name] was here for her week 12 visit for  A5379. She denies any new problems or medications. She will be returning in 12 weeks for the next vaccine visit.

## 2019-12-26 ENCOUNTER — Encounter: Payer: Medicaid Other | Admitting: *Deleted

## 2020-01-13 MED FILL — SPIRONOLACTONE 100 MG TAB: 100 | 30 days supply | Qty: 30 | Fill #4

## 2020-01-13 MED FILL — ESTRADIOL 2 MG TABLET: 2 | 30 days supply | Qty: 30 | Fill #4

## 2020-01-13 MED FILL — BIKTARVY 50-200-25 MG TABS: 50-200-25 | 30 days supply | Qty: 30 | Fill #4

## 2020-01-13 MED FILL — GABAPENTIN 300 MG CAPSULE: 300 | 30 days supply | Qty: 150 | Fill #1

## 2020-02-05 ENCOUNTER — Other Ambulatory Visit: Payer: Medicaid Other

## 2020-02-11 ENCOUNTER — Other Ambulatory Visit: Payer: Medicaid Other

## 2020-02-13 MED FILL — BIKTARVY 50-200-25 MG TABS: 50-200-25 | 30 days supply | Qty: 30 | Fill #5

## 2020-02-13 MED FILL — GABAPENTIN 300 MG CAPSULE: 300 | 30 days supply | Qty: 150 | Fill #2

## 2020-02-13 MED FILL — ESTRADIOL 2 MG TABLET: 2 | 30 days supply | Qty: 30 | Fill #5

## 2020-02-13 MED FILL — SPIRONOLACTONE 100 MG TAB: 100 | 30 days supply | Qty: 30 | Fill #5

## 2020-02-19 ENCOUNTER — Encounter: Payer: Medicaid Other | Admitting: Infectious Disease

## 2020-02-26 ENCOUNTER — Encounter: Payer: Medicaid Other | Admitting: Infectious Disease

## 2020-03-04 ENCOUNTER — Encounter: Payer: Medicaid Other | Admitting: *Deleted

## 2020-03-13 DIAGNOSIS — Z419 Encounter for procedure for purposes other than remedying health state, unspecified: Secondary | ICD-10-CM | POA: Diagnosis not present

## 2020-03-16 ENCOUNTER — Encounter (INDEPENDENT_AMBULATORY_CARE_PROVIDER_SITE_OTHER): Payer: Self-pay | Admitting: *Deleted

## 2020-03-16 ENCOUNTER — Other Ambulatory Visit: Payer: Self-pay

## 2020-03-16 VITALS — BP 130/90 | HR 102 | Temp 98.4°F | Wt 236.2 lb

## 2020-03-16 DIAGNOSIS — Z006 Encounter for examination for normal comparison and control in clinical research program: Secondary | ICD-10-CM

## 2020-03-16 NOTE — Research (Signed)
[name] was here today for her week 24 visit for A5379. She has been out of town the past month working in [place] and will be leaving for [place] in a few weeks and coming back the end of October. She is a few days late for the 3rd vaccine due to her schedule. She did receive her covid pfizer vaccines since she was here last. She was given her 3rd injection of heplisav and did not have any side effects in post 30 minutes. She will be coming back 04/13/20 for the next study visit.

## 2020-03-20 ENCOUNTER — Other Ambulatory Visit (INDEPENDENT_AMBULATORY_CARE_PROVIDER_SITE_OTHER): Payer: Self-pay | Admitting: Primary Care

## 2020-03-20 MED FILL — BIKTARVY 50-200-25 MG TABS: 50-200-25 | 30 days supply | Qty: 30 | Fill #5

## 2020-03-20 MED FILL — METFORMIN HCL 1000 MG TABS: 1000 | 30 days supply | Qty: 60 | Fill #0

## 2020-03-20 MED FILL — GABAPENTIN 300 MG CAPSULE: 300 | 30 days supply | Qty: 150 | Fill #2

## 2020-03-20 MED FILL — ESTRADIOL 2 MG TABLET: 2 | 30 days supply | Qty: 30 | Fill #5

## 2020-03-20 MED FILL — SPIRONOLACTONE 100 MG TAB: 100 | 30 days supply | Qty: 30 | Fill #5

## 2020-03-20 NOTE — Telephone Encounter (Signed)
Requested Prescriptions  Pending Prescriptions Disp Refills  . metFORMIN (GLUCOPHAGE) 1000 MG tablet [Pharmacy Med Name: METFORMIN HCL 1000 MG TABS 1000 Tablet] 60 tablet 0    Sig: TAKE 1 TABLET BY MOUTH TWICE DAILY WITH MEALS     Endocrinology:  Diabetes - Biguanides Failed - 03/20/2020 10:38 AM      Failed - HBA1C is between 0 and 7.9 and within 180 days    Hgb A1c MFr Bld  Date Value Ref Range Status  08/27/2019 8.8 (H) <5.7 % of total Hgb Final    Comment:    For someone without known diabetes, a hemoglobin A1c value of 6.5% or greater indicates that they may have  diabetes and this should be confirmed with a follow-up  test. . For someone with known diabetes, a value <7% indicates  that their diabetes is well controlled and a value  greater than or equal to 7% indicates suboptimal  control. A1c targets should be individualized based on  duration of diabetes, age, comorbid conditions, and  other considerations. . Currently, no consensus exists regarding use of hemoglobin A1c for diagnosis of diabetes for children. .          Failed - eGFR in normal range and within 360 days    GFR, Est African American  Date Value Ref Range Status  08/31/2010 >60 >60 mL/min Final   GFR calc Af Amer  Date Value Ref Range Status  11/23/2018 >60 >60 mL/min Final   GFR, Est Non African American  Date Value Ref Range Status  08/31/2010 >60 >60 mL/min Final   GFR calc non Af Amer  Date Value Ref Range Status  11/23/2018 >60 >60 mL/min Final         Failed - Valid encounter within last 6 months    Recent Outpatient Visits          10 months ago Type 2 diabetes mellitus without complication, with long-term current use of insulin ([place])   [place] [facility] [name], NP   1 year ago Depression with anxiety   [place], [place], NP   1 year ago Type 2 diabetes mellitus without complication, with long-term current use of  insulin ([place])   [place] Clent Demark, PA-C   1 year ago Pre-operative clearance   [place] Clent Demark, PA-C   2 years ago Pneumonia of left lower lobe due to infectious organism [facility])   [place], [place] David, PA-C      Future Appointments            In 3 days [name], NP [place]   In 1 month [name], [name], MD [facility]           Passed - Cr in normal range and within 360 days    Creat  Date Value Ref Range Status  09/18/2019 0.76 0.60 - 1.35 mg/dL Final   Creatinine,U  Date Value Ref Range Status  09/02/2014 214.11 mg/dL Final    Comment:       Cutoff Values for Urine Drug Screen, Pain Mgmt          Drug Class           Cutoff (ng/mL)          Amphetamines  500          Barbiturates             200          Cocaine Metabolites      150          Benzodiazepines          200          Methadone                300          Opiates                  300          Phencyclidine             25          Propoxyphene             300          Marijuana Metabolites     50    For medical purposes only.    Creatinine, Urine  Date Value Ref Range Status  07/12/2019 114 20 - 320 mg/dL Final         called and schedule patient for OV for medication refills. Given 30 day courtesy refill.

## 2020-03-23 ENCOUNTER — Encounter (INDEPENDENT_AMBULATORY_CARE_PROVIDER_SITE_OTHER): Payer: Self-pay | Admitting: Primary Care

## 2020-03-23 ENCOUNTER — Other Ambulatory Visit (INDEPENDENT_AMBULATORY_CARE_PROVIDER_SITE_OTHER): Payer: Self-pay | Admitting: Primary Care

## 2020-03-23 ENCOUNTER — Ambulatory Visit (INDEPENDENT_AMBULATORY_CARE_PROVIDER_SITE_OTHER): Payer: Medicaid Other | Admitting: Primary Care

## 2020-03-23 ENCOUNTER — Other Ambulatory Visit: Payer: Self-pay

## 2020-03-23 VITALS — BP 108/76 | HR 95 | Temp 97.5°F | Ht 71.0 in | Wt 235.4 lb

## 2020-03-23 DIAGNOSIS — Z794 Long term (current) use of insulin: Secondary | ICD-10-CM

## 2020-03-23 DIAGNOSIS — J4520 Mild intermittent asthma, uncomplicated: Secondary | ICD-10-CM

## 2020-03-23 DIAGNOSIS — Z23 Encounter for immunization: Secondary | ICD-10-CM | POA: Diagnosis not present

## 2020-03-23 DIAGNOSIS — E119 Type 2 diabetes mellitus without complications: Secondary | ICD-10-CM | POA: Diagnosis not present

## 2020-03-23 DIAGNOSIS — F321 Major depressive disorder, single episode, moderate: Secondary | ICD-10-CM | POA: Diagnosis not present

## 2020-03-23 DIAGNOSIS — F17200 Nicotine dependence, unspecified, uncomplicated: Secondary | ICD-10-CM

## 2020-03-23 LAB — POCT GLYCOSYLATED HEMOGLOBIN (HGB A1C): Hemoglobin A1C: 9.8 % — AB (ref 4.0–5.6)

## 2020-03-23 LAB — GLUCOSE, POCT (MANUAL RESULT ENTRY): POC Glucose: 213 mg/dl — AB (ref 70–99)

## 2020-03-23 MED ORDER — METFORMIN HCL 1000 MG PO TABS: 1000.0000 mg | ORAL_TABLET | Freq: Two times a day (BID) | ORAL | 1 refills | Status: DC

## 2020-03-23 MED ORDER — ESCITALOPRAM OXALATE 20 MG PO TABS: 20.0000 mg | ORAL_TABLET | Freq: Every day | ORAL | 1 refills | Status: DC

## 2020-03-23 MED ORDER — ALBUTEROL SULFATE HFA 108 (90 BASE) MCG/ACT IN AERS: 2.0000 | INHALATION_SPRAY | Freq: Four times a day (QID) | RESPIRATORY_TRACT | 1 refills | Status: DC | PRN

## 2020-03-23 MED ORDER — TRULICITY 0.75 MG/0.5ML ~~LOC~~ SOAJ: 0.7500 mg | SUBCUTANEOUS | 6 refills | Status: DC

## 2020-03-23 MED FILL — ESCITALOPRAM 20 MG TABLET: 20 | 90 days supply | Qty: 90 | Fill #0

## 2020-03-23 MED FILL — PROAIR HFA 90 MCG INHALER: 108 (90 BAS | 25 days supply | Qty: 9 | Fill #0

## 2020-03-23 NOTE — Progress Notes (Signed)
Established Patient Office Visit  Subjective:  Patient ID: [name], adult    DOB: October 02, 1970  Age: 49 y.o. MRN: [id number]  CC:  Chief Complaint  Patient presents with  . Diabetes    HPI [name] "[name]" identifies as a male is 49 years old presents for management Type 2 Diabetes.  Denies polyuria polyphagia and polydipsia.  Past Medical History:  Diagnosis Date  . Asthma   . Depression   . Diabetes mellitus   . GERD (gastroesophageal reflux disease)   . HIV disease ([place])   . Hypertension   . 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 Perirectal abscess 03/30/2017  . Pneumonia   . Polyuria 04/27/2015  . Poorly controlled diabetes mellitus ([place]) 04/27/2015  . Testicular mass 06/20/2016  . Transgender 04/27/2015    Past Surgical History:  Procedure Laterality Date  . APPENDECTOMY    . TRANSFORAMINAL LUMBAR INTERBODY FUSION (TLIF) WITH PEDICLE SCREW FIXATION 3 LEVEL N/A 01/09/2019   Procedure: TRANSFORAMINAL LUMBAR INTERBODY FUSION (TLIF) WITH PEDICLE SCREW FIXATION 3 LEVEL, POSTERIOR SPINAL FUSION;  Surgeon: [name], MD;  Location: [facility] ORS;  Service: Neurosurgery;  Laterality: N/A;    Family History  Problem Relation Age of Onset  . Heart failure Father   . Diabetes Father     Social History   Socioeconomic History  . Marital status: Single    Spouse name: Not on file  . Number of children: Not on file  . Years of education: Not on file  . Highest education level: Not on file  Occupational History  . Not on file  Tobacco Use  . Smoking status: Former Smoker    Packs/day: 0.10    Years: 27.00    Pack years: 2.70    Types: Cigarettes    Quit date: 07/19/2018    Years since quitting: 1.6  . Smokeless tobacco: Never Used  . Tobacco comment: started back yesterday  Vaping Use  . Vaping Use: Never used  Substance and Sexual Activity  . Alcohol use: No    Alcohol/week: 0.0 standard drinks  . Drug use: Yes    Frequency: 7.0 times per week     Types: Marijuana  . Sexual activity: Yes    Partners: Male    Comment: pt. given condoms  Other Topics Concern  . Not on file  Social History Narrative  . Not on file   Social Determinants of Health   Financial Resource Strain:   . Difficulty of Paying Living Expenses: Not on file  Food Insecurity:   . Worried About Charity fundraiser in the Last Year: Not on file  . Ran Out of Food in the Last Year: Not on file  Transportation Needs:   . Lack of Transportation (Medical): Not on file  . Lack of Transportation (Non-Medical): Not on file  Physical Activity:   . Days of Exercise per Week: Not on file  . Minutes of Exercise per Session: Not on file  Stress:   . Feeling of Stress : Not on file  Social Connections:   . Frequency of Communication with Friends and Family: Not on file  . Frequency of Social Gatherings with Friends and Family: Not on file  . Attends Religious Services: Not on file  . Active Member of Clubs or Organizations: Not on file  . Attends Archivist Meetings: Not on file  . Marital Status: Not on file  Intimate Partner Violence:   . Fear of  Current or Ex-Partner: Not on file  . Emotionally Abused: Not on file  . Physically Abused: Not on file  . Sexually Abused: Not on file    Outpatient Medications Prior to Visit  Medication Sig Dispense Refill  . ACCU-CHEK SOFTCLIX LANCETS lancets 1 each by Other route 3 (three) times daily. 100 each 5  . bictegravir-emtricitabine-tenofovir AF (BIKTARVY) 50-200-25 MG TABS tablet Take 1 tablet by mouth daily. 30 tablet 11  . blood glucose meter kit and supplies Dispense based on patient and insurance preference. Use up to four times daily as directed. (FOR ICD-10 E10.9, E11.9). 1 each 0  . estradiol (ESTRACE) 2 MG tablet Take 1 tablet (2 mg total) by mouth daily. 30 tablet 11  . gabapentin (NEURONTIN) 300 MG capsule Take by mouth.    . spironolactone (ALDACTONE) 100 MG tablet Take 1 tablet (100 mg total) by  mouth daily. 30 tablet 11  . albuterol (VENTOLIN HFA) 108 (90 Base) MCG/ACT inhaler Inhale 2 puffs into the lungs every 6 (six) hours as needed for wheezing or shortness of breath. 6.7 g 1  . escitalopram (LEXAPRO) 20 MG tablet Take 1 tablet (20 mg total) by mouth at bedtime. 30 tablet 3  . metFORMIN (GLUCOPHAGE) 1000 MG tablet TAKE 1 TABLET BY MOUTH TWICE DAILY WITH MEALS 60 tablet 0  . celecoxib (CELEBREX) 200 MG capsule Take 1 capsule (200 mg total) by mouth 2 (two) times daily. (Patient not taking: Reported on 05/08/2019) 60 capsule 0  . glimepiride (AMARYL) 4 MG tablet Take 1 tablet (4 mg total) by mouth daily before breakfast. (Patient not taking: Reported on 08/27/2019) 90 tablet 0  . Melatonin 5 MG CAPS Take 1 capsule (5 mg total) by mouth at bedtime as needed. (Patient not taking: Reported on 08/27/2019) 30 capsule 3  . methocarbamol (ROBAXIN) 500 MG tablet Take 1-2 tablets (500-1,000 mg total) by mouth 4 (four) times daily. 90 tablet 0   No facility-administered medications prior to visit.    Allergies  Allergen Reactions  . Propoxyphene N-Acetaminophen Other (See Comments)    Stomach cramps    ROS Review of Systems  All other systems reviewed and are negative.     Objective:    Physical Exam Vitals reviewed.  HENT:     Head: Normocephalic.     Right Ear: Tympanic membrane normal.     Left Ear: Tympanic membrane normal.  Eyes:     Extraocular Movements: Extraocular movements intact.     Pupils: Pupils are equal, round, and reactive to light.  Cardiovascular:     Rate and Rhythm: Normal rate and regular rhythm.  Pulmonary:     Effort: Pulmonary effort is normal.     Breath sounds: Wheezing and rhonchi present.  Abdominal:     General: Bowel sounds are normal. There is distension.     Palpations: Abdomen is soft.  Skin:    General: Skin is warm and dry.  Neurological:     Mental Status: She is alert and oriented to person, place, and time.  Psychiatric:         Mood and Affect: Mood normal.        Behavior: Behavior normal.        Thought Content: Thought content normal.        Judgment: Judgment normal.     BP 108/76 (BP Location: Left Arm, Patient Position: Sitting, Cuff Size: Large)   Pulse 95   Temp (!) 97.5 F (36.4 C) (Temporal)   Ht  '5\' 11"'  (1.803 m)   Wt 235 lb 6.4 oz (106.8 kg)   SpO2 96%   BMI 32.83 kg/m  Wt Readings from Last 3 Encounters:  03/23/20 235 lb 6.4 oz (106.8 kg)  03/16/20 236 lb 4 oz (107.2 kg)  12/13/19 246 lb 8 oz (111.8 kg)     Health Maintenance Due  Topic Date Due  . URINE MICROALBUMIN  04/25/2020    There are no preventive care reminders to display for this patient.  Lab Results  Component Value Date   TSH 1.090 12/06/2017   Lab Results  Component Value Date   WBC 11.6 (H) 11/13/2019   HGB 16.6 11/13/2019   HCT 48.8 11/13/2019   MCV 96.3 11/13/2019   PLT 337 11/13/2019   Lab Results  Component Value Date   NA 138 09/18/2019   K 4.3 09/18/2019   CO2 26 09/18/2019   GLUCOSE 180 (H) 09/18/2019   BUN 10 09/18/2019   CREATININE 0.76 09/18/2019   BILITOT 0.4 11/13/2019   ALKPHOS 97 05/23/2018   AST 24 11/13/2019   ALT 36 11/13/2019   PROT 6.8 11/13/2019   ALBUMIN 4.0 05/23/2018   CALCIUM 9.3 09/18/2019   ANIONGAP 10 11/23/2018   Lab Results  Component Value Date   CHOL 150 07/12/2019   Lab Results  Component Value Date   HDL 37 (L) 07/12/2019   Lab Results  Component Value Date   LDLCALC 80 07/12/2019   Lab Results  Component Value Date   TRIG 241 (H) 07/12/2019   Lab Results  Component Value Date   CHOLHDL 4.1 07/12/2019   Lab Results  Component Value Date   HGBA1C 9.8 (A) 03/23/2020      Assessment & Plan:  . Bravery was seen today for diabetes.  Diagnoses and all orders for this visit:  Type 2 diabetes mellitus without complication, with long-term current use of insulin (HCC) A1c increased from 8.8 (6) months ago to 9.8.  Change in medication continue  Metformin at 1000 mg twice daily added Trulicity 0.5 mg once weekly will see clinical pharmacist for instructions and teaching of insulin injection -     HgB A1c 9.8  -     Glucose (CBG) -     Discontinue: Dulaglutide (TRULICITY) 1.27 NT/7.0YF SOPN; Inject 0.75 mg into the skin once a week. -     Dulaglutide (TRULICITY) 7.49 SW/9.6PR SOPN; Inject 0.75 mg into the skin once a week.  Tobacco use disorder Well aware of Nicotine affects every organ in the body second leading cause of death.  Increased risk for lung cancer and other respiratory diseases recommend cessation.  This will be reminded at each clinical visit.  Moderate single current episode of major depressive disorder (HCC) No side effects will continue -     escitalopram (LEXAPRO) 20 MG tablet; Take 1 tablet (20 mg total) by mouth at bedtime.  Mild intermittent asthma without complication Refill albuterol also discussed cigarette smoking is contraindicated with asthma and can cause exacerbation.  Will monitor the use of albuterol and determine if a LABA is needed  Need for immunization against influenza CDC recommends influenza vaccine yearly.   -     Flu Vaccine QUAD 36+ mos IM  Other orders -     metFORMIN (GLUCOPHAGE) 1000 MG tablet; Take 1 tablet (1,000 mg total) by mouth 2 (two) times daily with a meal. -     albuterol (VENTOLIN HFA) 108 (90 Base) MCG/ACT inhaler; Inhale 2 puffs into the  lungs every 6 (six) hours as needed for wheezing or shortness of breath.   Follow-up: Return in about 3 months (around 06/23/2020) for DM.    [name], NP

## 2020-03-23 NOTE — Patient Instructions (Addendum)
A1C has increased added Trulicity 0.5 mL weekly schedule appointment with clinical pharmacist [facility] for directions and pick up medication at [address] noted on AVS  Diabetes Mellitus and Nutrition, Adult When you have diabetes (diabetes mellitus), it is very important to have healthy eating habits because your blood sugar (glucose) levels are greatly affected by what you eat and drink. Eating healthy foods in the appropriate amounts, at about the same times every day, can help you:  Control your blood glucose.  Lower your risk of heart disease.  Improve your blood pressure.  Reach or maintain a healthy weight. Every person with diabetes is different, and each person has different needs for a meal plan. Your health care provider may recommend that you work with a diet and nutrition specialist (dietitian) to make a meal plan that is best for you. Your meal plan may vary depending on factors such as:  The calories you need.  The medicines you take.  Your weight.  Your blood glucose, blood pressure, and cholesterol levels.  Your activity level.  Other health conditions you have, such as heart or kidney disease. How do carbohydrates affect me? Carbohydrates, also called carbs, affect your blood glucose level more than any other type of food. Eating carbs naturally raises the amount of glucose in your blood. Carb counting is a method for keeping track of how many carbs you eat. Counting carbs is important to keep your blood glucose at a healthy level, especially if you use insulin or take certain oral diabetes medicines. It is important to know how many carbs you can safely have in each meal. This is different for every person. Your dietitian can help you calculate how many carbs you should have at each meal and for each snack. Foods that contain carbs include:  Bread, cereal, rice, pasta, and crackers.  Potatoes and corn.  Peas, beans, and lentils.  Milk and yogurt.  Fruit and  juice.  Desserts, such as cakes, cookies, ice cream, and candy. How does alcohol affect me? Alcohol can cause a sudden decrease in blood glucose (hypoglycemia), especially if you use insulin or take certain oral diabetes medicines. Hypoglycemia can be a life-threatening condition. Symptoms of hypoglycemia (sleepiness, dizziness, and confusion) are similar to symptoms of having too much alcohol. If your health care provider says that alcohol is safe for you, follow these guidelines:  Limit alcohol intake to no more than 1 drink per day for nonpregnant women and 2 drinks per day for men. One drink equals 12 oz of beer, 5 oz of wine, or 1 oz of hard liquor.  Do not drink on an empty stomach.  Keep yourself hydrated with water, diet soda, or unsweetened iced tea.  Keep in mind that regular soda, juice, and other mixers may contain a lot of sugar and must be counted as carbs. What are tips for following this plan?  Reading food labels  Start by checking the serving size on the "Nutrition Facts" label of packaged foods and drinks. The amount of calories, carbs, fats, and other nutrients listed on the label is based on one serving of the item. Many items contain more than one serving per package.  Check the total grams (g) of carbs in one serving. You can calculate the number of servings of carbs in one serving by dividing the total carbs by 15. For example, if a food has 30 g of total carbs, it would be equal to 2 servings of carbs.  Check the  number of grams (g) of saturated and trans fats in one serving. Choose foods that have low or no amount of these fats.  Check the number of milligrams (mg) of salt (sodium) in one serving. Most people should limit total sodium intake to less than 2,300 mg per day.  Always check the nutrition information of foods labeled as "low-fat" or "nonfat". These foods may be higher in added sugar or refined carbs and should be avoided.  Talk to your dietitian to  identify your daily goals for nutrients listed on the label. Shopping  Avoid buying canned, premade, or processed foods. These foods tend to be high in fat, sodium, and added sugar.  Shop around the outside edge of the grocery store. This includes fresh fruits and vegetables, bulk grains, fresh meats, and fresh dairy. Cooking  Use low-heat cooking methods, such as baking, instead of high-heat cooking methods like deep frying.  Cook using healthy oils, such as olive, canola, or sunflower oil.  Avoid cooking with butter, cream, or high-fat meats. Meal planning  Eat meals and snacks regularly, preferably at the same times every day. Avoid going long periods of time without eating.  Eat foods high in fiber, such as fresh fruits, vegetables, beans, and whole grains. Talk to your dietitian about how many servings of carbs you can eat at each meal.  Eat 4-6 ounces (oz) of lean protein each day, such as lean meat, chicken, fish, eggs, or tofu. One oz of lean protein is equal to: ? 1 oz of meat, chicken, or fish. ? 1 egg. ?  cup of tofu.  Eat some foods each day that contain healthy fats, such as avocado, nuts, seeds, and fish. Lifestyle  Check your blood glucose regularly.  Exercise regularly as told by your health care provider. This may include: ? 150 minutes of moderate-intensity or vigorous-intensity exercise each week. This could be brisk walking, biking, or water aerobics. ? Stretching and doing strength exercises, such as yoga or weightlifting, at least 2 times a week.  Take medicines as told by your health care provider.  Do not use any products that contain nicotine or tobacco, such as cigarettes and e-cigarettes. If you need help quitting, ask your health care provider.  Work with a Social worker or diabetes educator to identify strategies to manage stress and any emotional and social challenges. Questions to ask a health care provider  Do I need to meet with a diabetes  educator?  Do I need to meet with a dietitian?  What number can I call if I have questions?  When are the best times to check my blood glucose? Where to find more information:  American Diabetes Association: diabetes.org  Academy of Nutrition and Dietetics: www.eatright.[employer] of Diabetes and Digestive and Kidney Diseases (NIH): [employer] Summary  A healthy meal plan will help you control your blood glucose and maintain a healthy lifestyle.  Working with a diet and nutrition specialist (dietitian) can help you make a meal plan that is best for you.  Keep in mind that carbohydrates (carbs) and alcohol have immediate effects on your blood glucose levels. It is important to count carbs and to use alcohol carefully. This information is not intended to replace advice given to you by your health care provider. Make sure you discuss any questions you have with your health care provider. Document Revised: 05/12/2017 Document Reviewed: 07/04/2016 Elsevier Patient Education  2020 [employer].

## 2020-03-24 LAB — LIPID PANEL
Chol/HDL Ratio: 4.5 ratio (ref 0.0–5.0)
Cholesterol, Total: 134 mg/dL (ref 100–199)
HDL: 30 mg/dL — ABNORMAL LOW (ref >39)
LDL Chol Calc (NIH): 83 mg/dL (ref 0–99)
Triglycerides: 114 mg/dL (ref 0–149)
VLDL Cholesterol Cal: 21 mg/dL (ref 5–40)

## 2020-03-30 ENCOUNTER — Other Ambulatory Visit: Payer: Self-pay

## 2020-03-30 ENCOUNTER — Encounter: Payer: Self-pay | Admitting: Pharmacist

## 2020-03-30 ENCOUNTER — Ambulatory Visit: Payer: Medicaid Other | Attending: Primary Care | Admitting: Pharmacist

## 2020-03-30 DIAGNOSIS — E119 Type 2 diabetes mellitus without complications: Secondary | ICD-10-CM

## 2020-03-30 DIAGNOSIS — Z794 Long term (current) use of insulin: Secondary | ICD-10-CM | POA: Diagnosis not present

## 2020-03-30 MED FILL — TRULICITY 0.75 MG/0.5 ML PE: 0.75 | 28 days supply | Qty: 2 | Fill #0

## 2020-03-30 NOTE — Progress Notes (Signed)
Patient was educated on the use of the Trulicity pen. Reviewed necessary supplies and operation of the pen. Patient was able to demonstrate use. All questions and concerns were addressed.  Butch Penny, PharmD, CPP Clinical Pharmacist [facility] [phone]

## 2020-04-02 ENCOUNTER — Other Ambulatory Visit (HOSPITAL_COMMUNITY): Payer: Self-pay | Admitting: Neurosurgery

## 2020-04-02 DIAGNOSIS — Z981 Arthrodesis status: Secondary | ICD-10-CM | POA: Diagnosis not present

## 2020-04-02 DIAGNOSIS — M419 Scoliosis, unspecified: Secondary | ICD-10-CM | POA: Diagnosis not present

## 2020-04-02 DIAGNOSIS — M5442 Lumbago with sciatica, left side: Secondary | ICD-10-CM | POA: Diagnosis not present

## 2020-04-02 DIAGNOSIS — M5441 Lumbago with sciatica, right side: Secondary | ICD-10-CM | POA: Diagnosis not present

## 2020-04-02 DIAGNOSIS — M47816 Spondylosis without myelopathy or radiculopathy, lumbar region: Secondary | ICD-10-CM | POA: Diagnosis not present

## 2020-04-02 DIAGNOSIS — G8929 Other chronic pain: Secondary | ICD-10-CM | POA: Diagnosis not present

## 2020-04-02 DIAGNOSIS — M4326 Fusion of spine, lumbar region: Secondary | ICD-10-CM | POA: Diagnosis not present

## 2020-04-02 MED FILL — CELECOXIB 100 MG CAPS: 100 | 30 days supply | Qty: 60 | Fill #0

## 2020-04-02 MED FILL — METHOCARBAMOL 500 MG TABS: 500 | 30 days supply | Qty: 120 | Fill #0

## 2020-04-13 ENCOUNTER — Encounter: Payer: Self-pay | Admitting: Infectious Disease

## 2020-04-13 ENCOUNTER — Encounter (INDEPENDENT_AMBULATORY_CARE_PROVIDER_SITE_OTHER): Payer: Self-pay | Admitting: *Deleted

## 2020-04-13 ENCOUNTER — Other Ambulatory Visit: Payer: Self-pay

## 2020-04-13 VITALS — BP 126/86 | HR 114 | Temp 98.4°F | Wt 234.5 lb

## 2020-04-13 DIAGNOSIS — Z419 Encounter for procedure for purposes other than remedying health state, unspecified: Secondary | ICD-10-CM | POA: Diagnosis not present

## 2020-04-13 DIAGNOSIS — Z006 Encounter for examination for normal comparison and control in clinical research program: Secondary | ICD-10-CM
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
## 2020-04-14 LAB — LIPID PANEL
Cholesterol: 134 mg/dL (ref <200)
HDL: 25 mg/dL — ABNORMAL LOW (ref >=40)
LDL Cholesterol (Calc): 71 mg/dL (calc)
Non-HDL Cholesterol (Calc): 109 mg/dL (calc) (ref <130)
Total CHOL/HDL Ratio: 5.4 (calc) — ABNORMAL HIGH (ref <5.0)
Triglycerides: 313 mg/dL — ABNORMAL HIGH (ref <150)

## 2020-04-14 LAB — COMPREHENSIVE METABOLIC PANEL
AG Ratio: 1.6 (calc) (ref 1.0–2.5)
ALT: 36 U/L (ref 9–46)
AST: 23 U/L (ref 10–40)
Albumin: 4.4 g/dL (ref 3.6–5.1)
Alkaline phosphatase (APISO): 109 U/L (ref 36–130)
BUN: 14 mg/dL (ref 7–25)
CO2: 28 mmol/L (ref 20–32)
Calcium: 9.3 mg/dL (ref 8.6–10.3)
Chloride: 95 mmol/L — ABNORMAL LOW (ref 98–110)
Creat: 0.82 mg/dL (ref 0.60–1.35)
Globulin: 2.7 g/dL (calc) (ref 1.9–3.7)
Glucose, Bld: 297 mg/dL — ABNORMAL HIGH (ref 65–99)
Potassium: 4.1 mmol/L (ref 3.5–5.3)
Sodium: 133 mmol/L — ABNORMAL LOW (ref 135–146)
Total Bilirubin: 0.7 mg/dL (ref 0.2–1.2)
Total Protein: 7.1 g/dL (ref 6.1–8.1)

## 2020-04-14 LAB — BILIRUBIN, DIRECT: Bilirubin, Direct: 0.1 mg/dL (ref 0.0–0.2)

## 2020-04-14 LAB — PHOSPHORUS: Phosphorus: 3.3 mg/dL (ref 2.5–4.5)

## 2020-04-14 LAB — HEPATITIS B SURFACE ANTIBODY,QUALITATIVE: Hep B S Ab: REACTIVE — AB

## 2020-04-14 LAB — HEPATITIS B SURFACE ANTIGEN: Hepatitis B Surface Ag: NONREACTIVE

## 2020-04-14 LAB — HEPATITIS C ANTIBODY
Hepatitis C Ab: NONREACTIVE
SIGNAL TO CUT-OFF: 0.03 (ref <1.00)

## 2020-04-14 LAB — PROTEIN, URINE, RANDOM: Total Protein, Urine: 34 mg/dL — ABNORMAL HIGH (ref 5–25)

## 2020-04-14 LAB — CREATININE, URINE, RANDOM: Creatinine, Urine: 87 mg/dL (ref 20–320)

## 2020-04-14 LAB — CK: Total CK: 99 U/L (ref 44–196)

## 2020-04-15 ENCOUNTER — Encounter: Payer: Self-pay | Admitting: *Deleted

## 2020-04-15 LAB — CD4/CD8 (T-HELPER/T-SUPPRESSOR CELL)
CD4 Count: 969
CD4%: 57
CD8 % Suppressor T Cell: 25.3
CD8: 430

## 2020-04-15 MED FILL — BIKTARVY 50-200-25 MG TABS: 50-200-25 | 30 days supply | Qty: 30 | Fill #6

## 2020-04-20 LAB — HIV-1 RNA QUANT-NO REFLEX-BLD
Log copies/ml (ULTRA): 107
Log copies/ml (ULTRA): 2.03

## 2020-05-11 ENCOUNTER — Encounter: Payer: Medicaid Other | Admitting: *Deleted

## 2020-05-11 ENCOUNTER — Ambulatory Visit: Payer: Medicaid Other | Admitting: Infectious Disease

## 2020-05-11 MED FILL — BIKTARVY 50-200-25 MG TABS: 50-200-25 | 30 days supply | Qty: 30 | Fill #7

## 2020-05-13 DIAGNOSIS — Z419 Encounter for procedure for purposes other than remedying health state, unspecified: Secondary | ICD-10-CM | POA: Diagnosis not present

## 2020-05-27 MED FILL — TRULICITY 0.75 MG/0.5 ML PE: 0.75 | 28 days supply | Qty: 2 | Fill #1

## 2020-06-01 IMAGING — RF LUMBAR SPINE - 2-3 VIEW
1 series · 15 of 21 positions shown · non-contrast
Comparison: 04/29/2018

CLINICAL DATA: Discectomy and fusion procedure.

EXAM:
LUMBAR SPINE - 2-3 VIEW; DG C-ARM 61-120 MIN

[Series 1: unknown protocol · 0.14mm/px · 15 of 21 slices shown]
[im 1/21]
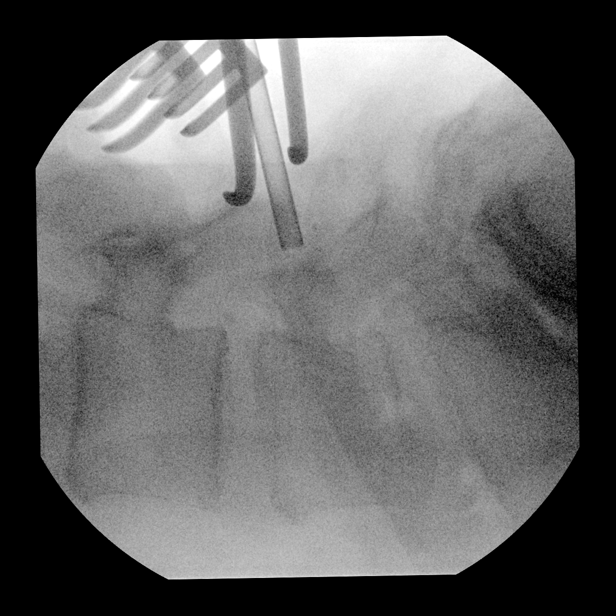
[im 3/21]
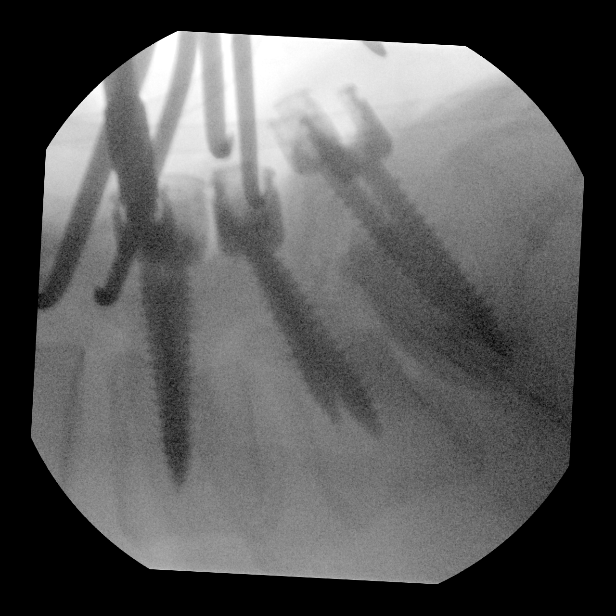
[im 4/21]
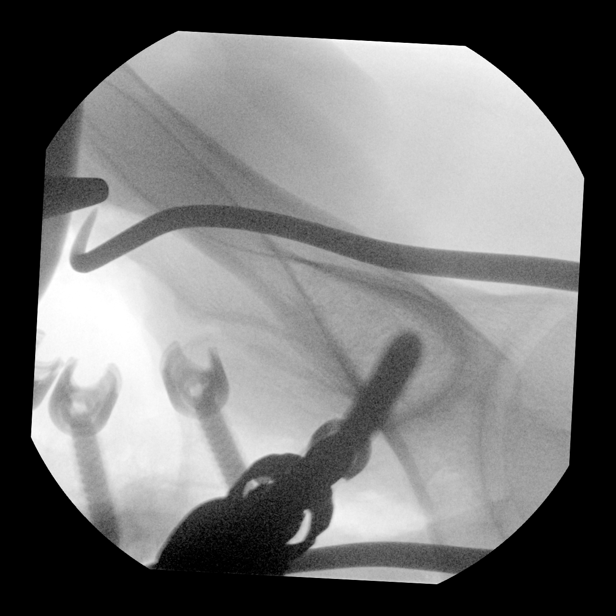
[im 5/21]
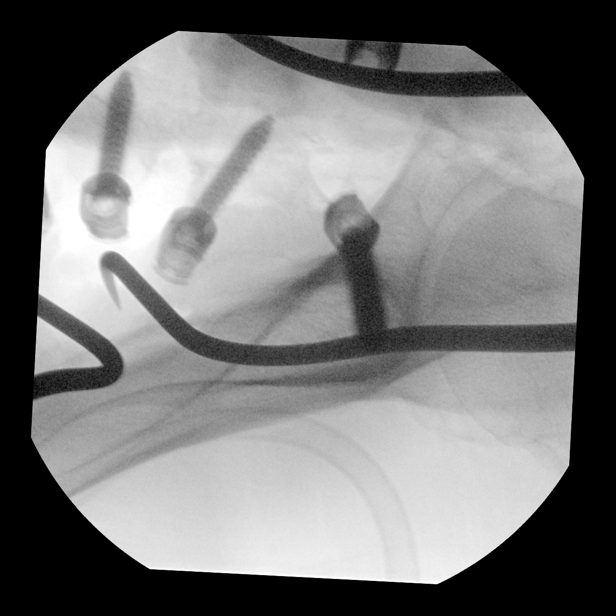
[im 7/21]
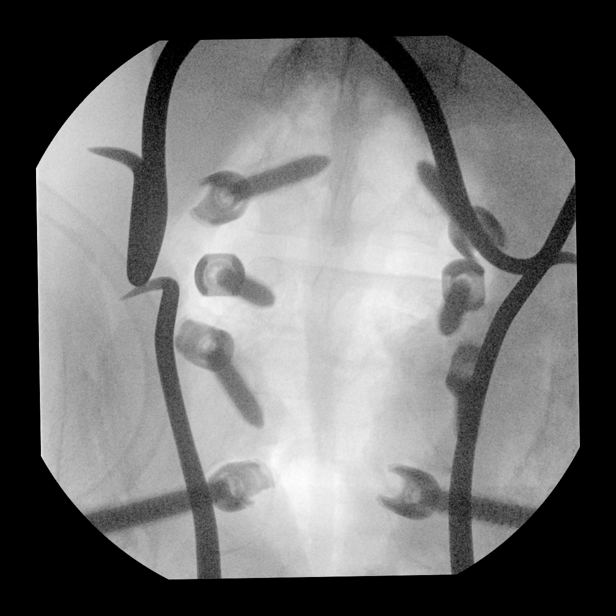
[im 8/21]
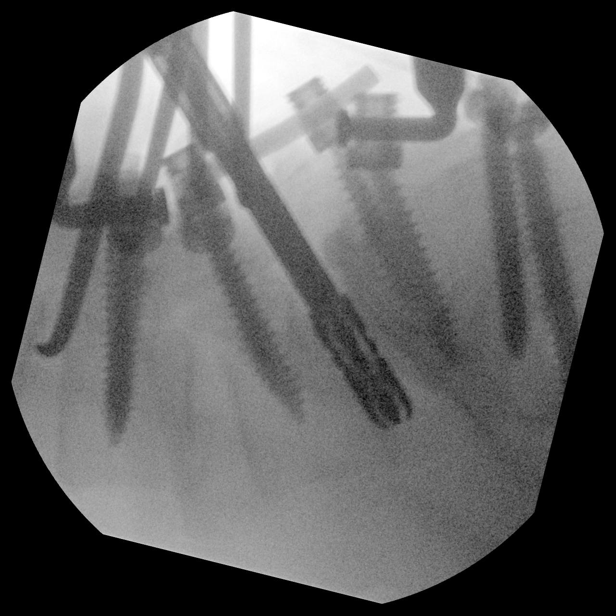
[im 10/21]
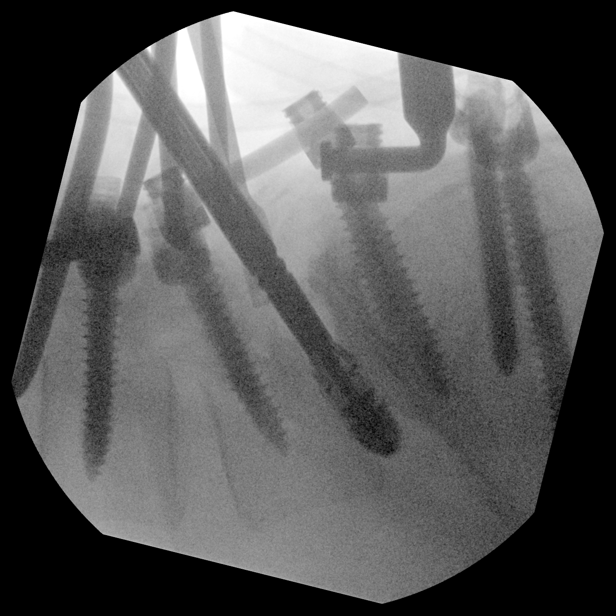
[im 11/21]
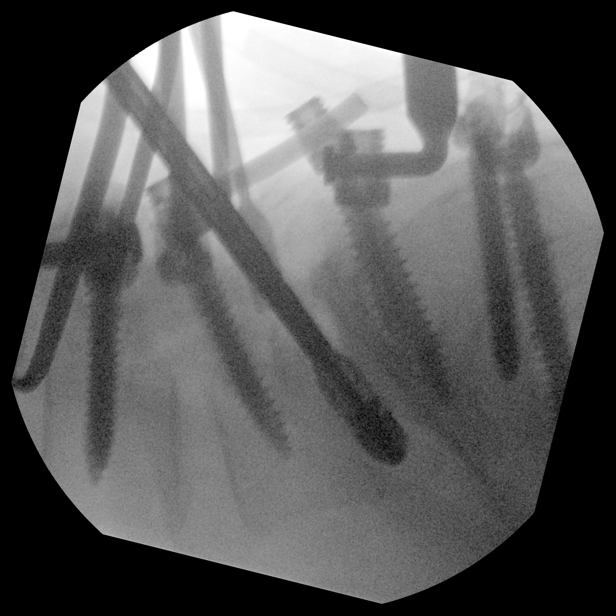
[im 12/21]
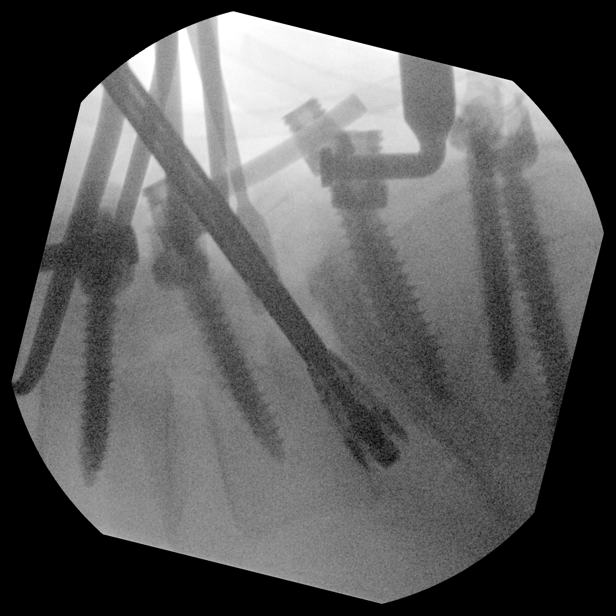
[im 14/21]
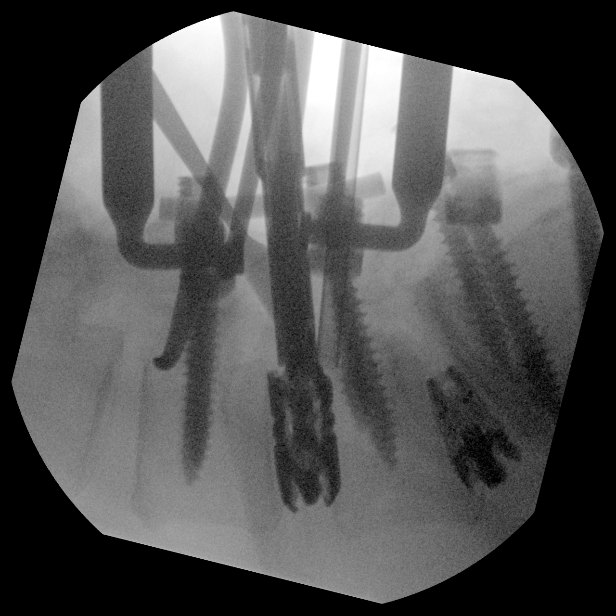
[im 15/21]
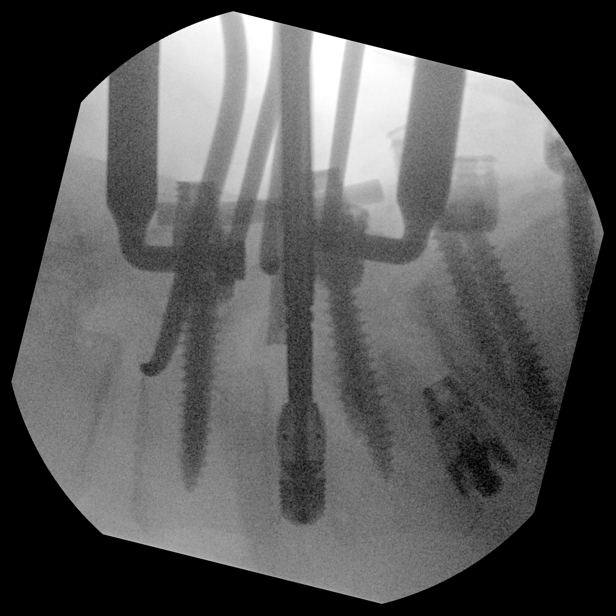
[im 17/21]
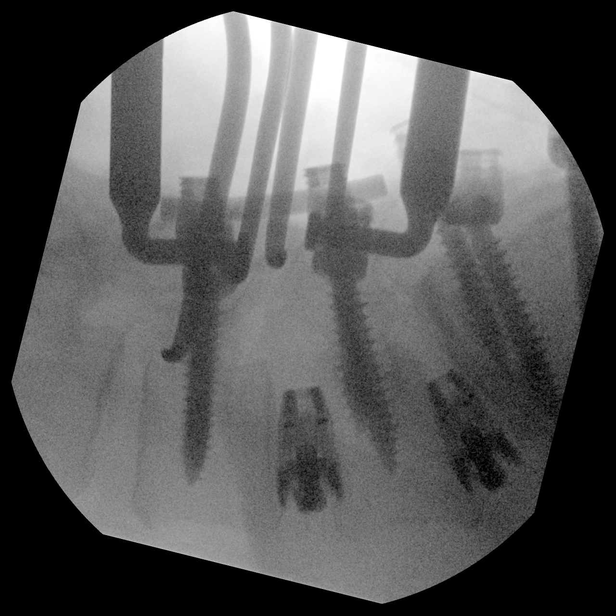
[im 18/21]
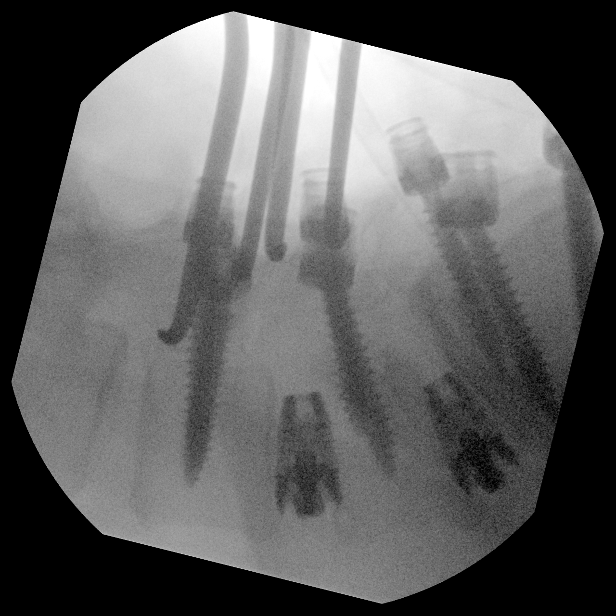
[im 19/21]
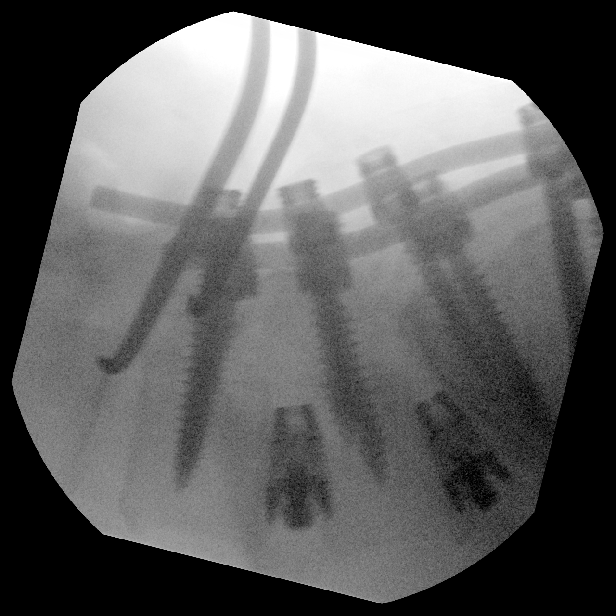
[im 21/21]
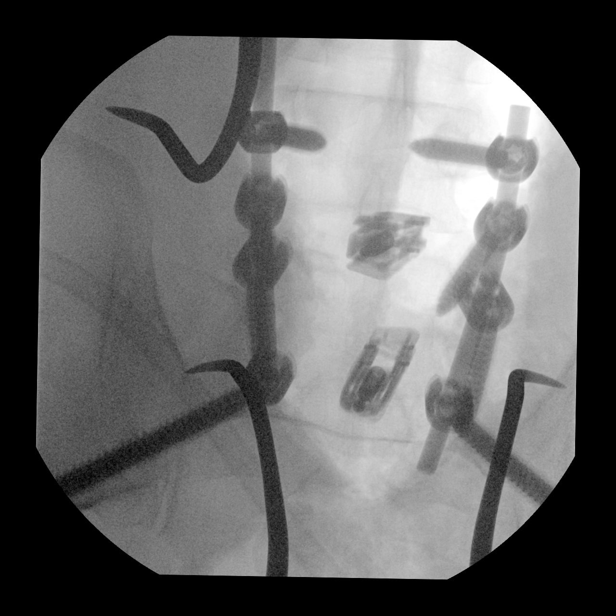

[15 of 21 positions shown; findings below may reference images not displayed]

FINDINGS: Multiple C-arm images show discectomy and fusion from L4 to the
sacrum with placement of screws and posterior rods. No
radiographically detectable complication on these operative images.
IMPRESSION: L4 to sacrum discectomy and fusion procedure with bilateral
sacroiliac screws.

## 2020-06-13 DIAGNOSIS — Z419 Encounter for procedure for purposes other than remedying health state, unspecified: Secondary | ICD-10-CM | POA: Diagnosis not present

## 2020-06-17 MED FILL — BIKTARVY 50-200-25 MG TABS: 50-200-25 | 30 days supply | Qty: 30 | Fill #8

## 2020-06-17 MED FILL — ESCITALOPRAM 20 MG TABLET: 20 | 90 days supply | Qty: 90 | Fill #1

## 2020-06-23 ENCOUNTER — Ambulatory Visit (INDEPENDENT_AMBULATORY_CARE_PROVIDER_SITE_OTHER): Payer: Medicaid Other | Admitting: Primary Care

## 2020-06-26 MED FILL — TRULICITY 0.75 MG/0.5 ML PE: 0.75 | 28 days supply | Qty: 2 | Fill #2

## 2020-07-08 ENCOUNTER — Other Ambulatory Visit (INDEPENDENT_AMBULATORY_CARE_PROVIDER_SITE_OTHER): Payer: Self-pay | Admitting: Primary Care

## 2020-07-08 DIAGNOSIS — E119 Type 2 diabetes mellitus without complications: Secondary | ICD-10-CM

## 2020-07-14 DIAGNOSIS — Z419 Encounter for procedure for purposes other than remedying health state, unspecified: Secondary | ICD-10-CM | POA: Diagnosis not present

## 2020-07-16 ENCOUNTER — Other Ambulatory Visit (HOSPITAL_COMMUNITY): Payer: Self-pay | Admitting: Neurosurgery

## 2020-07-16 DIAGNOSIS — M5442 Lumbago with sciatica, left side: Secondary | ICD-10-CM | POA: Diagnosis not present

## 2020-07-16 DIAGNOSIS — M47816 Spondylosis without myelopathy or radiculopathy, lumbar region: Secondary | ICD-10-CM | POA: Diagnosis not present

## 2020-07-16 DIAGNOSIS — M4326 Fusion of spine, lumbar region: Secondary | ICD-10-CM | POA: Diagnosis not present

## 2020-07-16 DIAGNOSIS — G8929 Other chronic pain: Secondary | ICD-10-CM | POA: Diagnosis not present

## 2020-07-16 DIAGNOSIS — M4319 Spondylolisthesis, multiple sites in spine: Secondary | ICD-10-CM | POA: Diagnosis not present

## 2020-07-16 DIAGNOSIS — M5441 Lumbago with sciatica, right side: Secondary | ICD-10-CM | POA: Diagnosis not present

## 2020-07-16 DIAGNOSIS — Z981 Arthrodesis status: Secondary | ICD-10-CM | POA: Diagnosis not present

## 2020-07-16 MED FILL — tiZANidine HCL 2 MG TABS: 2 | 30 days supply | Qty: 90 | Fill #0

## 2020-07-17 ENCOUNTER — Other Ambulatory Visit: Payer: Self-pay | Admitting: *Deleted

## 2020-07-17 DIAGNOSIS — M62830 Muscle spasm of back: Secondary | ICD-10-CM | POA: Diagnosis not present

## 2020-07-17 DIAGNOSIS — Z5941 Food insecurity: Secondary | ICD-10-CM

## 2020-07-17 DIAGNOSIS — M533 Sacrococcygeal disorders, not elsewhere classified: Secondary | ICD-10-CM | POA: Diagnosis not present

## 2020-07-17 DIAGNOSIS — Z981 Arthrodesis status: Secondary | ICD-10-CM | POA: Diagnosis not present
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
## 2020-07-21 ENCOUNTER — Other Ambulatory Visit (HOSPITAL_COMMUNITY): Payer: Self-pay | Admitting: Neurosurgery

## 2020-07-21 MED FILL — CELECOXIB 100 MG CAPS: 100 | 30 days supply | Qty: 60 | Fill #0

## 2020-07-22 ENCOUNTER — Other Ambulatory Visit: Payer: Self-pay

## 2020-07-23 MED FILL — BIKTARVY 50-200-25 MG TABS: 50-200-25 | 30 days supply | Qty: 30 | Fill #9

## 2020-07-24 ENCOUNTER — Other Ambulatory Visit: Payer: Self-pay

## 2020-07-24 NOTE — Patient Outreach (Signed)
Care Coordination  07/24/2020  BERNHARD KOSKINEN 1971/03/03 [id number]   Medicaid Managed Care   Unsuccessful Outreach Note  07/24/2020 Name: [name] MRN: [id number] DOB: 1970/07/01  Referred by: Grayce Sessions, NP Reason for referral : High Risk Managed Medicaid (HR MM Unsuccessful Telephone Outreach)   An unsuccessful telephone outreach was attempted today. The patient was referred to the case management team for assistance with care management and care coordination.   Follow Up Plan: The care management team will reach out to the patient again over the next 7 days.   SIGNATURE

## 2020-07-24 NOTE — Patient Instructions (Signed)
Visit Information  Ms. Marguerite Olea  - as a part of your Medicaid benefit, you are eligible for care management and care coordination services at no cost or copay. I was unable to reach you by phone today but would be happy to help you with your health related needs. Please feel free to call me @ [phone].   A member of the Managed Medicaid care management team will reach out to you again over the next 7 days.   Gus Puma, BSW, [facility]  JAARS  High Risk Managed Medicaid Team

## 2020-07-29 ENCOUNTER — Other Ambulatory Visit: Payer: Self-pay

## 2020-08-04 ENCOUNTER — Other Ambulatory Visit: Payer: Self-pay
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
## 2020-08-05 ENCOUNTER — Other Ambulatory Visit: Payer: Self-pay
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
## 2020-08-05 NOTE — Patient Outreach (Signed)
Medicaid Managed Care    Pharmacy Note  08/05/2020 Name: [name] MRN: [id number] DOB: [birth year]  [name] is a 50 y.o. year old adult who is a primary care patient of [name] Perna, NP. [facility] Managed Care Coordination team was consulted for assistance with disease management and care coordination needs.    Engaged with patient Engaged with patient by telephone for initial visit in response to referral for case management and/or care coordination services.  Ms. Scearce was given information about Managed Medicaid Care Coordination team services today. [name] agreed to services and verbal consent obtained.   Objective:  Lab Results  Component Value Date   CREATININE 0.82 04/13/2020   CREATININE 0.76 09/18/2019   CREATININE 0.92 08/27/2019    Lab Results  Component Value Date   HGBA1C 9.8 (A) 03/23/2020       Component Value Date/Time   CHOL 134 04/13/2020 0910   CHOL 134 03/23/2020 1422   TRIG 313 (H) 04/13/2020 0910   HDL 25 (L) 04/13/2020 0910   HDL 30 (L) 03/23/2020 1422   CHOLHDL 5.4 (H) 04/13/2020 0910   VLDL 47 (H) 11/01/2017 0334   LDLCALC 71 04/13/2020 0910    Other: (TSH, CBC, Vit D, etc.)  Clinical ASCVD: Yes  The ASCVD Risk score [name], et al., 2013) failed to calculate for the following reasons:   The patient has a prior MI or stroke diagnosis    Other: (CHADS2VASc if Afib, PHQ9 if depression, MMRC or CAT for COPD, ACT, DEXA)  BP Readings from Last 3 Encounters:  04/13/20 126/86  03/23/20 108/76  03/16/20 130/90    Assessment/Interventions: Review of patient past medical history, allergies, medications, health status, including review of consultants reports, laboratory and other test data, was performed as part of comprehensive evaluation and provision of chronic care management services.   HIV Biktarvy Plan: At goal,  patient stable/ symptoms controlled   HTN -Doesn't test Spironolactone 187m Plan: At  goal,  patient stable/ symptoms controlled   DM -Doesn't test, needs new meter -Trulicity -Metformin Plan: Will get new meter, then start calling monthly to improve sugars in March   HRT -Estradiol -Spironolactone 1031mPlan: At goal,  patient stable/ symptoms controlled   Mood -Thinks needs higher dose Escitalopram (Mental Health Plan: Will ask PCP to try SNRI to help with pain and mood  Pain Nerve pain, major back Sx a year -Patient didn't give a pain scale despite multiple inquiries. Simply states, "Back spasms and tightens up" but she's content on therapy at the moment Gabapentin Tizanidine Celecoxib 10040mID Plan: At goal,  patient stable/ symptoms controlled  Meds: -[facility]'s   SDOH (Social Determinants of Health) assessments and interventions performed:    Care Plan  Allergies  Allergen Reactions  . Propoxyphene N-Acetaminophen Other (See Comments)    Stomach cramps    Medications Reviewed Today    Reviewed by KenLane HackerPHAtlantic Surgical Center LLCharmacist) on 08/05/20 at 1116  Med List Status: <None>  Medication Order Taking? Sig Documenting Provider Last Dose Status Informant  ACCU-CHEK SOFTCLIX LANCETS lancets [id number] 1 each by Other route 3 (three) times daily. GomClent DemarkA-C Taking Active Self  albuterol (VENTOLIN HFA) 108 (90 Base) MCG/ACT inhaler [id number] Inhale 2 puffs into the lungs every 6 (six) hours as needed for wheezing or shortness of breath.  Patient not taking: Reported on 08/05/2020   [name] Not Taking  Active   bictegravir-emtricitabine-tenofovir AF (BIKTARVY) 50-200-25 MG TABS tablet [id number] Yes Take 1 tablet by mouth daily. [name], [name], MD Taking Active   blood glucose meter kit and supplies [id number] Yes Dispense based on patient and insurance preference. Use up to four times daily as directed. (FOR ICD-10 E10.9, E11.9). [name], DO Taking Active Self  celecoxib  (CELEBREX) 100 MG capsule [id number] Yes Take 100 mg by mouth 2 (two) times daily. [provider] Taking Active   Dulaglutide (TRULICITY) 9.38 BO/1.7PZ SOPN [id number] Yes Inject 0.75 mg into the skin once a week. [name] Perna, NP Taking Active   escitalopram (LEXAPRO) 20 MG tablet [id number] Yes Take 1 tablet (20 mg total) by mouth at bedtime. [name] Perna, NP Taking Active   estradiol (ESTRACE) 2 MG tablet [id number] Yes Take 1 tablet (2 mg total) by mouth daily. [name], [name], MD Taking Active   gabapentin (NEURONTIN) 300 MG capsule [id number] Yes Take by mouth. [provider] Taking Active            Med Note (ROBB, [name]   Fri Jul 17, 2020  2:22 PM) Taking 2 capsules, 1 in the afternoon and 2 at bedtime  metFORMIN (GLUCOPHAGE) 1000 MG tablet [id number] Yes Take 1 tablet (1,000 mg total) by mouth 2 (two) times daily with a meal. [name] Perna, NP Taking Active   methocarbamol (ROBAXIN) 500 MG tablet [id number] No Take by mouth.  Patient not taking: Reported on 08/05/2020   [provider] Not Taking Consider Medication Status and Discontinue   spironolactone (ALDACTONE) 100 MG tablet [id number] Yes Take 1 tablet (100 mg total) by mouth daily. [name], [name], MD Taking Active   tiZANidine (ZANAFLEX) 2 MG tablet [id number] Yes Take 2 mg by mouth 3 (three) times daily. [provider] Taking Active            Med Note [name] Aug 05, 2020 10:50 AM)            Patient Active Problem List   Diagnosis Date Noted  . S/P lumbar fusion 01/09/2019  . Medication monitoring encounter 09/12/2018  . Ischemic stroke ([place]) 10/31/2017  . Visual field defect   . Complicated migraine   . Migraine with visual aura   . Essential hypertension 06/01/2016  . Diabetes mellitus type 2 in obese ([place]) 06/01/2016  . Poorly controlled diabetes mellitus ([place]) 04/27/2015  . Hearing loss secondary to cerumen impaction 02/07/2013  .  Smoker 03/07/2011  . Asthma 02/07/2008  . Male-to-male transgender person 10/31/2007  . Depression with anxiety 10/17/2007  . Human immunodeficiency virus (HIV) disease ([place]) 04/14/2006  . HIDRADENITIS SUPPURATIVA 04/14/2006    Conditions to be addressed/monitored: HTN, DM and Pain   Care Plan : Medication Management  Updates made by Lane Hacker, [place] since 08/05/2020 12:00 AM    Problem: Health Promotion or Disease Self-Management (General Plan of Care)     Goal: Medication Management   Note:   Current Barriers:  . Unable to independently monitor therapeutic efficacy . Unable to achieve control of DM  . Does not maintain contact with provider office . Does not contact provider office for questions/concerns . Lost monitor in move (Changed apartments in October 2021) and hasn't checked sugars since  Pharmacist Clinical Goal(s):  Marland Kitchen Over the next 30 days, patient will contact provider office for questions/concerns as evidenced notation of same in electronic health record through collaboration with  PharmD and provider.  .   Interventions: . Inter-disciplinary care team collaboration (see longitudinal plan of care) . Comprehensive medication review performed; medication list updated in electronic medical record  _0 @ _1 @ _2 @  Patient Goals/Self-Care Activities . Over the next 30 days, patient will:  - take medications as prescribed check glucose Daily, document, and provide at future appointments collaborate with provider on medication access solutions  Follow Up Plan: The care management team will reach out to the patient again over the next 30 days.     Task: Mutually Develop and Royce Macadamia Achievement of Patient Goals   Note:   Care Management Activities:    - verbalization of feelings encouraged    Notes:      Medication Assistance: None required. Patient affirms current coverage meets needs.   Follow up: Agree  Plan:  The care management team will reach out to the patient again over the next 30 days.   [place] Constable, Pharm.D., Managed Medicaid Pharmacist - [phone]

## 2020-08-07 MED FILL — TRULICITY 0.75 MG/0.5 ML PE: 0.75 | 28 days supply | Qty: 2 | Fill #3

## 2020-08-08 DIAGNOSIS — R079 Chest pain, unspecified: Secondary | ICD-10-CM | POA: Diagnosis not present

## 2020-08-08 DIAGNOSIS — R0602 Shortness of breath: Secondary | ICD-10-CM | POA: Diagnosis not present

## 2020-08-08 DIAGNOSIS — K2100 Gastro-esophageal reflux disease with esophagitis, without bleeding: Secondary | ICD-10-CM | POA: Diagnosis not present

## 2020-08-08 DIAGNOSIS — R000 Tachycardia, unspecified: Secondary | ICD-10-CM | POA: Diagnosis not present

## 2020-08-08 DIAGNOSIS — R0789 Other chest pain: Secondary | ICD-10-CM | POA: Diagnosis not present

## 2020-08-11 ENCOUNTER — Other Ambulatory Visit: Payer: Self-pay

## 2020-08-11 ENCOUNTER — Encounter (INDEPENDENT_AMBULATORY_CARE_PROVIDER_SITE_OTHER): Payer: Medicaid Other | Admitting: *Deleted

## 2020-08-11 VITALS — BP 129/89 | HR 104 | Temp 98.5°F | Wt 221.2 lb

## 2020-08-11 DIAGNOSIS — Z419 Encounter for procedure for purposes other than remedying health state, unspecified: Secondary | ICD-10-CM | POA: Diagnosis not present

## 2020-08-11 DIAGNOSIS — Z006 Encounter for examination for normal comparison and control in clinical research program: Secondary | ICD-10-CM
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
## 2020-08-18 ENCOUNTER — Encounter (INDEPENDENT_AMBULATORY_CARE_PROVIDER_SITE_OTHER): Payer: Self-pay | Admitting: Primary Care

## 2020-08-18 ENCOUNTER — Other Ambulatory Visit: Payer: Self-pay

## 2020-08-18 ENCOUNTER — Ambulatory Visit (INDEPENDENT_AMBULATORY_CARE_PROVIDER_SITE_OTHER): Payer: Medicaid Other | Admitting: Primary Care

## 2020-08-18 ENCOUNTER — Other Ambulatory Visit: Payer: Self-pay | Admitting: *Deleted

## 2020-08-18 ENCOUNTER — Other Ambulatory Visit (INDEPENDENT_AMBULATORY_CARE_PROVIDER_SITE_OTHER): Payer: Self-pay | Admitting: Primary Care

## 2020-08-18 VITALS — BP 137/86 | HR 101 | Temp 97.5°F | Ht 71.0 in | Wt 223.6 lb

## 2020-08-18 DIAGNOSIS — L601 Onycholysis: Secondary | ICD-10-CM | POA: Diagnosis not present

## 2020-08-18 DIAGNOSIS — E781 Pure hyperglyceridemia: Secondary | ICD-10-CM | POA: Diagnosis not present

## 2020-08-18 DIAGNOSIS — K219 Gastro-esophageal reflux disease without esophagitis: Secondary | ICD-10-CM | POA: Diagnosis not present

## 2020-08-18 DIAGNOSIS — F321 Major depressive disorder, single episode, moderate: Secondary | ICD-10-CM | POA: Diagnosis not present

## 2020-08-18 DIAGNOSIS — E119 Type 2 diabetes mellitus without complications: Secondary | ICD-10-CM | POA: Diagnosis not present

## 2020-08-18 DIAGNOSIS — Z794 Long term (current) use of insulin: Secondary | ICD-10-CM

## 2020-08-18 DIAGNOSIS — Z760 Encounter for issue of repeat prescription: Secondary | ICD-10-CM | POA: Diagnosis not present

## 2020-08-18 DIAGNOSIS — Z09 Encounter for follow-up examination after completed treatment for conditions other than malignant neoplasm: Secondary | ICD-10-CM

## 2020-08-18 LAB — POCT GLYCOSYLATED HEMOGLOBIN (HGB A1C): Hemoglobin A1C: 10.1 % — AB (ref 4.0–5.6)

## 2020-08-18 LAB — GLUCOSE, POCT (MANUAL RESULT ENTRY): POC Glucose: 340 mg/dl — AB (ref 70–99)

## 2020-08-18 MED ORDER — PANTOPRAZOLE SODIUM 40 MG PO TBEC: 40.0000 mg | DELAYED_RELEASE_TABLET | Freq: Every day | ORAL | 1 refills | Status: DC

## 2020-08-18 MED ORDER — DULAGLUTIDE 1.5 MG/0.5ML ~~LOC~~ SOAJ: 1.5000 mg | SUBCUTANEOUS | 2 refills | Status: DC

## 2020-08-18 MED ORDER — FENOFIBRATE 48 MG PO TABS: 48.0000 mg | ORAL_TABLET | Freq: Every day | ORAL | 1 refills | Status: DC

## 2020-08-18 MED ORDER — GLIPIZIDE 10 MG PO TABS: 10.0000 mg | ORAL_TABLET | Freq: Two times a day (BID) | ORAL | 3 refills | Status: DC

## 2020-08-18 MED ORDER — ESCITALOPRAM OXALATE 20 MG PO TABS: 20.0000 mg | ORAL_TABLET | Freq: Every day | ORAL | 1 refills | Status: DC

## 2020-08-18 MED FILL — FENOFIBRATE 48 MG TABS: 48 | 90 days supply | Qty: 90 | Fill #0

## 2020-08-18 MED FILL — glipiZIDE 10 MG TABS: 10 | 30 days supply | Qty: 60 | Fill #0

## 2020-08-18 NOTE — Progress Notes (Signed)
HPI [name] 50 y.o.adult male  presents for follow up from the Emergency room on 08/08/20 for localized chest pain upper chest and shortness of breath. ED discharge witha typical chest pain through the epigastrium and associated with eating and reflux-type symptoms.  Symptoms have resolved. Reviewed labs elevated TG.  Past Medical History:  Diagnosis Date  . Asthma   . Depression   . Diabetes mellitus   . GERD (gastroesophageal reflux disease)   . HIV disease ([place])   . Hypertension   . 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 Perirectal abscess 03/30/2017  . Pneumonia   . Polyuria 04/27/2015  . Poorly controlled diabetes mellitus ([place]) 04/27/2015  . Testicular mass 06/20/2016  . Transgender 04/27/2015     Allergies  Allergen Reactions  . Propoxyphene N-Acetaminophen Other (See Comments)    Stomach cramps      Current Outpatient Medications on File Prior to Visit  Medication Sig Dispense Refill  . ACCU-CHEK SOFTCLIX LANCETS lancets 1 each by Other route 3 (three) times daily. 100 each 5  . albuterol (VENTOLIN HFA) 108 (90 Base) MCG/ACT inhaler Inhale 2 puffs into the lungs every 6 (six) hours as needed for wheezing or shortness of breath. (Patient not taking: Reported on 08/05/2020) 6.7 g 1  . bictegravir-emtricitabine-tenofovir AF (BIKTARVY) 50-200-25 MG TABS tablet Take 1 tablet by mouth daily. 30 tablet 11  . blood glucose meter kit and supplies Dispense based on patient and insurance preference. Use up to four times daily as directed. (FOR ICD-10 E10.9, E11.9). 1 each 0  . celecoxib (CELEBREX) 100 MG capsule Take 100 mg by mouth 2 (two) times daily.    . Dulaglutide (TRULICITY) 0.51 TM/2.1RZ SOPN Inject 0.75 mg into the skin once a week. 0.75 mL 6  . escitalopram (LEXAPRO) 20 MG tablet Take 1 tablet (20 mg total) by mouth at bedtime. 90 tablet 1  . estradiol (ESTRACE) 2 MG tablet Take 1 tablet (2 mg total) by mouth daily. 30 tablet 11  . gabapentin (NEURONTIN) 300 MG  capsule Take by mouth.    . metFORMIN (GLUCOPHAGE) 1000 MG tablet Take 1 tablet (1,000 mg total) by mouth 2 (two) times daily with a meal. 180 tablet 1  . methocarbamol (ROBAXIN) 500 MG tablet Take by mouth. (Patient not taking: Reported on 08/05/2020)    . spironolactone (ALDACTONE) 100 MG tablet Take 1 tablet (100 mg total) by mouth daily. 30 tablet 11  . tiZANidine (ZANAFLEX) 2 MG tablet Take 2 mg by mouth 3 (three) times daily.     No current facility-administered medications on file prior to visit.    ROS: all negative except above.   Physical Exam: Filed Weights   08/18/20 0932  Weight: 223 lb 9.6 oz (101.4 kg)   BP 137/86 (BP Location: Left Arm, Patient Position: Sitting, Cuff Size: Large)   Pulse (!) 101   Temp (!) 97.5 F (36.4 C) (Temporal)   Ht '5\' 11"'  (1.803 m)   Wt 223 lb 9.6 oz (101.4 kg)   SpO2 100%   BMI 31.19 kg/m   General Appearance: Well nourished, obese male  in no apparent distress. Eyes: PERRLA, EOMs, conjunctiva no swelling or erythema Sinuses: No Frontal/maxillary tenderness ENT/Mouth: Ext aud canals clear, TMs without erythema, bulging.  Neck: Supple, thyroid normal.  Respiratory: Respiratory effort normal, BS equal bilaterally without rales, rhonchi, wheezing or stridor.  Cardio: RRR with no MRGs. Brisk peripheral pulses without edema.  Abdomen: Soft, + BS.  Non  tender, no guarding, rebound, hernias, masses. Lymphatics: Non tender without lymphadenopathy.  Musculoskeletal: Full ROM, 5/5 strength, normal gait.  Skin: Warm, dry without rashes, lesions, ecchymosis.  Neuro: Cranial nerves intact. Normal muscle tone, no cerebellar symptoms. Sensation intact.  Psych: Awake and oriented X 3, normal affect, Insight and Judgment appropriate.    [name] was seen today for hospitalization follow-up and diabetes.  Diagnoses and all orders for this visit:  Type 2 diabetes mellitus without complication, with long-term current use of insulin (HCC) Goal of  therapy: Less than 6.5 hemoglobin A1c. Continue to monitor foods that are high in carbohydrates are the following rice, potatoes, breads, sugars, and pastas.  Reduction in the intake (eating) will assist in lowering your blood sugars.-      Microalbumin/Creatinine Ratio, Urine -     HgB A1c 10.1 previously 9.8 -     Glucose (CBG) -     Dulaglutide 1.5 MG/0.5ML SOPN; Inject 1.5 mg into the skin once a week.( increase Dualglutide  1.5 mg) -     glipiZIDE (GLUCOTROL) 10 MG tablet; Take 1 tablet (10 mg total) by mouth 2 (two) times daily before a meal.  Hospital discharge follow-up Emergency room on 08/08/20 for localized chest pain upper chest and shortness of breath.dx with GERD and gave PPI F/U with PCP  Hypertriglyceridemia -     fenofibrate (TRICOR) 48 MG tablet; Take 1 tablet (48 mg total) by mouth daily.  Moderate single current episode of major depressive disorder (HCC) -     escitalopram (LEXAPRO) 20 MG tablet; Take 1 tablet (20 mg total) by mouth at bedtime.  Gastroesophageal reflux disease without esophagitis Discussed eating small frequent meal, reduction in acidic foods, fried foods ,spicy foods, alcohol caffeine and tobacco and certain medications. Avoid laying down after eating 54mns-1hour, elevated head of the bed.   Medication refill -     fenofibrate (TRICOR) 48 MG tablet; Take 1 tablet (48 mg total) by mouth daily. -     Dulaglutide 1.5 MG/0.5ML SOPN; Inject 1.5 mg into the skin once a week. -     glipiZIDE (GLUCOTROL) 10 MG tablet; Take 1 tablet (10 mg total) by mouth 2 (two) times daily before a meal. -     escitalopram (LEXAPRO) 20 MG tablet; Take 1 tablet (20 mg total) by mouth at bedtime. -     pantoprazole (PROTONIX) 40 MG tablet; Take 1 tablet (40 mg total) by mouth daily.   MKerin Perna NP
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
## 2020-08-18 NOTE — Patient Instructions (Signed)
Visit Information  Ms. Hartstein was given information about Medicaid Managed Care team care coordination services as a part of their [facility] Medicaid benefit. [name] verbally consented to engagement with the [facility] Managed Care team.   For questions related to your [facility] health plan, please call: [phone]  If you would like to schedule transportation through your [facility] plan, please call the following number at least 2 days in advance of your appointment: [phone]   Ms. Nidiffer - following are the goals we discussed in your visit today:  Goals Addressed            This Visit's Progress   . Find Help in My Community       Timeframe:  Long-Range Goal Priority:  Medium Start Date: 07/17/20                            Expected End Date:  09/18/20                     Follow Up Date 09/18/20   - begin a notebook of services in my neighborhood or community - call 211 when I need some help - follow-up on any referrals for help I am given - make a list of family or friends that I can call    Why is this important?    Knowing how and where to find help for yourself or family in your neighborhood and community is an important skill.   You will want to take some steps to learn how.        . Make and Keep All Appointments       Timeframe:  Long-Range Goal Priority:  High Start Date:  07/17/20                           Expected End Date:  09/18/20                     Follow Up Date 09/18/20   -reschedule follow up visit with your PCP -call [phone] for medical transportation provided by [facility] - ask family or friend for a ride - call to cancel if needed - keep a calendar with prescription refill dates - keep a calendar with appointment dates    Why is this important?    Part of staying healthy is seeing the doctor for follow-up care.   If you forget your appointments, there are some things you can do to stay on track.        . Manage  Chronic Pain       Timeframe:  Long-Range Goal Priority:  High Start Date:  07/17/20                           Expected End Date:      08/17/20                 Follow Up Date 09/18/20   - call for medicine refill 2 or 3 days before it runs out - develop a personal pain management plan - keep track of prescription refills - track what makes the pain worse and what makes it better - use ice or heat for pain relief    Why is this important?    Day-to-day life can be hard when you have chronic pain.  Pain medicine is just one piece of the treatment puzzle.   You can try these action steps to help you manage your pain.        . Manage My Emotions       Timeframe:  Long-Range Goal Priority:  High Start Date:   07/17/20                          Expected End Date:  09/18/20                     Follow Up Date 09/18/20  - walk for 20 minutes, 5 days a week - laugh; watch a funny movie or comedian - practice relaxation or meditation daily - talk about feelings with a friend, family or spiritual advisor - practice positive thinking and self-talk    Why is this important?    When you are stressed, down or upset, your body reacts too.   For example, your blood pressure may get higher; you may have a headache or stomachache.   When your emotions get the best of you, your body's ability to fight off cold and flu gets weak.   These steps will help you manage your emotions.         . Monitor and Manage My Blood Sugar-Diabetes Type 2       Timeframe:  Long-Range Goal Priority:  High Start Date:  08/18/20                           Expected End Date: 11/18/20                 Follow Up Date 09/18/20   - check blood sugar at prescribed times - check blood sugar if I feel it is too high or too low - enter blood sugar readings and medication or insulin into daily log - take the blood sugar log to all doctor visits  - walk for 20 minutes, 5 days a week - maintain a heart healthy, low carbohydrate  diet   Why is this important?    Checking your blood sugar at home helps to keep it from getting very high or very low.   Writing the results in a diary or log helps the doctor know how to care for you.   Your blood sugar log should have the time, date and the results.   Also, write down the amount of insulin or other medicine that you take.   Other information, like what you ate, exercise done and how you were feeling, will also be helpful.     Notes:        Please see education materials related to diet provided by MyChart link.    Diabetes Mellitus and Nutrition, Adult When you have diabetes, or diabetes mellitus, it is very important to have healthy eating habits because your blood sugar (glucose) levels are greatly affected by what you eat and drink. Eating healthy foods in the right amounts, at about the same times every day, can help you:  Control your blood glucose.  Lower your risk of heart disease.  Improve your blood pressure.  Reach or maintain a healthy weight. What can affect my meal plan? Every person with diabetes is different, and each person has different needs for a meal plan. Your health care provider may recommend that you work with a dietitian to make a meal plan  that is best for you. Your meal plan may vary depending on factors such as:  The calories you need.  The medicines you take.  Your weight.  Your blood glucose, blood pressure, and cholesterol levels.  Your activity level.  Other health conditions you have, such as heart or kidney disease. How do carbohydrates affect me? Carbohydrates, also called carbs, affect your blood glucose level more than any other type of food. Eating carbs naturally raises the amount of glucose in your blood. Carb counting is a method for keeping track of how many carbs you eat. Counting carbs is important to keep your blood glucose at a healthy level, especially if you use insulin or take certain oral diabetes  medicines. It is important to know how many carbs you can safely have in each meal. This is different for every person. Your dietitian can help you calculate how many carbs you should have at each meal and for each snack. How does alcohol affect me? Alcohol can cause a sudden decrease in blood glucose (hypoglycemia), especially if you use insulin or take certain oral diabetes medicines. Hypoglycemia can be a life-threatening condition. Symptoms of hypoglycemia, such as sleepiness, dizziness, and confusion, are similar to symptoms of having too much alcohol.  Do not drink alcohol if: ? Your health care provider tells you not to drink. ? You are pregnant, may be pregnant, or are planning to become pregnant.  If you drink alcohol: ? Do not drink on an empty stomach. ? Limit how much you use to:  0-1 drink a day for women.  0-2 drinks a day for men. ? Be aware of how much alcohol is in your drink. In the [place] one drink equals one 12 oz bottle of beer (355 mL), one 5 oz glass of wine (148 mL), or one 1 oz glass of hard liquor (44 mL). ? Keep yourself hydrated with water, diet soda, or unsweetened iced tea.  Keep in mind that regular soda, juice, and other mixers may contain a lot of sugar and must be counted as carbs. What are tips for following this plan? Reading food labels  Start by checking the serving size on the "Nutrition Facts" label of packaged foods and drinks. The amount of calories, carbs, fats, and other nutrients listed on the label is based on one serving of the item. Many items contain more than one serving per package.  Check the total grams (g) of carbs in one serving. You can calculate the number of servings of carbs in one serving by dividing the total carbs by 15. For example, if a food has 30 g of total carbs per serving, it would be equal to 2 servings of carbs.  Check the number of grams (g) of saturated fats and trans fats in one serving. Choose foods that have a low  amount or none of these fats.  Check the number of milligrams (mg) of salt (sodium) in one serving. Most people should limit total sodium intake to less than 2,300 mg per day.  Always check the nutrition information of foods labeled as "low-fat" or "nonfat." These foods may be higher in added sugar or refined carbs and should be avoided.  Talk to your dietitian to identify your daily goals for nutrients listed on the label. Shopping  Avoid buying canned, pre-made, or processed foods. These foods tend to be high in fat, sodium, and added sugar.  Shop around the outside edge of the grocery store. This is where you will  most often find fresh fruits and vegetables, bulk grains, fresh meats, and fresh dairy. Cooking  Use low-heat cooking methods, such as baking, instead of high-heat cooking methods like deep frying.  Cook using healthy oils, such as olive, canola, or sunflower oil.  Avoid cooking with butter, cream, or high-fat meats. Meal planning  Eat meals and snacks regularly, preferably at the same times every day. Avoid going long periods of time without eating.  Eat foods that are high in fiber, such as fresh fruits, vegetables, beans, and whole grains. Talk with your dietitian about how many servings of carbs you can eat at each meal.  Eat 4-6 oz (112-168 g) of lean protein each day, such as lean meat, chicken, fish, eggs, or tofu. One ounce (oz) of lean protein is equal to: ? 1 oz (28 g) of meat, chicken, or fish. ? 1 egg. ?  cup (62 g) of tofu.  Eat some foods each day that contain healthy fats, such as avocado, nuts, seeds, and fish.   What foods should I eat? Fruits Berries. Apples. Oranges. Peaches. Apricots. Plums. Grapes. Mango. Papaya. Pomegranate. Kiwi. Cherries. Vegetables Lettuce. Spinach. Leafy greens, including kale, chard, collard greens, and mustard greens. Beets. Cauliflower. Cabbage. Broccoli. Carrots. Green beans. Tomatoes. Peppers. Onions. Cucumbers. Brussels  sprouts. Grains Whole grains, such as whole-wheat or whole-grain bread, crackers, tortillas, cereal, and pasta. Unsweetened oatmeal. Quinoa. Brown or wild rice. Meats and other proteins Seafood. Poultry without skin. Lean cuts of poultry and beef. Tofu. Nuts. Seeds. Dairy Low-fat or fat-free dairy products such as milk, yogurt, and cheese. The items listed above may not be a complete list of foods and beverages you can eat. Contact a dietitian for more information. What foods should I avoid? Fruits Fruits canned with syrup. Vegetables Canned vegetables. Frozen vegetables with butter or cream sauce. Grains Refined white flour and flour products such as bread, pasta, snack foods, and cereals. Avoid all processed foods. Meats and other proteins Fatty cuts of meat. Poultry with skin. Breaded or fried meats. Processed meat. Avoid saturated fats. Dairy Full-fat yogurt, cheese, or milk. Beverages Sweetened drinks, such as soda or iced tea. The items listed above may not be a complete list of foods and beverages you should avoid. Contact a dietitian for more information. Questions to ask a health care provider  Do I need to meet with a diabetes educator?  Do I need to meet with a dietitian?  What number can I call if I have questions?  When are the best times to check my blood glucose? Where to find more information:  American Diabetes Association: diabetes.org  Academy of Nutrition and Dietetics: www.eatright.[employer] of Diabetes and Digestive and Kidney Diseases: Des[place]inesFuneral.dk  Association of Diabetes Care and Education Specialists: www.diabeteseducator.org Summary  It is important to have healthy eating habits because your blood sugar (glucose) levels are greatly affected by what you eat and drink.  A healthy meal plan will help you control your blood glucose and maintain a healthy lifestyle.  Your health care provider may recommend that you work with a  dietitian to make a meal plan that is best for you.  Keep in mind that carbohydrates (carbs) and alcohol have immediate effects on your blood glucose levels. It is important to count carbs and to use alcohol carefully. This information is not intended to replace advice given to you by your health care provider. Make sure you discuss any questions you have with your health care provider. Document Revised:  05/07/2019 Document Reviewed: 05/07/2019 Elsevier Patient Education  2021 [place].    [email or web address]">  DASH Eating Plan DASH stands for Dietary Approaches to Stop Hypertension. The DASH eating plan is a healthy eating plan that has been shown to:  Reduce high blood pressure (hypertension).  Reduce your risk for type 2 diabetes, heart disease, and stroke.  Help with weight loss. What are tips for following this plan? Reading food labels  Check food labels for the amount of salt (sodium) per serving. Choose foods with less than 5 percent of the Daily Value of sodium. Generally, foods with less than 300 milligrams (mg) of sodium per serving fit into this eating plan.  To find whole grains, look for the word "whole" as the first word in the ingredient list. Shopping  Buy products labeled as "low-sodium" or "no salt added."  Buy fresh foods. Avoid canned foods and pre-made or frozen meals. Cooking  Avoid adding salt when cooking. Use salt-free seasonings or herbs instead of table salt or sea salt. Check with your health care provider or pharmacist before using salt substitutes.  Do not fry foods. Cook foods using healthy methods such as baking, boiling, grilling, roasting, and broiling instead.  Cook with heart-healthy oils, such as olive, canola, avocado, soybean, or sunflower oil. Meal planning  Eat a balanced diet that includes: ? 4 or more servings of fruits and 4 or more servings of vegetables each day. Try to fill  one-half of your plate with fruits and vegetables. ? 6-8 servings of whole grains each day. ? Less than 6 oz (170 g) of lean meat, poultry, or fish each day. A 3-oz (85-g) serving of meat is about the same size as a deck of cards. One egg equals 1 oz (28 g). ? 2-3 servings of low-fat dairy each day. One serving is 1 cup (237 mL). ? 1 serving of nuts, seeds, or beans 5 times each week. ? 2-3 servings of heart-healthy fats. Healthy fats called omega-3 fatty acids are found in foods such as walnuts, flaxseeds, fortified milks, and eggs. These fats are also found in cold-water fish, such as sardines, salmon, and mackerel.  Limit how much you eat of: ? Canned or prepackaged foods. ? Food that is high in trans fat, such as some fried foods. ? Food that is high in saturated fat, such as fatty meat. ? Desserts and other sweets, sugary drinks, and other foods with added sugar. ? Full-fat dairy products.  Do not salt foods before eating.  Do not eat more than 4 egg yolks a week.  Try to eat at least 2 vegetarian meals a week.  Eat more home-cooked food and less restaurant, buffet, and fast food.   Lifestyle  When eating at a restaurant, ask that your food be prepared with less salt or no salt, if possible.  If you drink alcohol: ? Limit how much you use to:  0-1 drink a day for women who are not pregnant.  0-2 drinks a day for men. ? Be aware of how much alcohol is in your drink. In the [place] one drink equals one 12 oz bottle of beer (355 mL), one 5 oz glass of wine (148 mL), or one 1 oz glass of hard liquor (44 mL). General information  Avoid eating more than 2,300 mg of salt a day. If you have hypertension, you may need to reduce your sodium intake to 1,500 mg a day.  Work with your health care provider to maintain a  healthy body weight or to lose weight. Ask what an ideal weight is for you.  Get at least 30 minutes of exercise that causes your heart to beat faster (aerobic exercise)  most days of the week. Activities may include walking, swimming, or biking.  Work with your health care provider or dietitian to adjust your eating plan to your individual calorie needs. What foods should I eat? Fruits All fresh, dried, or frozen fruit. Canned fruit in natural juice (without added sugar). Vegetables Fresh or frozen vegetables (raw, steamed, roasted, or grilled). Low-sodium or reduced-sodium tomato and vegetable juice. Low-sodium or reduced-sodium tomato sauce and tomato paste. Low-sodium or reduced-sodium canned vegetables. Grains Whole-grain or whole-wheat bread. Whole-grain or whole-wheat pasta. Brown rice. [place]. Bulgur. Whole-grain and low-sodium cereals. Pita bread. Low-fat, low-sodium crackers. Whole-wheat flour tortillas. Meats and other proteins Skinless chicken or [place]. Ground chicken or [place]. Pork with fat trimmed off. Fish and seafood. Egg whites. Dried beans, peas, or lentils. Unsalted nuts, nut butters, and seeds. Unsalted canned beans. Lean cuts of beef with fat trimmed off. Low-sodium, lean precooked or cured meat, such as sausages or meat loaves. Dairy Low-fat (1%) or fat-free (skim) milk. Reduced-fat, low-fat, or fat-free cheeses. Nonfat, low-sodium ricotta or cottage cheese. Low-fat or nonfat yogurt. Low-fat, low-sodium cheese. Fats and oils Soft margarine without trans fats. Vegetable oil. Reduced-fat, low-fat, or light mayonnaise and salad dressings (reduced-sodium). Canola, safflower, olive, avocado, soybean, and sunflower oils. Avocado. Seasonings and condiments Herbs. Spices. Seasoning mixes without salt. Other foods Unsalted popcorn and pretzels. Fat-free sweets. The items listed above may not be a complete list of foods and beverages you can eat. Contact a dietitian for more information. What foods should I avoid? Fruits Canned fruit in a light or heavy syrup. Fried fruit. Fruit in cream or butter sauce. Vegetables Creamed or fried  vegetables. Vegetables in a cheese sauce. Regular canned vegetables (not low-sodium or reduced-sodium). Regular canned tomato sauce and paste (not low-sodium or reduced-sodium). Regular tomato and vegetable juice (not low-sodium or reduced-sodium). Angie Fava. Olives. Grains Baked goods made with fat, such as croissants, muffins, or some breads. Dry pasta or rice meal packs. Meats and other proteins Fatty cuts of meat. Ribs. Fried meat. Berniece Salines. Bologna, salami, and other precooked or cured meats, such as sausages or meat loaves. Fat from the back of a pig (fatback). Bratwurst. Salted nuts and seeds. Canned beans with added salt. Canned or smoked fish. Whole eggs or egg yolks. Chicken or [place] with skin. Dairy Whole or 2% milk, cream, and half-and-half. Whole or full-fat cream cheese. Whole-fat or sweetened yogurt. Full-fat cheese. Nondairy creamers. Whipped toppings. Processed cheese and cheese spreads. Fats and oils Butter. Stick margarine. Lard. Shortening. Ghee. Bacon fat. Tropical oils, such as coconut, palm kernel, or palm oil. Seasonings and condiments Onion salt, garlic salt, seasoned salt, table salt, and sea salt. Worcestershire sauce. Tartar sauce. Barbecue sauce. Teriyaki sauce. Soy sauce, including reduced-sodium. Steak sauce. Canned and packaged gravies. Fish sauce. Oyster sauce. Cocktail sauce. Store-bought horseradish. Ketchup. Mustard. Meat flavorings and tenderizers. Bouillon cubes. Hot sauces. Pre-made or packaged marinades. Pre-made or packaged taco seasonings. Relishes. Regular salad dressings. Other foods Salted popcorn and pretzels. The items listed above may not be a complete list of foods and beverages you should avoid. Contact a dietitian for more information. Where to find more information  National Heart, Lung, and Blood Institute: https://wilson-eaton.com/  American Heart Association: www.heart.org  Academy of Nutrition and Dietetics: www.eatright.[place]: www.kidney.org Summary  The DASH eating plan is a healthy eating plan that has been shown to reduce high blood pressure (hypertension). It may also reduce your risk for type 2 diabetes, heart disease, and stroke.  When on the DASH eating plan, aim to eat more fresh fruits and vegetables, whole grains, lean proteins, low-fat dairy, and heart-healthy fats.  With the DASH eating plan, you should limit salt (sodium) intake to 2,300 mg a day. If you have hypertension, you may need to reduce your sodium intake to 1,500 mg a day.  Work with your health care provider or dietitian to adjust your eating plan to your individual calorie needs. This information is not intended to replace advice given to you by your health care provider. Make sure you discuss any questions you have with your health care provider. Document Revised: 05/03/2019 Document Reviewed: 05/03/2019 Elsevier Patient Education  2021 [place] for Eating Away From Home If You Have Diabetes Controlling your blood sugar (glucose) levels can be challenging when you do not prepare your own meals. The following tips can help you manage your diabetes when you eat away from home. If you have questions or if you need help, work with your health care provider or diet and nutrition specialist (dietitian). Planning ahead Plan ahead if you know you will be eating away from home:  Try to eat your meals and snacks at about the same time each day. If you know your meal is going to be later than normal, make sure you have a small snack. Being very hungry can cause you to make unhealthy food choices.  Make a list of restaurants near you that offer healthy choices. If a restaurant has a carry-out menu, take the menu home and plan what you will order ahead of time.  Look up the restaurant you want to eat at online. Many chain and fast-food restaurants list nutritional information online. Use this information to choose the  healthiest options and to calculate how many carbohydrates will be in your meal.  Use a carbohydrate-counting book or mobile app to look up the carbohydrate content and serving size of the foods you want to eat. Free foods A "free food" is any food or drink that has less than 5 grams of carbohydrates and less than 20 calories per serving. These food are high in fiber and nutrients and low in calories, carbohydrates, and fats. Free foods include:  Non-starchy vegetables, such as carrots, broccoli, celery, lettuce, or green beans.  Non-sugar drinks, such as water, unsweetened coffee, or unsweetened tea.  Low-calorie salad dressings.  Sugar-free gelatin. Starting meals with a salad full of vegetables is a healthy choice that includes a lot of free foods. Avoid high-calorie salad toppings like bacon, cheese, and high-fat dressings. Ask for your salad dressing to be served on the side so that you dip your fork in the dressing and then in the salad. This allows you to control how much dressing you eat and still get the flavor with every bite. Choices to control carbohydrates  Ask your server to take away the bread basket or chips from your table.  Choose light yogurt or [place] yogurt instead of non-fat sweetened yogurt.  Order fresh fruit. A salad bar often offers fresh fruit choices. Avoid canned fruit because it is usually packed in sugar or syrup.  Order a salad, and ask for dressing on the side.  Ask for substitutes. For example, if your meal comes with french fries, ask for a  side salad or steamed veggies instead. If a meal comes with fried chicken, ask for grilled chicken instead.   Beverages  Choose drinks that are low in calories and sugar, such as: ? Water. ? Unsweetened tea or coffee. ? Lowfat milk.  Avoid the following drinks: ? Alcoholic beverages. ? Regular (not diet) sodas. Other tips  If you take insulin, wait to take your insulin once your food arrives to your table.  This will ensure that your insulin and your food are timed correctly.  Become familiar with serving sizes and learn to recognize how many servings are in a portion. Restaurant portions are typically two to three times larger than what you really need.  Ask your server for a to-go box at the beginning of the meal. When your food comes, leave the amount you should have on your plate, and put the rest in the to-go box so that you are not tempted to eat too much.  Consider splitting an entree with someone and ordering a side salad.  Avoid buffets. They are typically too tempting and result in overeating. Where to find more information  American Diabetes Association: www.diabetes.org  American Association of Diabetes Educators: www.diabeteseducator.org Summary  Plan ahead when eating away from home.  Try to eat your meals and snacks at about the same time each day. If you know your meal is going to be later than normal, make sure you have a small snack. Being very hungry can cause you to make unhealthy food choices.  Ask for substitutes. For example, if your meal comes with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

## 2020-08-19 MED FILL — PANTOPRAZOLE SOD DR 40 MG T: 40 | 90 days supply | Qty: 90 | Fill #0

## 2020-08-20 ENCOUNTER — Ambulatory Visit: Payer: Medicaid Other | Admitting: Podiatry

## 2020-08-26 ENCOUNTER — Other Ambulatory Visit: Payer: Self-pay

## 2020-09-01 ENCOUNTER — Other Ambulatory Visit: Payer: Self-pay

## 2020-09-01 ENCOUNTER — Other Ambulatory Visit: Payer: Self-pay | Admitting: Infectious Disease
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
## 2020-09-01 NOTE — Patient Instructions (Signed)
Visit Information  [name] was given information about Medicaid Managed Care team care coordination services as a part of their [facility] Medicaid benefit. [name] verbally consented to engagement with the [facility] Managed Care team.   For questions related to your [facility] health plan, please call: [phone]  If you would like to schedule transportation through your [facility] plan, please call the following number at least 2 days in advance of your appointment: [phone]   Call the [facility] Crisis Line at [phone], at any time, 24 hours a day, 7 days a week. If you are in danger or need immediate medical attention call 911.  [name] - following are the goals we discussed in your visit today:  Goals Addressed   None      Social Worker will follow up in 30 days.   Gus Puma, BSW, [facility]  [place]  High Risk Managed Medicaid Team    Following is a copy of your plan of care:

## 2020-09-02 ENCOUNTER — Other Ambulatory Visit (INDEPENDENT_AMBULATORY_CARE_PROVIDER_SITE_OTHER): Payer: Self-pay | Admitting: Primary Care

## 2020-09-02 ENCOUNTER — Other Ambulatory Visit: Payer: Self-pay

## 2020-09-02 ENCOUNTER — Telehealth (INDEPENDENT_AMBULATORY_CARE_PROVIDER_SITE_OTHER): Payer: Self-pay | Admitting: Primary Care

## 2020-09-02 DIAGNOSIS — E1165 Type 2 diabetes mellitus with hyperglycemia: Secondary | ICD-10-CM

## 2020-09-02 MED ORDER — BLOOD GLUCOSE METER KIT: PACK | 0 refills | Status: DC

## 2020-09-02 MED ORDER — BLOOD GLUCOSE METER KIT: PACK | 0 refills | Status: AC

## 2020-09-02 MED FILL — ACCU-CHEK GUIDE ME W/DEVICE: W/DEVICE | 30 days supply | Qty: 1 | Fill #0

## 2020-09-02 MED FILL — ACCU-CHEK GUIDE TEST STRIP: 25 days supply | Qty: 100 | Fill #0

## 2020-09-02 MED FILL — ACCU-CHEK FASTCLIX LANCETS: 25 days supply | Qty: 102 | Fill #0

## 2020-09-02 NOTE — Telephone Encounter (Signed)
[name] with [place] [facility] is calling and the patient needs new rx for glucometer, lancets and testing strips. [facility] long outpt phone [phone].

## 2020-09-02 NOTE — Patient Outreach (Signed)
Medicaid Managed Care    Pharmacy Note  09/02/2020 Name: [name] MRN: [id number] DOB: 1971/03/15  [name] is a 50 y.o. year old adult who is a primary care patient of [name] Perna, NP. [facility] Managed Care Coordination team was consulted for assistance with disease management and care coordination needs.    Engaged with patient Engaged with patient by telephone for initial visit in response to referral for case management and/or care coordination services.  [name] was given information about Managed Medicaid Care Coordination team services today. [name] agreed to services and verbal consent obtained.   Objective:  Lab Results  Component Value Date   CREATININE 0.82 04/13/2020   CREATININE 0.76 09/18/2019   CREATININE 0.92 08/27/2019    Lab Results  Component Value Date   HGBA1C 10.1 (A) 08/18/2020       Component Value Date/Time   CHOL 134 04/13/2020 0910   CHOL 134 03/23/2020 1422   TRIG 313 (H) 04/13/2020 0910   HDL 25 (L) 04/13/2020 0910   HDL 30 (L) 03/23/2020 1422   CHOLHDL 5.4 (H) 04/13/2020 0910   VLDL 47 (H) 11/01/2017 0334   LDLCALC 71 04/13/2020 0910    Other: (TSH, CBC, Vit D, etc.)  Clinical ASCVD: Yes  The ASCVD Risk score [name], et al., 2013) failed to calculate for the following reasons:   The patient has a prior MI or stroke diagnosis    Other: (CHADS2VASc if Afib, PHQ9 if depression, MMRC or CAT for COPD, ACT, DEXA)  BP Readings from Last 3 Encounters:  08/18/20 137/86  08/11/20 129/89  04/13/20 126/86    Assessment/Interventions: Review of patient past medical history, allergies, medications, health status, including review of consultants reports, laboratory and other test data, was performed as part of comprehensive evaluation and provision of chronic care management services.   HIV Biktarvy Plan: At goal,  patient stable/ symptoms controlled   HTN -Doesn't test Spironolactone 133m Plan: At  goal,  patient stable/ symptoms controlled   DM -Doesn't test, needs new meter -Trulicity -Metformin Feb 2022 Plan: Will get new meter, then start calling monthly to improve sugars in March March 2022: No meter sent by PCP yet, called again and sent msg to get ASAP. Will f/u 1 week  HRT -Estradiol -Spironolactone 1033mPlan: At goal,  patient stable/ symptoms controlled   Mood -Thinks needs higher dose Escitalopram (Mental Health Plan: Will ask PCP to try SNRI to help with pain and mood  Pain Nerve pain, major back Sx a year -Patient didn't give a pain scale despite multiple inquiries. Simply states, "Back spasms and tightens up" but she's content on therapy at the moment Gabapentin Tizanidine Celecoxib 10027mID Plan: At goal,  patient stable/ symptoms controlled  Meds: -[facility]   SDOH (Social Determinants of Health) assessments and interventions performed:    Care Plan  Allergies  Allergen Reactions  . Propoxyphene N-Acetaminophen Other (See Comments)    Stomach cramps    Medications Reviewed Today    Reviewed by RobMelissa MontaneN (Registered Nurse) on 08/18/20 at 1451  Med List Status: <None>  Medication Order Taking? Sig Documenting Provider Last Dose Status Informant  ACCU-CHEK SOFTCLIX LANCETS lancets [id number] 1 each by Other route 3 (three) times daily.  Patient not taking: Reported on 08/18/2020   GomClent DemarkA-C Not Taking Active Self           Med Note (  ROBB, [name]   Tue Aug 18, 2020  2:46 PM) Lost supplies in recent move  albuterol (VENTOLIN HFA) 108 (90 Base) MCG/ACT inhaler [id number] Yes Inhale 2 puffs into the lungs every 6 (six) hours as needed for wheezing or shortness of breath. [name] Perna, NP Taking Active   bictegravir-emtricitabine-tenofovir AF (BIKTARVY) 50-200-25 MG TABS tablet [id number] Yes Take 1 tablet by mouth daily. [name], [name], MD Taking Active   blood glucose meter kit and  supplies [id number] No Dispense based on patient and insurance preference. Use up to four times daily as directed. (FOR ICD-10 E10.9, E11.9).  Patient not taking: Reported on 08/18/2020   [name], DO Not Taking Active Self  celecoxib (CELEBREX) 200 MG capsule [id number] Yes Take 200 mg by mouth 2 (two) times daily. [provider] Taking Active   Dulaglutide 1.5 MG/0.5ML SOPN [id number] Yes Inject 1.5 mg into the skin once a week. [name] Perna, NP Taking Active   escitalopram (LEXAPRO) 20 MG tablet [id number] Yes Take 1 tablet (20 mg total) by mouth at bedtime. [name] Perna, NP Taking Active   estradiol (ESTRACE) 2 MG tablet [id number] Yes Take 1 tablet (2 mg total) by mouth daily. [name], [name], MD Taking Active   fenofibrate (TRICOR) 48 MG tablet [id number] Yes Take 1 tablet (48 mg total) by mouth daily. [name] Perna, NP Taking Active   glipiZIDE (GLUCOTROL) 10 MG tablet [id number] Yes Take 1 tablet (10 mg total) by mouth 2 (two) times daily before a meal. [name] Perna, NP Taking Active   metFORMIN (GLUCOPHAGE) 1000 MG tablet [id number] Yes Take 1 tablet (1,000 mg total) by mouth 2 (two) times daily with a meal. [name] Perna, NP Taking Active   methocarbamol (ROBAXIN) 500 MG tablet [id number] No Take by mouth.  Patient not taking: Reported on 08/18/2020   [provider] Not Taking Active   pantoprazole (PROTONIX) 40 MG tablet [id number] Yes Take 1 tablet (40 mg total) by mouth daily. [name] Perna, NP Taking Active   spironolactone (ALDACTONE) 100 MG tablet [id number] Yes Take 1 tablet (100 mg total) by mouth daily. [name], [name], MD Taking Active   tiZANidine (ZANAFLEX) 2 MG tablet [id number] Yes Take 2 mg by mouth 3 (three) times daily. [provider] Taking Active            Med Note [name] Aug 05, 2020 10:50 AM)            Patient Active Problem List   Diagnosis Date Noted  . S/P lumbar fusion  01/09/2019  . Medication monitoring encounter 09/12/2018  . Ischemic stroke ([place]) 10/31/2017  . Visual field defect   . Complicated migraine   . Migraine with visual aura   . Essential hypertension 06/01/2016  . Diabetes mellitus type 2 in obese ([place]) 06/01/2016  . Poorly controlled diabetes mellitus ([place]) 04/27/2015  . Hearing loss secondary to cerumen impaction 02/07/2013  . Smoker 03/07/2011  . Asthma 02/07/2008  . Male-to-male transgender person 10/31/2007  . Depression with anxiety 10/17/2007  . Human immunodeficiency virus (HIV) disease ([place]) 04/14/2006  . HIDRADENITIS SUPPURATIVA 04/14/2006    Conditions to be addressed/monitored: HTN, DM and Pain   Care Plan : Medication Management  Updates made by Lane Hacker, [place] since 08/05/2020 12:00 AM    Problem: Health Promotion or Disease Self-Management (General Plan of Care)     Goal: Medication Management  Note:   Current Barriers:  . Unable to independently monitor therapeutic efficacy . Unable to achieve control of DM  . Does not maintain contact with provider office . Does not contact provider office for questions/concerns . Lost monitor in move (Changed apartments in October 2021) and hasn't checked sugars since  Pharmacist Clinical Goal(s):  Marland Kitchen Over the next 30 days, patient will contact provider office for questions/concerns as evidenced notation of same in electronic health record through collaboration with PharmD and provider.  .   Interventions: . Inter-disciplinary care team collaboration (see longitudinal plan of care) . Comprehensive medication review performed; medication list updated in electronic medical record  '@RXCPDIABETES' @ '@RXCPMENTALHEALTH' @ '@RXCPOSTEOPOROSIS' @  Patient Goals/Self-Care Activities . Over the next 30 days, patient will:  - take medications as prescribed check glucose Daily, document, and provide at future appointments collaborate with provider on medication access  solutions  Follow Up Plan: The care management team will reach out to the patient again over the next 30 days.     Task: Mutually Develop and Royce Macadamia Achievement of Patient Goals   Note:   Care Management Activities:    - verbalization of feelings encouraged    Notes:      Medication Assistance: None required. Patient affirms current coverage meets needs.   Follow up: Agree  Plan: The care management team will reach out to the patient again over the next 30 days.   [place] Constable, Pharm.D., Managed Medicaid Pharmacist - [phone]
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
## 2020-09-04 MED FILL — BIKTARVY 50-200-25 MG TABS: 50-200-25 | 30 days supply | Qty: 30 | Fill #0

## 2020-09-07 ENCOUNTER — Other Ambulatory Visit (INDEPENDENT_AMBULATORY_CARE_PROVIDER_SITE_OTHER): Payer: Self-pay | Admitting: Primary Care

## 2020-09-07 DIAGNOSIS — Z760 Encounter for issue of repeat prescription: Secondary | ICD-10-CM

## 2020-09-07 DIAGNOSIS — E119 Type 2 diabetes mellitus without complications: Secondary | ICD-10-CM

## 2020-09-07 MED ORDER — DULAGLUTIDE 1.5 MG/0.5ML ~~LOC~~ SOAJ: 1.5000 mg | SUBCUTANEOUS | 1 refills | Status: DC

## 2020-09-07 MED FILL — TRULICITY 1.5 MG/0.5 ML PEN: 1.5 | 28 days supply | Qty: 2 | Fill #0

## 2020-09-07 NOTE — Telephone Encounter (Signed)
Change of pharmacy Requested Prescriptions  Pending Prescriptions Disp Refills  . Dulaglutide 1.5 MG/0.5ML SOPN 15 mL 1    Sig: Inject 1.5 mg into the skin once a week.     Endocrinology:  Diabetes - GLP-1 Receptor Agonists Failed - 09/07/2020  1:56 PM      Failed - HBA1C is between 0 and 7.9 and within 180 days    Hemoglobin A1C  Date Value Ref Range Status  08/18/2020 10.1 (A) 4.0 - 5.6 % Final   Hgb A1c MFr Bld  Date Value Ref Range Status  08/27/2019 8.8 (H) <5.7 % of total Hgb Final    Comment:    For someone without known diabetes, a hemoglobin A1c value of 6.5% or greater indicates that they may have  diabetes and this should be confirmed with a follow-up  test. . For someone with known diabetes, a value <7% indicates  that their diabetes is well controlled and a value  greater than or equal to 7% indicates suboptimal  control. A1c targets should be individualized based on  duration of diabetes, age, comorbid conditions, and  other considerations. . Currently, no consensus exists regarding use of hemoglobin A1c for diagnosis of diabetes for children. Verna Czech - Valid encounter within last 6 months    Recent Outpatient Visits          2 weeks ago Onycholysis   [facility] Grayce Sessions, NP   5 months ago Type 2 diabetes mellitus without complication, with long-term current use of insulin [facility])   [facility] [place], Jeannett Senior L, RPH-CPP   5 months ago Type 2 diabetes mellitus without complication, with long-term current use of insulin (HCC)   [facility] Grayce Sessions, NP   1 year ago Type 2 diabetes mellitus without complication, with long-term current use of insulin (HCC)   [facility] Grayce Sessions, NP   1 year ago Depression with anxiety   [facility] Grayce Sessions, NP      Future Appointments             In 1 week [name], Lisette Grinder, MD [facility]   In 2 months Randa Evens, Kinnie Scales, NP [facility]

## 2020-09-07 NOTE — Telephone Encounter (Signed)
Medication: Dulaglutide 1.5 MG/0.5ML SOPN [[id number]]   Has the patient contacted their pharmacy? YES  (Agent: If no, request that the patient contact the pharmacy for the refill.) (Agent: If yes, when and what did the pharmacy advise?)  Preferred Pharmacy (with phone number or street name): [facility] - [place], [place] - [place] [address] [place] [address] Phone: [phone] Fax: [phone] Hours: Not open 24 hours    Agent: Please be advised that RX refills may take up to 3 business days. We ask that you follow-up with your pharmacy.

## 2020-09-08 ENCOUNTER — Other Ambulatory Visit (HOSPITAL_COMMUNITY): Payer: Self-pay

## 2020-09-09 ENCOUNTER — Other Ambulatory Visit: Payer: Self-pay

## 2020-09-09 NOTE — Patient Outreach (Signed)
Patient no-showed appt today. Called Pharmacy and her Monitor was delivered. Will f/U 1 week to start trying to get sugars under control.

## 2020-09-11 DIAGNOSIS — Z419 Encounter for procedure for purposes other than remedying health state, unspecified: Secondary | ICD-10-CM | POA: Diagnosis not present

## 2020-09-14 ENCOUNTER — Other Ambulatory Visit: Payer: Self-pay

## 2020-09-14 ENCOUNTER — Ambulatory Visit (INDEPENDENT_AMBULATORY_CARE_PROVIDER_SITE_OTHER): Payer: Medicaid Other | Admitting: Infectious Disease

## 2020-09-14 ENCOUNTER — Telehealth: Payer: Self-pay

## 2020-09-14 ENCOUNTER — Encounter: Payer: Self-pay | Admitting: Infectious Disease

## 2020-09-14 VITALS — BP 114/78 | HR 103 | Resp 16 | Ht 71.0 in | Wt 223.0 lb

## 2020-09-14 DIAGNOSIS — I10 Essential (primary) hypertension: Secondary | ICD-10-CM | POA: Diagnosis not present

## 2020-09-14 DIAGNOSIS — R2689 Other abnormalities of gait and mobility: Secondary | ICD-10-CM | POA: Diagnosis not present

## 2020-09-14 DIAGNOSIS — B20 Human immunodeficiency virus [HIV] disease: Secondary | ICD-10-CM

## 2020-09-14 DIAGNOSIS — Z789 Other specified health status: Secondary | ICD-10-CM

## 2020-09-14 DIAGNOSIS — R7309 Other abnormal glucose: Secondary | ICD-10-CM | POA: Diagnosis not present

## 2020-09-14 HISTORY — DX: Other abnormalities of gait and mobility: R26.89

## 2020-09-14 NOTE — Progress Notes (Signed)
Subjective:  Chief complaint: Problems with loss of balance that happens for a few minutes at a time over the last 3 weeks  Patient ID: [name], adult    DOB: Dec 07, 1970, 50 y.o.   MRN: [id number]  HPI  Ask is a 50 year old Caucasian transgender male living with HIV that has been perfectly suppressed on Biktarvy.  She does suffer from comorbid obesity diabetes mellitus and prior smoking.  She did suffer a CVA in the past with an ischemic stroke.   She is currently taking spironolactone at a dose of 50 mg to help with transgender male to male transition.  She is interested in initiating estrogen therapy now that she has stopped smoking and there is less of a risk of a thromboembolic event.  He is also not immune to hepatitis based on vaccine so that is been given and based on surface antibodies have been tested.  He was indeed able to enroll into the ACG G hepatitis B vaccine study  Fortunate since I last saw her she has had worsening control of her diabetes mellitus.  Today she also reports symptoms of loss of balance that happen for a few minutes at a time and have been going on for 3 weeks.  When asked if there was some positional changes which would make me think that this might be an orthostatic issue she says she does no dizziness and there is no correlation with standing up from a seated position or lying position.  She does not have blurry vision she does not have nystagmus.     Past Medical History:  Diagnosis Date  . Asthma   . Depression   . Diabetes mellitus   . GERD (gastroesophageal reflux disease)   . HIV disease ([place])   . Hypertension   . 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 Perirectal abscess 03/30/2017  . Pneumonia   . Polyuria 04/27/2015  . Poorly controlled diabetes mellitus ([place]) 04/27/2015  . Testicular mass 06/20/2016  . Transgender 04/27/2015    Past Surgical History:  Procedure Laterality Date  . APPENDECTOMY    . TRANSFORAMINAL LUMBAR INTERBODY  FUSION (TLIF) WITH PEDICLE SCREW FIXATION 3 LEVEL N/A 01/09/2019   Procedure: TRANSFORAMINAL LUMBAR INTERBODY FUSION (TLIF) WITH PEDICLE SCREW FIXATION 3 LEVEL, POSTERIOR SPINAL FUSION;  Surgeon: [name], MD;  Location: [facility] ORS;  Service: Neurosurgery;  Laterality: N/A;    Family History  Problem Relation Age of Onset  . Heart failure Father   . Diabetes Father       Social History   Socioeconomic History  . Marital status: Single    Spouse name: Not on file  . Number of children: Not on file  . Years of education: Not on file  . Highest education level: Not on file  Occupational History  . Not on file  Tobacco Use  . Smoking status: Former Smoker    Packs/day: 0.10    Years: 27.00    Pack years: 2.70    Types: Cigarettes    Quit date: 07/19/2018    Years since quitting: 2.1  . Smokeless tobacco: Never Used  . Tobacco comment: started back yesterday  Vaping Use  . Vaping Use: Never used  Substance and Sexual Activity  . Alcohol use: No    Alcohol/week: 0.0 standard drinks  . Drug use: Yes    Frequency: 7.0 times per week    Types: Marijuana  . Sexual activity: Yes    Partners: Male  Comment: pt. given condoms  Other Topics Concern  . Not on file  Social History Narrative  . Not on file   Social Determinants of Health   Financial Resource Strain: Not on file  Food Insecurity: Not on file  Transportation Needs: Not on file  Physical Activity: Not on file  Stress: Not on file  Social Connections: Not on file    Allergies  Allergen Reactions  . Propoxyphene N-Acetaminophen Other (See Comments)    Stomach cramps     Current Outpatient Medications:  .  Accu-Chek FastClix Lancets MISC, USE TO TEST BLOOD SUGAR UP TO 4 TIMES DAILY AS DIRECTED, Disp: 102 each, Rfl: 0 .  ACCU-CHEK SOFTCLIX LANCETS lancets, 1 each by Other route 3 (three) times daily., Disp: 100 each, Rfl: 5 .  bictegravir-emtricitabine-tenofovir AF (BIKTARVY) 50-200-25 MG TABS  tablet, TAKE 1 TABLET BY MOUTH DAILY., Disp: 30 tablet, Rfl: 0 .  blood glucose meter kit and supplies, Dispense based on patient and insurance preference. Use up to four times daily as directed. (FOR ICD-10 E10.9, E11.9)., Disp: 1 each, Rfl: 0 .  Blood Glucose Monitoring Suppl (ACCU-CHEK GUIDE ME) w/Device KIT, USE TO CHECK BLOOD SUGAR UP TO 4 TIMES DAILY AS DIRECTED, Disp: 1 kit, Rfl: 0 .  celecoxib (CELEBREX) 100 MG capsule, TAKE 1 CAPSULE BY MOUTH TWICE DAILY, Disp: 60 capsule, Rfl: 0 .  celecoxib (CELEBREX) 200 MG capsule, Take 200 mg by mouth 2 (two) times daily., Disp: , Rfl:  .  Dulaglutide 1.5 MG/0.5ML SOPN, INJECT 1.5 MG INTO THE SKIN ONCE A WEEK., Disp: 15 mL, Rfl: 1 .  escitalopram (LEXAPRO) 20 MG tablet, TAKE 1 TABLET BY MOUTH AT BEDTIME, Disp: 90 tablet, Rfl: 1 .  estradiol (ESTRACE) 2 MG tablet, Take 1 tablet (2 mg total) by mouth daily., Disp: 30 tablet, Rfl: 11 .  fenofibrate (TRICOR) 48 MG tablet, TAKE 1 TABLET BY MOUTH DAILY, Disp: 90 tablet, Rfl: 1 .  gabapentin (NEURONTIN) 300 MG capsule, TAKE 2 CAPSULES BY MOUTH EVERY MORNING AND 1 CAPSULE DAILY WITH LUNCH AND 2 CAPSULES NIGHTLY., Disp: 150 capsule, Rfl: 2 .  glipiZIDE (GLUCOTROL) 10 MG tablet, TAKE 1 TABLET BY MOUTH 2 TIMES DAILY BEFORE MEALS, Disp: 60 tablet, Rfl: 3 .  glucose blood test strip, USE TO TEST BLOOD SUGAR UP TO 4 TIMES DAILY AS DIRECTED (Patient taking differently: USE TO TEST BLOOD SUGAR UP TO 4 TIMES DAILY AS DIRECTED), Disp: 100 strip, Rfl: 0 .  metFORMIN (GLUCOPHAGE) 1000 MG tablet, Take 1 tablet (1,000 mg total) by mouth 2 (two) times daily with a meal., Disp: 180 tablet, Rfl: 1 .  methocarbamol (ROBAXIN) 500 MG tablet, TAKE 1 TABLET BY MOUTH 4 TIMES DAILY AS NEEDED FOR MUSCLE SPASMS, Disp: 120 tablet, Rfl: 0 .  pantoprazole (PROTONIX) 40 MG tablet, TAKE 1 TABLET (40 MG TOTAL) BY MOUTH DAILY., Disp: 90 tablet, Rfl: 1 .  PROAIR HFA 108 (90 Base) MCG/ACT inhaler, INHALE 2 PUFFS BY MOUTH INTO LUNGS EVERY 6 HOURS  AS NEEDED FOR WHEEZING OR SHORTNESS OF BREATH, Disp: 8.5 g, Rfl: 1 .  spironolactone (ALDACTONE) 100 MG tablet, Take 1 tablet (100 mg total) by mouth daily., Disp: 30 tablet, Rfl: 11 .  tiZANidine (ZANAFLEX) 2 MG tablet, TAKE 1 TABLET BY MOUTH 3 TIMES DAILY AS NEEDED FOR MUSCLE SPASMS, Disp: 90 tablet, Rfl: 2   Review of Systems  Constitutional: Negative for chills and fever.  HENT: Negative for congestion and sore throat.   Eyes: Negative for photophobia.  Respiratory: Negative for cough, shortness of breath and wheezing.   Cardiovascular: Negative for chest pain, palpitations and leg swelling.  Gastrointestinal: Negative for abdominal pain, blood in stool, constipation, diarrhea, nausea and vomiting.  Genitourinary: Negative for dysuria, flank pain and hematuria.  Musculoskeletal: Negative for back pain and myalgias.  Skin: Negative for rash.  Neurological: Negative for dizziness, weakness and headaches.  Hematological: Does not bruise/bleed easily.  Psychiatric/Behavioral: Negative for agitation, behavioral problems, confusion, decreased concentration, dysphoric mood, self-injury and suicidal ideas. The patient is not nervous/anxious.        Objective:   Physical Exam Constitutional:      General: She is not in acute distress.    Appearance: She is well-developed. She is not diaphoretic.  HENT:     Head: Normocephalic and atraumatic.     Mouth/Throat:     Pharynx: No oropharyngeal exudate.  Eyes:     General: No scleral icterus.    Conjunctiva/sclera: Conjunctivae normal.  Cardiovascular:     Rate and Rhythm: Normal rate and regular rhythm.     Heart sounds: Normal heart sounds. No murmur heard. No friction rub. No gallop.   Pulmonary:     Effort: Pulmonary effort is normal. No respiratory distress.     Breath sounds: No stridor. No wheezing or rhonchi.  Abdominal:     General: There is no distension.  Musculoskeletal:        General: No tenderness. Normal range of  motion.     Cervical back: Normal range of motion and neck supple.  Skin:    General: Skin is warm and dry.     Coloration: Skin is not pale.     Findings: No erythema or rash.  Neurological:     General: No focal deficit present.     Mental Status: She is alert and oriented to person, place, and time.     Motor: No abnormal muscle tone.     Coordination: Coordination normal.  Psychiatric:        Mood and Affect: Mood normal.        Behavior: Behavior normal.        Thought Content: Thought content normal.        Judgment: Judgment normal.    [name] had an episode of difficulty with her balance in the room with me as she was getting ready leave the clinic she also had another one prior to coming into the exam room.       Assessment & Plan:   Problem with 3 weeks of symptoms of loss of balance.  I am a bit puzzled by this and initially put in referral for physical therapy in cases of vestibular issue and also consult to neurology.  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

## 2020-09-14 NOTE — Telephone Encounter (Signed)
Received call from [facility], they need additional information for physical therapy referral. They will fax paperwork that needs to be signed to triage.   Sandie Ano, RN

## 2020-09-14 NOTE — Telephone Encounter (Signed)
RN faxed signed physical therapy order referral to [facility] at [phone]. Received receipt of successful fax transmission. Copy in triage.   Sandie Ano, RN

## 2020-09-15 ENCOUNTER — Encounter: Payer: Self-pay | Admitting: Neurology

## 2020-09-15 ENCOUNTER — Ambulatory Visit: Payer: Medicaid Other | Admitting: Neurology

## 2020-09-15 VITALS — BP 131/82 | HR 104 | Ht 71.0 in | Wt 219.5 lb

## 2020-09-15 DIAGNOSIS — R269 Unspecified abnormalities of gait and mobility: Secondary | ICD-10-CM

## 2020-09-15 DIAGNOSIS — M21371 Foot drop, right foot: Secondary | ICD-10-CM | POA: Insufficient documentation

## 2020-09-15 DIAGNOSIS — E1142 Type 2 diabetes mellitus with diabetic polyneuropathy: Secondary | ICD-10-CM | POA: Diagnosis not present
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
## 2020-09-16 ENCOUNTER — Other Ambulatory Visit: Payer: Self-pay

## 2020-09-16 ENCOUNTER — Telehealth: Payer: Self-pay | Admitting: Neurology

## 2020-09-16 NOTE — Telephone Encounter (Signed)
[facility] pending faxed notes

## 2020-09-18 ENCOUNTER — Other Ambulatory Visit: Payer: Self-pay | Admitting: *Deleted

## 2020-09-18 NOTE — Patient Outreach (Signed)
Care Coordination  09/18/2020  CELVIN TANEY November 21, 1970 [id number]    Medicaid Managed Care   Unsuccessful Outreach Note  09/18/2020 Name: [name] MRN: [id number] DOB: May 10, 1971  Referred by: Grayce Sessions, NP Reason for referral : High Risk Managed Medicaid (Unsuccessful RNCM follow up outreach)   An unsuccessful telephone outreach was attempted today. The patient was referred to the case management team for assistance with care management and care coordination.   Follow Up Plan: A HIPAA compliant phone message was left for the patient providing contact information and requesting a return call.  The care management team will reach out to the patient again over the next 7-14 days.   Estanislado Emms RN, BSN [place]  [facility]

## 2020-09-18 NOTE — Patient Instructions (Signed)
Visit Information  [name]  - as a part of your Medicaid benefit, you are eligible for care management and care coordination services at no cost or copay. I was unable to reach you by phone today but would be happy to help you with your health related needs. Please feel free to call me @ [phone].   A member of the Managed Medicaid care management team will reach out to you again over the next 7-14 days.   Estanislado Emms RN, BSN [place]  [facility] [employer]

## 2020-09-21 NOTE — Telephone Encounter (Signed)
[facility] auth: [id number] (exp. 09/16/20 to 11/15/20) order faxed to [facility] they will reach out to the pt to schedule.

## 2020-09-23 ENCOUNTER — Other Ambulatory Visit: Payer: Self-pay

## 2020-09-23 NOTE — Patient Outreach (Signed)
Medicaid Managed Care    Pharmacy Note  09/23/2020 Name: [name] MRN: [id number] DOB: 1971-03-19  [name] is a 50 y.o. year old adult who is a primary care patient of [name], NP. [facility] Managed Care Coordination team was consulted for assistance with disease management and care coordination needs.    Engaged with patient Engaged with patient by telephone for follow up visit in response to referral for case management and/or care coordination services.  [name] was given information about Managed Medicaid Care Coordination team services today. [name] agreed to services and verbal consent obtained.   Objective:  Lab Results  Component Value Date   CREATININE 0.82 04/13/2020   CREATININE 0.76 09/18/2019   CREATININE 0.92 08/27/2019    Lab Results  Component Value Date   HGBA1C 10.1 (A) 08/18/2020       Component Value Date/Time   CHOL 134 04/13/2020 0910   CHOL 134 03/23/2020 1422   TRIG 313 (H) 04/13/2020 0910   HDL 25 (L) 04/13/2020 0910   HDL 30 (L) 03/23/2020 1422   CHOLHDL 5.4 (H) 04/13/2020 0910   VLDL 47 (H) 11/01/2017 0334   LDLCALC 71 04/13/2020 0910    Other: (TSH, CBC, Vit D, etc.)  Clinical ASCVD: Yes  The ASCVD Risk score [name], et al., 2013) failed to calculate for the following reasons:   The patient has a prior MI or stroke diagnosis    Other: (CHADS2VASc if Afib, PHQ9 if depression, MMRC or CAT for COPD, ACT, DEXA)  BP Readings from Last 3 Encounters:  09/15/20 131/82  09/14/20 114/78  08/18/20 137/86    Assessment/Interventions: Review of patient past medical history, allergies, medications, health status, including review of consultants reports, laboratory and other test data, was performed as part of comprehensive evaluation and provision of chronic care management services.   HIV Biktarvy Plan: At goal,  patient stable/ symptoms controlled   HTN -Doesn't test Spironolactone 167m Plan:  At goal,  patient stable/ symptoms controlled   DM Home Testing: Got new meter in late March but has only tested 4x. Was in 190's last time she tested and remembered -Trulicity -Metformin Feb 2022 Plan: Will get new meter, then start calling monthly to improve sugars in March March 2022: No meter sent by PCP yet, called again and sent msg to get ASAP. Will f/u 1 week April 2022: Sugars are too high and patient seeing DM specialist in 2 weeks. Asked [name] Billowto test daily or every other day and to write down values and bring into specialist. Will f/u 1 month  HRT -Estradiol -Spironolactone 1058mPlan: At goal,  patient stable/ symptoms controlled   Mood -Thinks needs higher dose Escitalopram (Mental Health Plan: Will ask PCP to try SNRI to help with pain and mood  Pain Nerve pain, major back Sx a year -Patient didn't give a pain scale despite multiple inquiries. Simply states, "Back spasms and tightens up" but she's content on therapy at the moment Gabapentin Tizanidine Celecoxib 10017mID Plan: At goal,  patient stable/ symptoms controlled  Meds: -[facility]   SDOH (Social Determinants of Health) assessments and interventions performed:    Care Plan  Allergies  Allergen Reactions  . Propoxyphene N-Acetaminophen Other (See Comments)    Stomach cramps    Medications Reviewed Today    Reviewed by KirDesmond LopeN (Registered Nurse) on 09/15/20 at 1253  Med List Status: <None>  Medication  Order Taking? Sig Documenting Provider Last Dose Status Informant  Accu-Chek FastClix Lancets MISC [id number] Yes USE TO TEST BLOOD SUGAR UP TO 4 TIMES DAILY AS DIRECTED [name], NP Taking Active   ACCU-CHEK SOFTCLIX LANCETS lancets [id number] Yes 1 each by Other route 3 (three) times daily. Clent Demark, PA-C Taking Active            Med Note [facility], Damien Fusi   Tue Sep 15, 2020 12:52 PM)    bictegravir-emtricitabine-tenofovir AF  (BIKTARVY) 50-200-25 MG TABS tablet [id number] Yes TAKE 1 TABLET BY MOUTH DAILY. [name], [name], MD Taking Active   blood glucose meter kit and supplies [id number] Yes Dispense based on patient and insurance preference. Use up to four times daily as directed. (FOR ICD-10 E10.9, E11.9). [name], NP Taking Active   Blood Glucose Monitoring Suppl (ACCU-CHEK GUIDE ME) w/Device KIT [id number] Yes USE TO CHECK BLOOD SUGAR UP TO 4 TIMES DAILY AS DIRECTED [name], NP Taking Active   celecoxib (CELEBREX) 200 MG capsule [id number] Yes Take 200 mg by mouth 2 (two) times daily. [provider] Taking Active   Dulaglutide 1.5 MG/0.5ML SOPN [id number] Yes INJECT 1.5 MG INTO THE SKIN ONCE A WEEK. [name], NP Taking Active   escitalopram (LEXAPRO) 20 MG tablet [id number] Yes TAKE 1 TABLET BY MOUTH AT BEDTIME [name], NP Taking Active   estradiol (ESTRACE) 2 MG tablet [id number] Yes Take 1 tablet (2 mg total) by mouth daily. [name], [name], MD Taking Active   fenofibrate (TRICOR) 48 MG tablet [id number] Yes TAKE 1 TABLET BY MOUTH DAILY [name], NP Taking Active   gabapentin (NEURONTIN) 300 MG capsule [id number] Yes TAKE 2 CAPSULES BY MOUTH EVERY MORNING AND 1 CAPSULE DAILY WITH LUNCH AND 2 CAPSULES NIGHTLY. [name], MD Taking Active   glipiZIDE (GLUCOTROL) 10 MG tablet [id number] Yes TAKE 1 TABLET BY MOUTH 2 TIMES DAILY BEFORE MEALS [name], NP Taking Active   glucose blood test strip [id number] Yes USE TO TEST BLOOD SUGAR UP TO 4 TIMES DAILY AS DIRECTED  Patient taking differently: USE TO TEST BLOOD SUGAR UP TO 4 TIMES DAILY AS DIRECTED   [name], NP Taking Active   metFORMIN (GLUCOPHAGE) 1000 MG tablet [id number] Yes Take 1 tablet (1,000 mg total) by mouth 2 (two) times daily with a meal. [name], NP Taking Active   pantoprazole (PROTONIX) 40 MG tablet [id number] Yes TAKE 1 TABLET (40 MG TOTAL) BY MOUTH  DAILY. [name], NP Taking Active   PROAIR HFA [phone] Base) MCG/ACT inhaler [id number] Yes INHALE 2 PUFFS BY MOUTH INTO LUNGS EVERY 6 HOURS AS NEEDED FOR WHEEZING OR SHORTNESS OF [name], NP Taking Active   spironolactone (ALDACTONE) 100 MG tablet [id number] Yes Take 1 tablet (100 mg total) by mouth daily. [name], [name], MD Taking Active   tiZANidine (ZANAFLEX) 2 MG tablet [id number] Yes TAKE 1 TABLET BY MOUTH 3 TIMES DAILY AS NEEDED FOR MUSCLE SPASMS [name], MD Taking Active           Patient Active Problem List   Diagnosis Date Noted  . Right foot drop 09/15/2020  . Gait abnormality 09/15/2020  . DM type 2 with diabetic peripheral neuropathy ([place]) 09/15/2020  . Loss of balance 09/14/2020  . S/P lumbar fusion 01/09/2019  . Medication monitoring encounter 09/12/2018  . Ischemic stroke ([place]) 10/31/2017  . Visual field defect   .  Complicated migraine   . Migraine with visual aura   . Essential hypertension 06/01/2016  . Diabetes mellitus type 2 in obese ([place]) 06/01/2016  . Poorly controlled diabetes mellitus ([place]) 04/27/2015  . Hearing loss secondary to cerumen impaction 02/07/2013  . Smoker 03/07/2011  . Asthma 02/07/2008  . Male-to-male transgender person 10/31/2007  . Depression with anxiety 10/17/2007  . Human immunodeficiency virus (HIV) disease ([place]) 04/14/2006  . HIDRADENITIS SUPPURATIVA 04/14/2006    Conditions to be addressed/monitored: HTN, DM and Pain   Care Plan : Medication Management  Updates made by Lane Hacker, [place] since 08/05/2020 12:00 AM    Problem: Health Promotion or Disease Self-Management (General Plan of Care)     Goal: Medication Management   Note:   Current Barriers:  . Unable to independently monitor therapeutic efficacy . Unable to achieve control of DM  . Does not maintain contact with provider office . Does not contact provider office for questions/concerns . Lost monitor in move (Changed  apartments in October 2021) and hasn't checked sugars since  Pharmacist Clinical Goal(s):  Marland Kitchen Over the next 30 days, patient will contact provider office for questions/concerns as evidenced notation of same in electronic health record through collaboration with PharmD and provider.  .   Interventions: . Inter-disciplinary care team collaboration (see longitudinal plan of care) . Comprehensive medication review performed; medication list updated in electronic medical record  '@RXCPDIABETES' @ '@RXCPMENTALHEALTH' @ '@RXCPOSTEOPOROSIS' @  Patient Goals/Self-Care Activities . Over the next 30 days, patient will:  - take medications as prescribed check glucose Daily, document, and provide at future appointments collaborate with provider on medication access solutions  Follow Up Plan: The care management team will reach out to the patient again over the next 30 days.     Task: Mutually Develop and Royce Macadamia Achievement of Patient Goals   Note:   Care Management Activities:    - verbalization of feelings encouraged    Notes:      Medication Assistance: None required. Patient affirms current coverage meets needs.   Follow up: Agree  Plan: The care management team will reach out to the patient again over the next 30 days.   [place] Constable, Pharm.D., Managed Medicaid Pharmacist - [phone]

## 2020-09-23 NOTE — Telephone Encounter (Signed)
schedule for 4/16 11am [place] location
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
## 2020-09-28 ENCOUNTER — Telehealth: Payer: Self-pay | Admitting: Primary Care

## 2020-09-28 NOTE — Telephone Encounter (Signed)
Attempted to reach [name] today to get her rescheduled with the [facility] RNCM for a phone visit. I left my name and number on her  VM.

## 2020-10-01 ENCOUNTER — Other Ambulatory Visit (HOSPITAL_COMMUNITY): Payer: Self-pay

## 2020-10-02 ENCOUNTER — Other Ambulatory Visit: Payer: Self-pay

## 2020-10-02 NOTE — Patient Outreach (Signed)
Care Coordination  10/02/2020  AYDIAN DIMMICK 21-Jul-1970 [id number]   Medicaid Managed Care   Unsuccessful Outreach Note  10/02/2020 Name: KENDYN ZAMAN MRN: [id number] DOB: 1970-08-01  Referred by: Grayce Sessions, NP Reason for referral : High Risk Managed Medicaid ([facility] Social Work Unsuccessful [employer])   An unsuccessful telephone outreach was attempted today. The patient was referred to the case management team for assistance with care management and care coordination.   Follow Up Plan: The patient has been provided with contact information for the care management team and has been advised to call with any health related questions or concerns.   Gus Puma, BSW, [facility]  [place]  High Risk Managed Medicaid Team

## 2020-10-02 NOTE — Patient Instructions (Signed)
Visit Information  [name]  - as a part of your Medicaid benefit, you are eligible for care management and care coordination services at no cost or copay. I was unable to reach you by phone today but would be happy to help you with your health related needs. Please feel free to call me @ [phone]     Gus Puma, BSW, [facility]  [place]  High Risk Managed Medicaid Team

## 2020-10-05 ENCOUNTER — Encounter: Payer: Medicaid Other | Admitting: Neurology

## 2020-10-05 ENCOUNTER — Other Ambulatory Visit (HOSPITAL_COMMUNITY): Payer: Self-pay

## 2020-10-05 ENCOUNTER — Encounter: Payer: Self-pay | Admitting: Neurology

## 2020-10-05 ENCOUNTER — Telehealth: Payer: Self-pay | Admitting: *Deleted

## 2020-10-05 NOTE — Telephone Encounter (Signed)
No showed NCV/EMG appointment. 

## 2020-10-07 ENCOUNTER — Other Ambulatory Visit (HOSPITAL_COMMUNITY): Payer: Self-pay

## 2020-10-08 ENCOUNTER — Ambulatory Visit: Payer: Medicaid Other | Admitting: Dietician

## 2020-10-11 DIAGNOSIS — Z419 Encounter for procedure for purposes other than remedying health state, unspecified: Secondary | ICD-10-CM | POA: Diagnosis not present

## 2020-10-22 ENCOUNTER — Other Ambulatory Visit: Payer: Self-pay

## 2020-10-30 ENCOUNTER — Other Ambulatory Visit: Payer: Self-pay

## 2020-11-03 ENCOUNTER — Other Ambulatory Visit: Payer: Self-pay

## 2020-11-03 MED FILL — Dulaglutide Soln Auto-injector 1.5 MG/0.5ML: SUBCUTANEOUS | 28 days supply | Qty: 2 | Fill #0 | Status: AC

## 2020-11-11 DIAGNOSIS — Z419 Encounter for procedure for purposes other than remedying health state, unspecified: Secondary | ICD-10-CM | POA: Diagnosis not present

## 2020-11-18 ENCOUNTER — Other Ambulatory Visit: Payer: Self-pay

## 2020-11-18 ENCOUNTER — Ambulatory Visit (INDEPENDENT_AMBULATORY_CARE_PROVIDER_SITE_OTHER): Payer: Self-pay | Admitting: *Deleted

## 2020-11-18 ENCOUNTER — Other Ambulatory Visit: Payer: Medicaid Other | Admitting: *Deleted
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
## 2020-11-18 NOTE — Patient Outreach (Signed)
Care Coordination  11/18/2020  [name] 1970-12-07 [id number]   RNCM returning call to patient requesting contact information for medical transportation. Number given for medical transportation provided by [facility] [phone]. Explained to have the address of office and appointment time available when calling. Pt voiced understanding. While RNCM had patient on the phone a follow up outreach was scheduled for 11/24/20 @ 1pm. Patient agreed to this date and time.   [name] Emms RN, BSN [place]  [employer]

## 2020-11-18 NOTE — Telephone Encounter (Signed)
Pt reports sore throat and mildly swollen tonsils 1 1/2 months ago. States took antibiotics he had for previous sore throat. States sore throat resolved, tonsils remained swollen. States worsening past week. "Now with less than 1cm between the two at the back of my throat." No redness, no white patches. No new meds.  Denies any pain, lymph nodes are not swollen or tender to touch. No dental pain, no fever. States hard to eat, swallow now. Directed to UC, states will follow disposition.     Reason for Disposition . [1] White patches that stick to tongue or inner cheek AND [2] can be wiped off    Tonsils swollen, no pain  Answer Assessment - Initial Assessment Questions 1. SYMPTOM: "What's the main symptom you're concerned about?" (e.g., chapped lips, dry mouth, lump, sores)     Tonsils swollen 2. ONSET: "When did the  *No Answer*  start?"     Weeks ago, worsening 3. PAIN: "Is there any pain?" If Yes, ask: "How bad is it?" (Scale: 1-10; mild, moderate, severe)   - MILD (1-3):  doesn't interfere with eating or normal activities   - MODERATE (4-7): interferes with eating some solids and normal activities   - SEVERE (8-10):  excruciating pain, interferes with most normal activities   - SEVERE DYSPHAGIA: can't swallow liquids, drooling     None 4. CAUSE: "What do you think is causing the symptoms?"     Unsure 5. OTHER SYMPTOMS: "Do you have any other symptoms?" (e.g., fever, sore throat, toothache, swelling)     Just the swelling  Protocols used: MOUTH [facility]

## 2020-11-18 NOTE — Patient Outreach (Signed)
Medicaid Managed Care    Pharmacy Note  11/18/2020 Name: [name] MRN: [id number] DOB: [birth year]  [name] is a 50 y.o. year old adult who is a primary care patient of [name], NP. The [facility] Managed Care Coordination team was consulted for assistance with disease management and care coordination needs.    Engaged with patient Engaged with patient by telephone for follow up visit in response to referral for case management and/or care coordination services.  [name] was given information about Managed Medicaid Care Coordination team services today. [name] agreed to services and verbal consent obtained.   Objective:  Lab Results  Component Value Date   CREATININE 0.82 04/13/2020   CREATININE 0.76 09/18/2019   CREATININE 0.92 08/27/2019    Lab Results  Component Value Date   HGBA1C 10.1 (A) 08/18/2020       Component Value Date/Time   CHOL 134 04/13/2020 0910   CHOL 134 03/23/2020 1422   TRIG 313 (H) 04/13/2020 0910   HDL 25 (L) 04/13/2020 0910   HDL 30 (L) 03/23/2020 1422   CHOLHDL 5.4 (H) 04/13/2020 0910   VLDL 47 (H) 11/01/2017 0334   LDLCALC 71 04/13/2020 0910    Other: (TSH, CBC, Vit D, etc.)  Clinical ASCVD: Yes  The ASCVD Risk score [name], et al., 2013) failed to calculate for the following reasons:   The patient has a prior MI or stroke diagnosis    Other: (CHADS2VASc if Afib, PHQ9 if depression, MMRC or CAT for COPD, ACT, DEXA)  BP Readings from Last 3 Encounters:  09/15/20 131/82  09/14/20 114/78  08/18/20 137/86    Assessment/Interventions: Review of patient past medical history, allergies, medications, health status, including review of consultants reports, laboratory and other test data, was performed as part of comprehensive evaluation and provision of chronic care management services.   HIV [place] Plan: At goal,  patient stable/ symptoms controlled   HTN -Doesn't test Spironolactone 137m Plan: At  goal,  patient stable/ symptoms controlled   DM Lab Results  Component Value Date   HGBA1C 10.1 (A) 08/18/2020   HGBA1C 9.8 (A) 03/23/2020   HGBA1C 8.8 (H) 08/27/2019   Lab Results  Component Value Date   MICROALBUR 2.50 (H) 07/20/2011   LDLCALC 71 04/13/2020   CREATININE 0.82 04/13/2020    Lab Results  Component Value Date   NA 133 (L) 04/13/2020   K 4.1 04/13/2020   CREATININE 0.82 04/13/2020   GFRNONAA >60 11/23/2018   GFRAA >60 11/23/2018   GLUCOSE 297 (H) 04/13/2020   Home Testing: Got new meter in late March but has only tested 4x. Was in 190's last time she tested and remembered -Trulicity -Metformin Feb 2022 Plan: Will get new meter, then start calling monthly to improve sugars in March March 2022: No meter sent by PCP yet, called again and sent msg to get ASAP. Will f/u 1 week April 2022: Sugars are too high and patient seeing DM specialist in 2 weeks. Asked [name] Billowto test daily or every other day and to write down values and bring into specialist. Will f/u 1 month June 2022: Hasn't tested past 3 days Tested in the AM after she eats by 1 hour 6/3: 157 6/2: 143 6/1: 163 5/29: 167 5/27: 169  Plan: Test before not after we eat. Patient has f/u appt tomorrow for labs but stated she can't make it due to lack of transportation. She missed both appts with [name] Glassingand  [name] and never called back. Gave patient phone numbers so she can reschedule and hopefully get help with transportation   HRT -Estradiol -Spironolactone 175m Plan: At goal,  patient stable/ symptoms controlled   Mood -Thinks needs higher dose Escitalopram (Mental Health Plan: If I can get patient transportation she can be assessed in person tomorrow with PCP  Pain Nerve pain, major back Sx a year -Patient didn't give a pain scale despite multiple inquiries. Simply states, "Back spasms and tightens up" but she's content on therapy at the moment Gabapentin Tizanidine Celecoxib 1029mBID Plan:  At goal,  patient stable/ symptoms controlled  Meds: -[facility] Rx's   SDOH (Social Determinants of Health) assessments and interventions performed:    Care Plan  Allergies  Allergen Reactions  . Propoxyphene N-Acetaminophen Other (See Comments)    Stomach cramps    Medications Reviewed Today    Reviewed by KiDesmond LopeRN (Registered Nurse) on 09/15/20 at 1253  Med List Status: <None>  Medication Order Taking? Sig Documenting Provider Last Dose Status Informant  Accu-Chek FastClix Lancets MISC [id number] USE TO TEST BLOOD SUGAR UP TO 4 TIMES DAILY AS DIRECTED EdKerin PernaNP Taking Active   ACCU-CHEK SOFTCLIX LANCETS lancets [id number] 1 each by Other route 3 (three) times daily. GoClent DemarkPA-C Taking Active            Med Note ([facility] Fusi Tue Sep 15, 2020 12:52 PM)    bictegravir-emtricitabine-tenofovir AF (BIKTARVY) 50-200-25 MG TABS tablet [id number] TAKE 1 TABLET BY MOUTH DAILY. VaTommy MedalCoLavell IslamMD Taking Active   blood glucose meter kit and supplies [id number] Dispense based on patient and insurance preference. Use up to four times daily as directed. (FOR ICD-10 E10.9, E11.9). EdKerin PernaNP Taking Active   Blood Glucose Monitoring Suppl (ACCU-CHEK GUIDE ME) w/Device KIT [id number] USE TO CHECK BLOOD SUGAR UP TO 4 TIMES DAILY AS DIRECTED EdKerin PernaNP Taking Active   celecoxib (CELEBREX) 200 MG capsule [id number] Take 200 mg by mouth 2 (two) times daily. [provider] Taking Active   Dulaglutide 1.5 MG/0.5ML SOPN [id number] INJECT 1.5 MG INTO THE SKIN ONCE A WEEK. EdKerin PernaNP Taking Active   escitalopram (LEXAPRO) 20 MG tablet [id number] TAKE 1 TABLET BY MOUTH AT BEDTIME EdKerin PernaNP Taking Active   estradiol (ESTRACE) 2 MG tablet [id number] Take 1 tablet (2 mg total) by mouth daily. VaTommy MedalCoLavell IslamMD Taking Active   fenofibrate  (TRICOR) 48 MG tablet [id number] TAKE 1 TABLET BY MOUTH DAILY EdKerin PernaNP Taking Active   gabapentin (NEURONTIN) 300 MG capsule [id number] TAKE 2 CAPSULES BY MOUTH EVERY MORNING AND 1 CAPSULE DAILY WITH LUNCH AND 2 CAPSULES NIGHTLY. YaMeade MawMD Taking Active   glipiZIDE (GLUCOTROL) 10 MG tablet [id number] TAKE 1 TABLET BY MOUTH 2 TIMES DAILY BEFORE MEALS EdKerin PernaNP Taking Active   glucose blood test strip [id number] USE TO TEST BLOOD SUGAR UP TO 4 TIMES DAILY AS DIRECTED  Patient taking differently: USE TO TEST BLOOD SUGAR UP TO 4 TIMES DAILY AS DIRECTED   EdKerin PernaNP Taking Active   metFORMIN (GLUCOPHAGE) 1000 MG tablet [id number] Take 1 tablet (1,000 mg total) by mouth 2 (two) times daily with a meal. EdKerin PernaNP Taking Active   pantoprazole (PROTONIX) 40 MG tablet [id number]  Yes TAKE 1 TABLET (40 MG TOTAL) BY MOUTH DAILY. [name], NP Taking Active   PROAIR HFA [phone] Base) MCG/ACT inhaler [id number] Yes INHALE 2 PUFFS BY MOUTH INTO LUNGS EVERY 6 HOURS AS NEEDED FOR WHEEZING OR SHORTNESS OF [name], NP Taking Active   spironolactone (ALDACTONE) 100 MG tablet [id number] Yes Take 1 tablet (100 mg total) by mouth daily. [name], [name], MD Taking Active   tiZANidine (ZANAFLEX) 2 MG tablet [id number] Yes TAKE 1 TABLET BY MOUTH 3 TIMES DAILY AS NEEDED FOR MUSCLE SPASMS [name], MD Taking Active           Patient Active Problem List   Diagnosis Date Noted  . Right foot drop 09/15/2020  . Gait abnormality 09/15/2020  . DM type 2 with diabetic peripheral neuropathy ([place]) 09/15/2020  . Loss of balance 09/14/2020  . S/P lumbar fusion 01/09/2019  . Medication monitoring encounter 09/12/2018  . Ischemic stroke ([place]) 10/31/2017  . Visual field defect   . Complicated migraine   . Migraine with visual aura   . Essential hypertension 06/01/2016  . Diabetes mellitus type 2 in obese  ([place]) 06/01/2016  . Poorly controlled diabetes mellitus ([place]) 04/27/2015  . Hearing loss secondary to cerumen impaction 02/07/2013  . Smoker 03/07/2011  . Asthma 02/07/2008  . Male-to-male transgender person 10/31/2007  . Depression with anxiety 10/17/2007  . Human immunodeficiency virus (HIV) disease ([place]) 04/14/2006  . HIDRADENITIS SUPPURATIVA 04/14/2006    Conditions to be addressed/monitored: HTN, DM and Pain   Care Plan : Medication Management  Updates made by Lane Hacker, [place] since 08/05/2020 12:00 AM    Problem: Health Promotion or Disease Self-Management (General Plan of Care)     Goal: Medication Management   Note:   Current Barriers:  . Unable to independently monitor therapeutic efficacy . Unable to achieve control of DM  . Does not maintain contact with provider office . Does not contact provider office for questions/concerns . Lost monitor in move (Changed apartments in October 2021) and hasn't checked sugars since  Pharmacist Clinical Goal(s):  Marland Kitchen Over the next 30 days, patient will contact provider office for questions/concerns as evidenced notation of same in electronic health record through collaboration with PharmD and provider.  .   Interventions: . Inter-disciplinary care team collaboration (see longitudinal plan of care) . Comprehensive medication review performed; medication list updated in electronic medical record  '@RXCPDIABETES' @ '@RXCPMENTALHEALTH' @ '@RXCPOSTEOPOROSIS' @  Patient Goals/Self-Care Activities . Over the next 30 days, patient will:  - take medications as prescribed check glucose Daily, document, and provide at future appointments collaborate with provider on medication access solutions  Follow Up Plan: The care management team will reach out to the patient again over the next 30 days.     Task: Mutually Develop and Royce Macadamia Achievement of Patient Goals   Note:   Care Management Activities:    - verbalization of feelings  encouraged    Notes:      Medication Assistance: None required. Patient affirms current coverage meets needs.   Follow up: Agree  Plan: The care management team will reach out to the patient again over the next 30 days.   [place] Constable, Pharm.D., Managed Medicaid Pharmacist - [phone]

## 2020-11-19 ENCOUNTER — Ambulatory Visit (INDEPENDENT_AMBULATORY_CARE_PROVIDER_SITE_OTHER): Payer: Medicaid Other | Admitting: Primary Care

## 2020-11-20 DIAGNOSIS — J208 Acute bronchitis due to other specified organisms: Secondary | ICD-10-CM | POA: Diagnosis not present

## 2020-11-20 DIAGNOSIS — J039 Acute tonsillitis, unspecified: Secondary | ICD-10-CM | POA: Diagnosis not present

## 2020-11-20 DIAGNOSIS — J45901 Unspecified asthma with (acute) exacerbation: Secondary | ICD-10-CM | POA: Diagnosis not present

## 2020-11-20 DIAGNOSIS — A499 Bacterial infection, unspecified: Secondary | ICD-10-CM | POA: Diagnosis not present

## 2020-11-24 ENCOUNTER — Other Ambulatory Visit: Payer: Self-pay | Admitting: *Deleted

## 2020-11-24 ENCOUNTER — Other Ambulatory Visit: Payer: Self-pay
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
## 2020-12-02 ENCOUNTER — Other Ambulatory Visit: Payer: Self-pay

## 2020-12-02 MED FILL — Dulaglutide Soln Auto-injector 1.5 MG/0.5ML: SUBCUTANEOUS | 28 days supply | Qty: 2 | Fill #1 | Status: AC

## 2020-12-08 ENCOUNTER — Other Ambulatory Visit (HOSPITAL_COMMUNITY): Payer: Self-pay

## 2020-12-08 ENCOUNTER — Ambulatory Visit (INDEPENDENT_AMBULATORY_CARE_PROVIDER_SITE_OTHER): Payer: Medicaid Other | Admitting: Primary Care

## 2020-12-08 ENCOUNTER — Other Ambulatory Visit: Payer: Self-pay

## 2020-12-08 ENCOUNTER — Encounter (INDEPENDENT_AMBULATORY_CARE_PROVIDER_SITE_OTHER): Payer: Self-pay | Admitting: Primary Care

## 2020-12-08 ENCOUNTER — Other Ambulatory Visit (INDEPENDENT_AMBULATORY_CARE_PROVIDER_SITE_OTHER): Payer: Self-pay | Admitting: Primary Care

## 2020-12-08 VITALS — BP 108/74 | HR 99 | Temp 97.3°F | Resp 16 | Wt 204.0 lb

## 2020-12-08 DIAGNOSIS — E119 Type 2 diabetes mellitus without complications: Secondary | ICD-10-CM

## 2020-12-08 DIAGNOSIS — I10 Essential (primary) hypertension: Secondary | ICD-10-CM

## 2020-12-08 DIAGNOSIS — L601 Onycholysis: Secondary | ICD-10-CM | POA: Diagnosis not present

## 2020-12-08 DIAGNOSIS — Z794 Long term (current) use of insulin: Secondary | ICD-10-CM | POA: Diagnosis not present

## 2020-12-08 DIAGNOSIS — F17200 Nicotine dependence, unspecified, uncomplicated: Secondary | ICD-10-CM | POA: Diagnosis not present

## 2020-12-08 LAB — POCT GLYCOSYLATED HEMOGLOBIN (HGB A1C): HbA1c, POC (controlled diabetic range): 8.8 % — AB (ref 0.0–7.0)

## 2020-12-08 LAB — GLUCOSE, POCT (MANUAL RESULT ENTRY): POC Glucose: 23 mg/dl — AB (ref 70–99)

## 2020-12-08 MED ORDER — DULAGLUTIDE 3 MG/0.5ML ~~LOC~~ SOAJ
3.0000 mg | Freq: Every day | SUBCUTANEOUS | 2 refills | Status: DC
Start: 2020-12-08 — End: 2020-12-08
  Filled 2020-12-08: qty 2, 28d supply, fill #0

## 2020-12-08 NOTE — Progress Notes (Signed)
Diabetes f/u  

## 2020-12-08 NOTE — Telephone Encounter (Signed)
   Notes to clinic: Please verify directions, this should be weekly not daily   Requested Prescriptions  Pending Prescriptions Disp Refills   Dulaglutide 3 MG/0.5ML SOPN 2 mL 2    Sig: Inject 3 mg into the skin daily.      Endocrinology:  Diabetes - GLP-1 Receptor Agonists Failed - 12/08/2020 12:11 PM      Failed - HBA1C is between 0 and 7.9 and within 180 days    HbA1c, POC (controlled diabetic range)  Date Value Ref Range Status  12/08/2020 8.8 (A) 0.0 - 7.0 % Final          Passed - Valid encounter within last 6 months    Recent Outpatient Visits           Today Type 2 diabetes mellitus without complication, with long-term current use of insulin (HCC)   [facility] Grayce Sessions, NP   3 months ago Onycholysis   [facility] Gwinda Passe P, NP   8 months ago Type 2 diabetes mellitus without complication, with long-term current use of insulin [facility])   [place] [facility] [place], Jeannett Senior L, RPH-CPP   8 months ago Type 2 diabetes mellitus without complication, with long-term current use of insulin (HCC)   [facility] Grayce Sessions, NP   1 year ago Type 2 diabetes mellitus without complication, with long-term current use of insulin (HCC)   [facility] Grayce Sessions, NP       Future Appointments             In 4 weeks Drucilla Chalet, RPH-CPP [facility]   In 3 months Randa Evens, Kinnie Scales, NP [facility]

## 2020-12-08 NOTE — Progress Notes (Addendum)
[name] is a 50 y.o. adult who presents for an follow up  evaluation of Type 2 diabetes mellitus.  Current symptoms/problems include foot ulcerations, polydipsia, polyuria, and visual disturbances and have been worsening. Symptoms have been present for 3 weeks.  Current diabetic medications include oral agents (dual therapy):   The patient was initially diagnosed with Type 2 diabetes mellitus based on the following criteria:  ADA guidelines .  Current monitoring regimen: home blood tests - 2 times daily Home blood sugar records: fasting range: 200-  Any episodes of hypoglycemia? no  Known diabetic complications: none Cardiovascular risk factors: diabetes mellitus, dyslipidemia, hypertension, male gender, and obesity (BMI >= 30 kg/m2) Eye exam current (within one year): no Weight trend: fluctuating a little weight loss intentional  Prior visit with CDE: yes -  Current diet: in general, a "healthy" diet   Current exercise: walking Medication Compliance?  Yes   Is She on ACE inhibitor or angiotensin II receptor blocker?  No  Review of Systems  Eyes:  Positive for blurred vision.  Genitourinary:  Positive for frequency.  Endo/Heme/Allergies:  Positive for polydipsia.  Psychiatric/Behavioral:  Positive for depression.   All other systems reviewed and are negative.  Objective:    BP 108/74 (BP Location: Left Arm, Patient Position: Sitting, Cuff Size: Large)   Pulse 99   Temp (!) 97.3 F (36.3 C)   Resp 16   Wt 204 lb (92.5 kg)   SpO2 97%   BMI 28.45 kg/m   Physical Exam Vitals reviewed.  Constitutional:      Appearance: She is obese.  HENT:     Head: Normocephalic.     Right Ear: Tympanic membrane and external ear normal.     Left Ear: Tympanic membrane and external ear normal.     Nose: Nose normal.     Mouth/Throat:     Pharynx: Oropharyngeal exudate and posterior oropharyngeal erythema present.     Comments: Swollen  tonsils  Eyes:     Extraocular Movements: Extraocular movements intact.     Pupils: Pupils are equal, round, and reactive to light.  Cardiovascular:     Rate and Rhythm: Normal rate and regular rhythm.  Pulmonary:     Effort: Pulmonary effort is normal.     Breath sounds: Normal breath sounds.  Abdominal:     General: Bowel sounds are normal. There is distension.     Palpations: Abdomen is soft.  Musculoskeletal:        General: Normal range of motion.     Cervical back: Normal range of motion and neck supple.  Skin:    General: Skin is warm and dry.  Neurological:     Mental Status: She is alert and oriented to person, place, and time.  Psychiatric:        Mood and Affect: Mood normal.        Behavior: Behavior normal.        Thought Content: Thought content normal.     Lab Review Glucose, Bld (mg/dL)  Date Value  04/13/2020 297 (H)  09/18/2019 180 (H)  08/27/2019 316 (H)   CO2 (mmol/L)  Date Value  04/13/2020 28  09/18/2019 26  08/27/2019 26   BUN (mg/dL)  Date Value  04/13/2020 14  09/18/2019 10  08/27/2019 18  05/23/2018 15   Creat (mg/dL)  Date Value  04/13/2020 0.82  09/18/2019 0.76  08/27/2019 0.92      Assessment:  Diabetes Mellitus type II, under fair control.   Chapin was seen today for diabetes.   Type 2 diabetes mellitus without complication, with long-term current use of insulin (HCC) -     Glucose (CBG) -     HgB A1c -     Dulaglutide 3 MG/0.5ML SOPN; Inject 3 mg into the skin daily.  Onycholysis -     Ambulatory referral to Podiatry  Tobacco use disorder - I have recommended complete cessation of tobacco use. I have discussed various options available for assistance with tobacco cessation including over the counter methods (Nicotine gum, patch and lozenges). We also discussed prescription options (Chantix, Nicotine Inhaler / Nasal Spray). The patient is not interested in pursuing any prescription tobacco cessation options at this  time. - Patient declines at this time.   Essential hypertension Blood pressure goal of less than 130/80, is met low-sodium, DASH diet, medication compliance, 150 minutes of moderate intensity exercise per week. Discussed medication compliance, adverse effects.       Plan:    1.  Rx changes:  Trulicty 28m  2.  Education: Reviewed 'ABCs' of diabetes management (respective goals in parentheses):  A1C (<7), blood pressure (<130/80), and cholesterol (LDL <100). Counseled Patient on the risk factors of co- morbidities with uncontrol diabetes  Complications -diabetic retinopathy, (close your eyes ? What do you see nothing) nephropathy decrease in kidney function- can lead to dialysis-on a machine 3 days a week to filter your kidney, neuropathy- numbness and tinging in your hands and feet,  increase risk of heart attack and stroke, and amputation due to decrease wound healing and circulation. Decrease your risk by taking medication daily as prescribed, monitor carbohydrates- foods that are high in carbohydrates are the following rice, potatoes, breads, sugars, and pastas.  Reduction in the intake (eating) will assist in lowering your blood sugars. Exercise daily at least 30 minutes daily.   3. CHO counting diet discussed.  Reviewed CHO amount in various foods and how to read nutrition labels.  Discussed recommended serving sizes.   4.  Recommend check BG 2  times a day  5. Recommended increase physical activity - goal is 150 minutes per week    This note has been created with DSurveyor, quantity Any transcriptional errors are unintentional.   MKerin Perna NP 12/08/2020, 11:37 AM

## 2020-12-09 ENCOUNTER — Other Ambulatory Visit (HOSPITAL_COMMUNITY): Payer: Self-pay

## 2020-12-09 MED ORDER — DULAGLUTIDE 3 MG/0.5ML ~~LOC~~ SOAJ
3.0000 mg | SUBCUTANEOUS | 2 refills | Status: DC
  Filled 2020-12-09 – 2021-01-15 (×2): qty 2, 28d supply, fill #0
  Filled 2021-02-16: qty 2, 28d supply, fill #1
  Filled 2021-02-16: qty 2, 28d supply, fill #0

## 2020-12-11 DIAGNOSIS — Z419 Encounter for procedure for purposes other than remedying health state, unspecified: Secondary | ICD-10-CM | POA: Diagnosis not present

## 2020-12-17 ENCOUNTER — Other Ambulatory Visit (HOSPITAL_COMMUNITY): Payer: Self-pay

## 2020-12-21 ENCOUNTER — Other Ambulatory Visit: Payer: Self-pay

## 2020-12-21 ENCOUNTER — Ambulatory Visit (INDEPENDENT_AMBULATORY_CARE_PROVIDER_SITE_OTHER): Payer: Medicaid Other | Admitting: Podiatry

## 2020-12-21 DIAGNOSIS — L603 Nail dystrophy: Secondary | ICD-10-CM

## 2020-12-21 DIAGNOSIS — S91201A Unspecified open wound of right great toe with damage to nail, initial encounter: Secondary | ICD-10-CM

## 2020-12-21 NOTE — Progress Notes (Signed)
   Subjective: 50 y.o. adult presenting today for evaluation of idiopathic loss to the right hallux nail plate.  Patient states that she got a pedicure about a month ago and after the pedicure she noticed it was loose and sideways.  She picked at it and the nail came off.  She also complains of thickened toenails to the right fifth and third digits.  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  Dermatology: Hyperkeratotic, discolored, thickened, onychodystrophy noted to the bilateral nails 1-5. Skin is warm, dry and supple bilateral lower extremities. Negative for open lesions or macerations. Absence of the toenail also noted to the right hallux nail plate.  Dry stable subungual nailbed noted.  There is a new nail growth to the base of the nail  Vascular: Palpable pedal pulses bilaterally. No edema or erythema noted. Capillary refill within normal limits.  Neurological: Epicritic and protective threshold grossly intact bilaterally.   Musculoskeletal Exam: Range of motion within normal limits to all pedal and ankle joints bilateral. Muscle strength 5/5 in all groups bilateral.   Assessment: #1 Onychomycosis of toenails 1-5 bilateral #2  Loss of toenail right hallux  Plan of Care:  #1 Patient was evaluated. #2  Explained to the patient that it will simply take time for the nail to regrow to the right hallux.  I do not recommend applying an acrylic nail. #3  recommend formula 3 antifungal nail lacquer to the right hallux nail plate.  Nail lacquer provided at checkout #4 return to clinic as needed   Felecia Shelling, DPM [facility]  Dr. Felecia Shelling, DPM    [address] [place], [place] [address]                Office [phone]  Fax [phone]
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
## 2020-12-21 NOTE — Patient Outreach (Signed)
Medicaid Managed Care    Pharmacy Note  12/21/2020 Name: [name] MRN: [id number] DOB: 1970/10/07  [name] is a 50 y.o. year old adult who is a primary care patient of [name], NP. The [facility] Managed Care Coordination team was consulted for assistance with disease management and care coordination needs.    Engaged with patient Engaged with patient by telephone for follow up visit in response to referral for case management and/or care coordination services.  [name] was given information about Managed Medicaid Care Coordination team services today. [name] agreed to services and verbal consent obtained.   Objective:  Lab Results  Component Value Date   CREATININE 0.82 04/13/2020   CREATININE 0.76 09/18/2019   CREATININE 0.92 08/27/2019    Lab Results  Component Value Date   HGBA1C 8.8 (A) 12/08/2020       Component Value Date/Time   CHOL 134 04/13/2020 0910   CHOL 134 03/23/2020 1422   TRIG 313 (H) 04/13/2020 0910   HDL 25 (L) 04/13/2020 0910   HDL 30 (L) 03/23/2020 1422   CHOLHDL 5.4 (H) 04/13/2020 0910   VLDL 47 (H) 11/01/2017 0334   LDLCALC 71 04/13/2020 0910    Other: (TSH, CBC, Vit D, etc.)  Clinical ASCVD: Yes  The ASCVD Risk score [name], et al., 2013) failed to calculate for the following reasons:   The patient has a prior MI or stroke diagnosis    Other: (CHADS2VASc if Afib, PHQ9 if depression, MMRC or CAT for COPD, ACT, DEXA)  BP Readings from Last 3 Encounters:  12/08/20 108/74  09/15/20 131/82  09/14/20 114/78    Assessment/Interventions: Review of patient past medical history, allergies, medications, health status, including review of consultants reports, laboratory and other test data, was performed as part of comprehensive evaluation and provision of chronic care management services.   HIV [place] Plan: At goal,  patient stable/ symptoms controlled   HTN -Doesn't test Spironolactone 160m Plan: At  goal,  patient stable/ symptoms controlled   DM Lab Results  Component Value Date   HGBA1C 8.8 (A) 12/08/2020   HGBA1C 10.1 (A) 08/18/2020   HGBA1C 9.8 (A) 03/23/2020   Lab Results  Component Value Date   MICROALBUR 2.50 (H) 07/20/2011   LDLCALC 71 04/13/2020   CREATININE 0.82 04/13/2020    Lab Results  Component Value Date   NA 133 (L) 04/13/2020   K 4.1 04/13/2020   CREATININE 0.82 04/13/2020   GFRNONAA >60 11/23/2018   GFRAA >60 11/23/2018   GLUCOSE 297 (H) 04/13/2020    Lab Results  Component Value Date   WBC 11.6 (H) 11/13/2019   HGB 16.6 11/13/2019   HCT 48.8 11/13/2019   MCV 96.3 11/13/2019   PLT 337 11/13/2019   Home Testing: Got new meter in late March but has only tested 4x. Was in 190's last time she tested and remembered -Trulicity 3g/day -Metformin 10038mBID Feb 2022 Plan: Will get new meter, then start calling monthly to improve sugars in March March 2022: No meter sent by PCP yet, called again and sent msg to get ASAP. Will f/u 1 week April 2022: Sugars are too high and patient seeing DM specialist in 2 weeks. Asked JeJanett Billowo test daily or every other day and to write down values and bring into specialist. Will f/u 1 month June 2022: Hasn't tested past 3 days Tested in the AM after she eats by 1 hour 6/3: 157 6/2: 143 6/1:  163 5/29: 167 5/27: 169  Plan: Test before not after we eat. Patient has f/u appt tomorrow for labs but stated she can't make it due to lack of transportation. She missed both appts with Ubaldo Glassing and Karna Christmas and never called back. Gave patient phone numbers so she can reschedule and hopefully get help with transportation July 2022: A1c dropped 1.5 percentage points!! She said it's due to her stopping Chees[employer] Cream at night and changing from Diet Sodas to Dr. Malachi Bonds Zero! Didn't have dates but last sugar readings are 102, 134, 141, 121 Plan: PCP increased med last week, will f/u next month   HRT -Estradiol -Spironolactone  137m Plan: At goal,  patient stable/ symptoms controlled   Mood -Doesn't have counselor [facility] 12/08/2020 09/14/2020 08/18/2020  Decreased Interest 0 0 2  Down, Depressed, Hopeless 1 0 1  PHQ - 2 Score 1 0 3  Altered sleeping 1 - 1  Tired, decreased energy 1 - 1  Change in appetite 2 - 1  Feeling bad or failure about yourself  1 - 1  Trouble concentrating 2 - 1  Moving slowly or fidgety/restless 2 - 0  Suicidal thoughts 0 - 0  PHQ-9 Score 10 - 8  Difficult doing work/chores - - Somewhat difficult  Some recent data might be hidden   -Thinks needs higher dose Escitalopram (Mental Health July 2022: Would like a different medication. Not interesting in counseling at this time. Tagged PCP in this note asking if we could alter therapy. Will continue building relationship with patient and hopefully we'll be able to get her a specialist/someone to talk to  Pain Pain Scale: 12/21/20:  With Meds: Said it's bearable  Without Meds: Didn't answer, said "I can't even guess because I've always been on meds" Nerve pain, major back Sx a year June 2022: Patient didn't give a pain scale despite multiple inquiries. Simply states, "Back spasms and tightens up" but she's content on therapy at the moment Gabapentin Tizanidine Celecoxib 1038mBID Plan: At goal,  patient stable/ symptoms controlled  Meds: -[facility] -Patient didn't have meds on her, [place], so unable to go through meds list   SDOH (Social Determinants of Health) assessments and interventions performed:    Care Plan  Allergies  Allergen Reactions   Propoxyphene N-Acetaminophen Other (See Comments)    Stomach cramps    Medications Reviewed Today     Reviewed by EdKerin PernaNP (Nurse Practitioner) on 12/08/20 at 1211  Med List Status: <None>   Medication Order Taking? Sig Documenting Provider Last Dose Status Informant  Accu-Chek FastClix Lancets MISC  [id number] TO TEST BLOOD SUGAR UP TO 4 TIMES DAILY AS DIRECTED EdKerin PernaNP  Active   ACCU-CHEK SOFTCLIX LANCETS lancets [id number] each by Other route 3 (three) times daily.  Patient not taking: Reported on 11/18/2020   GoClent DemarkPA-C  Active            Med Note (KRich ReiningMIDamien Fusi Tue Sep 15, 2020 12:52 PM)    bictegravir-emtricitabine-tenofovir AF (BIKTARVY) 50-200-25 MG TABS tablet [id number] TAKE 1 TABLET BY MOUTH DAILY. VaTommy MedalCoLavell IslamMD Taking Active   blood glucose meter kit and supplies [id number] Dispense based on patient and insurance preference. Use up to four times daily as directed. (FOR ICD-10 E10.9, E11.9). EdKerin PernaNP Taking Active   Blood Glucose Monitoring Suppl (ACCU-CHEK  GUIDE ME) w/Device KIT [id number] Yes USE TO CHECK BLOOD SUGAR UP TO 4 TIMES DAILY AS DIRECTED [name], NP Taking Active   celecoxib (CELEBREX) 200 MG capsule [id number] Yes Take 200 mg by mouth 2 (two) times daily. [provider] Taking Active   Dulaglutide 3 MG/0.5ML SOPN [id number] Yes Inject 3 mg into the skin daily. [name], NP  Active   escitalopram (LEXAPRO) 20 MG tablet [id number] Yes TAKE 1 TABLET BY MOUTH AT BEDTIME [name], NP Taking Active   estradiol (ESTRACE) 2 MG tablet [id number] Yes Take 1 tablet (2 mg total) by mouth daily. [name], [name], MD Taking Active   fenofibrate (TRICOR) 48 MG tablet [id number] Yes TAKE 1 TABLET BY MOUTH DAILY [name], NP Taking Active   gabapentin (NEURONTIN) 300 MG capsule [id number] Yes TAKE 2 CAPSULES BY MOUTH EVERY MORNING AND 1 CAPSULE DAILY WITH LUNCH AND 2 CAPSULES NIGHTLY. [name], MD Taking Active   glipiZIDE (GLUCOTROL) 10 MG tablet [id number] Yes TAKE 1 TABLET BY MOUTH 2 TIMES DAILY BEFORE MEALS [name], NP Taking Active   glucose blood test strip [id number]  USE TO TEST BLOOD SUGAR UP TO 4 TIMES DAILY AS DIRECTED  Patient taking  differently: USE TO TEST BLOOD SUGAR UP TO 4 TIMES DAILY AS DIRECTED   [name], NP  Active   metFORMIN (GLUCOPHAGE) 1000 MG tablet [id number] Yes Take 1 tablet (1,000 mg total) by mouth 2 (two) times daily with a meal. [name], NP Taking Active   pantoprazole (PROTONIX) 40 MG tablet [id number] Yes TAKE 1 TABLET (40 MG TOTAL) BY MOUTH DAILY. [name], NP Taking Active   PROAIR HFA [phone] Base) MCG/ACT inhaler [id number] Yes INHALE 2 PUFFS BY MOUTH INTO LUNGS EVERY 6 HOURS AS NEEDED FOR WHEEZING OR SHORTNESS OF [name], NP Taking Active   spironolactone (ALDACTONE) 100 MG tablet [id number] Yes Take 1 tablet (100 mg total) by mouth daily. [name], [name], MD Taking Active   tiZANidine (ZANAFLEX) 2 MG tablet [id number] Yes TAKE 1 TABLET BY MOUTH 3 TIMES DAILY AS NEEDED FOR MUSCLE SPASMS [name], MD Taking Active             Patient Active Problem List   Diagnosis Date Noted   Right foot drop 09/15/2020   Gait abnormality 09/15/2020   DM type 2 with diabetic peripheral neuropathy ([place]) 09/15/2020   Loss of balance 09/14/2020   S/P lumbar fusion 01/09/2019   Medication monitoring encounter 09/12/2018   Ischemic stroke ([place]) 10/31/2017   Visual field defect    Complicated migraine    Migraine with visual aura    Essential hypertension 06/01/2016   Diabetes mellitus type 2 in obese ([place]) 06/01/2016   Poorly controlled diabetes mellitus ([place]) 04/27/2015   Hearing loss secondary to cerumen impaction 02/07/2013   Smoker 03/07/2011   Asthma 02/07/2008   Male-to-male transgender person 10/31/2007   Depression with anxiety 10/17/2007   Human immunodeficiency virus (HIV) disease ([place]) 04/14/2006   HIDRADENITIS SUPPURATIVA 04/14/2006    Conditions to be addressed/monitored: HTN, DM and Pain    Care Plan : Medication Management  Updates made by Lane Hacker, [place] since 08/05/2020 12:00 AM     Problem: Health Promotion  or Disease Self-Management (General Plan of Care)      Goal: Medication Management   Note:   Current Barriers:  Unable to independently monitor therapeutic efficacy Unable to achieve  control of DM  Does not maintain contact with provider office Does not contact provider office for questions/concerns Lost monitor in move (Changed apartments in October 2021) and hasn't checked sugars since  Pharmacist Clinical Goal(s):  Over the next 30 days, patient will contact provider office for questions/concerns as evidenced notation of same in electronic health record through collaboration with PharmD and provider.    Interventions: Inter-disciplinary care team collaboration (see longitudinal plan of care) Comprehensive medication review performed; medication list updated in electronic medical record  '@RXCPDIABETES' @ '@RXCPMENTALHEALTH' @ '@RXCPOSTEOPOROSIS' @  Patient Goals/Self-Care Activities Over the next 30 days, patient will:  - take medications as prescribed check glucose Daily, document, and provide at future appointments collaborate with provider on medication access solutions  Follow Up Plan: The care management team will reach out to the patient again over the next 30 days.      Task: Mutually Develop and Royce Macadamia Achievement of Patient Goals   Note:   Care Management Activities:    - verbalization of feelings encouraged    Notes:      Medication Assistance: None required. Patient affirms current coverage meets needs.   Follow up: Agree  Plan: The care management team will reach out to the patient again over the next 30 days.   [place] Constable, Pharm.D., Managed Medicaid Pharmacist - [phone]

## 2020-12-28 ENCOUNTER — Other Ambulatory Visit: Payer: Self-pay

## 2020-12-28 ENCOUNTER — Other Ambulatory Visit: Payer: Self-pay | Admitting: *Deleted
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
## 2021-01-05 ENCOUNTER — Ambulatory Visit: Payer: Medicaid Other | Admitting: Pharmacist

## 2021-01-11 ENCOUNTER — Other Ambulatory Visit: Payer: Self-pay

## 2021-01-11 DIAGNOSIS — Z419 Encounter for procedure for purposes other than remedying health state, unspecified: Secondary | ICD-10-CM | POA: Diagnosis not present

## 2021-01-15 ENCOUNTER — Other Ambulatory Visit: Payer: Self-pay

## 2021-01-26 ENCOUNTER — Encounter: Payer: Medicaid Other | Admitting: *Deleted

## 2021-01-27 ENCOUNTER — Other Ambulatory Visit: Payer: Self-pay

## 2021-01-27 ENCOUNTER — Other Ambulatory Visit: Payer: Medicaid Other

## 2021-01-27 ENCOUNTER — Encounter (INDEPENDENT_AMBULATORY_CARE_PROVIDER_SITE_OTHER): Payer: Medicaid Other | Admitting: *Deleted

## 2021-01-27 VITALS — BP 150/96 | HR 98 | Temp 98.7°F | Wt 213.0 lb

## 2021-01-27 DIAGNOSIS — Z006 Encounter for examination for normal comparison and control in clinical research program: Secondary | ICD-10-CM

## 2021-01-27 DIAGNOSIS — B20 Human immunodeficiency virus [HIV] disease: Secondary | ICD-10-CM

## 2021-01-27 DIAGNOSIS — Z113 Encounter for screening for infections with a predominantly sexual mode of transmission: Secondary | ICD-10-CM

## 2021-01-27 DIAGNOSIS — R2689 Other abnormalities of gait and mobility: Secondary | ICD-10-CM

## 2021-01-28 LAB — T-HELPER CELL (CD4) - (RCID CLINIC ONLY)
CD4 % Helper T Cell: 39 % (ref 33–65)
CD4 T Cell Abs: 1171 /uL (ref 400–1790)

## 2021-01-28 NOTE — Research (Signed)
[name] was here for her last visit for the [name] study. She says she is doing fine now. She did have an evaluation for rt foot drop in April but hasn't followed up with the MRI yet. She said it has been like that since her lumbar fusion in 2020. She had tonsillitis in June and was treated for it. It resolved with out any problem. She was seen in July for her toenail falling off and was diagnosed with onychomycosis. Her BP was a little elevated but she said she hadn't taken her aldactone today and that is probably why it's up. I offered her an assessment with our PA, but she declined.

## 2021-01-29 LAB — COMPLETE METABOLIC PANEL WITH GFR
AG Ratio: 1.1 (calc) (ref 1.0–2.5)
ALT: 13 U/L (ref 9–46)
AST: 15 U/L (ref 10–35)
Albumin: 4 g/dL (ref 3.6–5.1)
Alkaline phosphatase (APISO): 105 U/L (ref 35–144)
BUN/Creatinine Ratio: 13 (calc) (ref 6–22)
BUN: 8 mg/dL (ref 7–25)
CO2: 28 mmol/L (ref 20–32)
Calcium: 8.9 mg/dL (ref 8.6–10.3)
Chloride: 102 mmol/L (ref 98–110)
Creat: 0.63 mg/dL — ABNORMAL LOW (ref 0.70–1.30)
Globulin: 3.6 g/dL (calc) (ref 1.9–3.7)
Glucose, Bld: 142 mg/dL — ABNORMAL HIGH (ref 65–99)
Potassium: 3.7 mmol/L (ref 3.5–5.3)
Sodium: 138 mmol/L (ref 135–146)
Total Bilirubin: 0.5 mg/dL (ref 0.2–1.2)
Total Protein: 7.6 g/dL (ref 6.1–8.1)
eGFR: 116 mL/min/{1.73_m2} (ref >=60)

## 2021-01-29 LAB — CBC WITH DIFFERENTIAL/PLATELET
Absolute Monocytes: 819 cells/uL (ref 200–950)
Basophils Absolute: 36 cells/uL (ref 0–200)
Basophils Relative: 0.4 %
Eosinophils Absolute: 109 cells/uL (ref 15–500)
Eosinophils Relative: 1.2 %
HCT: 47 % (ref 38.5–50.0)
Hemoglobin: 16 g/dL (ref 13.2–17.1)
Lymphs Abs: 3276 cells/uL (ref 850–3900)
MCH: 29.5 pg (ref 27.0–33.0)
MCHC: 34 g/dL (ref 32.0–36.0)
MCV: 86.6 fL (ref 80.0–100.0)
MPV: 10.3 fL (ref 7.5–12.5)
Monocytes Relative: 9 %
Neutro Abs: 4859 cells/uL (ref 1500–7800)
Neutrophils Relative %: 53.4 %
Platelets: 279 10*3/uL (ref 140–400)
RBC: 5.43 10*6/uL (ref 4.20–5.80)
RDW: 13.7 % (ref 11.0–15.0)
Total Lymphocyte: 36 %
WBC: 9.1 10*3/uL (ref 3.8–10.8)

## 2021-01-29 LAB — FLUORESCENT TREPONEMAL AB(FTA)-IGG-BLD: Fluorescent Treponemal ABS: REACTIVE — AB

## 2021-01-29 LAB — RPR: RPR Ser Ql: REACTIVE — AB

## 2021-01-29 LAB — HIV-1 RNA QUANT-NO REFLEX-BLD
HIV 1 RNA Quant: 59300 Copies/mL — ABNORMAL HIGH
HIV-1 RNA Quant, Log: 4.77 Log cps/mL — ABNORMAL HIGH

## 2021-01-29 LAB — RPR TITER: RPR Titer: 1:4 {titer} — ABNORMAL HIGH

## 2021-01-29 NOTE — Telephone Encounter (Signed)
Spoke with patient, relayed positive syphilis results. Per Dr. [name], patient will need to be treated with Bicillin 2.4 million units IM weekly x 3. Patient scheduled for first dose 02/01/21. Advised patient to abstain from sexual activity until treatment is complete and to notify partners for testing and treatment. Patient verbalized understanding and has no further questions.   Sandie Ano, RN

## 2021-02-01 ENCOUNTER — Telehealth: Payer: Self-pay

## 2021-02-01 ENCOUNTER — Ambulatory Visit (INDEPENDENT_AMBULATORY_CARE_PROVIDER_SITE_OTHER): Payer: Medicaid Other

## 2021-02-01 ENCOUNTER — Other Ambulatory Visit: Payer: Self-pay

## 2021-02-01 DIAGNOSIS — A539 Syphilis, unspecified: Secondary | ICD-10-CM

## 2021-02-01 MED ORDER — PENICILLIN G BENZATHINE 1200000 UNIT/2ML IM SUSY
1.2000 10*6.[IU] | PREFILLED_SYRINGE | Freq: Once | INTRAMUSCULAR | Status: AC
  Administered 2021-02-01: 1.2 10*6.[IU] via INTRAMUSCULAR

## 2021-02-01 NOTE — Telephone Encounter (Signed)
-----   Message from Randall Hiss, MD sent at 01/29/2021  6:09 PM EDT ----- Pt will need 3 IM PCN shots of 2.4 MU ----- Message ----- From: Interface, [facility] Lab Results In Sent: 01/27/2021  10:53 PM EDT To: Randall Hiss, MD

## 2021-02-01 NOTE — Telephone Encounter (Signed)
Patient scheduled for nurse visit 02/01/21.   Sandie Ano, RN

## 2021-02-01 NOTE — Progress Notes (Signed)
Reviewed and verified allergies with patient. Patient tolerated Bicillin injections well. Reinforced abstinence until treatment completed plus an additional 10 days, offered condoms and encouraged use. Advised patient to notify sexual partners for testing and treatment. Patient verbalized understanding.   Daiden Coltrane D Monesha Monreal, RN  

## 2021-02-08 ENCOUNTER — Ambulatory Visit (INDEPENDENT_AMBULATORY_CARE_PROVIDER_SITE_OTHER): Payer: Medicaid Other

## 2021-02-08 ENCOUNTER — Other Ambulatory Visit: Payer: Self-pay

## 2021-02-08 DIAGNOSIS — A539 Syphilis, unspecified: Secondary | ICD-10-CM

## 2021-02-08 MED ORDER — PENICILLIN G BENZATHINE 1200000 UNIT/2ML IM SUSY
1.2000 10*6.[IU] | PREFILLED_SYRINGE | Freq: Once | INTRAMUSCULAR | Status: AC
  Administered 2021-02-08: 1.2 10*6.[IU] via INTRAMUSCULAR

## 2021-02-11 ENCOUNTER — Other Ambulatory Visit: Payer: Self-pay

## 2021-02-11 ENCOUNTER — Other Ambulatory Visit (HOSPITAL_COMMUNITY): Payer: Self-pay

## 2021-02-11 ENCOUNTER — Telehealth: Payer: Self-pay

## 2021-02-11 DIAGNOSIS — Z419 Encounter for procedure for purposes other than remedying health state, unspecified: Secondary | ICD-10-CM | POA: Diagnosis not present

## 2021-02-11 DIAGNOSIS — B20 Human immunodeficiency virus [HIV] disease: Secondary | ICD-10-CM

## 2021-02-11 NOTE — Telephone Encounter (Signed)
Received notification from [facility] that they have been unable to get in touch with the patient and that she has not picked up [facility] since March. Patient has been in the office multiple times recently and has not mentioned being out of medication.   Called patient, she sounded surprised when RN relayed that pharmacy has not been able to get in touch with her since March. She denies being out of medication or missing doses. She is asking for a refill as she is almost out.   Scheduled her for follow up and asked her to call if she ever needs helps getting her medication. Patient verbalized understanding and has no further questions.   Sandie Ano, RN

## 2021-02-12 ENCOUNTER — Other Ambulatory Visit (HOSPITAL_COMMUNITY): Payer: Self-pay

## 2021-02-12 MED ORDER — BICTEGRAVIR-EMTRICITAB-TENOFOV 50-200-25 MG PO TABS
1.0000 | ORAL_TABLET | Freq: Every day | ORAL | 2 refills | Status: AC
  Filled 2021-02-12: qty 30, 30d supply, fill #0

## 2021-02-12 NOTE — Addendum Note (Signed)
Addended by: Linna Hoff D on: 02/12/2021 12:31 PM   Modules accepted: Orders

## 2021-02-16 ENCOUNTER — Other Ambulatory Visit: Payer: Self-pay

## 2021-02-16 ENCOUNTER — Ambulatory Visit (INDEPENDENT_AMBULATORY_CARE_PROVIDER_SITE_OTHER): Payer: Medicaid Other

## 2021-02-16 ENCOUNTER — Other Ambulatory Visit (HOSPITAL_COMMUNITY): Payer: Self-pay

## 2021-02-16 DIAGNOSIS — A539 Syphilis, unspecified: Secondary | ICD-10-CM

## 2021-02-16 MED ORDER — PENICILLIN G BENZATHINE 1200000 UNIT/2ML IM SUSY
1.2000 10*6.[IU] | PREFILLED_SYRINGE | Freq: Once | INTRAMUSCULAR | Status: AC
  Administered 2021-02-16: 1.2 10*6.[IU] via INTRAMUSCULAR

## 2021-02-16 NOTE — Progress Notes (Signed)
Reviewed and verified allergies with patient. Patient tolerated Bicillin injections well. Reinforced abstinence until treatment completed plus an additional 10 days, offered condoms and encouraged use. Advised patient to notify sexual partners for testing and treatment. Patient verbalized understanding.   Samuella Rasool D Roshanda Balazs, RN  

## 2021-03-01 ENCOUNTER — Other Ambulatory Visit: Payer: Self-pay | Admitting: *Deleted

## 2021-03-01 ENCOUNTER — Other Ambulatory Visit: Payer: Self-pay

## 2021-03-01 DIAGNOSIS — Z5941 Food insecurity: Secondary | ICD-10-CM
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
## 2021-03-01 NOTE — Patient Outreach (Signed)
Medicaid Managed Care   Nurse Care Manager Note  03/01/2021 Name:  [name] MRN:  [id number] DOB:  [birth year]  [name] is an 50 y.o. year old adult who is a primary patient of [name], NP.  [facility] Managed Care Coordination team was consulted for assistance with:    DMII  Ms. Stroschein was given information about Medicaid Managed Care Coordination team services today. [name] Patient agreed to services and verbal consent obtained.  Engaged with patient by telephone for follow up visit in response to provider referral for case management and/or care coordination services.   Assessments/Interventions:  Review of past medical history, allergies, medications, health status, including review of consultants reports, laboratory and other test data, was performed as part of comprehensive evaluation and provision of chronic care management services.  SDOH (Social Determinants of Health) assessments and interventions performed: SDOH Interventions    Flowsheet Row Most Recent Value  SDOH Interventions   Food Insecurity Interventions Other (Comment)  [Care Guide referral for assistance with local food pantries]  Physical Activity Interventions Intervention Not Indicated       Care Plan  Allergies  Allergen Reactions   Propoxyphene N-Acetaminophen Other (See Comments)    Stomach cramps    Medications Reviewed Today     Reviewed by Melissa Montane, RN (Registered Nurse) on 12/28/20 at 1500  Med List Status: <None>   Medication Order Taking? Sig Documenting Provider Last Dose Status Informant  Accu-Chek FastClix Lancets MISC [id number]  USE TO TEST BLOOD SUGAR UP TO 4 TIMES DAILY AS DIRECTED [name], NP  Active   ACCU-CHEK SOFTCLIX LANCETS lancets [id number]  1 each by Other route 3 (three) times daily.  Patient not taking: Reported on 11/18/2020   Clent Demark, PA-C  Active            Med Note Rich Reining, Damien Fusi   Tue Sep 15, 2020 12:52  PM)    bictegravir-emtricitabine-tenofovir AF (BIKTARVY) 50-200-25 MG TABS tablet [id number] Yes TAKE 1 TABLET BY MOUTH DAILY. [name], [name], MD Taking Active   blood glucose meter kit and supplies [id number]  Dispense based on patient and insurance preference. Use up to four times daily as directed. (FOR ICD-10 E10.9, E11.9). [name], NP  Active   Blood Glucose Monitoring Suppl (ACCU-CHEK GUIDE ME) w/Device KIT [id number]  USE TO CHECK BLOOD SUGAR UP TO 4 TIMES DAILY AS DIRECTED [name], NP  Active   celecoxib (CELEBREX) 200 MG capsule [id number] Yes Take 200 mg by mouth 2 (two) times daily. [provider] Taking Active   Dulaglutide 3 MG/0.5ML SOPN [id number] No Inject 3 mg into the skin once a week.  Patient not taking: Reported on 12/28/2020   Charlott Rakes, MD Not Taking Active            Med Note [name], Teo Moede A   Mon Dec 28, 2020  2:59 PM) Will start next week  escitalopram (LEXAPRO) 20 MG tablet [id number] Yes TAKE 1 TABLET BY MOUTH AT BEDTIME [name], NP Taking Active   estradiol (ESTRACE) 2 MG tablet [id number] Yes Take 1 tablet (2 mg total) by mouth daily. [name], [name], MD Taking Active   fenofibrate (TRICOR) 48 MG tablet [id number] Yes TAKE 1 TABLET BY MOUTH DAILY [name], NP Taking Active   gabapentin (NEURONTIN) 300 MG capsule [id number] Yes TAKE 2 CAPSULES BY MOUTH EVERY MORNING AND 1 CAPSULE DAILY WITH LUNCH  AND 2 CAPSULES NIGHTLY. [name], MD Taking Active   glipiZIDE (GLUCOTROL) 10 MG tablet [id number] Yes TAKE 1 TABLET BY MOUTH 2 TIMES DAILY BEFORE MEALS [name], NP Taking Active   glucose blood test strip [id number]  USE TO TEST BLOOD SUGAR UP TO 4 TIMES DAILY AS DIRECTED  Patient taking differently: USE TO TEST BLOOD SUGAR UP TO 4 TIMES DAILY AS DIRECTED   [name], NP  Active   metFORMIN (GLUCOPHAGE) 1000 MG tablet [id number] Yes Take 1 tablet (1,000 mg total) by mouth 2 (two)  times daily with a meal. [name], NP Taking Active   pantoprazole (PROTONIX) 40 MG tablet [id number] Yes TAKE 1 TABLET (40 MG TOTAL) BY MOUTH DAILY. [name], NP Taking Active   PROAIR HFA 108 [phone] Base) MCG/ACT inhaler [id number] Yes INHALE 2 PUFFS BY MOUTH INTO LUNGS EVERY 6 HOURS AS NEEDED FOR WHEEZING OR SHORTNESS OF [name], NP Taking Active   spironolactone (ALDACTONE) 100 MG tablet [id number] Yes Take 1 tablet (100 mg total) by mouth daily. [name], [name], MD Taking Active   tiZANidine (ZANAFLEX) 2 MG tablet [id number] Yes TAKE 1 TABLET BY MOUTH 3 TIMES DAILY AS NEEDED FOR MUSCLE SPASMS [name], MD Taking Active             Patient Active Problem List   Diagnosis Date Noted   Right foot drop 09/15/2020   Gait abnormality 09/15/2020   DM type 2 with diabetic peripheral neuropathy ([place]) 09/15/2020   Loss of balance 09/14/2020   S/P lumbar fusion 01/09/2019   Medication monitoring encounter 09/12/2018   Ischemic stroke ([place]) 10/31/2017   Visual field defect    Complicated migraine    Migraine with visual aura    Essential hypertension 06/01/2016   Diabetes mellitus type 2 in obese ([place]) 06/01/2016   Poorly controlled diabetes mellitus ([place]) 04/27/2015   Hearing loss secondary to cerumen impaction 02/07/2013   Smoker 03/07/2011   Asthma 02/07/2008   Male-to-male transgender person 10/31/2007   Depression with anxiety 10/17/2007   Human immunodeficiency virus (HIV) disease ([place]) 04/14/2006   HIDRADENITIS SUPPURATIVA 04/14/2006    Conditions to be addressed/monitored per PCP order:  DMII  Care Plan : Diabetes Type 2 (Adult)  Updates made by Melissa Montane, RN since 03/01/2021 12:00 AM     Problem: Disease Management related to Diabetes      Long-Range Goal: Development of Plan of Care for needs related to Diabetes   Start Date: 03/01/2021  Expected End Date: 05/30/2021  Priority: High  Note:   Current Barriers:   Chronic Disease Management support and education needs related to [facility] Financial Constraints.  Transportation barriers  RNCM Clinical Goal(s):  Patient will verbalize understanding of plan for management of DMII take all medications exactly as prescribed and will call provider for medication related questions attend all scheduled medical appointments: PCP 9/28 and Infectious Disease 9/28 demonstrate improved adherence to prescribed treatment plan for DMII as evidenced by daily monitoring and recording of CBG  adherence to ADA/ carb modified diet exercise 7 days/week adherence to prescribed medication regimen contacting provider for new or worsened symptoms or questions  work with community resource care guide to address needs related to Limited access to food    Interventions:  Inter-disciplinary care team collaboration (see longitudinal plan of care) Evaluation of current treatment plan related to  self management and patient's adherence to plan as established by provider Provided patient with  contact information for medical transportation provided by [facility] [phone], call 2-3 days before your appointment to arrange transportation   Diabetes:  (Status: New goal.) Lab Results  Component Value Date   HGBA1C 8.8 (A) 12/08/2020  Assessed patient's understanding of A1c goal: <7% Provided education to patient about basic DM disease process; Reviewed medications with patient and discussed importance of medication adherence;        Discussed plans with patient for ongoing care management follow up and provided patient with direct contact information for care management team;      Provided patient with written educational materials related to hypo and hyperglycemia and importance of correct treatment;       Reviewed scheduled/upcoming provider appointments including: PCP 9/28 and Infectious Disease 9/28;         Referral made to community resources care guide team for assistance with food  resources;      Assessed social determinant of health barriers;         Patient Goals/Self-Care Activities: Patient will self administer medications as prescribed Patient will attend all scheduled provider appointments Patient will call pharmacy for medication refills Patient will continue to perform ADL's independently Patient will continue to perform IADL's independently Patient will call provider office for new concerns or questions       Follow Up:  Patient agrees to Care Plan and Follow-up.  Plan: The Managed Medicaid care management team will reach out to the patient again over the next 30 days.  Date/time of next scheduled RN care management/care coordination outreach:  04/01/21 @ 2:30pm  Lurena Joiner RN, BSN [place] RN Care Coordinator

## 2021-03-10 ENCOUNTER — Ambulatory Visit: Payer: Medicaid Other | Admitting: Infectious Disease

## 2021-03-10 ENCOUNTER — Other Ambulatory Visit (HOSPITAL_COMMUNITY): Payer: Self-pay

## 2021-03-10 ENCOUNTER — Ambulatory Visit (INDEPENDENT_AMBULATORY_CARE_PROVIDER_SITE_OTHER): Payer: Medicaid Other | Admitting: Primary Care

## 2021-03-11 ENCOUNTER — Telehealth: Payer: Self-pay

## 2021-03-11 NOTE — Telephone Encounter (Signed)
   Telephone encounter was:  Unsuccessful.  03/11/2021 Name: [name] MRN: [id number] DOB: 05-15-1971  Unsuccessful outbound call made today to assist with:  Unable to leave message voicemail is full.  Outreach Attempt:  1st Attempt   Raihan Kimmel, AAS Paralegal, CHC Care Guide  Embedded Care Coordination [place]  Care Management  [address] [place], [place] [address] ??[email or web address]  ?? [phone]   [email or web address]

## 2021-03-13 DIAGNOSIS — Z419 Encounter for procedure for purposes other than remedying health state, unspecified: Secondary | ICD-10-CM | POA: Diagnosis not present

## 2021-03-16 ENCOUNTER — Telehealth: Payer: Self-pay | Admitting: Pharmacist

## 2021-03-16 ENCOUNTER — Ambulatory Visit: Payer: Self-pay

## 2021-03-16 NOTE — Patient Outreach (Signed)
03/16/2021 Name: [name] MRN: [id number] DOB: 1971-02-22  Referred by: Grayce Sessions, NP Reason for referral : No chief complaint on file.   An unsuccessful telephone outreach was attempted today. The patient was referred to the case management team for assistance with care management and care coordination.    Follow Up Plan: The Managed Medicaid care management team will reach out to the patient again over the next 10 days.    Cheral Almas PharmD, CPP High Risk Managed Medicaid [place] [phone]

## 2021-03-29 ENCOUNTER — Telehealth: Payer: Self-pay

## 2021-03-29 NOTE — Telephone Encounter (Signed)
   Telephone encounter was:  Unsuccessful.  03/29/2021 Name: OWAIN ECKERMAN MRN: [id number] DOB: 02-23-1971  Unsuccessful outbound call made today to assist with:  Transportation Needs , Food Insecurity, and unable to leave message voicemail does not pick up.  Outreach Attempt:  2nd Attempt   Demorris Choyce, AAS Paralegal, CHC Care Guide  Embedded Care Coordination [place]  Care Management  [address] [place], [place] [address] ??[email or web address]  ?? [phone]   [email or web address]

## 2021-03-29 NOTE — Telephone Encounter (Signed)
   Telephone encounter was:  Unsuccessful.  03/29/2021 Name: [name] MRN: [id number] DOB: 1970-09-23  Unsuccessful outbound call made today to assist with:  Unable to leave message voicemail does not pick up.  Outreach Attempt:  3rd Attempt.  Referral closed unable to contact patient.   [name], AAS Paralegal, [facility] Care Guide  Embedded Care Coordination [place]  Care Management  [address] [place], [place] [address] ??[email or web address]  ?? [phone]   [email or web address]

## 2021-04-01 ENCOUNTER — Other Ambulatory Visit: Payer: Self-pay | Admitting: *Deleted

## 2021-04-01 NOTE — Patient Instructions (Signed)
Visit Information  Ms. Marguerite Olea  - as a part of your Medicaid benefit, you are eligible for care management and care coordination services at no cost or copay. I was unable to reach you by phone today but would be happy to help you with your health related needs. Please feel free to call me @ [phone].   A member of the Managed Medicaid care management team will reach out to you again over the next 14 days.   Estanislado Emms RN, BSN [place]  [facility] Economist

## 2021-04-01 NOTE — Patient Outreach (Signed)
Care Coordination  04/01/2021  ISAURO SKELLEY 12/05/1970 [id number]   Medicaid Managed Care   Unsuccessful Outreach Note  04/01/2021 Name: EXANDER SHAUL MRN: [id number] DOB: 1970/08/09  Referred by: Grayce Sessions, NP Reason for referral : High Risk Managed Medicaid (Unsuccessful RNCM follow up outreach)   An unsuccessful telephone outreach was attempted today. The patient was referred to the case management team for assistance with care management and care coordination.   Follow Up Plan: The care management team will reach out to the patient again over the next 14 days.   Estanislado Emms RN, BSN [place]  [facility]

## 2021-04-05 ENCOUNTER — Telehealth: Payer: Self-pay

## 2021-04-05 NOTE — Telephone Encounter (Signed)
Called patient to reschedule missed appointment, line rang busy.   Sandie Ano, RN

## 2021-04-13 DIAGNOSIS — Z419 Encounter for procedure for purposes other than remedying health state, unspecified: Secondary | ICD-10-CM | POA: Diagnosis not present

## 2021-04-15 ENCOUNTER — Other Ambulatory Visit: Payer: Self-pay | Admitting: *Deleted

## 2021-04-15 NOTE — Patient Outreach (Signed)
Care Coordination  04/15/2021  [name] November 27, 1970 [id number]   Medicaid Managed Care   Unsuccessful Outreach Note  04/15/2021 Name: [name] MRN: [id number] DOB: [birth year]  Referred by: Grayce Sessions, NP Reason for referral : High Risk Managed Medicaid (Unsuccessful RNCM follow up outreach, 2nd attempt)   A second unsuccessful telephone outreach was attempted today. The patient was referred to the case management team for assistance with care management and care coordination.   Follow Up Plan: The care management team will reach out to the patient again over the next 14 days.   Estanislado Emms RN, BSN [place]  [facility]

## 2021-04-15 NOTE — Patient Instructions (Signed)
Visit Information  [name]  - as a part of your Medicaid benefit, you are eligible for care management and care coordination services at no cost or copay. I was unable to reach you by phone today but would be happy to help you with your health related needs. Please feel free to call me @ [phone].   A member of the Managed Medicaid care management team will reach out to you again over the next 14 days.   Estanislado Emms RN, BSN [name]  [facility]

## 2021-04-20 ENCOUNTER — Other Ambulatory Visit: Payer: Self-pay | Admitting: *Deleted

## 2021-04-20 NOTE — Patient Outreach (Signed)
Care Coordination  04/20/2021  [name] 02-02-1971 [id number]   Medicaid Managed Care   Unsuccessful Outreach Note  04/20/2021 Name: [name] MRN: [id number] DOB: [birth year]  Referred by: Grayce Sessions, NP Reason for referral : High Risk Managed Medicaid (Unsuccessful RNCM follow up telephone outreach, 3rd attempt)   Third unsuccessful telephone outreach was attempted today. The patient was referred to the case management team for assistance with care management and care coordination. The patient's primary care provider has been notified of our unsuccessful attempts to make or maintain contact with the patient. The care management team is pleased to engage with this patient at any time in the future should he/she be interested in assistance from the care management team.   Follow Up Plan: We have been unable to make contact with the patient for follow up. The care management team is available to follow up with the patient after provider conversation with the patient regarding recommendation for care management engagement and subsequent re-referral to the care management team.   Estanislado Emms RN, BSN [place]  [facility] RN Care Coordinator

## 2021-05-13 DIAGNOSIS — Z419 Encounter for procedure for purposes other than remedying health state, unspecified: Secondary | ICD-10-CM | POA: Diagnosis not present

## 2021-05-14 ENCOUNTER — Other Ambulatory Visit: Payer: Self-pay

## 2021-06-13 DIAGNOSIS — Z419 Encounter for procedure for purposes other than remedying health state, unspecified: Secondary | ICD-10-CM | POA: Diagnosis not present

## 2021-07-07 ENCOUNTER — Telehealth: Payer: Self-pay

## 2021-07-07 NOTE — Telephone Encounter (Signed)
Called patient to offer appointment, no answer and unable to leave voicemail. ° ° °Tisheena Maguire D Anusha Claus, RN ° °

## 2021-07-14 DIAGNOSIS — Z419 Encounter for procedure for purposes other than remedying health state, unspecified: Secondary | ICD-10-CM | POA: Diagnosis not present

## 2021-08-11 DIAGNOSIS — Z419 Encounter for procedure for purposes other than remedying health state, unspecified: Secondary | ICD-10-CM | POA: Diagnosis not present

## 2021-09-11 DIAGNOSIS — Z419 Encounter for procedure for purposes other than remedying health state, unspecified: Secondary | ICD-10-CM | POA: Diagnosis not present

## 2021-10-11 DIAGNOSIS — Z419 Encounter for procedure for purposes other than remedying health state, unspecified: Secondary | ICD-10-CM | POA: Diagnosis not present

## 2021-11-11 DIAGNOSIS — Z419 Encounter for procedure for purposes other than remedying health state, unspecified: Secondary | ICD-10-CM | POA: Diagnosis not present

## 2021-11-25 ENCOUNTER — Other Ambulatory Visit (HOSPITAL_COMMUNITY): Payer: Self-pay

## 2021-11-30 ENCOUNTER — Telehealth: Payer: Self-pay

## 2021-11-30 NOTE — Telephone Encounter (Signed)
Received voicemail from [facility] pharmacy requesting that patient's medications be transferred to them.   Does not appear patient has been filling any of her medications and is overdue for follow up. Called [name] to confirm pharmacy change and schedule for follow up, call could not be completed.   [facility]  P: [phone] F: [phone]  Sandie Ano, RN

## 2021-12-01 NOTE — Telephone Encounter (Signed)
Attempted to contact the patient. No answer or voicemail.

## 2021-12-06 ENCOUNTER — Telehealth: Payer: Self-pay

## 2021-12-08 NOTE — Telephone Encounter (Signed)
Received another call from [facility] requesting medication refill. Patient is overdue for follow up visit ans staff has been trying to reach [name] to confirm pharmacy and schedule appointment. Pharmacy noted this information in their records  Will also send patient a mychart message.  [facility] P: [phone] F: [phone]  [name]

## 2021-12-11 DIAGNOSIS — Z419 Encounter for procedure for purposes other than remedying health state, unspecified: Secondary | ICD-10-CM | POA: Diagnosis not present

## 2022-01-11 DIAGNOSIS — Z419 Encounter for procedure for purposes other than remedying health state, unspecified: Secondary | ICD-10-CM | POA: Diagnosis not present

## 2022-01-13 DIAGNOSIS — E11621 Type 2 diabetes mellitus with foot ulcer: Secondary | ICD-10-CM | POA: Diagnosis not present

## 2022-01-13 DIAGNOSIS — L97519 Non-pressure chronic ulcer of other part of right foot with unspecified severity: Secondary | ICD-10-CM | POA: Diagnosis not present

## 2022-01-13 DIAGNOSIS — M7712 Lateral epicondylitis, left elbow: Secondary | ICD-10-CM | POA: Diagnosis not present

## 2022-01-13 DIAGNOSIS — Z21 Asymptomatic human immunodeficiency virus [HIV] infection status: Secondary | ICD-10-CM | POA: Diagnosis not present

## 2022-01-14 ENCOUNTER — Telehealth: Payer: Self-pay

## 2022-01-17 NOTE — Telephone Encounter (Signed)
Transition Care Management Unsuccessful Follow-up Telephone Call  Date of discharge and from where:  01/13/2022 from [facility]  Attempts:  1st Attempt  Reason for unsuccessful TCM follow-up call:  Left voice message

## 2022-01-19 NOTE — Telephone Encounter (Signed)
Transition Care Management Unsuccessful Follow-up Telephone Call  Date of discharge and from where:  01/13/2022 from [facility]  Attempts:  2nd Attempt  Reason for unsuccessful TCM follow-up call:  Left voice message

## 2022-01-21 NOTE — Telephone Encounter (Signed)
Transition Care Management Unsuccessful Follow-up Telephone Call  Date of discharge and from where:  01/13/2022 from [facility]  Attempts:  3rd Attempt  Reason for unsuccessful TCM follow-up call:  Unable to reach patient

## 2022-02-04 DIAGNOSIS — E1169 Type 2 diabetes mellitus with other specified complication: Secondary | ICD-10-CM | POA: Diagnosis not present

## 2022-02-04 DIAGNOSIS — M7989 Other specified soft tissue disorders: Secondary | ICD-10-CM | POA: Diagnosis not present

## 2022-02-04 DIAGNOSIS — M86171 Other acute osteomyelitis, right ankle and foot: Secondary | ICD-10-CM | POA: Diagnosis not present

## 2022-02-05 DIAGNOSIS — E119 Type 2 diabetes mellitus without complications: Secondary | ICD-10-CM | POA: Diagnosis not present

## 2022-02-05 DIAGNOSIS — Z794 Long term (current) use of insulin: Secondary | ICD-10-CM | POA: Diagnosis not present

## 2022-02-05 DIAGNOSIS — N184 Chronic kidney disease, stage 4 (severe): Secondary | ICD-10-CM | POA: Diagnosis not present

## 2022-02-05 DIAGNOSIS — B20 Human immunodeficiency virus [HIV] disease: Secondary | ICD-10-CM | POA: Diagnosis not present

## 2022-02-05 DIAGNOSIS — L03115 Cellulitis of right lower limb: Secondary | ICD-10-CM | POA: Diagnosis not present

## 2022-02-05 DIAGNOSIS — E11621 Type 2 diabetes mellitus with foot ulcer: Secondary | ICD-10-CM | POA: Diagnosis not present

## 2022-02-06 DIAGNOSIS — N184 Chronic kidney disease, stage 4 (severe): Secondary | ICD-10-CM | POA: Diagnosis not present

## 2022-02-06 DIAGNOSIS — E1122 Type 2 diabetes mellitus with diabetic chronic kidney disease: Secondary | ICD-10-CM | POA: Diagnosis not present

## 2022-02-06 DIAGNOSIS — E11621 Type 2 diabetes mellitus with foot ulcer: Secondary | ICD-10-CM | POA: Diagnosis not present

## 2022-02-06 DIAGNOSIS — B20 Human immunodeficiency virus [HIV] disease: Secondary | ICD-10-CM | POA: Diagnosis not present

## 2022-02-06 DIAGNOSIS — Z794 Long term (current) use of insulin: Secondary | ICD-10-CM | POA: Diagnosis not present

## 2022-02-06 DIAGNOSIS — M009 Pyogenic arthritis, unspecified: Secondary | ICD-10-CM | POA: Diagnosis not present

## 2022-02-06 DIAGNOSIS — M868X7 Other osteomyelitis, ankle and foot: Secondary | ICD-10-CM | POA: Diagnosis not present

## 2022-02-06 DIAGNOSIS — M86171 Other acute osteomyelitis, right ankle and foot: Secondary | ICD-10-CM | POA: Diagnosis not present

## 2022-02-07 DIAGNOSIS — L97519 Non-pressure chronic ulcer of other part of right foot with unspecified severity: Secondary | ICD-10-CM | POA: Diagnosis not present

## 2022-02-07 DIAGNOSIS — B20 Human immunodeficiency virus [HIV] disease: Secondary | ICD-10-CM | POA: Diagnosis not present

## 2022-02-07 DIAGNOSIS — E119 Type 2 diabetes mellitus without complications: Secondary | ICD-10-CM | POA: Diagnosis not present

## 2022-02-07 DIAGNOSIS — E11621 Type 2 diabetes mellitus with foot ulcer: Secondary | ICD-10-CM | POA: Diagnosis not present

## 2022-02-07 DIAGNOSIS — M86171 Other acute osteomyelitis, right ankle and foot: Secondary | ICD-10-CM | POA: Diagnosis not present

## 2022-02-07 DIAGNOSIS — I739 Peripheral vascular disease, unspecified: Secondary | ICD-10-CM | POA: Diagnosis not present

## 2022-02-08 DIAGNOSIS — L97519 Non-pressure chronic ulcer of other part of right foot with unspecified severity: Secondary | ICD-10-CM | POA: Diagnosis not present

## 2022-02-08 DIAGNOSIS — E1140 Type 2 diabetes mellitus with diabetic neuropathy, unspecified: Secondary | ICD-10-CM | POA: Diagnosis not present

## 2022-02-08 DIAGNOSIS — B20 Human immunodeficiency virus [HIV] disease: Secondary | ICD-10-CM | POA: Diagnosis not present

## 2022-02-08 DIAGNOSIS — Z89431 Acquired absence of right foot: Secondary | ICD-10-CM | POA: Diagnosis not present

## 2022-02-08 DIAGNOSIS — M86171 Other acute osteomyelitis, right ankle and foot: Secondary | ICD-10-CM | POA: Diagnosis not present

## 2022-02-08 DIAGNOSIS — I96 Gangrene, not elsewhere classified: Secondary | ICD-10-CM | POA: Diagnosis not present

## 2022-02-08 DIAGNOSIS — E11621 Type 2 diabetes mellitus with foot ulcer: Secondary | ICD-10-CM | POA: Diagnosis not present

## 2022-02-09 DIAGNOSIS — Z7984 Long term (current) use of oral hypoglycemic drugs: Secondary | ICD-10-CM | POA: Diagnosis not present

## 2022-02-09 DIAGNOSIS — M868X0 Other osteomyelitis, multiple sites: Secondary | ICD-10-CM | POA: Diagnosis not present

## 2022-02-09 DIAGNOSIS — L97518 Non-pressure chronic ulcer of other part of right foot with other specified severity: Secondary | ICD-10-CM | POA: Diagnosis not present

## 2022-02-09 DIAGNOSIS — E11621 Type 2 diabetes mellitus with foot ulcer: Secondary | ICD-10-CM | POA: Diagnosis not present

## 2022-02-09 DIAGNOSIS — F39 Unspecified mood [affective] disorder: Secondary | ICD-10-CM | POA: Diagnosis not present

## 2022-02-09 DIAGNOSIS — Z21 Asymptomatic human immunodeficiency virus [HIV] infection status: Secondary | ICD-10-CM | POA: Diagnosis not present

## 2022-02-09 DIAGNOSIS — E1140 Type 2 diabetes mellitus with diabetic neuropathy, unspecified: Secondary | ICD-10-CM | POA: Diagnosis not present

## 2022-02-09 DIAGNOSIS — Z79899 Other long term (current) drug therapy: Secondary | ICD-10-CM | POA: Diagnosis not present

## 2022-02-10 DIAGNOSIS — E11621 Type 2 diabetes mellitus with foot ulcer: Secondary | ICD-10-CM | POA: Diagnosis not present

## 2022-02-10 DIAGNOSIS — E1142 Type 2 diabetes mellitus with diabetic polyneuropathy: Secondary | ICD-10-CM | POA: Diagnosis not present

## 2022-02-10 DIAGNOSIS — F39 Unspecified mood [affective] disorder: Secondary | ICD-10-CM | POA: Diagnosis not present

## 2022-02-10 DIAGNOSIS — Z7984 Long term (current) use of oral hypoglycemic drugs: Secondary | ICD-10-CM | POA: Diagnosis not present

## 2022-02-10 DIAGNOSIS — M86171 Other acute osteomyelitis, right ankle and foot: Secondary | ICD-10-CM | POA: Diagnosis not present

## 2022-02-10 DIAGNOSIS — B20 Human immunodeficiency virus [HIV] disease: Secondary | ICD-10-CM | POA: Diagnosis not present

## 2022-02-10 DIAGNOSIS — Z89421 Acquired absence of other right toe(s): Secondary | ICD-10-CM | POA: Diagnosis not present

## 2022-02-10 DIAGNOSIS — L97519 Non-pressure chronic ulcer of other part of right foot with unspecified severity: Secondary | ICD-10-CM | POA: Diagnosis not present

## 2022-02-11 DIAGNOSIS — E11621 Type 2 diabetes mellitus with foot ulcer: Secondary | ICD-10-CM | POA: Diagnosis not present

## 2022-02-11 DIAGNOSIS — E1140 Type 2 diabetes mellitus with diabetic neuropathy, unspecified: Secondary | ICD-10-CM | POA: Diagnosis not present

## 2022-02-11 DIAGNOSIS — M86171 Other acute osteomyelitis, right ankle and foot: Secondary | ICD-10-CM | POA: Diagnosis not present

## 2022-02-11 DIAGNOSIS — Z89421 Acquired absence of other right toe(s): Secondary | ICD-10-CM | POA: Diagnosis not present

## 2022-02-11 DIAGNOSIS — B20 Human immunodeficiency virus [HIV] disease: Secondary | ICD-10-CM | POA: Diagnosis not present

## 2022-02-11 DIAGNOSIS — Z419 Encounter for procedure for purposes other than remedying health state, unspecified: Secondary | ICD-10-CM | POA: Diagnosis not present

## 2022-02-11 DIAGNOSIS — L97519 Non-pressure chronic ulcer of other part of right foot with unspecified severity: Secondary | ICD-10-CM | POA: Diagnosis not present

## 2022-02-12 DIAGNOSIS — E1140 Type 2 diabetes mellitus with diabetic neuropathy, unspecified: Secondary | ICD-10-CM | POA: Diagnosis not present

## 2022-02-12 DIAGNOSIS — B20 Human immunodeficiency virus [HIV] disease: Secondary | ICD-10-CM | POA: Diagnosis not present

## 2022-02-12 DIAGNOSIS — L97519 Non-pressure chronic ulcer of other part of right foot with unspecified severity: Secondary | ICD-10-CM | POA: Diagnosis not present

## 2022-02-12 DIAGNOSIS — E11621 Type 2 diabetes mellitus with foot ulcer: Secondary | ICD-10-CM | POA: Diagnosis not present

## 2022-02-12 DIAGNOSIS — Z89421 Acquired absence of other right toe(s): Secondary | ICD-10-CM | POA: Diagnosis not present

## 2022-02-12 DIAGNOSIS — M86171 Other acute osteomyelitis, right ankle and foot: Secondary | ICD-10-CM | POA: Diagnosis not present

## 2022-02-13 DIAGNOSIS — L97519 Non-pressure chronic ulcer of other part of right foot with unspecified severity: Secondary | ICD-10-CM | POA: Diagnosis not present

## 2022-02-13 DIAGNOSIS — M86171 Other acute osteomyelitis, right ankle and foot: Secondary | ICD-10-CM | POA: Diagnosis not present

## 2022-02-13 DIAGNOSIS — B20 Human immunodeficiency virus [HIV] disease: Secondary | ICD-10-CM | POA: Diagnosis not present

## 2022-02-13 DIAGNOSIS — Z89421 Acquired absence of other right toe(s): Secondary | ICD-10-CM | POA: Diagnosis not present

## 2022-02-13 DIAGNOSIS — E11621 Type 2 diabetes mellitus with foot ulcer: Secondary | ICD-10-CM | POA: Diagnosis not present

## 2022-02-13 DIAGNOSIS — E1140 Type 2 diabetes mellitus with diabetic neuropathy, unspecified: Secondary | ICD-10-CM | POA: Diagnosis not present

## 2022-02-14 DIAGNOSIS — E11621 Type 2 diabetes mellitus with foot ulcer: Secondary | ICD-10-CM | POA: Diagnosis not present

## 2022-02-14 DIAGNOSIS — M86171 Other acute osteomyelitis, right ankle and foot: Secondary | ICD-10-CM | POA: Diagnosis not present

## 2022-02-14 DIAGNOSIS — B20 Human immunodeficiency virus [HIV] disease: Secondary | ICD-10-CM | POA: Diagnosis not present

## 2022-02-14 DIAGNOSIS — L97519 Non-pressure chronic ulcer of other part of right foot with unspecified severity: Secondary | ICD-10-CM | POA: Diagnosis not present

## 2022-02-14 DIAGNOSIS — Z89421 Acquired absence of other right toe(s): Secondary | ICD-10-CM | POA: Diagnosis not present

## 2022-02-14 DIAGNOSIS — E1140 Type 2 diabetes mellitus with diabetic neuropathy, unspecified: Secondary | ICD-10-CM | POA: Diagnosis not present

## 2022-02-15 DIAGNOSIS — Z89421 Acquired absence of other right toe(s): Secondary | ICD-10-CM | POA: Diagnosis not present

## 2022-02-15 DIAGNOSIS — B9562 Methicillin resistant Staphylococcus aureus infection as the cause of diseases classified elsewhere: Secondary | ICD-10-CM | POA: Diagnosis not present

## 2022-02-15 DIAGNOSIS — Z79899 Other long term (current) drug therapy: Secondary | ICD-10-CM | POA: Diagnosis not present

## 2022-02-15 DIAGNOSIS — M868X7 Other osteomyelitis, ankle and foot: Secondary | ICD-10-CM | POA: Diagnosis not present

## 2022-02-15 DIAGNOSIS — B20 Human immunodeficiency virus [HIV] disease: Secondary | ICD-10-CM | POA: Diagnosis not present

## 2022-02-16 DIAGNOSIS — E11621 Type 2 diabetes mellitus with foot ulcer: Secondary | ICD-10-CM | POA: Diagnosis not present

## 2022-02-16 DIAGNOSIS — M868X7 Other osteomyelitis, ankle and foot: Secondary | ICD-10-CM | POA: Diagnosis not present

## 2022-02-16 DIAGNOSIS — L97519 Non-pressure chronic ulcer of other part of right foot with unspecified severity: Secondary | ICD-10-CM | POA: Diagnosis not present

## 2022-02-16 DIAGNOSIS — Z79899 Other long term (current) drug therapy: Secondary | ICD-10-CM | POA: Diagnosis not present

## 2022-02-16 DIAGNOSIS — Z7984 Long term (current) use of oral hypoglycemic drugs: Secondary | ICD-10-CM | POA: Diagnosis not present

## 2022-02-16 DIAGNOSIS — Z89421 Acquired absence of other right toe(s): Secondary | ICD-10-CM | POA: Diagnosis not present

## 2022-02-16 DIAGNOSIS — B9562 Methicillin resistant Staphylococcus aureus infection as the cause of diseases classified elsewhere: Secondary | ICD-10-CM | POA: Diagnosis not present

## 2022-02-16 DIAGNOSIS — B20 Human immunodeficiency virus [HIV] disease: Secondary | ICD-10-CM | POA: Diagnosis not present

## 2022-02-16 DIAGNOSIS — M86171 Other acute osteomyelitis, right ankle and foot: Secondary | ICD-10-CM | POA: Diagnosis not present

## 2022-03-13 DIAGNOSIS — Z419 Encounter for procedure for purposes other than remedying health state, unspecified: Secondary | ICD-10-CM | POA: Diagnosis not present

## 2022-03-24 ENCOUNTER — Other Ambulatory Visit: Payer: Self-pay

## 2022-03-24 ENCOUNTER — Other Ambulatory Visit (HOSPITAL_COMMUNITY): Payer: Self-pay

## 2022-03-24 ENCOUNTER — Other Ambulatory Visit (INDEPENDENT_AMBULATORY_CARE_PROVIDER_SITE_OTHER): Payer: Self-pay | Admitting: Family Medicine

## 2022-03-24 DIAGNOSIS — E119 Type 2 diabetes mellitus without complications: Secondary | ICD-10-CM

## 2022-03-24 DIAGNOSIS — Z794 Long term (current) use of insulin: Secondary | ICD-10-CM

## 2022-03-24 MED ORDER — ONETOUCH DELICA PLUS LANCING MISC
0 refills | Status: AC
  Filled 2022-03-28 (×2): qty 1, 30d supply, fill #0

## 2022-03-25 ENCOUNTER — Other Ambulatory Visit: Payer: Self-pay

## 2022-03-28 ENCOUNTER — Other Ambulatory Visit: Payer: Self-pay

## 2022-04-04 ENCOUNTER — Other Ambulatory Visit: Payer: Self-pay

## 2022-04-13 DIAGNOSIS — Z419 Encounter for procedure for purposes other than remedying health state, unspecified: Secondary | ICD-10-CM | POA: Diagnosis not present

## 2022-05-13 DIAGNOSIS — Z419 Encounter for procedure for purposes other than remedying health state, unspecified: Secondary | ICD-10-CM | POA: Diagnosis not present

## 2022-05-27 ENCOUNTER — Other Ambulatory Visit (HOSPITAL_COMMUNITY): Payer: Self-pay

## 2022-05-27 ENCOUNTER — Other Ambulatory Visit: Payer: Self-pay

## 2022-05-27 MED ORDER — DOXYCYCLINE HYCLATE 100 MG PO CAPS
100.0000 mg | ORAL_CAPSULE | Freq: Two times a day (BID) | ORAL | 0 refills | Status: DC
  Filled 2022-05-27: qty 20, 10d supply, fill #0

## 2022-05-30 ENCOUNTER — Other Ambulatory Visit (HOSPITAL_COMMUNITY): Payer: Self-pay

## 2022-05-30 MED ORDER — AMOXICILLIN-POT CLAVULANATE 875-125 MG PO TABS
1.0000 | ORAL_TABLET | Freq: Two times a day (BID) | ORAL | 0 refills | Status: DC
  Filled 2022-05-30: qty 20, 10d supply, fill #0

## 2022-06-13 DIAGNOSIS — Z419 Encounter for procedure for purposes other than remedying health state, unspecified: Secondary | ICD-10-CM | POA: Diagnosis not present

## 2022-06-16 DIAGNOSIS — L97512 Non-pressure chronic ulcer of other part of right foot with fat layer exposed: Secondary | ICD-10-CM | POA: Diagnosis not present

## 2022-06-16 DIAGNOSIS — Z89421 Acquired absence of other right toe(s): Secondary | ICD-10-CM | POA: Diagnosis not present

## 2022-06-16 DIAGNOSIS — M869 Osteomyelitis, unspecified: Secondary | ICD-10-CM | POA: Diagnosis not present

## 2022-06-23 DIAGNOSIS — M86171 Other acute osteomyelitis, right ankle and foot: Secondary | ICD-10-CM | POA: Diagnosis not present

## 2022-06-23 DIAGNOSIS — E11621 Type 2 diabetes mellitus with foot ulcer: Secondary | ICD-10-CM | POA: Diagnosis not present

## 2022-06-23 DIAGNOSIS — L97412 Non-pressure chronic ulcer of right heel and midfoot with fat layer exposed: Secondary | ICD-10-CM | POA: Diagnosis not present

## 2022-06-23 DIAGNOSIS — Z7689 Persons encountering health services in other specified circumstances: Secondary | ICD-10-CM | POA: Diagnosis not present

## 2022-06-23 DIAGNOSIS — B951 Streptococcus, group B, as the cause of diseases classified elsewhere: Secondary | ICD-10-CM | POA: Diagnosis not present

## 2022-06-23 DIAGNOSIS — Z89421 Acquired absence of other right toe(s): Secondary | ICD-10-CM | POA: Diagnosis not present

## 2022-06-23 DIAGNOSIS — E1169 Type 2 diabetes mellitus with other specified complication: Secondary | ICD-10-CM | POA: Diagnosis not present

## 2022-06-23 DIAGNOSIS — E1142 Type 2 diabetes mellitus with diabetic polyneuropathy: Secondary | ICD-10-CM | POA: Diagnosis not present

## 2022-06-23 DIAGNOSIS — I10 Essential (primary) hypertension: Secondary | ICD-10-CM | POA: Diagnosis not present

## 2022-06-23 DIAGNOSIS — B9562 Methicillin resistant Staphylococcus aureus infection as the cause of diseases classified elsewhere: Secondary | ICD-10-CM | POA: Diagnosis not present

## 2022-06-23 DIAGNOSIS — Z794 Long term (current) use of insulin: Secondary | ICD-10-CM | POA: Diagnosis not present

## 2022-06-23 DIAGNOSIS — L97512 Non-pressure chronic ulcer of other part of right foot with fat layer exposed: Secondary | ICD-10-CM | POA: Diagnosis not present

## 2022-06-25 DIAGNOSIS — Z7689 Persons encountering health services in other specified circumstances: Secondary | ICD-10-CM | POA: Diagnosis not present

## 2022-06-29 ENCOUNTER — Ambulatory Visit: Payer: Self-pay

## 2022-06-29 DIAGNOSIS — L97519 Non-pressure chronic ulcer of other part of right foot with unspecified severity: Secondary | ICD-10-CM | POA: Diagnosis not present

## 2022-06-29 NOTE — Telephone Encounter (Signed)
Patient states that their blood sugar has been over 300. Patient is sluggish and thirsty,  next available appointment is 07/18/2022. Patient is needing advise.     Chief Complaint: Blood glucose running over 300 . Asking to be worked in. Symptoms: Fatigue, thirst Frequency: Weeks Pertinent Negatives: Patient denies  Disposition: [] ED /[] Urgent Care (no appt availability in office) / [] Appointment(In office/virtual)/ []  [place] [facility]/ [] Home Care/ [] Refused Recommended Disposition /[] [place] [facility]/ [x]  Follow-up with PCP Additional Notes: Please advise pt.  Answer Assessment - Initial Assessment Questions 1. BLOOD GLUCOSE: "What is your blood glucose level?"      300 2. ONSET: "When did you check the blood glucose?"     Weeks 3. USUAL RANGE: "What is your glucose level usually?" (e.g., usual fasting morning value, usual evening value)     120-160 4. KETONES: "Do you check for ketones (urine or blood test strips)?" If Yes, ask: "What does the test show now?"      No 5. TYPE 1 or 2:  "Do you know what type of diabetes you have?"  (e.g., Type 1, Type 2, Gestational; doesn't know)      Type 2 6. INSULIN: "Do you take insulin?" "What type of insulin(s) do you use? What is the mode of delivery? (syringe, pen; injection or pump)?"      No 7. DIABETES PILLS: "Do you take any pills for your diabetes?" If Yes, ask: "Have you missed taking any pills recently?"     Yes 8. OTHER SYMPTOMS: "Do you have any symptoms?" (e.g., fever, frequent urination, difficulty breathing, dizziness, weakness, vomiting)     Thirst 9. PREGNANCY: "Is there any chance you are pregnant?" "When was your last menstrual period?"     no  Protocols used: Diabetes - High Blood Sugar-A-AH

## 2022-06-29 NOTE — Telephone Encounter (Signed)
Contacted pt and schedule an appt for 1/18 at 910am.

## 2022-06-30 ENCOUNTER — Ambulatory Visit (INDEPENDENT_AMBULATORY_CARE_PROVIDER_SITE_OTHER): Payer: Medicaid Other | Admitting: Primary Care

## 2022-06-30 ENCOUNTER — Ambulatory Visit: Payer: Medicaid Other | Attending: Primary Care | Admitting: Pharmacist

## 2022-06-30 ENCOUNTER — Encounter (INDEPENDENT_AMBULATORY_CARE_PROVIDER_SITE_OTHER): Payer: Self-pay | Admitting: Primary Care

## 2022-06-30 ENCOUNTER — Other Ambulatory Visit (INDEPENDENT_AMBULATORY_CARE_PROVIDER_SITE_OTHER): Payer: Self-pay | Admitting: Primary Care

## 2022-06-30 ENCOUNTER — Other Ambulatory Visit: Payer: Self-pay

## 2022-06-30 VITALS — BP 142/86 | HR 109 | Resp 16 | Ht 71.0 in | Wt 214.0 lb

## 2022-06-30 DIAGNOSIS — B20 Human immunodeficiency virus [HIV] disease: Secondary | ICD-10-CM | POA: Diagnosis not present

## 2022-06-30 DIAGNOSIS — E11621 Type 2 diabetes mellitus with foot ulcer: Secondary | ICD-10-CM | POA: Diagnosis not present

## 2022-06-30 DIAGNOSIS — Z1211 Encounter for screening for malignant neoplasm of colon: Secondary | ICD-10-CM | POA: Diagnosis not present

## 2022-06-30 DIAGNOSIS — E1142 Type 2 diabetes mellitus with diabetic polyneuropathy: Secondary | ICD-10-CM

## 2022-06-30 DIAGNOSIS — L97412 Non-pressure chronic ulcer of right heel and midfoot with fat layer exposed: Secondary | ICD-10-CM

## 2022-06-30 DIAGNOSIS — I639 Cerebral infarction, unspecified: Secondary | ICD-10-CM

## 2022-06-30 DIAGNOSIS — E119 Type 2 diabetes mellitus without complications: Secondary | ICD-10-CM | POA: Diagnosis not present

## 2022-06-30 DIAGNOSIS — Z794 Long term (current) use of insulin: Secondary | ICD-10-CM

## 2022-06-30 DIAGNOSIS — F418 Other specified anxiety disorders: Secondary | ICD-10-CM

## 2022-06-30 DIAGNOSIS — Z760 Encounter for issue of repeat prescription: Secondary | ICD-10-CM

## 2022-06-30 DIAGNOSIS — I10 Essential (primary) hypertension: Secondary | ICD-10-CM | POA: Diagnosis not present

## 2022-06-30 LAB — GLUCOSE, POCT (MANUAL RESULT ENTRY): POC Glucose: 180 mg/dl — AB (ref 70–99)

## 2022-06-30 MED ORDER — TRULICITY 0.75 MG/0.5ML ~~LOC~~ SOAJ
0.7500 mg | SUBCUTANEOUS | 3 refills | Status: DC
  Filled 2022-06-30: qty 0.5, 7d supply, fill #0

## 2022-06-30 MED ORDER — LISINOPRIL 20 MG PO TABS
20.0000 mg | ORAL_TABLET | Freq: Every day | ORAL | 1 refills | Status: DC
  Filled 2022-06-30: qty 30, 30d supply, fill #0
  Filled 2022-08-04: qty 30, 30d supply, fill #1

## 2022-06-30 MED ORDER — ESCITALOPRAM OXALATE 20 MG PO TABS
ORAL_TABLET | Freq: Every day | ORAL | 1 refills | Status: DC
  Filled 2022-06-30: qty 30, 30d supply, fill #0
  Filled 2022-08-04: qty 30, 30d supply, fill #1
  Filled 2022-09-16: qty 30, 30d supply, fill #0
  Filled 2022-09-16: qty 30, 30d supply, fill #2

## 2022-06-30 MED ORDER — TRULICITY 0.75 MG/0.5ML ~~LOC~~ SOAJ
0.7500 mg | SUBCUTANEOUS | 3 refills | Status: DC
  Filled 2022-06-30: qty 2, 28d supply, fill #0
  Filled 2022-07-28: qty 2, 28d supply, fill #1

## 2022-06-30 MED ORDER — GLIPIZIDE 10 MG PO TABS
ORAL_TABLET | ORAL | 1 refills | Status: DC
  Filled 2022-06-30: qty 60, 30d supply, fill #0
  Filled 2022-08-04: qty 60, 30d supply, fill #1
  Filled 2022-09-16: qty 60, 30d supply, fill #2
  Filled 2022-09-16: qty 60, 30d supply, fill #0

## 2022-06-30 MED ORDER — METFORMIN HCL 1000 MG PO TABS
1000.0000 mg | ORAL_TABLET | Freq: Two times a day (BID) | ORAL | 1 refills | Status: DC
  Filled 2022-06-30: qty 60, 30d supply, fill #0
  Filled 2022-08-04: qty 60, 30d supply, fill #1
  Filled 2022-09-16: qty 60, 30d supply, fill #2
  Filled 2022-09-16: qty 60, 30d supply, fill #0

## 2022-06-30 NOTE — Progress Notes (Signed)
    S:     PCP: [name]  52 y.o. adult who presents for diabetes evaluation, education, and management. 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   Patient was last seen by Primary Care Provider, [name] today.  At last visit earlier today, patient reported to have stopped taking her medications because of deep depression. She was restarted on her home medications and instructed to follow up with the pharmacist.  Today, patient arrives in good spirits and presents without any assistance. Patient reports restarting her metformin and glipizide on Friday, 06/24/2022. She denied regularly checking her BG at home. She moved in with her sister in [place] who is a Marine scientist. She has been helping her manage her health.   Family/Social History:  -Fhx: HF, DM -Tobacco: former; quit in 2020 -Alcohol: denies  Current diabetes medications include: Trulicity 0.75mg  once weekly (not taking), glipizide 10mg  BID, metformin 1000mg  BID   Insurance coverage: [place] [employer] & [employer]  Patient denies hypoglycemic events.  Reported home fasting blood sugars: 180 this AM; reported 360 last week  Patient denies nocturia (nighttime urination).  Patient denies neuropathy (nerve pain). Patient reports visual changes. Patient reports self foot exams.   Patient reported dietary habits: -drinks diet sodas  -stopped eating sweets last Friday  Patient-reported exercise habits:  -limited by her amputation   O:  Lab Results  Component Value Date   HGBA1C 11.4 (H) 06/30/2022   There were no vitals filed for this visit.  Lipid Panel     Component Value Date/Time   CHOL 144 06/30/2022 1419   TRIG 125 06/30/2022 1419   HDL 29 (L) 06/30/2022 1419   CHOLHDL 5.0 06/30/2022 1419   CHOLHDL 5.4 (H) 04/13/2020 0910   VLDL 47 (H) 11/01/2017 0334   LDLCALC 92 06/30/2022 1419   LDLCALC 71  04/13/2020 0910    Clinical Atherosclerotic Cardiovascular Disease (ASCVD): Yes  The ASCVD Risk score (Arnett DK, et al., 2019) failed to calculate for the following reasons:   The patient has a prior MI or stroke diagnosis   Patient is participating in a Managed Medicaid Plan:  Yes   A/P: Diabetes longstanding currently uncontrolled. Patient is able to verbalize appropriate hypoglycemia management plan. Medication adherence appears suboptimal. Control is suboptimal due to medication nonadherence. -Continued GLP-1 Trulicity 0.75mg  once weekly  -Continued metformin 1000mg  BID  -Continued glipizide 10mg  BID -Extensively discussed pathophysiology of diabetes, recommended lifestyle interventions, dietary effects on blood sugar control.  -Counseled on s/sx of and management of hypoglycemia.  -Next A1c anticipated April 2024.  -Follow up labs ordered: lipid profile, CMP, and A1c. Patient unable to void her bladder at this time for a UACR.   Written patient instructions provided. Patient verbalized understanding of treatment plan.  Total time in face to face counseling 25 minutes.    Follow-up:  Pharmacist in one month. PCP clinic visit in April 2024  [name], PharmD PGY-1 [facility] Pharmacy Resident

## 2022-06-30 NOTE — Progress Notes (Signed)
Subjective:  Patient ID: [name], adult    DOB: 1971-06-05  Age: 52 y.o. MRN: [id number]  CC: Diabetes and Hypertension   HPI [name] she presents for the management of type 2 diabetes and hypertension.  She has stopped taking all of her medications because circumstances and deep depression and not caring about anything including health and life.  Today she comes in and wants to start caring about herself again and taking care of herself.  She is HIV positive and has not been on medication or seen Dr. Drucilla Schmidt.  Dr. Drucilla Schmidt schedule is booked until March.  Appointment scheduled for labs on February 24 at 10:00 and follow-up with [facility] nurse practitioner February 7 at 230. Patient does not check blood sugar at home. Pertinent ROS: Polyuria - Yes , Polydipsia - No. Patient has No headache, No chest pain, No abdominal pain - No Nausea, No new weakness tingling or numbness, No Cough - shortness of breath  Vision problems - Yes  Medications as noted below. Taking them regularly without complication/adverse reaction being reported today.   History [place] has a past medical history of Anxiety, Asthma, Depression, Diabetes mellitus, Gait abnormality, GERD (gastroesophageal reflux disease), HIV disease ([place]), Hypertension, Loss of balance (09/14/2020), MRSA carrier, Perirectal abscess (03/30/2017), Pneumonia, Polyuria (04/27/2015), Poorly controlled diabetes mellitus ([place]) (04/27/2015), Testicular mass (06/20/2016), and Transgender (04/27/2015).   She has a past surgical history that includes Appendectomy and Transforaminal lumbar interbody fusion (tlif) with pedicle screw fixation 3 level (N/A, 01/09/2019).   Her family history includes Diabetes in her father; Heart failure in her father; Other in her mother.She reports that she quit smoking about 3 years ago. Her smoking use included cigarettes. She has a 2.70 pack-year smoking history. She has never used smokeless tobacco. She  reports current drug use. Drug: Marijuana. She reports that she does not drink alcohol.  Current Outpatient Medications on File Prior to Visit  Medication Sig Dispense Refill   ACCU-CHEK SOFTCLIX LANCETS lancets 1 each by Other route 3 (three) times daily. 100 each 5   amoxicillin-clavulanate (AUGMENTIN) 875-125 MG tablet Take 1 tablet by mouth 2 (two) times daily for 10 days 20 tablet 0   blood glucose meter kit and supplies Dispense based on patient and insurance preference. Use up to four times daily as directed. (FOR ICD-10 E10.9, E11.9). 1 each 0   doxycycline (VIBRAMYCIN) 100 MG capsule Take 1 capsule (100 mg total) by mouth 2 (two) times daily for 10 days 20 capsule 0   estradiol (ESTRACE) 2 MG tablet Take 1 tablet (2 mg total) by mouth daily. 30 tablet 11   Lancet Devices (ONETOUCH DELICA PLUS LANCING) MISC Use as directed 1 each 0   PROAIR HFA 108 (90 Base) MCG/ACT inhaler INHALE 2 PUFFS BY MOUTH INTO LUNGS EVERY 6 HOURS AS NEEDED FOR WHEEZING OR SHORTNESS OF BREATH 8.5 g 1   No current facility-administered medications on file prior to visit.    ROS Comprehensive ROS Pertinent positive and negative noted in HPI    Objective:  Blood Pressure (Abnormal) 142/86   Pulse (Abnormal) 109   Respiration 16   Height 5\' 11"  (1.803 m)   Weight 214 lb (97.1 kg)   Oxygen Saturation 98%   Body Mass Index 29.85 kg/m   BP Readings from Last 3 Encounters:  06/30/22 (Abnormal) 142/86  01/27/21 (Abnormal) 150/96  12/08/20 108/74    Wt Readings from Last 3 Encounters:  06/30/22 214 lb (97.1 kg)  01/27/21 213 lb (96.6 kg)  12/08/20 204 lb (92.5 kg)   Physical exam: General: Vital signs reviewed.  Patient is well-developed and well-nourished, male in no acute distress and cooperative with exam. Head: Normocephalic and atraumatic. Eyes: EOMI, conjunctivae normal, no scleral icterus. Neck: Supple, trachea midline, normal ROM, no JVD, masses, thyromegaly, or carotid bruit  present. Cardiovascular: RRR, S1 normal, S2 normal, no murmurs, gallops, or rubs. Pulmonary/Chest: Clear to auscultation bilaterally, no wheezes, rales, or rhonchi. Abdominal: Soft, non-tender, non-distended, BS +, no masses, organomegaly, or guarding present. Musculoskeletal: No joint deformities, erythema, or stiffness, ROM full and nontender. Extremities: No lower extremity edema bilaterally,  pulses symmetric and intact bilaterally. No cyanosis or clubbing. Neurological: A&O x3, Strength is normal Skin: Warm, dry and intact. No rashes or erythema. Psychiatric: Normal mood and affect. speech and behavior is normal. Cognition and memory are normal.    Lab Results  Component Value Date   HGBA1C 8.8 (A) 12/08/2020   HGBA1C 10.1 (A) 08/18/2020   HGBA1C 9.8 (A) 03/23/2020    Lab Results  Component Value Date   WBC 9.1 01/27/2021   HGB 16.0 01/27/2021   HCT 47.0 01/27/2021   PLT 279 01/27/2021   GLUCOSE 142 (H) 01/27/2021   CHOL 134 04/13/2020   TRIG 313 (H) 04/13/2020   HDL 25 (L) 04/13/2020   LDLCALC 71 04/13/2020   ALT 13 01/27/2021   AST 15 01/27/2021   NA 138 01/27/2021   K 3.7 01/27/2021   CL 102 01/27/2021   CREATININE 0.63 (L) 01/27/2021   BUN 8 01/27/2021   CO2 28 01/27/2021   TSH 1.090 12/06/2017   INR 1.1 08/27/2019   HGBA1C 8.8 (A) 12/08/2020   MICROALBUR 2.50 (H) 07/20/2011     Assessment & Plan:  Sang was seen today for diabetes and hypertension.  Diagnoses and all orders for this visit:  Type 2 diabetes mellitus without complication, with long-term current use of insulin (HCC) -     POCT glucose (manual entry) -     Ambulatory referral to Ophthalmology -     glipiZIDE (GLUCOTROL) 10 MG tablet; TAKE 1 TABLET BY MOUTH 2 TIMES DAILY BEFORE MEALS  Essential hypertension BP goal - < 130/80 Explained that having normal blood pressure is the goal and medications are helping to get to goal and maintain normal blood pressure. DIET: Limit salt intake, read  nutrition labels to check salt content, limit fried and high fatty foods  Avoid using multisymptom OTC cold preparations that generally contain sudafed which can rise BP. Consult with pharmacist on best cold relief products to use for persons with HTN EXERCISE Discussed incorporating exercise such as walking - 30 minutes most days of the week and can do in 10 minute intervals     Depression with anxiety Meet with clinical social worker will schedule f/u appt -     escitalopram (LEXAPRO) 20 MG tablet; TAKE 1 TABLET BY MOUTH AT BEDTIME  Diabetic ulcer of right midfoot associated with type 2 diabetes mellitus, with fat layer exposed (HCC) Recent amputation of  Ulcer overlying the lateral foot superficial to the residual fifth metatarsal   Human immunodeficiency virus (HIV) disease (HCC) Scheduled while patient was present -     Ambulatory referral to Infectious Disease  Colon cancer screening -     Fecal occult blood, imunochemical; Future  Other orders/Medication refill -     escitalopram (LEXAPRO) 20 MG tablet; TAKE 1 TABLET BY MOUTH AT BEDTIME -  glipiZIDE (GLUCOTROL) 10 MG tablet; TAKE 1 TABLET BY MOUTH 2 TIMES DAILY BEFORE MEALS   Dulaglutide (TRULICITY) 1.93 XT/0.2IO SOPN; Inject 0.75 mg into the skin once a week. -     metFORMIN (GLUCOPHAGE) 1000 MG tablet; Take 1 tablet (1,000 mg total) by mouth 2 (two) times daily with a meal.       Follow-up:   No follow-ups on file.  The above assessment and management plan was discussed with the patient. The patient verbalized understanding of and has agreed to the management plan. Patient is aware to call the clinic if symptoms fail to improve or worsen. Patient is aware when to return to the clinic for a follow-up visit. Patient educated on when it is appropriate to go to the emergency department.   Juluis Mire, NP-C

## 2022-07-01 DIAGNOSIS — M86171 Other acute osteomyelitis, right ankle and foot: Secondary | ICD-10-CM | POA: Diagnosis not present

## 2022-07-01 LAB — LIPID PANEL
Chol/HDL Ratio: 5 ratio (ref 0.0–5.0)
Cholesterol, Total: 144 mg/dL (ref 100–199)
HDL: 29 mg/dL — ABNORMAL LOW (ref >39)
LDL Chol Calc (NIH): 92 mg/dL (ref 0–99)
Triglycerides: 125 mg/dL (ref 0–149)
VLDL Cholesterol Cal: 23 mg/dL (ref 5–40)

## 2022-07-01 LAB — CMP14+EGFR
ALT: 13 IU/L (ref 0–44)
AST: 18 IU/L (ref 0–40)
Albumin/Globulin Ratio: 0.9 — ABNORMAL LOW (ref 1.2–2.2)
Albumin: 3.9 g/dL (ref 3.8–4.9)
Alkaline Phosphatase: 124 IU/L — ABNORMAL HIGH (ref 44–121)
BUN/Creatinine Ratio: 20 (ref 9–20)
BUN: 15 mg/dL (ref 6–24)
Bilirubin Total: 0.3 mg/dL (ref 0.0–1.2)
CO2: 24 mmol/L (ref 20–29)
Calcium: 9.5 mg/dL (ref 8.7–10.2)
Chloride: 96 mmol/L (ref 96–106)
Creatinine, Ser: 0.76 mg/dL (ref 0.76–1.27)
Globulin, Total: 4.5 g/dL (ref 1.5–4.5)
Glucose: 266 mg/dL — ABNORMAL HIGH (ref 70–99)
Potassium: 4.9 mmol/L (ref 3.5–5.2)
Sodium: 135 mmol/L (ref 134–144)
Total Protein: 8.4 g/dL (ref 6.0–8.5)
eGFR: 108 mL/min/{1.73_m2} (ref >59)

## 2022-07-01 LAB — HEMOGLOBIN A1C
Est. average glucose Bld gHb Est-mCnc: 280 mg/dL
Hgb A1c MFr Bld: 11.4 % — ABNORMAL HIGH (ref 4.8–5.6)

## 2022-07-04 DIAGNOSIS — Z21 Asymptomatic human immunodeficiency virus [HIV] infection status: Secondary | ICD-10-CM | POA: Diagnosis not present

## 2022-07-04 DIAGNOSIS — I10 Essential (primary) hypertension: Secondary | ICD-10-CM | POA: Diagnosis not present

## 2022-07-04 DIAGNOSIS — E119 Type 2 diabetes mellitus without complications: Secondary | ICD-10-CM | POA: Diagnosis not present

## 2022-07-04 DIAGNOSIS — J45909 Unspecified asthma, uncomplicated: Secondary | ICD-10-CM | POA: Diagnosis not present

## 2022-07-05 ENCOUNTER — Other Ambulatory Visit (HOSPITAL_COMMUNITY): Payer: Self-pay

## 2022-07-05 ENCOUNTER — Other Ambulatory Visit: Payer: Self-pay

## 2022-07-05 DIAGNOSIS — I10 Essential (primary) hypertension: Secondary | ICD-10-CM | POA: Diagnosis not present

## 2022-07-05 DIAGNOSIS — F32A Depression, unspecified: Secondary | ICD-10-CM | POA: Diagnosis not present

## 2022-07-05 DIAGNOSIS — M869 Osteomyelitis, unspecified: Secondary | ICD-10-CM | POA: Diagnosis not present

## 2022-07-05 DIAGNOSIS — B20 Human immunodeficiency virus [HIV] disease: Secondary | ICD-10-CM

## 2022-07-05 DIAGNOSIS — J45909 Unspecified asthma, uncomplicated: Secondary | ICD-10-CM | POA: Diagnosis not present

## 2022-07-05 DIAGNOSIS — M86171 Other acute osteomyelitis, right ankle and foot: Secondary | ICD-10-CM | POA: Diagnosis not present

## 2022-07-05 DIAGNOSIS — Z7984 Long term (current) use of oral hypoglycemic drugs: Secondary | ICD-10-CM | POA: Diagnosis not present

## 2022-07-05 DIAGNOSIS — F419 Anxiety disorder, unspecified: Secondary | ICD-10-CM | POA: Diagnosis not present

## 2022-07-05 DIAGNOSIS — Z79624 Long term (current) use of inhibitors of nucleotide synthesis: Secondary | ICD-10-CM | POA: Diagnosis not present

## 2022-07-05 DIAGNOSIS — F17210 Nicotine dependence, cigarettes, uncomplicated: Secondary | ICD-10-CM | POA: Diagnosis not present

## 2022-07-05 DIAGNOSIS — K219 Gastro-esophageal reflux disease without esophagitis: Secondary | ICD-10-CM | POA: Diagnosis not present

## 2022-07-05 DIAGNOSIS — E785 Hyperlipidemia, unspecified: Secondary | ICD-10-CM | POA: Diagnosis not present

## 2022-07-05 DIAGNOSIS — Z113 Encounter for screening for infections with a predominantly sexual mode of transmission: Secondary | ICD-10-CM

## 2022-07-05 DIAGNOSIS — E1165 Type 2 diabetes mellitus with hyperglycemia: Secondary | ICD-10-CM | POA: Diagnosis not present

## 2022-07-05 DIAGNOSIS — E1140 Type 2 diabetes mellitus with diabetic neuropathy, unspecified: Secondary | ICD-10-CM | POA: Diagnosis not present

## 2022-07-05 MED ORDER — COLACE CLEAR 50 MG PO CAPS: ORAL_CAPSULE | ORAL | 0 refills | Status: DC

## 2022-07-05 MED ORDER — ONDANSETRON 4 MG PO TBDP
4.0000 mg | ORAL_TABLET | Freq: Three times a day (TID) | ORAL | 0 refills | Status: DC | PRN
  Filled 2022-07-05: qty 18, 21d supply, fill #0

## 2022-07-05 MED ORDER — OXYCODONE HCL 5 MG PO TABS
5.0000 mg | ORAL_TABLET | Freq: Four times a day (QID) | ORAL | 0 refills | Status: DC | PRN
  Filled 2022-07-05: qty 28, 7d supply, fill #0

## 2022-07-06 ENCOUNTER — Other Ambulatory Visit: Payer: Self-pay

## 2022-07-06 ENCOUNTER — Other Ambulatory Visit: Payer: Medicaid Other

## 2022-07-06 DIAGNOSIS — Z113 Encounter for screening for infections with a predominantly sexual mode of transmission: Secondary | ICD-10-CM | POA: Diagnosis not present

## 2022-07-06 DIAGNOSIS — B20 Human immunodeficiency virus [HIV] disease: Secondary | ICD-10-CM

## 2022-07-07 LAB — T-HELPER CELL (CD4) - (RCID CLINIC ONLY)
CD4 % Helper T Cell: 38 % (ref 33–65)
CD4 T Cell Abs: 1357 /uL (ref 400–1790)

## 2022-07-09 LAB — COMPLETE METABOLIC PANEL WITH GFR
AG Ratio: 1 (calc) (ref 1.0–2.5)
ALT: 13 U/L (ref 9–46)
AST: 15 U/L (ref 10–35)
Albumin: 4 g/dL (ref 3.6–5.1)
Alkaline phosphatase (APISO): 97 U/L (ref 35–144)
BUN: 21 mg/dL (ref 7–25)
CO2: 27 mmol/L (ref 20–32)
Calcium: 9.4 mg/dL (ref 8.6–10.3)
Chloride: 101 mmol/L (ref 98–110)
Creat: 0.71 mg/dL (ref 0.70–1.30)
Globulin: 4.1 g/dL (calc) — ABNORMAL HIGH (ref 1.9–3.7)
Glucose, Bld: 127 mg/dL — ABNORMAL HIGH (ref 65–99)
Potassium: 4.9 mmol/L (ref 3.5–5.3)
Sodium: 138 mmol/L (ref 135–146)
Total Bilirubin: 0.4 mg/dL (ref 0.2–1.2)
Total Protein: 8.1 g/dL (ref 6.1–8.1)
eGFR: 110 mL/min/{1.73_m2} (ref >=60)

## 2022-07-09 LAB — CBC WITH DIFFERENTIAL/PLATELET
Absolute Monocytes: 1156 cells/uL — ABNORMAL HIGH (ref 200–950)
Basophils Absolute: 64 cells/uL (ref 0–200)
Basophils Relative: 0.6 %
Eosinophils Absolute: 182 cells/uL (ref 15–500)
Eosinophils Relative: 1.7 %
HCT: 45.9 % (ref 38.5–50.0)
Hemoglobin: 16.1 g/dL (ref 13.2–17.1)
Lymphs Abs: 3948 cells/uL — ABNORMAL HIGH (ref 850–3900)
MCH: 30.9 pg (ref 27.0–33.0)
MCHC: 35.1 g/dL (ref 32.0–36.0)
MCV: 88.1 fL (ref 80.0–100.0)
MPV: 9.8 fL (ref 7.5–12.5)
Monocytes Relative: 10.8 %
Neutro Abs: 5350 cells/uL (ref 1500–7800)
Neutrophils Relative %: 50 %
Platelets: 464 10*3/uL — ABNORMAL HIGH (ref 140–400)
RBC: 5.21 10*6/uL (ref 4.20–5.80)
RDW: 12 % (ref 11.0–15.0)
Total Lymphocyte: 36.9 %
WBC: 10.7 10*3/uL (ref 3.8–10.8)

## 2022-07-09 LAB — RPR: RPR Ser Ql: NONREACTIVE

## 2022-07-09 LAB — HIV-1 RNA QUANT-NO REFLEX-BLD
HIV 1 RNA Quant: 158 Copies/mL — ABNORMAL HIGH
HIV-1 RNA Quant, Log: 2.2 Log cps/mL — ABNORMAL HIGH

## 2022-07-11 DIAGNOSIS — M86671 Other chronic osteomyelitis, right ankle and foot: Secondary | ICD-10-CM | POA: Diagnosis not present

## 2022-07-11 DIAGNOSIS — E119 Type 2 diabetes mellitus without complications: Secondary | ICD-10-CM | POA: Diagnosis not present

## 2022-07-13 DIAGNOSIS — M86171 Other acute osteomyelitis, right ankle and foot: Secondary | ICD-10-CM | POA: Diagnosis not present

## 2022-07-14 ENCOUNTER — Other Ambulatory Visit (HOSPITAL_COMMUNITY): Payer: Self-pay

## 2022-07-14 DIAGNOSIS — L97519 Non-pressure chronic ulcer of other part of right foot with unspecified severity: Secondary | ICD-10-CM | POA: Diagnosis not present

## 2022-07-14 DIAGNOSIS — Z419 Encounter for procedure for purposes other than remedying health state, unspecified: Secondary | ICD-10-CM | POA: Diagnosis not present

## 2022-07-14 DIAGNOSIS — Z452 Encounter for adjustment and management of vascular access device: Secondary | ICD-10-CM | POA: Diagnosis not present

## 2022-07-14 DIAGNOSIS — E1142 Type 2 diabetes mellitus with diabetic polyneuropathy: Secondary | ICD-10-CM | POA: Diagnosis not present

## 2022-07-20 ENCOUNTER — Ambulatory Visit (INDEPENDENT_AMBULATORY_CARE_PROVIDER_SITE_OTHER): Payer: Medicaid Other | Admitting: Infectious Diseases

## 2022-07-20 ENCOUNTER — Encounter: Payer: Self-pay | Admitting: Infectious Diseases

## 2022-07-20 ENCOUNTER — Other Ambulatory Visit: Payer: Self-pay

## 2022-07-20 VITALS — BP 109/75 | HR 106 | Temp 97.3°F | Resp 16 | Ht 71.0 in | Wt 206.4 lb

## 2022-07-20 DIAGNOSIS — Z789 Other specified health status: Secondary | ICD-10-CM | POA: Diagnosis not present

## 2022-07-20 DIAGNOSIS — Z716 Tobacco abuse counseling: Secondary | ICD-10-CM | POA: Diagnosis not present

## 2022-07-20 DIAGNOSIS — E11621 Type 2 diabetes mellitus with foot ulcer: Secondary | ICD-10-CM | POA: Diagnosis not present

## 2022-07-20 DIAGNOSIS — M86171 Other acute osteomyelitis, right ankle and foot: Secondary | ICD-10-CM | POA: Diagnosis not present

## 2022-07-20 DIAGNOSIS — B20 Human immunodeficiency virus [HIV] disease: Secondary | ICD-10-CM | POA: Diagnosis not present

## 2022-07-20 MED ORDER — ESTRADIOL 2 MG PO TABS
2.0000 mg | ORAL_TABLET | Freq: Every day | ORAL | 11 refills | Status: DC
  Filled 2022-07-20 – 2022-07-28 (×2): qty 30, 30d supply, fill #0

## 2022-07-20 MED ORDER — BIKTARVY 50-200-25 MG PO TABS
1.0000 | ORAL_TABLET | Freq: Every day | ORAL | 2 refills | Status: DC
  Filled 2022-07-20 – 2022-07-28 (×2): qty 30, 30d supply, fill #0

## 2022-07-20 NOTE — Assessment & Plan Note (Signed)
Has been on biktarvy for several years, sounds to have had a lapse of some time based on rx fill history but had stockpile at home. VL 2 yrs ago < 50K but recently 150. She is interested in research trial opportunity potentially. Will have Maudie Mercury come speak with her about candidacy for hormone therapy for people on ARVs. She is also interested in [place]. Will draw genosure archive to help determine candidacy. Discussed importance of adherence with appointments for this to be successful. Also considering potential transfer of HIV care to [facility] who is managing her IV antibiotics currently d/t closer proximity.   I invited her back in 69m to review and see how she would like to proceed and determine study candidacy after foot infection clears up.   Genosure archive drawn today.   FU in 17m with either myself or Dr. [name].

## 2022-07-20 NOTE — Progress Notes (Signed)
Name: [name]  DOB: 05-03-1971 MRN: [id number] PCP: [name], NP     Subjective:   Chief Complaint  Patient presents with   Follow-up    B20      HPI: LOV with Dr. Lucianne Lei dam in 09-2020. Was on Biktarvy at that time. Has had a few fills of this from hospitalization recently and reports adherence everyday.  VL in January 158, CD5 1357. Wound care at [facility] for 18 weeks and no good progress for wound. Had surgery again recently for further debridement. She has seen a significant improvement since surgery. Has been on IV ceftriaxone now for about a week via PICC line. Seeing [facility] for that and plans to switch over to them for HIV care as well Diabetes much better controlled with sugars 85-90, none <115.    Interested in research studies if we have any available Requesting refill on estrace tablets. Continues to smoke cigarettes and understands risk a/w estrogen therapy and smoking.         07/20/2022    2:29 PM  Depression screen PHQ 2/9  Decreased Interest 0  Down, Depressed, Hopeless 1  PHQ - 2 Score 1    Review of Systems  Constitutional:  Negative for fever.  Cardiovascular:  Negative for chest pain.  Musculoskeletal:  Positive for joint pain.  Skin:  Negative for rash.  PICC line is without pain, drainage or erythema and is well maintained by [facility] Team. No swelling or altered sensation in affected distal extremity.    Past Medical History:  Diagnosis Date   Anxiety    Asthma    Depression    Diabetes mellitus    Gait abnormality    GERD (gastroesophageal reflux disease)    HIV disease ([place])    Hypertension    Loss of balance 09/14/2020   MRSA carrier    neck   Perirectal abscess 03/30/2017   Pneumonia    Polyuria 04/27/2015   Poorly controlled diabetes mellitus ([place]) 04/27/2015   Testicular mass 06/20/2016   Transgender 04/27/2015    Outpatient Medications Prior to Visit  Medication Sig Dispense Refill   ACCU-CHEK SOFTCLIX  LANCETS lancets 1 each by Other route 3 (three) times daily. 100 each 5   blood glucose meter kit and supplies Dispense based on patient and insurance preference. Use up to four times daily as directed. (FOR ICD-10 E10.9, E11.9). 1 each 0   cefTRIAXone (ROCEPHIN) 1-3.74 GM-%(50ML) IVPB Inject into the vein.     Dulaglutide (TRULICITY) 4.62 VO/3.5KK SOPN Inject 0.75 mg into the skin once a week. 2 mL 3   escitalopram (LEXAPRO) 20 MG tablet TAKE 1 TABLET BY MOUTH AT BEDTIME 90 tablet 1   gabapentin (NEURONTIN) 300 MG capsule Take 2 capsules (600mg ) by mouth in the morning, 1 capsule(300mg ) at lunch and 2 capsules (600mg ) at bedtime     glipiZIDE (GLUCOTROL) 10 MG tablet TAKE 1 TABLET BY MOUTH 2 TIMES DAILY BEFORE MEALS 180 tablet 1   Lancet Devices (ONETOUCH DELICA PLUS LANCING) MISC Use as directed 1 each 0   lisinopril (ZESTRIL) 20 MG tablet Take 1 tablet (20 mg total) by mouth daily. 90 tablet 1   metFORMIN (GLUCOPHAGE) 1000 MG tablet Take 1 tablet (1,000 mg total) by mouth 2 (two) times daily with a meal. 180 tablet 1   oxyCODONE (OXY IR/ROXICODONE) 5 MG immediate release tablet Take 1 tablet (5 mg total) by mouth every 6 (six) hours as needed for up  to 7 days for Severe pain (7-10) or Moderate pain (4-6). 28 tablet 0   pantoprazole (PROTONIX) 40 MG tablet Take by mouth.     spironolactone (ALDACTONE) 100 MG tablet Take 1 tablet by mouth daily.     bictegravir-emtricitabine-tenofovir AF (BIKTARVY) 50-200-25 MG TABS tablet Take by mouth daily.     estradiol (ESTRACE) 2 MG tablet Take 1 tablet (2 mg total) by mouth daily. 30 tablet 11   docusate sodium (COLACE CLEAR) 50 MG capsule Take 1 capsule (50 mg total) by mouth 2 times daily. (Patient not taking: Reported on 07/20/2022) 10 capsule 0   ondansetron (ZOFRAN-ODT) 4 MG disintegrating tablet Dissolve 1 tablet (4 mg total) in mouth every 8 (eight) hours as needed for nausea (Patient not taking: Reported on 07/20/2022) 20 tablet 0   PROAIR HFA 108 (90  Base) MCG/ACT inhaler INHALE 2 PUFFS BY MOUTH INTO LUNGS EVERY 6 HOURS AS NEEDED FOR WHEEZING OR SHORTNESS OF BREATH 8.5 g 1   amoxicillin-clavulanate (AUGMENTIN) 875-125 MG tablet Take 1 tablet by mouth 2 (two) times daily for 10 days (Patient not taking: Reported on 07/20/2022) 20 tablet 0   doxycycline (VIBRAMYCIN) 100 MG capsule Take 1 capsule (100 mg total) by mouth 2 (two) times daily for 10 days (Patient not taking: Reported on 07/20/2022) 20 capsule 0   No facility-administered medications prior to visit.     Allergies  Allergen Reactions   Propoxyphene N-Acetaminophen Other (See Comments)    Stomach cramps    Social History   Tobacco Use   Smoking status: Every Day    Packs/day: 0.10    Years: 27.00    Total pack years: 2.70    Types: Cigarettes    Last attempt to quit: 07/19/2018    Years since quitting: 4.0   Smokeless tobacco: Never   Tobacco comments:    started back yesterday  Vaping Use   Vaping Use: Never used  Substance Use Topics   Alcohol use: No    Alcohol/week: 0.0 standard drinks of alcohol   Drug use: Not Currently    Types: Marijuana    Comment: 1-2 times per month    Family History  Problem Relation Age of Onset   Other Mother        unsure of history   Heart failure Father    Diabetes Father     Social History   Substance and Sexual Activity  Sexual Activity Yes   Partners: Male   Comment: pt. given condoms     Objective:   Vitals:   07/20/22 1425  BP: 109/75  Pulse: (!) 106  Resp: 16  Temp: (!) 97.3 F (36.3 C)  TempSrc: Oral  SpO2: 99%  Weight: 206 lb 6.4 oz (93.6 kg)  Height: 5\' 11"  (1.803 m)   Body mass index is 28.79 kg/m.  Physical Exam  Lab Results Lab Results  Component Value Date   WBC 10.7 07/06/2022   HGB 16.1 07/06/2022   HCT 45.9 07/06/2022   MCV 88.1 07/06/2022   PLT 464 (H) 07/06/2022    Lab Results  Component Value Date   CREATININE 0.71 07/06/2022   BUN 21 07/06/2022   NA 138 07/06/2022   K 4.9  07/06/2022   CL 101 07/06/2022   CO2 27 07/06/2022    Lab Results  Component Value Date   ALT 13 07/06/2022   AST 15 07/06/2022   ALKPHOS 124 (H) 06/30/2022   BILITOT 0.4 07/06/2022    Lab Results  Component Value Date   CHOL 144 06/30/2022   HDL 29 (L) 06/30/2022   LDLCALC 92 06/30/2022   TRIG 125 06/30/2022   CHOLHDL 5.0 06/30/2022   HIV 1 RNA Quant  Date Value  07/06/2022 158 Copies/mL (H)  01/27/2021 59,300 Copies/mL (H)  09/12/2018 67 copies/mL (H)   HIV-1 RNA Viral Load (no units)  Date Value  09/04/2017 76  03/27/2017 <40  04/26/2016 <40   CD4 (no units)  Date Value  04/26/2016 2,465  04/27/2015 2,213  09/02/2014 2,369   CD4 T Cell Abs (/uL)  Date Value  07/06/2022 1,357  01/27/2021 1,171  09/12/2018 2,210     Assessment & Plan:   Problem List Items Addressed This Visit       Unprioritized   Human immunodeficiency virus (HIV) disease ([place])    Has been on biktarvy for several years, sounds to have had a lapse of some time based on rx fill history but had stockpile at home. VL 2 yrs ago < 50K but recently 150. She is interested in research trial opportunity potentially. Will have Maudie Mercury come speak with her about candidacy for hormone therapy for people on ARVs. She is also interested in [place]. Will draw genosure archive to help determine candidacy. Discussed importance of adherence with appointments for this to be successful. Also considering potential transfer of HIV care to [facility] who is managing her IV antibiotics currently d/t closer proximity.   I invited her back in 69m to review and see how she would like to proceed and determine study candidacy after foot infection clears up.   Genosure archive drawn today.   FU in 38m with either myself or Dr. [name].       Relevant Medications   cefTRIAXone (ROCEPHIN) 1-3.74 GM-%(50ML) IVPB   bictegravir-emtricitabine-tenofovir AF (BIKTARVY) 50-200-25 MG TABS tablet   Other Relevant Orders   GenoSure  Archive([facility])   Male-to-male transgender person    Refill low dose estrace 2 mg daily - she is aware of risks a/w smoking cigarettes and synthetic hormones.       Encounter for counseling for tobacco use disorder    Would like for her to abstain from cigarettes for multiple reasons (better blood flow to infected foot, prevention of DFU and lower r/f VTE given concurrent HRT).  She is pre-contemplative.       Other Visit Diagnoses     Transgender    -  Primary   Relevant Medications   estradiol (ESTRACE) 2 MG tablet      Total encounter time: 25 min including review of outside ID notes and hospital records.   Janene Madeira, MSN, NP-C [facility] [place] Pager: [phone] Office: [phone]  07/20/22  3:30 PM

## 2022-07-20 NOTE — Assessment & Plan Note (Signed)
Would like for her to abstain from cigarettes for multiple reasons (better blood flow to infected foot, prevention of DFU and lower r/f VTE given concurrent HRT).  She is pre-contemplative.

## 2022-07-20 NOTE — Assessment & Plan Note (Signed)
Refill low dose estrace 2 mg daily - she is aware of risks a/w smoking cigarettes and synthetic hormones.

## 2022-07-21 ENCOUNTER — Other Ambulatory Visit: Payer: Self-pay

## 2022-07-26 ENCOUNTER — Other Ambulatory Visit (HOSPITAL_COMMUNITY): Payer: Self-pay

## 2022-07-26 DIAGNOSIS — T368X5A Adverse effect of other systemic antibiotics, initial encounter: Secondary | ICD-10-CM | POA: Diagnosis not present

## 2022-07-26 DIAGNOSIS — R110 Nausea: Secondary | ICD-10-CM | POA: Diagnosis not present

## 2022-07-26 DIAGNOSIS — Z792 Long term (current) use of antibiotics: Secondary | ICD-10-CM | POA: Diagnosis not present

## 2022-07-26 DIAGNOSIS — M86671 Other chronic osteomyelitis, right ankle and foot: Secondary | ICD-10-CM | POA: Diagnosis not present

## 2022-07-26 MED ORDER — ONDANSETRON 4 MG PO TBDP
4.0000 mg | ORAL_TABLET | Freq: Two times a day (BID) | ORAL | 0 refills | Status: DC | PRN
  Filled 2022-07-26: qty 18, 9d supply, fill #0

## 2022-07-27 ENCOUNTER — Other Ambulatory Visit: Payer: Self-pay

## 2022-07-28 ENCOUNTER — Other Ambulatory Visit: Payer: Self-pay

## 2022-07-28 DIAGNOSIS — M86171 Other acute osteomyelitis, right ankle and foot: Secondary | ICD-10-CM | POA: Diagnosis not present

## 2022-07-28 DIAGNOSIS — L97512 Non-pressure chronic ulcer of other part of right foot with fat layer exposed: Secondary | ICD-10-CM | POA: Diagnosis not present

## 2022-07-28 NOTE — Progress Notes (Signed)
Patient seen by Junius Finner, PharmD Candidate on 07/28/2022 while they were picking up prescriptions at [place] [facility].   Blood pressure today was : 124/88, HR 113    Patient does not have an automated home blood pressure machine.    Medication review was performed. They are taking medications as prescribed.   The following barriers to adherence were noted:  - They do not have cost concerns.  - They do not have transportation concerns.  - They do not need assistance obtaining refills.  - They do not occasionally forget to take some of their prescribed medications.  - They do not feel like one/some of their medications make them feel poorly.  - They do not have questions or concerns about their medications.  - They do have follow up scheduled with their primary care provider/cardiologist.   The following interventions were completed:  - Medications were reviewed  - Patient was educated on goal blood pressures and long term health implications of elevated blood pressure   The patient has follow up scheduled:  08/04/2022 with RPh Daisy Blossom (Trulicity)  [id number] with Juluis Mire, NP   [facility]  PharmD Candidate 2024   Maryan Puls, PharmD PGY-1 [facility] Pharmacy Resident

## 2022-08-01 DIAGNOSIS — M86171 Other acute osteomyelitis, right ankle and foot: Secondary | ICD-10-CM | POA: Diagnosis not present

## 2022-08-01 DIAGNOSIS — Z5189 Encounter for other specified aftercare: Secondary | ICD-10-CM | POA: Diagnosis not present

## 2022-08-03 NOTE — Progress Notes (Signed)
S:     PCP: Juluis Mire   52 y.o. adult who presents for diabetes evaluation, education, and management in the context of the LIBERATE Study. 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 Patient was last seen by Primary Care Provider, Juluis Mire on 06/30/22.   At last visit with PCP on 06/30/22, patient reported to have stopped taking her medications because of deep depression. She was restarted on her home medications and instructed to follow up with the pharmacist.  At last visit with me on 06/30/22, no changes were made as she had just seen her PCP earlier that day, however, was able to verify insurance and copay affordability for her medications.   Today, patient arrives in good spirits and presents without any assistance. She states she is in a better mental state since last visit. She reported abdominal pain consistent with the timeline of Ceftriaxone initiation. No side effects to any of her DM medications.   Family/Social History:  -Fhx: HF, DM -Tobacco: former; quit in 2020 -Alcohol: denies   Current diabetes medications include: Trulicity 0.'75mg'$  once weekly, glipizide '10mg'$  BID, metformin '1000mg'$  BID   Patient reports adherence to taking all medications as prescribed.   Insurance coverage: [place] Medicaid & Aetna   Patient denies hypoglycemic events.  Reported home fasting blood sugars: 85-120s   Patient denies nocturia (nighttime urination).  Patient reports neuropathy (nerve pain). Patient denies visual changes. Patient reports self foot exams.   Patient reported dietary habits: -drinks diet sodas  -stopped eating sweets last Friday   Patient-reported exercise habits:  -limited by her amputation   O:   Lab Results  Component Value Date   HGBA1C 11.4 (H) 06/30/2022   There were no vitals filed for this visit.  Lipid Panel      Component Value Date/Time   CHOL 144 06/30/2022 1419   TRIG 125 06/30/2022 1419   HDL 29 (L) 06/30/2022 1419   CHOLHDL 5.0 06/30/2022 1419   CHOLHDL 5.4 (H) 04/13/2020 0910   VLDL 47 (H) 11/01/2017 0334   LDLCALC 92 06/30/2022 1419   LDLCALC 71 04/13/2020 0910    Clinical Atherosclerotic Cardiovascular Disease (ASCVD): Yes  The ASCVD Risk score (Arnett DK, et al., 2019) failed to calculate for the following reasons:   The patient has a prior MI or stroke diagnosis   Patient is participating in a Managed Medicaid Plan:  Yes   A/P: LIBERATE Study:  -Patient provided verbal consent to participate in the study. Consent documented in electronic medical record.  -Provided education on Libre 3 CGM. Collaborated to ensure Elenor Legato 3 app was downloaded on patient's phone. Educated on how to place sensor every 14 days, patient placed first sensor correctly and verbalized understanding of use, removal, and how to place next sensor. Discussed alarms. 8 sensors provided for a 3 month supply. Educated to contact the office if the sensor falls off early and replacements are needed before their next [employer].   Diabetes longstanding currently controlled based on home blood glucose readings. Patient is able to verbalize appropriate hypoglycemia management plan. Medication adherence appears optimal.  -Increased dose of GLP-1 Trulicity (dulaglutide) 1.'5mg'$  once weekly -Continued metformin '1000mg'$  BID -Continued glipizide '10mg'$  BID.  -Patient educated on purpose, proper use, and potential adverse effects of Trulicity.  -Extensively discussed pathophysiology of diabetes, recommended lifestyle interventions, dietary effects on blood sugar control.  -Counseled on s/sx of and management  of hypoglycemia.  -Next A1c anticipated 09/2022.   ASCVD risk - primary prevention in patient with diabetes. Last LDL is 92 not at goal of <70 mg/dL.high intensity statin indicated.  -Started rosuvastatin 20 mg.    Written patient instructions provided. Patient verbalized understanding of treatment plan.  Total time in face to face counseling 30 minutes.    Follow-up:  Pharmacist in one month. PCP clinic visit in 09/30/22  Maryan Puls, PharmD PGY-1 [facility] Pharm[facility] Resident

## 2022-08-04 ENCOUNTER — Ambulatory Visit: Payer: Medicaid Other | Attending: Primary Care | Admitting: Pharmacist

## 2022-08-04 ENCOUNTER — Other Ambulatory Visit: Payer: Self-pay

## 2022-08-04 ENCOUNTER — Encounter: Payer: Self-pay | Admitting: Pharmacist

## 2022-08-04 ENCOUNTER — Telehealth: Payer: Self-pay | Admitting: Pharmacist

## 2022-08-04 DIAGNOSIS — E1142 Type 2 diabetes mellitus with diabetic polyneuropathy: Secondary | ICD-10-CM

## 2022-08-04 LAB — POCT GLYCOSYLATED HEMOGLOBIN (HGB A1C): HbA1c, POC (controlled diabetic range): 8.7 % — AB (ref 0.0–7.0)

## 2022-08-04 MED ORDER — TRULICITY 1.5 MG/0.5ML ~~LOC~~ SOAJ
1.5000 mg | SUBCUTANEOUS | 3 refills | Status: DC
  Filled 2022-08-04 – 2022-09-16 (×3): qty 2, 28d supply, fill #0

## 2022-08-04 MED ORDER — ROSUVASTATIN CALCIUM 20 MG PO TABS
20.0000 mg | ORAL_TABLET | Freq: Every day | ORAL | 1 refills | Status: DC
  Filled 2022-08-04: qty 30, 30d supply, fill #0

## 2022-08-04 NOTE — Telephone Encounter (Signed)
LIBERATE Study  Received referral for patient participation in the LIBERATE CGM Study. Contacted patient to discuss study and confirmed HIPAA identifiers. Confirmed patient was provided the LIBERATE Study Information Sheet and any questions were answered.   Confirmed that patient meets study criteria by having a diagnosis of Type 2 Diabetes, is not currently on insulin, and most recent A1c is >8%.  Patient provided verbal consent to participate in the study. Consent documented in electronic medical record.   - Confirmed that patient has a compatible smart phone to download [place] 3 app. ([email or web address]) - Asked to download and create a Elenor Legato account prior to first study visit.  - Scheduled first study visit today. Confirmed patient has transportation to this appointment.  - Discussed use of MyChart in this study. Confirmed patient has an active MyChart account and is aware of their log in information.  Patient aware of pre-visit questionnaire that will be sent 2 days prior to their scheduled study visit and they will plan to complete before the visit.   Benard Halsted, PharmD, [place], [place] [phone]

## 2022-08-08 DIAGNOSIS — Z5189 Encounter for other specified aftercare: Secondary | ICD-10-CM | POA: Diagnosis not present

## 2022-08-08 DIAGNOSIS — M86171 Other acute osteomyelitis, right ankle and foot: Secondary | ICD-10-CM | POA: Diagnosis not present

## 2022-08-11 ENCOUNTER — Telehealth: Payer: Self-pay | Admitting: Pharmacist

## 2022-08-11 DIAGNOSIS — M86171 Other acute osteomyelitis, right ankle and foot: Secondary | ICD-10-CM | POA: Diagnosis not present

## 2022-08-11 DIAGNOSIS — L97512 Non-pressure chronic ulcer of other part of right foot with fat layer exposed: Secondary | ICD-10-CM | POA: Diagnosis not present

## 2022-08-11 DIAGNOSIS — E1169 Type 2 diabetes mellitus with other specified complication: Secondary | ICD-10-CM | POA: Diagnosis not present

## 2022-08-11 DIAGNOSIS — Z89421 Acquired absence of other right toe(s): Secondary | ICD-10-CM | POA: Diagnosis not present

## 2022-08-11 DIAGNOSIS — M869 Osteomyelitis, unspecified: Secondary | ICD-10-CM | POA: Diagnosis not present

## 2022-08-11 DIAGNOSIS — E11621 Type 2 diabetes mellitus with foot ulcer: Secondary | ICD-10-CM | POA: Diagnosis not present

## 2022-08-11 NOTE — Telephone Encounter (Signed)
Called patient for their 7-day follow-up call for the LIBERATE study. I was unable to reach the patient so I left a HIPAA-compliant message requesting that the patient return my call.   Benard Halsted, PharmD, [place], [place] [phone]

## 2022-08-12 DIAGNOSIS — Z419 Encounter for procedure for purposes other than remedying health state, unspecified: Secondary | ICD-10-CM | POA: Diagnosis not present

## 2022-08-15 DIAGNOSIS — Z5189 Encounter for other specified aftercare: Secondary | ICD-10-CM | POA: Diagnosis not present

## 2022-08-15 DIAGNOSIS — M86171 Other acute osteomyelitis, right ankle and foot: Secondary | ICD-10-CM | POA: Diagnosis not present

## 2022-08-17 DIAGNOSIS — L97519 Non-pressure chronic ulcer of other part of right foot with unspecified severity: Secondary | ICD-10-CM | POA: Diagnosis not present

## 2022-08-18 DIAGNOSIS — L97512 Non-pressure chronic ulcer of other part of right foot with fat layer exposed: Secondary | ICD-10-CM | POA: Diagnosis not present

## 2022-08-18 DIAGNOSIS — E11621 Type 2 diabetes mellitus with foot ulcer: Secondary | ICD-10-CM | POA: Diagnosis not present

## 2022-08-20 DIAGNOSIS — E11621 Type 2 diabetes mellitus with foot ulcer: Secondary | ICD-10-CM | POA: Diagnosis not present

## 2022-08-20 DIAGNOSIS — M86171 Other acute osteomyelitis, right ankle and foot: Secondary | ICD-10-CM | POA: Diagnosis not present

## 2022-08-24 ENCOUNTER — Encounter (INDEPENDENT_AMBULATORY_CARE_PROVIDER_SITE_OTHER): Payer: Self-pay

## 2022-08-25 DIAGNOSIS — M86171 Other acute osteomyelitis, right ankle and foot: Secondary | ICD-10-CM | POA: Diagnosis not present

## 2022-08-25 DIAGNOSIS — M86671 Other chronic osteomyelitis, right ankle and foot: Secondary | ICD-10-CM | POA: Diagnosis not present

## 2022-08-25 DIAGNOSIS — L97512 Non-pressure chronic ulcer of other part of right foot with fat layer exposed: Secondary | ICD-10-CM | POA: Diagnosis not present

## 2022-08-25 DIAGNOSIS — E11621 Type 2 diabetes mellitus with foot ulcer: Secondary | ICD-10-CM | POA: Diagnosis not present

## 2022-08-29 DIAGNOSIS — L97519 Non-pressure chronic ulcer of other part of right foot with unspecified severity: Secondary | ICD-10-CM | POA: Diagnosis not present

## 2022-08-31 ENCOUNTER — Telehealth (INDEPENDENT_AMBULATORY_CARE_PROVIDER_SITE_OTHER): Payer: Self-pay | Admitting: Primary Care

## 2022-08-31 NOTE — Telephone Encounter (Signed)
Will forward to nurse 

## 2022-08-31 NOTE — Telephone Encounter (Signed)
[name] would you be able to care of this

## 2022-08-31 NOTE — Telephone Encounter (Signed)
Patient called in says Trulicity is not available at the pharmacy now. Please call back for further assistance

## 2022-09-01 NOTE — Telephone Encounter (Signed)
Contacted pt to go over CPP response pt didn't answer left a detailed vm and if she has any questions or concerns to give [place] a call

## 2022-09-09 ENCOUNTER — Telehealth: Payer: Medicaid Other | Admitting: Pharmacist

## 2022-09-09 ENCOUNTER — Encounter: Payer: Self-pay | Admitting: Pharmacist

## 2022-09-11 NOTE — Progress Notes (Deleted)
Subjective:    Patient ID: [name], adult    DOB: 07-15-1970, 52 y.o.   MRN: [id number]  HPI   Past Medical History:  Diagnosis Date   Anxiety    Asthma    Depression    Diabetes mellitus    Gait abnormality    GERD (gastroesophageal reflux disease)    HIV disease ([place])    Hypertension    Loss of balance 09/14/2020   MRSA carrier    neck   Perirectal abscess 03/30/2017   Pneumonia    Polyuria 04/27/2015   Poorly controlled diabetes mellitus ([place]) 04/27/2015   Testicular mass 06/20/2016   Transgender 04/27/2015    Past Surgical History:  Procedure Laterality Date   APPENDECTOMY     TRANSFORAMINAL LUMBAR INTERBODY FUSION (TLIF) WITH PEDICLE SCREW FIXATION 3 LEVEL N/A 01/09/2019   Procedure: TRANSFORAMINAL LUMBAR INTERBODY FUSION (TLIF) WITH PEDICLE SCREW FIXATION 3 LEVEL, POSTERIOR SPINAL FUSION;  Surgeon: [name], MD;  Location: [facility] ORS;  Service: Neurosurgery;  Laterality: N/A;    Family History  Problem Relation Age of Onset   Other Mother        unsure of history   Heart failure Father    Diabetes Father       Social History   Socioeconomic History   Marital status: Single    Spouse name: Not on file   Number of children: 0   Years of education: 11th   Highest education level: Not on file  Occupational History   Occupation: Disabled   Tobacco Use   Smoking status: Every Day    Packs/day: 0.10    Years: 27.00    Additional pack years: 0.00    Total pack years: 2.70    Types: Cigarettes    Last attempt to quit: 07/19/2018    Years since quitting: 4.1   Smokeless tobacco: Never   Tobacco comments:    started back yesterday  Vaping Use   Vaping Use: Never used  Substance and Sexual Activity   Alcohol use: No    Alcohol/week: 0.0 standard drinks of alcohol   Drug use: Not Currently    Types: Marijuana    Comment: 1-2 times per month   Sexual activity: Yes    Partners: Male    Comment: pt. given condoms  Other Topics Concern    Not on file  Social History Narrative   Drinks 64 ounces of Diet Pepper per day.    Left-handed.   Lives with a friend.   Social Determinants of Health   Financial Resource Strain: Not on file  Food Insecurity: Food Insecurity Present (03/01/2021)   Hunger Vital Sign    Worried About Running Out of Food in the Last Year: Sometimes true    Ran Out of Food in the Last Year: Sometimes true  Transportation Needs: Unmet Transportation Needs (12/28/2020)   PRAPARE - Hydrologist (Medical): Yes    Lack of Transportation (Non-Medical): Yes  Physical Activity: Insufficiently Active (03/01/2021)   Exercise Vital Sign    Days of Exercise per Week: 7 days    Minutes of Exercise per Session: 20 min  Stress: Not on file  Social Connections: Not on file    Allergies  Allergen Reactions   Propoxyphene N-Acetaminophen Other (See Comments)    Stomach cramps     Current Outpatient Medications:    ACCU-CHEK SOFTCLIX LANCETS lancets, 1 each by Other route 3 (three) times daily., Disp: 100 each,  Rfl: 5   bictegravir-emtricitabine-tenofovir AF (BIKTARVY) 50-200-25 MG TABS tablet, Take 1 tablet by mouth daily., Disp: 30 tablet, Rfl: 2   blood glucose meter kit and supplies, Dispense based on patient and insurance preference. Use up to four times daily as directed. (FOR ICD-10 E10.9, E11.9)., Disp: 1 each, Rfl: 0   docusate sodium (COLACE CLEAR) 50 MG capsule, Take 1 capsule (50 mg total) by mouth 2 times daily. (Patient not taking: Reported on 07/20/2022), Disp: 10 capsule, Rfl: 0   Dulaglutide (TRULICITY) 1.5 0000000 SOPN, Inject 1.5 mg into the skin once a week., Disp: 2 mL, Rfl: 3   escitalopram (LEXAPRO) 20 MG tablet, TAKE 1 TABLET BY MOUTH AT BEDTIME, Disp: 90 tablet, Rfl: 1   estradiol (ESTRACE) 2 MG tablet, Take 1 tablet (2 mg total) by mouth daily., Disp: 30 tablet, Rfl: 11   gabapentin (NEURONTIN) 300 MG capsule, Take 2 capsules (600mg ) by mouth in the morning, 1  capsule(300mg ) at lunch and 2 capsules (600mg ) at bedtime, Disp: , Rfl:    glipiZIDE (GLUCOTROL) 10 MG tablet, TAKE 1 TABLET BY MOUTH 2 TIMES DAILY BEFORE MEALS, Disp: 180 tablet, Rfl: 1   Lancet Devices (ONETOUCH DELICA PLUS LANCING) MISC, Use as directed, Disp: 1 each, Rfl: 0   lisinopril (ZESTRIL) 20 MG tablet, Take 1 tablet (20 mg total) by mouth daily., Disp: 90 tablet, Rfl: 1   metFORMIN (GLUCOPHAGE) 1000 MG tablet, Take 1 tablet (1,000 mg total) by mouth 2 (two) times daily with a meal., Disp: 180 tablet, Rfl: 1   ondansetron (ZOFRAN-ODT) 4 MG disintegrating tablet, Dissolve 1 tablet (4 mg total) in mouth every 8 (eight) hours as needed for nausea (Patient not taking: Reported on 07/20/2022), Disp: 20 tablet, Rfl: 0   ondansetron (ZOFRAN-ODT) 4 MG disintegrating tablet, Dissolve 1 tablet (4 mg total) by mouth every 12 (twelve) hours as needed for nausea. Take 20 minutes prior to daily Ceftriaxone dose., Disp: 30 tablet, Rfl: 0   oxyCODONE (OXY IR/ROXICODONE) 5 MG immediate release tablet, Take 1 tablet (5 mg total) by mouth every 6 (six) hours as needed for up to 7 days for Severe pain (7-10) or Moderate pain (4-6)., Disp: 28 tablet, Rfl: 0   pantoprazole (PROTONIX) 40 MG tablet, Take by mouth., Disp: , Rfl:    PROAIR HFA 108 (90 Base) MCG/ACT inhaler, INHALE 2 PUFFS BY MOUTH INTO LUNGS EVERY 6 HOURS AS NEEDED FOR WHEEZING OR SHORTNESS OF BREATH, Disp: 8.5 g, Rfl: 1   rosuvastatin (CRESTOR) 20 MG tablet, Take 1 tablet (20 mg total) by mouth daily., Disp: 90 tablet, Rfl: 1   spironolactone (ALDACTONE) 100 MG tablet, Take 1 tablet by mouth daily., Disp: , Rfl:     Review of Systems     Objective:   Physical Exam        Assessment & Plan:

## 2022-09-12 ENCOUNTER — Ambulatory Visit: Payer: Medicaid Other | Admitting: Infectious Disease

## 2022-09-12 DIAGNOSIS — Z419 Encounter for procedure for purposes other than remedying health state, unspecified: Secondary | ICD-10-CM | POA: Diagnosis not present

## 2022-09-15 ENCOUNTER — Telehealth: Payer: Self-pay

## 2022-09-15 NOTE — Telephone Encounter (Signed)
Copied from [place] [id number]. Topic: General - Other >> Sep 15, 2022 12:47 PM Santiya F wrote: Reason for CRM: Pt is calling in because she was discharged from [facility] on 04/03 and wanted to schedule a hospital follow up. Scheduled pt for the 18th of April, but pt wants to know if there is a sooner day she can come in. Please advise.

## 2022-09-15 NOTE — Telephone Encounter (Signed)
Please contact pt and schedule sooner appt if provider has openings

## 2022-09-16 ENCOUNTER — Other Ambulatory Visit (HOSPITAL_COMMUNITY): Payer: Self-pay

## 2022-09-29 ENCOUNTER — Ambulatory Visit (INDEPENDENT_AMBULATORY_CARE_PROVIDER_SITE_OTHER): Payer: Medicaid Other | Admitting: Primary Care

## 2022-09-30 ENCOUNTER — Ambulatory Visit (INDEPENDENT_AMBULATORY_CARE_PROVIDER_SITE_OTHER): Payer: Medicaid Other | Admitting: Primary Care

## 2022-10-12 DIAGNOSIS — Z419 Encounter for procedure for purposes other than remedying health state, unspecified: Secondary | ICD-10-CM | POA: Diagnosis not present

## 2022-10-13 ENCOUNTER — Ambulatory Visit (INDEPENDENT_AMBULATORY_CARE_PROVIDER_SITE_OTHER): Payer: Self-pay | Admitting: *Deleted

## 2022-10-13 NOTE — Telephone Encounter (Signed)
Patient contact# [phone] Chief Complaint: blood in urine Symptoms: pain with urination, blood in urine Frequency: blood started today Pertinent Negatives: Patient denies fever, abdominal pain Disposition: [] ED /[] Urgent Care (no appt availability in office) / [] Appointment(In office/virtual)/ []  [place] [facility]/ [] Home Care/ [] Refused Recommended Disposition /[] [facility]/ []  Follow-up with PCP Additional Notes: Patient is calling to report pain with urination and blood in urine that started today- unable to get in touch with office- will send message for scheduling - if patient can not be seen she is aware UC may be advised. Contact# [phone]

## 2022-10-13 NOTE — Telephone Encounter (Signed)
Reason for Disposition  Pain or burning with passing urine  Answer Assessment - Initial Assessment Questions 1. COLOR of URINE: "Describe the color of the urine."  (e.g., tea-colored, pink, red, bloody) "Do you have blood clots in your urine?" (e.g., none, pea, grape, small coin)     Blood when wiped, blood mixed at end of stream 2. ONSET: "When did the bleeding start?"      This morning 3. EPISODES: "How many times has there been blood in the urine?" or "How many times today?"     Every time- 2 times 4. PAIN with URINATION: "Is there any pain with passing your urine?" If Yes, ask: "How bad is the pain?"  (Scale 1-10; or mild, moderate, severe)    - MILD: Complains slightly about urination hurting.    - MODERATE: Interferes with normal activities.      - SEVERE: Excruciating, unwilling or unable to urinate because of the pain.      Burning sensation 5. FEVER: "Do you have a fever?" If Yes, ask: "What is your temperature, how was it measured, and when did it start?"     no 6. ASSOCIATED SYMPTOMS: "Are you passing urine more frequently than usual?"     The other day- frequency 7. OTHER SYMPTOMS: "Do you have any other symptoms?" (e.g., back/flank pain, abdomen pain, vomiting)     no  Protocols used: Urine - Blood In-A-AH

## 2022-10-14 ENCOUNTER — Other Ambulatory Visit (HOSPITAL_COMMUNITY): Payer: Self-pay

## 2022-10-14 ENCOUNTER — Ambulatory Visit (HOSPITAL_COMMUNITY)
Admission: EM | Admit: 2022-10-14 | Discharge: 2022-10-14 | Disposition: A | Discharge status: Home / Self Care | Payer: Medicaid Other | Attending: Urgent Care | Admitting: Urgent Care

## 2022-10-14 ENCOUNTER — Encounter (HOSPITAL_COMMUNITY): Payer: Self-pay | Admitting: Emergency Medicine

## 2022-10-14 DIAGNOSIS — R300 Dysuria: Secondary | ICD-10-CM | POA: Diagnosis not present

## 2022-10-14 DIAGNOSIS — R109 Unspecified abdominal pain: Secondary | ICD-10-CM | POA: Diagnosis not present

## 2022-10-14 DIAGNOSIS — N453 Epididymo-orchitis: Secondary | ICD-10-CM | POA: Insufficient documentation

## 2022-10-14 LAB — POCT URINALYSIS DIP (MANUAL ENTRY)
Glucose, UA: NEGATIVE mg/dL
Nitrite, UA: NEGATIVE
Protein Ur, POC: 100 mg/dL — AB
Spec Grav, UA: 1.025 (ref 1.010–1.025)
Urobilinogen, UA: >=8 E.U./dL — AB
pH, UA: 7 (ref 5.0–8.0)

## 2022-10-14 MED ORDER — CEFTRIAXONE SODIUM 1 G IJ SOLR
INTRAMUSCULAR | Status: AC
  Filled 2022-10-14: qty 10

## 2022-10-14 MED ORDER — CEFTRIAXONE SODIUM 1 G IJ SOLR
1.0000 g | Freq: Once | INTRAMUSCULAR | Status: AC
  Administered 2022-10-14: 1 g via INTRAMUSCULAR

## 2022-10-14 MED ORDER — ACETAMINOPHEN 325 MG PO TABS
650.0000 mg | ORAL_TABLET | Freq: Four times a day (QID) | ORAL | 0 refills | Status: DC | PRN
  Filled 2022-10-14: qty 30, 4d supply, fill #0

## 2022-10-14 MED ORDER — LEVOFLOXACIN 500 MG PO TABS
500.0000 mg | ORAL_TABLET | Freq: Every day | ORAL | 0 refills | Status: DC
  Filled 2022-10-14: qty 10, 10d supply, fill #0

## 2022-10-14 MED ORDER — ACETAMINOPHEN 325 MG PO TABS
975.0000 mg | ORAL_TABLET | Freq: Once | ORAL | Status: AC
  Administered 2022-10-14: 975 mg via ORAL

## 2022-10-14 MED ORDER — LIDOCAINE HCL (PF) 1 % IJ SOLN
INTRAMUSCULAR | Status: AC
  Filled 2022-10-14: qty 4

## 2022-10-14 MED ORDER — ACETAMINOPHEN 325 MG PO TABS
ORAL_TABLET | ORAL | Status: AC
  Filled 2022-10-14: qty 3

## 2022-10-14 NOTE — Telephone Encounter (Signed)
Pt went to urgent care.

## 2022-10-14 NOTE — ED Provider Notes (Signed)
[facility]   MRN: [id number] DOB: Aug 26, 1970  Subjective:   [name] is a 52 y.o. adult presenting for 2-day history of acute onset worsening dysuria, right flank pain, right low back pain, bloody urine.  Has also had severe testicle pain.  Patient is HIV positive, takes Radio producer.  Has not been sexually active for 3 years.  No concern for sexually transmitted infection.  No current facility-administered medications for this encounter.  Current Outpatient Medications:    ACCU-CHEK SOFTCLIX LANCETS lancets, 1 each by Other route 3 (three) times daily., Disp: 100 each, Rfl: 5   bictegravir-emtricitabine-tenofovir AF (BIKTARVY) 50-200-25 MG TABS tablet, Take 1 tablet by mouth daily., Disp: 30 tablet, Rfl: 2   blood glucose meter kit and supplies, Dispense based on patient and insurance preference. Use up to four times daily as directed. (FOR ICD-10 E10.9, E11.9)., Disp: 1 each, Rfl: 0   docusate sodium (COLACE CLEAR) 50 MG capsule, Take 1 capsule (50 mg total) by mouth 2 times daily. (Patient not taking: Reported on 07/20/2022), Disp: 10 capsule, Rfl: 0   Dulaglutide (TRULICITY) 1.5 MG/0.5ML SOPN, Inject 1.5 mg into the skin once a week., Disp: 2 mL, Rfl: 3   escitalopram (LEXAPRO) 20 MG tablet, TAKE 1 TABLET BY MOUTH AT BEDTIME, Disp: 90 tablet, Rfl: 1   estradiol (ESTRACE) 2 MG tablet, Take 1 tablet (2 mg total) by mouth daily., Disp: 30 tablet, Rfl: 11   gabapentin (NEURONTIN) 300 MG capsule, Take 2 capsules (600mg ) by mouth in the morning, 1 capsule(300mg ) at lunch and 2 capsules (600mg ) at bedtime, Disp: , Rfl:    glipiZIDE (GLUCOTROL) 10 MG tablet, TAKE 1 TABLET BY MOUTH 2 TIMES DAILY BEFORE MEALS, Disp: 180 tablet, Rfl: 1   Lancet Devices (ONETOUCH DELICA PLUS LANCING) MISC, Use as directed, Disp: 1 each, Rfl: 0   lisinopril (ZESTRIL) 20 MG tablet, Take 1 tablet (20 mg total) by mouth daily., Disp: 90 tablet, Rfl: 1   metFORMIN (GLUCOPHAGE) 1000 MG tablet, Take 1  tablet (1,000 mg total) by mouth 2 (two) times daily with a meal., Disp: 180 tablet, Rfl: 1   ondansetron (ZOFRAN-ODT) 4 MG disintegrating tablet, Dissolve 1 tablet (4 mg total) in mouth every 8 (eight) hours as needed for nausea (Patient not taking: Reported on 07/20/2022), Disp: 20 tablet, Rfl: 0   ondansetron (ZOFRAN-ODT) 4 MG disintegrating tablet, Dissolve 1 tablet (4 mg total) by mouth every 12 (twelve) hours as needed for nausea. Take 20 minutes prior to daily Ceftriaxone dose., Disp: 30 tablet, Rfl: 0   oxyCODONE (OXY IR/ROXICODONE) 5 MG immediate release tablet, Take 1 tablet (5 mg total) by mouth every 6 (six) hours as needed for up to 7 days for Severe pain (7-10) or Moderate pain (4-6)., Disp: 28 tablet, Rfl: 0   pantoprazole (PROTONIX) 40 MG tablet, Take by mouth., Disp: , Rfl:    PROAIR HFA 108 (90 Base) MCG/ACT inhaler, INHALE 2 PUFFS BY MOUTH INTO LUNGS EVERY 6 HOURS AS NEEDED FOR WHEEZING OR SHORTNESS OF BREATH, Disp: 8.5 g, Rfl: 1   rosuvastatin (CRESTOR) 20 MG tablet, Take 1 tablet (20 mg total) by mouth daily., Disp: 90 tablet, Rfl: 1   spironolactone (ALDACTONE) 100 MG tablet, Take 1 tablet by mouth daily., Disp: , Rfl:    Allergies  Allergen Reactions   Propoxyphene N-Acetaminophen Other (See Comments)    Stomach cramps    Past Medical History:  Diagnosis Date   Anxiety    Asthma  Depression    Diabetes mellitus    Gait abnormality    GERD (gastroesophageal reflux disease)    HIV disease (HCC)    Hypertension    Loss of balance 09/14/2020   MRSA carrier    neck   Perirectal abscess 03/30/2017   Pneumonia    Polyuria 04/27/2015   Poorly controlled diabetes mellitus (HCC) 04/27/2015   Testicular mass 06/20/2016   Transgender 04/27/2015     Past Surgical History:  Procedure Laterality Date   APPENDECTOMY     TRANSFORAMINAL LUMBAR INTERBODY FUSION (TLIF) WITH PEDICLE SCREW FIXATION 3 LEVEL N/A 01/09/2019   Procedure: TRANSFORAMINAL LUMBAR INTERBODY FUSION (TLIF)  WITH PEDICLE SCREW FIXATION 3 LEVEL, POSTERIOR SPINAL FUSION;  Surgeon: Venetia Night, MD;  Location: [facility] ORS;  Service: Neurosurgery;  Laterality: N/A;    Family History  Problem Relation Age of Onset   Other Mother        unsure of history   Heart failure Father    Diabetes Father     Social History   Tobacco Use   Smoking status: Every Day    Packs/day: 0.10    Years: 27.00    Additional pack years: 0.00    Total pack years: 2.70    Types: Cigarettes    Last attempt to quit: 07/19/2018    Years since quitting: 4.2   Smokeless tobacco: Never   Tobacco comments:    started back yesterday  Vaping Use   Vaping Use: Never used  Substance Use Topics   Alcohol use: No    Alcohol/week: 0.0 standard drinks of alcohol   Drug use: Not Currently    Types: Marijuana    Comment: 1-2 times per month    ROS   Objective:   Vitals: BP 109/67 (BP Location: Left Arm)   Pulse (!) 109   Temp (!) 100.4 F (38 C) (Oral)   Resp 20   SpO2 96%   Physical Exam Constitutional:      General: She is not in acute distress.    Appearance: Normal appearance. She is well-developed and normal weight. She is not ill-appearing, toxic-appearing or diaphoretic.  HENT:     Head: Normocephalic and atraumatic.     Right Ear: External ear normal.     Left Ear: External ear normal.     Nose: Nose normal.     Mouth/Throat:     Mouth: Mucous membranes are moist.  Eyes:     General: No scleral icterus.       Right eye: No discharge.        Left eye: No discharge.     Extraocular Movements: Extraocular movements intact.     Conjunctiva/sclera: Conjunctivae normal.  Cardiovascular:     Rate and Rhythm: Normal rate.  Pulmonary:     Effort: Pulmonary effort is normal.  Abdominal:     General: Bowel sounds are normal. There is no distension.     Palpations: Abdomen is soft. There is no mass.     Tenderness: There is abdominal tenderness (right groin and low back/flank right side). There is  right CVA tenderness. There is no left CVA tenderness, guarding or rebound.  Genitourinary:    Penis: Circumcised.      Testes:        Right: Swelling present. Mass, tenderness, testicular hydrocele or varicocele not present. Right testis is descended. Cremasteric reflex is present.         Left: Mass, tenderness, swelling, testicular hydrocele or varicocele not present.  Left testis is descended. Cremasteric reflex is present.      Epididymis:     Right: Not inflamed or enlarged. Tenderness present. No mass.     Left: Not inflamed or enlarged. No mass or tenderness.     Comments: Testicle in vertical orientation. Musculoskeletal:     Cervical back: Normal range of motion.  Skin:    General: Skin is warm and dry.  Neurological:     General: No focal deficit present.     Mental Status: She is alert and oriented to person, place, and time.  Psychiatric:        Mood and Affect: Mood normal.        Behavior: Behavior normal.        Thought Content: Thought content normal.        Judgment: Judgment normal.     Results for orders placed or performed during the hospital encounter of 10/14/22 (from the past 24 hour(s))  POC urinalysis dipstick     Status: Abnormal   Collection Time: 10/14/22 12:38 PM  Result Value Ref Range   Color, UA red (A) yellow   Clarity, UA cloudy (A) clear   Glucose, UA negative negative mg/dL   Bilirubin, UA small (A) negative   Ketones, POC UA small (15) (A) negative mg/dL   Spec Grav, UA 1.610 9.604 - 1.025   Blood, UA trace-intact (A) negative   pH, UA 7.0 5.0 - 8.0   Protein Ur, POC =100 (A) negative mg/dL   Urobilinogen, UA >=5.4 (A) 0.2 or 1.0 E.U./dL   Nitrite, UA Negative Negative   Leukocytes, UA Small (1+) (A) Negative   IM ceftriaxone administered in clinic at 1000mg .  P.o. Tylenol for severe pain.  Assessment and Plan :   PDMP not reviewed this encounter.  1. Orchitis and epididymitis   2. Flank pain   3. Dysuria    Patient declined STI  testing and I am in agreement as the patient states they have not been sexually active for the past 3 years.  Discussed possibility of orchitis, epididymitis, scrotal abscess, pyelonephritis, UTI, obstructive uropathy, at this stage will defer ER visit.  Maintain strict ER precautions however.  Will use levofloxacin as an outpatient in addition to the IM ceftriaxone administered as above.  Hydrate very well.  Urine culture pending. Counseled patient on potential for adverse effects with medications prescribed/recommended today, ER and return-to-clinic precautions discussed, patient verbalized understanding.    Wallis Bamberg, [place] 10/14/22 1317

## 2022-10-14 NOTE — Discharge Instructions (Addendum)
I am managing you for epididymitis and orchitis with the injection of the ceftriaxone antibiotic and levofloxacin antibiotic that you pick up from the pharmacy. Hold your glipizide for now. We will let you know about your urine culture results on Monday. Hydrate very well with 80 ounces of water daily. Do not use any nonsteroidal anti-inflammatories (NSAIDs) like ibuprofen, Motrin, naproxen, Aleve, etc. which are all available over-the-counter.  Please just use Tylenol at a dose of 650mg  once every 6 hours as needed for your aches, pains, fevers.

## 2022-10-14 NOTE — ED Triage Notes (Signed)
Pt c/o blood in urine and dysuria since yesterday.  Pt reports right testicle pain yesterday as well. Unsure if swollen.

## 2022-10-15 LAB — URINE CULTURE

## 2022-10-16 ENCOUNTER — Emergency Department (HOSPITAL_COMMUNITY): Payer: Medicaid Other

## 2022-10-16 ENCOUNTER — Emergency Department (HOSPITAL_COMMUNITY)
Admission: EM | Admit: 2022-10-16 | Discharge: 2022-10-16 | Discharge status: Against Medical Advice | Payer: Medicaid Other | Attending: Emergency Medicine | Admitting: Emergency Medicine

## 2022-10-16 ENCOUNTER — Encounter (HOSPITAL_COMMUNITY): Payer: Self-pay | Admitting: Emergency Medicine

## 2022-10-16 ENCOUNTER — Inpatient Hospital Stay (HOSPITAL_COMMUNITY)
Admission: EM | Admit: 2022-10-16 | Discharge: 2022-10-20 | DRG: 728 | Disposition: A | Discharge status: Home Health | Payer: Medicaid Other | Attending: Internal Medicine | Admitting: Internal Medicine

## 2022-10-16 ENCOUNTER — Other Ambulatory Visit: Payer: Self-pay

## 2022-10-16 ENCOUNTER — Encounter (HOSPITAL_COMMUNITY): Payer: Self-pay

## 2022-10-16 DIAGNOSIS — Z789 Other specified health status: Secondary | ICD-10-CM | POA: Diagnosis present

## 2022-10-16 DIAGNOSIS — F419 Anxiety disorder, unspecified: Secondary | ICD-10-CM | POA: Diagnosis present

## 2022-10-16 DIAGNOSIS — N12 Tubulo-interstitial nephritis, not specified as acute or chronic: Secondary | ICD-10-CM | POA: Diagnosis present

## 2022-10-16 DIAGNOSIS — Z5321 Procedure and treatment not carried out due to patient leaving prior to being seen by health care provider: Secondary | ICD-10-CM | POA: Insufficient documentation

## 2022-10-16 DIAGNOSIS — Z5941 Food insecurity: Secondary | ICD-10-CM

## 2022-10-16 DIAGNOSIS — F32A Depression, unspecified: Secondary | ICD-10-CM | POA: Diagnosis present

## 2022-10-16 DIAGNOSIS — S91301A Unspecified open wound, right foot, initial encounter: Secondary | ICD-10-CM | POA: Diagnosis not present

## 2022-10-16 DIAGNOSIS — F640 Transsexualism: Secondary | ICD-10-CM | POA: Diagnosis present

## 2022-10-16 DIAGNOSIS — Z7984 Long term (current) use of oral hypoglycemic drugs: Secondary | ICD-10-CM

## 2022-10-16 DIAGNOSIS — N433 Hydrocele, unspecified: Secondary | ICD-10-CM | POA: Diagnosis present

## 2022-10-16 DIAGNOSIS — Z981 Arthrodesis status: Secondary | ICD-10-CM

## 2022-10-16 DIAGNOSIS — N452 Orchitis: Secondary | ICD-10-CM | POA: Diagnosis present

## 2022-10-16 DIAGNOSIS — F17200 Nicotine dependence, unspecified, uncomplicated: Secondary | ICD-10-CM | POA: Diagnosis present

## 2022-10-16 DIAGNOSIS — Z8249 Family history of ischemic heart disease and other diseases of the circulatory system: Secondary | ICD-10-CM

## 2022-10-16 DIAGNOSIS — E1142 Type 2 diabetes mellitus with diabetic polyneuropathy: Secondary | ICD-10-CM | POA: Diagnosis present

## 2022-10-16 DIAGNOSIS — Z794 Long term (current) use of insulin: Secondary | ICD-10-CM

## 2022-10-16 DIAGNOSIS — Z886 Allergy status to analgesic agent status: Secondary | ICD-10-CM

## 2022-10-16 DIAGNOSIS — Z8701 Personal history of pneumonia (recurrent): Secondary | ICD-10-CM

## 2022-10-16 DIAGNOSIS — B9620 Unspecified Escherichia coli [E. coli] as the cause of diseases classified elsewhere: Secondary | ICD-10-CM | POA: Diagnosis present

## 2022-10-16 DIAGNOSIS — N453 Epididymo-orchitis: Secondary | ICD-10-CM | POA: Diagnosis not present

## 2022-10-16 DIAGNOSIS — N200 Calculus of kidney: Secondary | ICD-10-CM | POA: Diagnosis present

## 2022-10-16 DIAGNOSIS — N3001 Acute cystitis with hematuria: Secondary | ICD-10-CM

## 2022-10-16 DIAGNOSIS — Z1624 Resistance to multiple antibiotics: Secondary | ICD-10-CM | POA: Diagnosis present

## 2022-10-16 DIAGNOSIS — R269 Unspecified abnormalities of gait and mobility: Secondary | ICD-10-CM | POA: Diagnosis present

## 2022-10-16 DIAGNOSIS — Z5982 Transportation insecurity: Secondary | ICD-10-CM

## 2022-10-16 DIAGNOSIS — K219 Gastro-esophageal reflux disease without esophagitis: Secondary | ICD-10-CM | POA: Diagnosis present

## 2022-10-16 DIAGNOSIS — I10 Essential (primary) hypertension: Secondary | ICD-10-CM | POA: Diagnosis present

## 2022-10-16 DIAGNOSIS — Z833 Family history of diabetes mellitus: Secondary | ICD-10-CM

## 2022-10-16 DIAGNOSIS — J45909 Unspecified asthma, uncomplicated: Secondary | ICD-10-CM | POA: Diagnosis present

## 2022-10-16 DIAGNOSIS — F17210 Nicotine dependence, cigarettes, uncomplicated: Secondary | ICD-10-CM | POA: Diagnosis present

## 2022-10-16 DIAGNOSIS — Z7985 Long-term (current) use of injectable non-insulin antidiabetic drugs: Secondary | ICD-10-CM

## 2022-10-16 DIAGNOSIS — Z79899 Other long term (current) drug therapy: Secondary | ICD-10-CM

## 2022-10-16 DIAGNOSIS — B20 Human immunodeficiency virus [HIV] disease: Secondary | ICD-10-CM | POA: Diagnosis present

## 2022-10-16 DIAGNOSIS — R1909 Other intra-abdominal and pelvic swelling, mass and lump: Secondary | ICD-10-CM

## 2022-10-16 DIAGNOSIS — E1165 Type 2 diabetes mellitus with hyperglycemia: Secondary | ICD-10-CM | POA: Diagnosis present

## 2022-10-16 LAB — URINE CULTURE: Culture: >=100000 — AB

## 2022-10-16 MED ORDER — MORPHINE SULFATE (PF) 4 MG/ML IV SOLN
4.0000 mg | Freq: Once | INTRAVENOUS | Status: AC
  Administered 2022-10-16: 4 mg via INTRAVENOUS
  Filled 2022-10-16: qty 1

## 2022-10-16 NOTE — ED Triage Notes (Signed)
Pt here from home after getting results of his urine culture that showed e coli , pt is day 3 of antibiotics that was prescribed at [facility]

## 2022-10-16 NOTE — ED Triage Notes (Signed)
Pt. States that she has ESBL and is having R. testicular swelling associated with pain. Pt. Is currently being treated for a UTI. Pt. Also states that she has an opened wound on her foot.

## 2022-10-16 NOTE — ED Provider Notes (Signed)
[facility] DEPARTMENT AT [facility] Provider Note   CSN: [id number] Arrival date & time: 10/16/22  2007     History No chief complaint on file.   [name] is a 52 y.o. adult with h/o DM, HIV on Biktarvy, HTN presents to the ER for evaluation of right testicular swelling/pain along with hematuria and dysuria. On Thursday, the patient started with dysuria and hematuria. Friday, started having right testicle pain. Went to UC on Friday and was placed on Levaquin and had a urine culture. Yesterday, pain and swelling of right testicle worsened. Dysuria and hematuria not improved. Pain and swelling still worsened today, so decided to come to the ER. They report the pain is radiating up from their scrotum into their very low belly. Does have some bilateral lower back as well. Denies any nausea, vomiting, diarrhea, constipation. They report there was a fever of 100.22F on Friday and a subjective fever on Saturday. Has not measured one today. They are not concerned for STD as they have not been sexually active in 3 years. Patient's sister reports that she would like their chronic wound evaluated. She has not been there to clean it and dress it like she usually does because she has been on vacation. Patient reports it looks better to them. They have an appointment with the wound care provider tomorrow. Currently on Estradiol. Daily tobacco use.   HPI     Home Medications Prior to Admission medications   Medication Sig Start Date End Date Taking? Authorizing Provider  ACCU-CHEK SOFTCLIX LANCETS lancets 1 each by Other route 3 (three) times daily. 12/06/17   [name], PA-C  acetaminophen (TYLENOL) 325 MG tablet Take 2 tablets (650 mg total) by mouth every 6 (six) hours as needed for moderate pain. 10/14/22   Wallis Bamberg, PA-C  bictegravir-emtricitabine-tenofovir AF (BIKTARVY) 50-200-25 MG TABS tablet Take 1 tablet by mouth daily. 07/20/22   Blanchard Kelch, NP  blood  glucose meter kit and supplies Dispense based on patient and insurance preference. Use up to four times daily as directed. (FOR ICD-10 E10.9, E11.9). 09/02/20   Grayce Sessions, NP  docusate sodium (COLACE CLEAR) 50 MG capsule Take 1 capsule (50 mg total) by mouth 2 times daily. Patient not taking: Reported on 07/20/2022 07/05/22     Dulaglutide (TRULICITY) 1.5 MG/0.5ML SOPN Inject 1.5 mg into the skin once a week. 08/04/22   Hoy Register, MD  escitalopram (LEXAPRO) 20 MG tablet TAKE 1 TABLET BY MOUTH AT BEDTIME 06/30/22 06/30/23  Grayce Sessions, NP  estradiol (ESTRACE) 2 MG tablet Take 1 tablet (2 mg total) by mouth daily. 07/20/22   Blanchard Kelch, NP  gabapentin (NEURONTIN) 300 MG capsule Take 2 capsules (600mg ) by mouth in the morning, 1 capsule(300mg ) at lunch and 2 capsules (600mg ) at bedtime 02/16/22   [provider]  glipiZIDE (GLUCOTROL) 10 MG tablet TAKE 1 TABLET BY MOUTH 2 TIMES DAILY BEFORE MEALS 06/30/22 06/30/23  Grayce Sessions, NP  Lancet Devices [facility] DELICA PLUS LANCING) MISC Use as directed 03/24/22     levofloxacin (LEVAQUIN) 500 MG tablet Take 1 tablet (500 mg total) by mouth daily. 10/14/22   Wallis Bamberg, PA-C  lisinopril (ZESTRIL) 20 MG tablet Take 1 tablet (20 mg total) by mouth daily. 06/30/22   Grayce Sessions, NP  metFORMIN (GLUCOPHAGE) 1000 MG tablet Take 1 tablet (1,000 mg total) by mouth 2 (two) times daily with a meal. 06/30/22   Grayce Sessions,  NP  ondansetron (ZOFRAN-ODT) 4 MG disintegrating tablet Dissolve 1 tablet (4 mg total) in mouth every 8 (eight) hours as needed for nausea Patient not taking: Reported on 07/20/2022 07/05/22     ondansetron (ZOFRAN-ODT) 4 MG disintegrating tablet Dissolve 1 tablet (4 mg total) by mouth every 12 (twelve) hours as needed for nausea. Take 20 minutes prior to daily Ceftriaxone dose. 07/26/22     oxyCODONE (OXY IR/ROXICODONE) 5 MG immediate release tablet Take 1 tablet (5 mg total) by mouth every 6 (six) hours as  needed for up to 7 days for Severe pain (7-10) or Moderate pain (4-6). 07/05/22     pantoprazole (PROTONIX) 40 MG tablet Take by mouth.    [provider]  PROAIR HFA 108 [phone] Base) MCG/ACT inhaler INHALE 2 PUFFS BY MOUTH INTO LUNGS EVERY 6 HOURS AS NEEDED FOR WHEEZING OR SHORTNESS OF BREATH 03/23/20 03/23/21  Grayce Sessions, NP  rosuvastatin (CRESTOR) 20 MG tablet Take 1 tablet (20 mg total) by mouth daily. 08/04/22   Hoy Register, MD  spironolactone (ALDACTONE) 100 MG tablet Take 1 tablet by mouth daily.    [provider]      Allergies    Propoxyphene n-acetaminophen    Review of Systems   Review of Systems  Constitutional:  Positive for fever. Negative for chills.  Respiratory:  Negative for shortness of breath.   Cardiovascular:  Negative for chest pain.  Gastrointestinal:  Negative for abdominal pain, constipation, diarrhea, nausea and vomiting.  Genitourinary:  Positive for dysuria, hematuria, scrotal swelling and testicular pain.  Musculoskeletal:  Positive for back pain.    Physical Exam Updated Vital Signs BP 121/83   Pulse 88   Temp 98.2 F (36.8 C) (Oral)   Resp 18   Ht 5\' 11"  (1.803 m)   Wt 93.4 kg   SpO2 100%   BMI 28.73 kg/m  Physical Exam Vitals and nursing note reviewed. Exam conducted with a chaperone present Thayer Ohm, Charity fundraiser).  Constitutional:      General: She is not in acute distress.    Appearance: Normal appearance. She is not toxic-appearing.     Comments: On phone in no acute distress  Eyes:     General: No scleral icterus. Pulmonary:     Effort: Pulmonary effort is normal. No respiratory distress.  Abdominal:     Palpations: Abdomen is soft.     Tenderness: There is abdominal tenderness. There is no guarding or rebound.     Comments: Mild very low suprapubic tenderness. Soft.   Genitourinary:    Comments: Significant swelling and pain noted to the right testicle. Testicle is firm and hardened. Does not involve the perineum.  Increase in warmth and erythema. No wounds seen.  Skin:    General: Skin is dry.     Findings: No rash.  Neurological:     General: No focal deficit present.     Mental Status: She is alert. Mental status is at baseline.  Psychiatric:        Mood and Affect: Mood normal.     ED Results / Procedures / Treatments   Labs (all labs ordered are listed, but only abnormal results are displayed) Labs Reviewed  CBC WITH DIFFERENTIAL/PLATELET  COMPREHENSIVE METABOLIC PANEL  URINALYSIS, ROUTINE W REFLEX MICROSCOPIC    EKG None  Radiology DG Foot Complete Right  Result Date: 10/16/2022 CLINICAL DATA:  Right lateral foot wound EXAM: RIGHT FOOT COMPLETE - 3+ VIEW COMPARISON:  05/08/2007 FINDINGS: Interval transmetatarsal amputation of the  right fifth digit with remote erosion or remote partial resection of the base of the residual right fifth metatarsal. No erosions or abnormal periosteal reaction. No acute fracture or dislocation. 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 Soft tissues are unremarkable. IMPRESSION: 1. 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 No radiographic evidence of osteomyelitis. Electronically Signed   By: Helyn Numbers M.D.   On: 10/16/2022 23:25    Procedures Procedures   Medications Ordered in ED Medications  morphine (PF) 4 MG/ML injection 4 mg (4 mg Intravenous Given 10/16/22 2335)    ED Course/ Medical Decision Making/ A&P    Medical Decision Making Amount and/or Complexity of Data Reviewed Labs: ordered. Radiology: ordered.  Risk Prescription drug management.   52 y.o. adult presents to the ER for evaluation of dysuria, hematuria, testicular and scrotal pain and swelling. Differential diagnosis includes but is not limited to testicular torsion, abscess, epididymitis, orchitis, Fournier's gangrene, UTI. Vital signs unremarkable. Physical exam as noted above.   Will proceed with CT scan.  Ultrasound will be able for a few  hours.  Labs ordered as well as pain medication.  From looking at patient's previous visit, they were seen on 10-14-2022 and diagnosed with orchitis and epididymitis.  Placed on levofloxacin after getting IM ceftriaxone.  From physical exam, pain and swelling does appear worse today.  XR of the foot shows 1. 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 No radiographic evidence of osteomyelitis. The patient reports it looks better to them. There is no surrounding warmth or erythema. Can not probe to bone. Low suspicion for osteomyelitis.   Will hand off patient to oncoming shift for follow up on labs and imaging. Disposition to be determined at that time.    Final Clinical Impression(s) / ED Diagnoses Final diagnoses:  None    Rx / DC Orders ED Discharge Orders     None         Achille Rich, Cordelia Poche 10/16/22 2352    Lorre Nick, MD 10/18/22 1334

## 2022-10-17 ENCOUNTER — Emergency Department (HOSPITAL_COMMUNITY): Payer: Medicaid Other

## 2022-10-17 DIAGNOSIS — N452 Orchitis: Secondary | ICD-10-CM | POA: Diagnosis not present

## 2022-10-17 DIAGNOSIS — N453 Epididymo-orchitis: Principal | ICD-10-CM | POA: Diagnosis present

## 2022-10-17 DIAGNOSIS — N433 Hydrocele, unspecified: Secondary | ICD-10-CM | POA: Diagnosis not present

## 2022-10-17 DIAGNOSIS — N200 Calculus of kidney: Secondary | ICD-10-CM | POA: Diagnosis not present

## 2022-10-17 LAB — BASIC METABOLIC PANEL
Anion gap: 9 (ref 5–15)
BUN: 17 mg/dL (ref 6–20)
CO2: 25 mmol/L (ref 22–32)
Calcium: 8.6 mg/dL — ABNORMAL LOW (ref 8.9–10.3)
Chloride: 98 mmol/L (ref 98–111)
Creatinine, Ser: 0.66 mg/dL (ref 0.61–1.24)
GFR, Estimated: >60 mL/min (ref >60)
Glucose, Bld: 193 mg/dL — ABNORMAL HIGH (ref 70–99)
Potassium: 3.4 mmol/L — ABNORMAL LOW (ref 3.5–5.1)
Sodium: 132 mmol/L — ABNORMAL LOW (ref 135–145)

## 2022-10-17 LAB — URINALYSIS, ROUTINE W REFLEX MICROSCOPIC
Bacteria, UA: NONE SEEN
Bilirubin Urine: NEGATIVE
Glucose, UA: 50 mg/dL — AB
Hgb urine dipstick: NEGATIVE
Ketones, ur: NEGATIVE mg/dL
Nitrite: NEGATIVE
Protein, ur: 100 mg/dL — AB
Specific Gravity, Urine: 1.03 (ref 1.005–1.030)
pH: 5 (ref 5.0–8.0)

## 2022-10-17 LAB — COMPREHENSIVE METABOLIC PANEL
ALT: 12 U/L (ref 0–44)
AST: 12 U/L — ABNORMAL LOW (ref 15–41)
Albumin: 3.2 g/dL — ABNORMAL LOW (ref 3.5–5.0)
Alkaline Phosphatase: 93 U/L (ref 38–126)
Anion gap: 9 (ref 5–15)
BUN: 20 mg/dL (ref 6–20)
CO2: 27 mmol/L (ref 22–32)
Calcium: 8.9 mg/dL (ref 8.9–10.3)
Chloride: 100 mmol/L (ref 98–111)
Creatinine, Ser: 0.67 mg/dL (ref 0.61–1.24)
GFR, Estimated: >60 mL/min (ref >60)
Glucose, Bld: 179 mg/dL — ABNORMAL HIGH (ref 70–99)
Potassium: 4 mmol/L (ref 3.5–5.1)
Sodium: 136 mmol/L (ref 135–145)
Total Bilirubin: 0.6 mg/dL (ref 0.3–1.2)
Total Protein: 8.3 g/dL — ABNORMAL HIGH (ref 6.5–8.1)

## 2022-10-17 LAB — CBC
HCT: 34.4 % — ABNORMAL LOW (ref 39.0–52.0)
Hemoglobin: 11.6 g/dL — ABNORMAL LOW (ref 13.0–17.0)
MCH: 30.8 pg (ref 26.0–34.0)
MCHC: 33.7 g/dL (ref 30.0–36.0)
MCV: 91.2 fL (ref 80.0–100.0)
Platelets: 306 10*3/uL (ref 150–400)
RBC: 3.77 MIL/uL — ABNORMAL LOW (ref 4.22–5.81)
RDW: 14.3 % (ref 11.5–15.5)
WBC: 13.1 10*3/uL — ABNORMAL HIGH (ref 4.0–10.5)
nRBC: 0 % (ref 0.0–0.2)

## 2022-10-17 LAB — CBC WITH DIFFERENTIAL/PLATELET
Abs Immature Granulocytes: 0.07 10*3/uL (ref 0.00–0.07)
Basophils Absolute: 0 10*3/uL (ref 0.0–0.1)
Basophils Relative: 0 %
Eosinophils Absolute: 0.1 10*3/uL (ref 0.0–0.5)
Eosinophils Relative: 1 %
HCT: 36.7 % — ABNORMAL LOW (ref 39.0–52.0)
Hemoglobin: 12.4 g/dL — ABNORMAL LOW (ref 13.0–17.0)
Immature Granulocytes: 0 %
Lymphocytes Relative: 17 %
Lymphs Abs: 2.8 10*3/uL (ref 0.7–4.0)
MCH: 31.1 pg (ref 26.0–34.0)
MCHC: 33.8 g/dL (ref 30.0–36.0)
MCV: 92 fL (ref 80.0–100.0)
Monocytes Absolute: 0.9 10*3/uL (ref 0.1–1.0)
Monocytes Relative: 5 %
Neutro Abs: 12.3 10*3/uL — ABNORMAL HIGH (ref 1.7–7.7)
Neutrophils Relative %: 77 %
Platelets: 310 10*3/uL (ref 150–400)
RBC: 3.99 MIL/uL — ABNORMAL LOW (ref 4.22–5.81)
RDW: 14.4 % (ref 11.5–15.5)
WBC: 16.2 10*3/uL — ABNORMAL HIGH (ref 4.0–10.5)
nRBC: 0 % (ref 0.0–0.2)

## 2022-10-17 LAB — GLUCOSE, CAPILLARY
Glucose-Capillary: 207 mg/dL — ABNORMAL HIGH (ref 70–99)
Glucose-Capillary: 142 mg/dL — ABNORMAL HIGH (ref 70–99)
Glucose-Capillary: 183 mg/dL — ABNORMAL HIGH (ref 70–99)
Glucose-Capillary: 165 mg/dL — ABNORMAL HIGH (ref 70–99)

## 2022-10-17 LAB — LACTIC ACID, PLASMA
Lactic Acid, Venous: 1 mmol/L (ref 0.5–1.9)
Lactic Acid, Venous: 0.8 mmol/L (ref 0.5–1.9)

## 2022-10-17 LAB — CULTURE, BLOOD (ROUTINE X 2)

## 2022-10-17 MED ORDER — BICTEGRAVIR-EMTRICITAB-TENOFOV 50-200-25 MG PO TABS
1.0000 | ORAL_TABLET | Freq: Every day | ORAL | Status: DC
  Administered 2022-10-17 – 2022-10-19 (×3): 1 via ORAL
  Filled 2022-10-17 (×3): qty 1

## 2022-10-17 MED ORDER — LEVOFLOXACIN IN D5W 750 MG/150ML IV SOLN
750.0000 mg | INTRAVENOUS | Status: DC
  Administered 2022-10-17: 750 mg via INTRAVENOUS
  Filled 2022-10-17: qty 150

## 2022-10-17 MED ORDER — OXYCODONE HCL 5 MG PO TABS
5.0000 mg | ORAL_TABLET | ORAL | Status: DC | PRN
  Administered 2022-10-17 – 2022-10-20 (×13): 5 mg via ORAL
  Filled 2022-10-17 (×13): qty 1

## 2022-10-17 MED ORDER — INSULIN ASPART 100 UNIT/ML IJ SOLN: 0.0000 [IU] | Freq: Every day | INTRAMUSCULAR | Status: DC

## 2022-10-17 MED ORDER — SPIRONOLACTONE 100 MG PO TABS
100.0000 mg | ORAL_TABLET | Freq: Every day | ORAL | Status: DC
  Administered 2022-10-17 – 2022-10-20 (×4): 100 mg via ORAL
  Filled 2022-10-17 (×5): qty 1

## 2022-10-17 MED ORDER — TRAZODONE HCL 50 MG PO TABS: 25.0000 mg | ORAL_TABLET | Freq: Every evening | ORAL | Status: DC | PRN

## 2022-10-17 MED ORDER — ROSUVASTATIN CALCIUM 20 MG PO TABS
20.0000 mg | ORAL_TABLET | Freq: Every day | ORAL | Status: DC
  Administered 2022-10-17 – 2022-10-20 (×4): 20 mg via ORAL
  Filled 2022-10-17 (×3): qty 1
  Filled 2022-10-17: qty 2

## 2022-10-17 MED ORDER — ENOXAPARIN SODIUM 40 MG/0.4ML IJ SOSY
40.0000 mg | PREFILLED_SYRINGE | INTRAMUSCULAR | Status: DC
  Administered 2022-10-17 – 2022-10-20 (×4): 40 mg via SUBCUTANEOUS
  Filled 2022-10-17 (×4): qty 0.4

## 2022-10-17 MED ORDER — GENTAMICIN SULFATE 40 MG/ML IJ SOLN
5.0000 mg/kg | Freq: Once | INTRAVENOUS | Status: AC
  Administered 2022-10-17: 470 mg via INTRAVENOUS
  Filled 2022-10-17: qty 11.75

## 2022-10-17 MED ORDER — PIPERACILLIN-TAZOBACTAM 3.375 G IVPB
3.3750 g | Freq: Three times a day (TID) | INTRAVENOUS | Status: DC
  Administered 2022-10-17 – 2022-10-18 (×3): 3.375 g via INTRAVENOUS
  Filled 2022-10-17 (×3): qty 50

## 2022-10-17 MED ORDER — ONDANSETRON HCL 4 MG PO TABS: 4.0000 mg | ORAL_TABLET | Freq: Four times a day (QID) | ORAL | Status: DC | PRN

## 2022-10-17 MED ORDER — INSULIN ASPART 100 UNIT/ML IJ SOLN
0.0000 [IU] | Freq: Three times a day (TID) | INTRAMUSCULAR | Status: DC
  Administered 2022-10-17 – 2022-10-19 (×5): 1 [IU] via SUBCUTANEOUS
  Administered 2022-10-20: 2 [IU] via SUBCUTANEOUS
  Administered 2022-10-20: 1 [IU] via SUBCUTANEOUS

## 2022-10-17 MED ORDER — ALBUTEROL SULFATE (2.5 MG/3ML) 0.083% IN NEBU: 2.5000 mg | INHALATION_SOLUTION | RESPIRATORY_TRACT | Status: DC | PRN

## 2022-10-17 MED ORDER — IOHEXOL 300 MG/ML  SOLN
100.0000 mL | Freq: Once | INTRAMUSCULAR | Status: AC | PRN
  Administered 2022-10-17: 100 mL via INTRAVENOUS

## 2022-10-17 MED ORDER — IBUPROFEN 200 MG PO TABS: 400.0000 mg | ORAL_TABLET | Freq: Four times a day (QID) | ORAL | Status: DC | PRN

## 2022-10-17 MED ORDER — ONDANSETRON HCL 4 MG/2ML IJ SOLN: 4.0000 mg | Freq: Four times a day (QID) | INTRAMUSCULAR | Status: DC | PRN

## 2022-10-17 MED ORDER — ESCITALOPRAM OXALATE 20 MG PO TABS
20.0000 mg | ORAL_TABLET | Freq: Every day | ORAL | Status: DC
  Administered 2022-10-17 – 2022-10-19 (×3): 20 mg via ORAL
  Filled 2022-10-17 (×3): qty 1

## 2022-10-17 NOTE — H&P (Signed)
History and Physical  PIERSON GAINOUS [name]:[id number] DOB: [birth year] DOA: 10/16/2022  PCP: Grayce Sessions, NP   Chief Complaint: Right testicular pain  HPI: [name] is a 52 y.o. adult male to male transgender with medical history significant for poorly controlled insulin-dependent type 2 diabetes, HIV on Biktarvy, being admitted with orchitis/epididymitis.  Patient started having testicular pain last week, swelling of the right testicle, dysuria and fevers.  Has not been sexually active for several years, went to urgent care on 5/3 on the day the symptoms started, was given empiric Levaquin and Rocephin shot.  Swelling and pain continued, so came for evaluation today to the emergency department.  Denies any cough, chest pain, nausea, vomiting.  ED Course: Evaluation in the emergency department reveals normal vital signs, lab work was done and shows WBC 16 K.  Hemoglobin 12, labs otherwise unremarkable.  Lactate 1.0.  Patient had CT scan, as well as scrotal ultrasound which showed evidence of orchitis/epididymitis.  Started on empiric IV Levaquin and gentamicin and hospitalist contacted for admission.  Review of Systems: Please see HPI for pertinent positives and negatives. A complete 10 system review of systems are otherwise negative.  Past Medical History:  Diagnosis Date   Anxiety    Asthma    Depression    Diabetes mellitus    Gait abnormality    GERD (gastroesophageal reflux disease)    HIV disease (HCC)    Hypertension    Loss of balance 09/14/2020   MRSA carrier    neck   Perirectal abscess 03/30/2017   Pneumonia    Polyuria 04/27/2015   Poorly controlled diabetes mellitus (HCC) 04/27/2015   Testicular mass 06/20/2016   Transgender 04/27/2015   Past Surgical History:  Procedure Laterality Date   APPENDECTOMY     TRANSFORAMINAL LUMBAR INTERBODY FUSION (TLIF) WITH PEDICLE SCREW FIXATION 3 LEVEL N/A 01/09/2019   Procedure: TRANSFORAMINAL LUMBAR INTERBODY FUSION (TLIF)  WITH PEDICLE SCREW FIXATION 3 LEVEL, POSTERIOR SPINAL FUSION;  Surgeon: Venetia Night, MD;  Location: [facility] ORS;  Service: Neurosurgery;  Laterality: N/A;    Social History:  reports that she has been smoking cigarettes. She has a 2.70 pack-year smoking history. She has never used smokeless tobacco. She reports that she does not currently use drugs after having used the following drugs: Marijuana. She reports that she does not drink alcohol.   Allergies  Allergen Reactions   Propoxyphene N-Acetaminophen Other (See Comments)    Stomach cramps    Family History  Problem Relation Age of Onset   Other Mother        unsure of history   Heart failure Father    Diabetes Father      Prior to Admission medications   Medication Sig Start Date End Date Taking? Authorizing Provider  acetaminophen (TYLENOL) 325 MG tablet Take 2 tablets (650 mg total) by mouth every 6 (six) hours as needed for moderate pain. 10/14/22  Yes Wallis Bamberg, PA-C  bictegravir-emtricitabine-tenofovir AF (BIKTARVY) 50-200-25 MG TABS tablet Take 1 tablet by mouth daily. 07/20/22  Yes Blanchard Kelch, NP  Dulaglutide (TRULICITY) 1.5 MG/0.5ML SOPN Inject 1.5 mg into the skin once a week. 08/04/22  Yes Hoy Register, MD  escitalopram (LEXAPRO) 20 MG tablet TAKE 1 TABLET BY MOUTH AT BEDTIME 06/30/22 06/30/23 Yes Grayce Sessions, NP  estradiol (ESTRACE) 2 MG tablet Take 1 tablet (2 mg total) by mouth daily. 07/20/22  Yes Blanchard Kelch, NP  levofloxacin (LEVAQUIN) 500 MG tablet Take 1 tablet (  500 mg total) by mouth daily. 10/14/22  Yes Wallis Bamberg, PA-C  metFORMIN (GLUCOPHAGE) 1000 MG tablet Take 1 tablet (1,000 mg total) by mouth 2 (two) times daily with a meal. 06/30/22  Yes Grayce Sessions, NP  rosuvastatin (CRESTOR) 20 MG tablet Take 1 tablet (20 mg total) by mouth daily. 08/04/22  Yes Hoy Register, MD  spironolactone (ALDACTONE) 100 MG tablet Take 1 tablet by mouth daily.   Yes [provider]  ACCU-CHEK  SOFTCLIX LANCETS lancets 1 each by Other route 3 (three) times daily. 12/06/17   [name], PA-C  blood glucose meter kit and supplies Dispense based on patient and insurance preference. Use up to four times daily as directed. (FOR ICD-10 E10.9, E11.9). 09/02/20   Grayce Sessions, NP  docusate sodium (COLACE CLEAR) 50 MG capsule Take 1 capsule (50 mg total) by mouth 2 times daily. Patient not taking: Reported on 07/20/2022 07/05/22     glipiZIDE (GLUCOTROL) 10 MG tablet TAKE 1 TABLET BY MOUTH 2 TIMES DAILY BEFORE MEALS 06/30/22 06/30/23  Grayce Sessions, NP  Lancet Devices [facility] DELICA PLUS LANCING) MISC Use as directed 03/24/22     lisinopril (ZESTRIL) 20 MG tablet Take 1 tablet (20 mg total) by mouth daily. Patient not taking: Reported on 10/17/2022 06/30/22   Grayce Sessions, NP  ondansetron (ZOFRAN-ODT) 4 MG disintegrating tablet Dissolve 1 tablet (4 mg total) in mouth every 8 (eight) hours as needed for nausea Patient not taking: Reported on 07/20/2022 07/05/22     ondansetron (ZOFRAN-ODT) 4 MG disintegrating tablet Dissolve 1 tablet (4 mg total) by mouth every 12 (twelve) hours as needed for nausea. Take 20 minutes prior to daily Ceftriaxone dose. Patient not taking: Reported on 10/17/2022 07/26/22     oxyCODONE (OXY IR/ROXICODONE) 5 MG immediate release tablet Take 1 tablet (5 mg total) by mouth every 6 (six) hours as needed for up to 7 days for Severe pain (7-10) or Moderate pain (4-6). Patient not taking: Reported on 10/17/2022 07/05/22     PROAIR HFA 108 (90 Base) MCG/ACT inhaler INHALE 2 PUFFS BY MOUTH INTO LUNGS EVERY 6 HOURS AS NEEDED FOR WHEEZING OR SHORTNESS OF BREATH 03/23/20 03/23/21  Grayce Sessions, NP    Physical Exam: BP 114/73   Pulse 88   Temp 98.6 F (37 C) (Oral)   Resp 17   Ht 5\' 11"  (1.803 m)   Wt 93.4 kg   SpO2 99%   BMI 28.73 kg/m   General:  Alert, oriented, calm, in no acute distress, resting comfortably on the stretcher in the ER, looks nontoxic  in appearance.  Pleasant cooperative.  Good historian. Eyes: EOMI, clear conjuctivae, white sclerea Neck: supple, no masses, trachea mildline  Cardiovascular: RRR, no murmurs or rubs, no peripheral edema  Respiratory: clear to auscultation bilaterally, no wheezes, no crackles  Abdomen: soft, nontender, nondistended, normal bowel tones heard  Skin: dry, no rashes  GU: Scrotal swelling, with overlying right testicular erythema, no open areas, or drainage. Musculoskeletal: no joint effusions, normal range of motion  Psychiatric: appropriate affect, normal speech  Neurologic: extraocular muscles intact, clear speech, moving all extremities with intact sensorium          Labs on Admission:  Basic Metabolic Panel: Recent Labs  Lab 10/16/22 2341  NA 136  K 4.0  CL 100  CO2 27  GLUCOSE 179*  BUN 20  CREATININE 0.67  CALCIUM 8.9   Liver Function Tests: Recent Labs  Lab 10/16/22 2341  AST 12*  ALT 12  ALKPHOS 93  BILITOT 0.6  PROT 8.3*  ALBUMIN 3.2*   No results for input(s): "LIPASE", "AMYLASE" in the last 168 hours. No results for input(s): "AMMONIA" in the last 168 hours. CBC: Recent Labs  Lab 10/16/22 2341  WBC 16.2*  NEUTROABS 12.3*  HGB 12.4*  HCT 36.7*  MCV 92.0  PLT 310   Cardiac Enzymes: No results for input(s): "CKTOTAL", "CKMB", "CKMBINDEX", "TROPONINI" in the last 168 hours.  BNP (last 3 results) No results for input(s): "BNP" in the last 8760 hours.  ProBNP (last 3 results) No results for input(s): "PROBNP" in the last 8760 hours.  CBG: No results for input(s): "GLUCAP" in the last 168 hours.  Radiological Exams on Admission: US SCROTUM W/DOPPLER  Result Date: 10/17/2022 CLINICAL DATA:  Right testicular pain EXAM: SCROTAL ULTRASOUND DOPPLER ULTRASOUND OF THE TESTICLES TECHNIQUE: Complete ultrasound examination of the testicles, epididymis, and other scrotal structures was performed. Color and spectral Doppler ultrasound were also utilized to  evaluate blood flow to the testicles. COMPARISON:  None Available. FINDINGS: Right testicle Measurements: 4.0 x 3.4 x 3.5 cm. Diffusely increased color flow vascularity. Normal parenchymal echogenicity and echotexture. No mass or microlithiasis visualized. Left testicle Measurements: 3.1 x 2.2 x 2.4 cm. Normal color flow vascularity. Normal parenchymal echogenicity and echotexture. No mass or microlithiasis visualized. Right epididymis: Mildly thickened and heterogeneous. Diffusely increased vascularity. Left epididymis:  Normal in size and appearance. Hydrocele: A complex, moderate right hydrocele is present with echogenic debris and numerous septa Varicocele:  None visualized. Other: There is mild relative thickening of the right scrotal wall. Pulsed Doppler interrogation of both testes demonstrates normal low resistance arterial and venous waveforms bilaterally. IMPRESSION: 1. Findings most consistent with right epididymo-orchitis. 2. Complex, moderate right hydrocele with echogenic debris and numerous septa. 3. Mild relative thickening of the right scrotal wall, likely reactive. Electronically Signed   By: Helyn Numbers M.D.   On: 10/17/2022 02:39   CT PELVIS W CONTRAST  Result Date: 10/17/2022 CLINICAL DATA:  Scrotal swelling or edema EXAM: CT PELVIS WITH CONTRAST TECHNIQUE: Multidetector CT imaging of the pelvis was performed using the standard protocol following the bolus administration of intravenous contrast. RADIATION DOSE REDUCTION: This exam was performed according to the departmental dose-optimization program which includes automated exposure control, adjustment of the mA and/or kV according to patient size and/or use of iterative reconstruction technique. CONTRAST:  OMNIPAQUE IOHEXOL 300 MG/ML  SOLN COMPARISON:  05/02/2017 FINDINGS: Urinary Tract: Punctate nonobstructing stone in the lower pole of the right kidney. No hydronephrosis. Urinary bladder grossly unremarkable. Bowel: Moderate stool  burden throughout the visualized large bowel. Small bowel decompressed within the pelvis. Vascular/Lymphatic: Scattered calcifications. No aneurysm, dissection or significant stenosis. Reproductive: Prostate unremarkable. Bilateral hydroceles noted. Right testicle visualized. Left testicle not visualized. Other:  No free fluid or free air. Musculoskeletal: No acute bony abnormality. Postoperative changes in the lower lumbar spine. IMPRESSION: Bilateral scrotal hydroceles noted. If there is clinical concern for testicular abnormality, ultrasound would be a more sensitive test. Moderate stool burden throughout the colon. Punctate right lower pole nephrolithiasis. Electronically Signed   By: Charlett Nose M.D.   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  No erosions or abnormal periosteal reaction. No acute fracture or dislocation. 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 Soft tissues are unremarkable. IMPRESSION: 1. 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 No radiographic evidence of osteomyelitis. Electronically Signed   By: Helyn Numbers M.D.   On: 10/16/2022 23:25    Assessment/Plan 52 year old male to male transgender with a history of poorly controlled insulin-dependent type 2 diabetes, HIV, admitted to the hospital with several days of right testicular pain and swelling that has failed outpatient management. -Observation admission  -Outpatient urine culture reviewed, consistent with resistant E. coli.  Only intermittent sensitivity to fluoroquinolones.  Will treat with IV Zosyn. -Pain and nausea control as needed    Human immunodeficiency virus (HIV) disease (HCC)    Male-to-male transgender [name]   Poorly controlled diabetes mellitus (HCC)-diabetic diet when eating, with moderate dose sliding scale insulin  DVT prophylaxis: Lovenox     Code Status: Full Code  Consults called: None  Admission status: Observation  Time spent: 49 minutes  Ahmari Garton Sharlette Dense MD [facility] Pager [phone]  If 7PM-7AM, please contact night-coverage [email or web address] Password [facility]  10/17/2022, 4:24 AM

## 2022-10-17 NOTE — Progress Notes (Signed)
PROGRESS NOTE    [name]  EAV:[id number] DOB: [birth year] DOA: 10/16/2022 PCP: Grayce Sessions, NP   Brief Narrative:  [name] is a 52 y.o. adult male to male transgender with medical history significant for poorly controlled insulin-dependent type 2 diabetes, HIV on Biktarvy, being admitted with orchitis/epididymitis -symptom onset 1 week prior to admission. Urgent care evaluated and treated with levaquin/rocephin - minimal improvement. Hospitalist called for admission in the setting of failed outpatient therapy.   Assessment & Plan:   Principal Problem:   Orchitis Active Problems:   Human immunodeficiency virus (HIV) disease (HCC)   Male-to-male transgender person   Smoker   Poorly controlled diabetes mellitus (HCC)   DM type 2 with diabetic peripheral neuropathy (HCC)   SEPSIS secondary to acute epididymitis/orchitis with failure of outpatient therapy Rule out concurrent UTI - Tachypnea, tachycardia, leukocytosis with noted source - Transition to zosyn - was on levaquin/ceftriaxone outpatient. - Preliminary urine culture positive for ecoli   HIV, poorly controlled - Follows with ID (Dr [name]) -Continue biktarvy - HIV 150 in January; DC4 count 1350 at the same appointment  Uncontrolled DM A1C 8.7 Continue Sliding scale for coverage/hypoglycemic protocol   DVT prophylaxis: Lovenox Code Status: Full Family Communication: None present  Status is: Inpt  Dispo: The patient is from: Home              Anticipated d/c is to: Home              Anticipated d/c date is: 24-48h              Patient currently NOT medically stable for discharge  Consultants:  None  Procedures:  None  Antimicrobials:  Zosyn   Subjective: No acute issues/events overnight, pain well controlled currently  Objective: Vitals:   10/17/22 0342 10/17/22 0641 10/17/22 0700 10/17/22 0800  BP: 114/73  121/76 110/74  Pulse: 88  80 77  Resp: 17  18 17   Temp:  (!) 97.5  F (36.4 C)  98.2 F (36.8 C)  TempSrc:  Oral  Oral  SpO2: 99%  94% 96%  Weight:      Height:       No intake or output data in the 24 hours ending 10/17/22 0819 Filed Weights   10/16/22 2015  Weight: 93.4 kg    Examination:  General:  Pleasantly resting in bed, No acute distress. HEENT:  Normocephalic atraumatic.  Sclerae nonicteric, noninjected.  Extraocular movements intact bilaterally. Neck:  Without mass or deformity.  Trachea is midline. Lungs:  Clear to auscultate bilaterally without rhonchi, wheeze, or rales. Heart:  Regular rate and rhythm.  Without murmurs, rubs, or gallops. Abdomen:  Soft, nontender, nondistended.  Without guarding or rebound. Extremities: Without cyanosis, clubbing, edema, or obvious deformity.  Data Reviewed: I have personally reviewed following labs and imaging studies  CBC: Recent Labs  Lab 10/16/22 2341 10/17/22 0645  WBC 16.2* 13.1*  NEUTROABS 12.3*  --   HGB 12.4* 11.6*  HCT 36.7* 34.4*  MCV 92.0 91.2  PLT 310 306   Basic Metabolic Panel: Recent Labs  Lab 10/16/22 2341 10/17/22 0645  NA 136 132*  K 4.0 3.4*  CL 100 98  CO2 27 25  GLUCOSE 179* 193*  BUN 20 17  CREATININE 0.67 0.66  CALCIUM 8.9 8.6*   GFR: Estimated Creatinine Clearance (by C-G formula based on SCr of 0.66 mg/dL) Male: 782.9 mL/min Male: 126 mL/min Liver Function Tests: Recent Labs  Lab 10/16/22  2341  AST 12*  ALT 12  ALKPHOS 93  BILITOT 0.6  PROT 8.3*  ALBUMIN 3.2*   Sepsis Labs: Recent Labs  Lab 10/17/22 0352 10/17/22 0645  LATICACIDVEN 1.0 0.8    Recent Results (from the past 240 hour(s))  Urine Culture     Status: Abnormal   Collection Time: 10/14/22 12:44 PM   Specimen: Urine, Clean Catch  Result Value Ref Range Status   Specimen Description URINE, CLEAN CATCH  Final   Special Requests   Final    NONE Performed at [facility], [address] [place], [place] [address]    Culture (A)  Final    >=100,000 COLONIES/mL  ESCHERICHIA COLI Confirmed Extended Spectrum Beta-Lactamase Producer (ESBL).  In bloodstream infections from ESBL organisms, carbapenems are preferred over piperacillin/tazobactam. They are shown to have a lower risk of mortality.    Report Status 10/16/2022 FINAL  Final   Organism ID, Bacteria ESCHERICHIA COLI (A)  Final      Susceptibility   Escherichia coli - MIC*    AMPICILLIN >=32 RESISTANT Resistant     CEFAZOLIN >=64 RESISTANT Resistant     CEFEPIME 4 INTERMEDIATE Intermediate     CEFTRIAXONE >=64 RESISTANT Resistant     CIPROFLOXACIN 1 RESISTANT Resistant     GENTAMICIN <=1 SENSITIVE Sensitive     IMIPENEM <=0.25 SENSITIVE Sensitive     NITROFURANTOIN <=16 SENSITIVE Sensitive     TRIMETH/SULFA >=320 RESISTANT Resistant     AMPICILLIN/SULBACTAM 4 SENSITIVE Sensitive     PIP/TAZO <=4 SENSITIVE Sensitive     * >=100,000 COLONIES/mL ESCHERICHIA COLI         Radiology Studies: US SCROTUM W/DOPPLER  Result Date: 10/17/2022 CLINICAL DATA:  Right testicular pain EXAM: SCROTAL ULTRASOUND DOPPLER ULTRASOUND OF THE TESTICLES TECHNIQUE: Complete ultrasound examination of the testicles, epididymis, and other scrotal structures was performed. Color and spectral Doppler ultrasound were also utilized to evaluate blood flow to the testicles. COMPARISON:  None Available. FINDINGS: Right testicle Measurements: 4.0 x 3.4 x 3.5 cm. Diffusely increased color flow vascularity. Normal parenchymal echogenicity and echotexture. No mass or microlithiasis visualized. Left testicle Measurements: 3.1 x 2.2 x 2.4 cm. Normal color flow vascularity. Normal parenchymal echogenicity and echotexture. No mass or microlithiasis visualized. Right epididymis: Mildly thickened and heterogeneous. Diffusely increased vascularity. Left epididymis:  Normal in size and appearance. Hydrocele: A complex, moderate right hydrocele is present with echogenic debris and numerous septa Varicocele:  None visualized. Other: There is  mild relative thickening of the right scrotal wall. Pulsed Doppler interrogation of both testes demonstrates normal low resistance arterial and venous waveforms bilaterally. IMPRESSION: 1. Findings most consistent with right epididymo-orchitis. 2. Complex, moderate right hydrocele with echogenic debris and numerous septa. 3. Mild relative thickening of the right scrotal wall, likely reactive. Electronically Signed   By: Helyn Numbers M.D.   On: 10/17/2022 02:39   CT PELVIS W CONTRAST  Result Date: 10/17/2022 CLINICAL DATA:  Scrotal swelling or edema EXAM: CT PELVIS WITH CONTRAST TECHNIQUE: Multidetector CT imaging of the pelvis was performed using the standard protocol following the bolus administration of intravenous contrast. RADIATION DOSE REDUCTION: This exam was performed according to the departmental dose-optimization program which includes automated exposure control, adjustment of the mA and/or kV according to patient size and/or use of iterative reconstruction technique. CONTRAST:  OMNIPAQUE IOHEXOL 300 MG/ML  SOLN COMPARISON:  05/02/2017 FINDINGS: Urinary Tract: Punctate nonobstructing stone in the lower pole of the right kidney. No hydronephrosis. Urinary bladder  grossly unremarkable. Bowel: Moderate stool burden throughout the visualized large bowel. Small bowel decompressed within the pelvis. Vascular/Lymphatic: Scattered calcifications. No aneurysm, dissection or significant stenosis. Reproductive: Prostate unremarkable. Bilateral hydroceles noted. Right testicle visualized. Left testicle not visualized. Other:  No free fluid or free air. Musculoskeletal: No acute bony abnormality. Postoperative changes in the lower lumbar spine. IMPRESSION: Bilateral scrotal hydroceles noted. If there is clinical concern for testicular abnormality, ultrasound would be a more sensitive test. Moderate stool burden throughout the colon. Punctate right lower pole nephrolithiasis. Electronically Signed   By: Charlett Nose M.D.   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 No erosions or abnormal periosteal reaction. No acute fracture or dislocation. 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 Soft tissues are unremarkable. IMPRESSION: 1. 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 No radiographic evidence of osteomyelitis. Electronically Signed   By: Helyn Numbers M.D.   On: 10/16/2022 23:25        Scheduled Meds:  enoxaparin (LOVENOX) injection  40 mg Subcutaneous Q24H   escitalopram  20 mg Oral QHS   rosuvastatin  20 mg Oral Daily   spironolactone  100 mg Oral Daily   Continuous Infusions:  piperacillin-tazobactam (ZOSYN)  IV       LOS: 0 days   Time spent:  Azucena Fallen, DO [facility]  If 7PM-7AM, please contact night-coverage [email or web address]  10/17/2022, 8:19 AM

## 2022-10-17 NOTE — ED Notes (Signed)
ED TO INPATIENT HANDOFF REPORT  Name/Age/Gender [name] 52 y.o. adult  Code Status    Code Status Orders  (From admission, onward)           Start     Ordered   10/17/22 0415  Full code  Continuous       Question:  By:  Answer:  Consent: discussion documented in EHR   10/17/22 0414           Code Status History     Date Active Date Inactive Code Status Order ID Comments User Context   01/09/2019 1440 01/11/2019 1646 Full Code [id number]  Ivar Drape, PA-C Inpatient   10/31/2017 4098 11/01/2017 2109 Full Code [id number]  Briscoe Deutscher, MD ED   05/31/2016 1953 06/02/2016 1729 Full Code [id number]  Clydie Braun, MD ED       Home/SNF/Other Home  Chief Complaint Orchitis [N45.2]  Level of Care/Admitting Diagnosis ED Disposition     ED Disposition  Admit   Condition  --   Comment  Hospital Area: [facility] [[id number]]  Level of Care: Med-Surg [16]  May place patient in observation at [facility] or [facility] if equivalent level of care is available:: Yes  Covid Evaluation: Asymptomatic - no recent exposure (last 10 days) testing not required  Diagnosis: Orchitis [202279]  Admitting Physician: Maryln Gottron [[id number]]  Attending Physician: [facility], MIR [email or web address] [[id number]]          Medical History Past Medical History:  Diagnosis Date   Anxiety    Asthma    Depression    Diabetes mellitus    Gait abnormality    GERD (gastroesophageal reflux disease)    HIV disease (HCC)    Hypertension    Loss of balance 09/14/2020   MRSA carrier    neck   Perirectal abscess 03/30/2017   Pneumonia    Polyuria 04/27/2015   Poorly controlled diabetes mellitus (HCC) 04/27/2015   Testicular mass 06/20/2016   Transgender 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 01/09/19  1246  Back  1377            Labs/Imaging Results for orders placed or performed during the hospital encounter of 10/16/22 (from the past 48 hour(s))  CBC with Differential     Status: Abnormal   Collection Time: 10/16/22 11:41 PM  Result Value Ref Range   WBC 16.2 (H) 4.0 - 10.5 K/uL   RBC 3.99 (L) 4.22 - 5.81 MIL/uL   Hemoglobin 12.4 (L) 13.0 - 17.0 g/dL   HCT 24.4 (L) 01.0 - 27.2 %   MCV 92.0 80.0 - 100.0 fL   MCH 31.1 26.0 - 34.0 pg   MCHC 33.8 30.0 - 36.0 g/dL   RDW 53.6 64.4 - 03.4 %   Platelets 310 150 - 400 K/uL   nRBC 0.0 0.0 - 0.2 %   Neutrophils Relative % 77 %   Neutro Abs 12.3 (H) 1.7 - 7.7 K/uL   Lymphocytes Relative 17 %   Lymphs Abs 2.8 0.7 - 4.0 K/uL   Monocytes Relative 5 %  Monocytes Absolute 0.9 0.1 - 1.0 K/uL   Eosinophils Relative 1 %   Eosinophils Absolute 0.1 0.0 - 0.5 K/uL   Basophils Relative 0 %   Basophils Absolute 0.0 0.0 - 0.1 K/uL   Immature Granulocytes 0 %   Abs Immature Granulocytes 0.07 0.00 - 0.07 K/uL    Comment: Performed at [facility], [address] [place], [place] [address]  Comprehensive metabolic panel     Status: Abnormal   Collection Time: 10/16/22 11:41 PM  Result Value Ref Range   Sodium 136 135 - 145 mmol/L   Potassium 4.0 3.5 - 5.1 mmol/L   Chloride 100 98 - 111 mmol/L   CO2 27 22 - 32 mmol/L   Glucose, Bld 179 (H) 70 - 99 mg/dL    Comment: Glucose reference range applies only to samples taken after fasting for at least 8 hours.   BUN 20 6 - 20 mg/dL   Creatinine, Ser 6.04 0.61 - 1.24 mg/dL   Calcium 8.9 8.9 - 54.0 mg/dL   Total Protein 8.3 (H) 6.5 - 8.1 g/dL   Albumin 3.2 (L) 3.5 - 5.0 g/dL   AST 12 (L) 15 - 41 U/L   ALT 12 0 - 44 U/L   Alkaline Phosphatase 93 38 - 126 U/L    Total Bilirubin 0.6 0.3 - 1.2 mg/dL   GFR, Estimated >98 >11 mL/min    Comment: (NOTE) Calculated using the CKD-EPI Creatinine Equation (2021)    Anion gap 9 5 - 15    Comment: Performed at [facility], [address], [place], [place] [address]  Urinalysis, Routine w reflex microscopic -Urine, Clean Catch     Status: Abnormal   Collection Time: 10/16/22 11:54 PM  Result Value Ref Range   Color, Urine YELLOW YELLOW   APPearance CLEAR CLEAR   Specific Gravity, Urine 1.030 1.005 - 1.030   pH 5.0 5.0 - 8.0   Glucose, UA 50 (A) NEGATIVE mg/dL   Hgb urine dipstick NEGATIVE NEGATIVE   Bilirubin Urine NEGATIVE NEGATIVE   Ketones, ur NEGATIVE NEGATIVE mg/dL   Protein, ur 295 (A) NEGATIVE mg/dL   Nitrite NEGATIVE NEGATIVE   Leukocytes,Ua SMALL (A) NEGATIVE   RBC / HPF 0-5 0 - 5 RBC/hpf   WBC, UA 21-50 0 - 5 WBC/hpf   Bacteria, UA NONE SEEN NONE SEEN   Squamous Epithelial / HPF 0-5 0 - 5 /HPF   Mucus PRESENT     Comment: Performed at [facility], [address], [place], [place] [address]  Lactic acid, plasma     Status: None   Collection Time: 10/17/22  3:52 AM  Result Value Ref Range   Lactic Acid, Venous 1.0 0.5 - 1.9 mmol/L    Comment: Performed at [facility], [address] [place], [place] [address]  Lactic acid, plasma     Status: None   Collection Time: 10/17/22  6:45 AM  Result Value Ref Range   Lactic Acid, Venous 0.8 0.5 - 1.9 mmol/L    Comment: Performed at [facility], [address], [place], [place] [address]  CBC     Status: Abnormal   Collection Time: 10/17/22  6:45 AM  Result Value Ref Range   WBC 13.1 (H) 4.0 - 10.5 K/uL   RBC 3.77 (L) 4.22 - 5.81 MIL/uL   Hemoglobin 11.6 (L) 13.0 - 17.0 g/dL   HCT 95.2 (L) 84.1 - 32.4 %   MCV 91.2  80.0 - 100.0 fL   MCH 30.8 26.0 - 34.0 pg   MCHC 33.7 30.0 - 36.0 g/dL   RDW 82.9 56.2 - 13.0 %   Platelets 306 150 - 400 K/uL   nRBC 0.0 0.0 - 0.2 %     Comment: Performed at [facility], [address] [place], [place] [address]  Basic metabolic panel     Status: Abnormal   Collection Time: 10/17/22  6:45 AM  Result Value Ref Range   Sodium 132 (L) 135 - 145 mmol/L   Potassium 3.4 (L) 3.5 - 5.1 mmol/L   Chloride 98 98 - 111 mmol/L   CO2 25 22 - 32 mmol/L   Glucose, Bld 193 (H) 70 - 99 mg/dL    Comment: Glucose reference range applies only to samples taken after fasting for at least 8 hours.   BUN 17 6 - 20 mg/dL   Creatinine, Ser 4.69 0.61 - 1.24 mg/dL   Calcium 8.6 (L) 8.9 - 10.3 mg/dL   GFR, Estimated >62 >95 mL/min    Comment: (NOTE) Calculated using the CKD-EPI Creatinine Equation (2021)    Anion gap 9 5 - 15    Comment: Performed at [facility], [address] [place], [place] [address]   US SCROTUM W/DOPPLER  Result Date: 10/17/2022 CLINICAL DATA:  Right testicular pain EXAM: SCROTAL ULTRASOUND DOPPLER ULTRASOUND OF THE TESTICLES TECHNIQUE: Complete ultrasound examination of the testicles, epididymis, and other scrotal structures was performed. Color and spectral Doppler ultrasound were also utilized to evaluate blood flow to the testicles. COMPARISON:  None Available. FINDINGS: Right testicle Measurements: 4.0 x 3.4 x 3.5 cm. Diffusely increased color flow vascularity. Normal parenchymal echogenicity and echotexture. No mass or microlithiasis visualized. Left testicle Measurements: 3.1 x 2.2 x 2.4 cm. Normal color flow vascularity. Normal parenchymal echogenicity and echotexture. No mass or microlithiasis visualized. Right epididymis: Mildly thickened and heterogeneous. Diffusely increased vascularity. Left epididymis:  Normal in size and appearance. Hydrocele: A complex, moderate right hydrocele is present with echogenic debris and numerous septa Varicocele:  None visualized. Other: There is mild relative thickening of the right scrotal wall. Pulsed Doppler interrogation of both testes  demonstrates normal low resistance arterial and venous waveforms bilaterally. IMPRESSION: 1. Findings most consistent with right epididymo-orchitis. 2. Complex, moderate right hydrocele with echogenic debris and numerous septa. 3. Mild relative thickening of the right scrotal wall, likely reactive. Electronically Signed   By: Helyn Numbers M.D.   On: 10/17/2022 02:39   CT PELVIS W CONTRAST  Result Date: 10/17/2022 CLINICAL DATA:  Scrotal swelling or edema EXAM: CT PELVIS WITH CONTRAST TECHNIQUE: Multidetector CT imaging of the pelvis was performed using the standard protocol following the bolus administration of intravenous contrast. RADIATION DOSE REDUCTION: This exam was performed according to the departmental dose-optimization program which includes automated exposure control, adjustment of the mA and/or kV according to patient size and/or use of iterative reconstruction technique. CONTRAST:  OMNIPAQUE IOHEXOL 300 MG/ML  SOLN COMPARISON:  05/02/2017 FINDINGS: Urinary Tract: Punctate nonobstructing stone in the lower pole of the right kidney. No hydronephrosis. Urinary bladder grossly unremarkable. Bowel: Moderate stool burden throughout the visualized large bowel. Small bowel decompressed within the pelvis. Vascular/Lymphatic: Scattered calcifications. No aneurysm, dissection or significant stenosis. Reproductive: Prostate unremarkable. Bilateral hydroceles noted. Right testicle visualized. Left testicle not visualized. Other:  No free fluid or free air. Musculoskeletal: No acute bony abnormality. Postoperative changes in the lower lumbar spine. IMPRESSION: Bilateral scrotal hydroceles noted. If  there is clinical concern for testicular abnormality, ultrasound would be a more sensitive test. Moderate stool burden throughout the colon. Punctate right lower pole nephrolithiasis. Electronically Signed   By: Charlett Nose M.D.   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 No erosions or abnormal periosteal reaction. No acute fracture or dislocation. 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 Soft tissues are unremarkable. IMPRESSION: 1. 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 No radiographic evidence of osteomyelitis. Electronically Signed   By: Helyn Numbers M.D.   On: 10/16/2022 23:25    Pending Labs Unresulted Labs (From admission, onward)     Start     Ordered   10/24/22 0500  Creatinine, serum  (enoxaparin (LOVENOX)    CrCl >/= 30 ml/min)  Weekly,   R     Comments: while on enoxaparin therapy    10/17/22 0414   10/17/22 0500  CBC  Daily,   R      10/17/22 0412   10/17/22 0500  Basic metabolic panel  Daily,   R      10/17/22 0412   10/17/22 0337  Blood culture (routine x 2)  BLOOD CULTURE X 2,   R (with STAT occurrences)     Question:  Release to patient  Answer:  Immediate   10/17/22 0337            Vitals/Pain Today's Vitals   10/17/22 0342 10/17/22 0641 10/17/22 0641 10/17/22 0700  BP: 114/73   121/76  Pulse: 88   80  Resp: 17   18  Temp:   (!) 97.5 F (36.4 C)   TempSrc:   Oral   SpO2: 99%   94%  Weight:      Height:      PainSc:  3       Isolation Precautions No active isolations  Medications Medications  oxyCODONE (Oxy IR/ROXICODONE) immediate release tablet 5 mg (5 mg Oral Given 10/17/22 0432)  rosuvastatin (CRESTOR) tablet 20 mg (has no administration in time range)  spironolactone (ALDACTONE) tablet 100 mg (has no administration in time range)  escitalopram (LEXAPRO) tablet 20 mg (has no administration in time range)  enoxaparin (LOVENOX) injection 40 mg (has no administration in time range)  ibuprofen (ADVIL) tablet  400 mg (has no administration in time range)  traZODone (DESYREL) tablet 25 mg (has no administration in time range)  ondansetron (ZOFRAN) tablet 4 mg (has no administration in time range)    Or  ondansetron (ZOFRAN) injection 4 mg (has no administration in time range)  albuterol (PROVENTIL) (2.5 MG/3ML) 0.083% nebulizer solution 2.5 mg (has no administration in time range)  piperacillin-tazobactam (ZOSYN) IVPB 3.375 g (has no administration in time range)  morphine (PF) 4 MG/ML injection 4 mg (4 mg Intravenous Given 10/16/22 2335)  iohexol (OMNIPAQUE) 300 MG/ML solution 100 mL (100 mLs Intravenous Contrast Given 10/17/22 0044)  gentamicin (GARAMYCIN) 470 mg in dextrose 5 % 50 mL IVPB (0 mg Intravenous Stopped 10/17/22 0430)    Mobility walks

## 2022-10-17 NOTE — ED Provider Notes (Signed)
Care assumed from [facility], PA-C at shift change. Please see their note for further information.   Briefly: Patient with hx HIV on Biktarvy, T2DM, male to male transgender with male organs presents today with complaints of right testicular pain and swelling with dysuria and fevers. No recent sexual activity. Symptoms began Friday, went to UC and was given IM Rocephin and rx for levaquin which she has been taking as prescribed with no improvement.   Plan: Labs, [place], and CT imaging pending at shift change and will determine dispo.   Labs have resulted and reveal leukocytosis at 16.2. UA infectious. Culture from 5/03 shows susceptibility to macrobid only as far as oral antibiotics.   CT pelvis resulted and reveals  Bilateral scrotal hydroceles noted. If there is clinical concern for testicular abnormality, ultrasound would be a more sensitive test.   Moderate stool burden throughout the colon.   Punctate right lower pole nephrolithiasis.  US scrotum resulted and reveals  1. Findings most consistent with right epididymo-orchitis. 2. Complex, moderate right hydrocele with echogenic debris and numerous septa. 3. Mild relative thickening of the right scrotal wall, likely reactive.  I have personally reviewed and interpreted this imaging and agree with radiology interpretation.  Upon reassessment, patient still having testicular pain. Given her history of HIV, T2DM, recently requiring PICC line with IV antibiotics for osteomyelitis, leukocytosis at 16 and urine culture only susceptible to [facility], patient will require admission for IV antibiotics.  Discussed with patient who is understanding and in agreement.  Discussed patient with hospitalist who agrees to admit.  Findings and plan of care discussed with supervising physician Dr. Madilyn Hook who is in agreement.      Vear Clock 10/17/22 0415    Tilden Fossa, MD 10/17/22 [phone]

## 2022-10-18 DIAGNOSIS — N12 Tubulo-interstitial nephritis, not specified as acute or chronic: Secondary | ICD-10-CM | POA: Diagnosis not present

## 2022-10-18 DIAGNOSIS — N3001 Acute cystitis with hematuria: Secondary | ICD-10-CM | POA: Diagnosis not present

## 2022-10-18 DIAGNOSIS — F32A Depression, unspecified: Secondary | ICD-10-CM | POA: Diagnosis not present

## 2022-10-18 DIAGNOSIS — E1142 Type 2 diabetes mellitus with diabetic polyneuropathy: Secondary | ICD-10-CM | POA: Diagnosis not present

## 2022-10-18 DIAGNOSIS — N452 Orchitis: Secondary | ICD-10-CM | POA: Diagnosis not present

## 2022-10-18 DIAGNOSIS — I10 Essential (primary) hypertension: Secondary | ICD-10-CM | POA: Diagnosis not present

## 2022-10-18 DIAGNOSIS — Z981 Arthrodesis status: Secondary | ICD-10-CM | POA: Diagnosis not present

## 2022-10-18 DIAGNOSIS — N453 Epididymo-orchitis: Secondary | ICD-10-CM | POA: Diagnosis not present

## 2022-10-18 DIAGNOSIS — Z1624 Resistance to multiple antibiotics: Secondary | ICD-10-CM | POA: Diagnosis not present

## 2022-10-18 DIAGNOSIS — Z5982 Transportation insecurity: Secondary | ICD-10-CM | POA: Diagnosis not present

## 2022-10-18 DIAGNOSIS — Z8249 Family history of ischemic heart disease and other diseases of the circulatory system: Secondary | ICD-10-CM | POA: Diagnosis not present

## 2022-10-18 DIAGNOSIS — F17210 Nicotine dependence, cigarettes, uncomplicated: Secondary | ICD-10-CM | POA: Diagnosis not present

## 2022-10-18 DIAGNOSIS — Z794 Long term (current) use of insulin: Secondary | ICD-10-CM | POA: Diagnosis not present

## 2022-10-18 DIAGNOSIS — Z5941 Food insecurity: Secondary | ICD-10-CM | POA: Diagnosis not present

## 2022-10-18 DIAGNOSIS — N433 Hydrocele, unspecified: Secondary | ICD-10-CM | POA: Diagnosis not present

## 2022-10-18 DIAGNOSIS — B20 Human immunodeficiency virus [HIV] disease: Secondary | ICD-10-CM | POA: Diagnosis not present

## 2022-10-18 DIAGNOSIS — J45909 Unspecified asthma, uncomplicated: Secondary | ICD-10-CM | POA: Diagnosis not present

## 2022-10-18 DIAGNOSIS — N390 Urinary tract infection, site not specified: Secondary | ICD-10-CM | POA: Diagnosis not present

## 2022-10-18 DIAGNOSIS — Z833 Family history of diabetes mellitus: Secondary | ICD-10-CM | POA: Diagnosis not present

## 2022-10-18 DIAGNOSIS — E1165 Type 2 diabetes mellitus with hyperglycemia: Secondary | ICD-10-CM | POA: Diagnosis not present

## 2022-10-18 DIAGNOSIS — R1909 Other intra-abdominal and pelvic swelling, mass and lump: Secondary | ICD-10-CM

## 2022-10-18 DIAGNOSIS — Z79899 Other long term (current) drug therapy: Secondary | ICD-10-CM | POA: Diagnosis not present

## 2022-10-18 DIAGNOSIS — Z789 Other specified health status: Secondary | ICD-10-CM | POA: Diagnosis not present

## 2022-10-18 DIAGNOSIS — N200 Calculus of kidney: Secondary | ICD-10-CM | POA: Diagnosis not present

## 2022-10-18 DIAGNOSIS — F640 Transsexualism: Secondary | ICD-10-CM | POA: Diagnosis not present

## 2022-10-18 DIAGNOSIS — Z7984 Long term (current) use of oral hypoglycemic drugs: Secondary | ICD-10-CM | POA: Diagnosis not present

## 2022-10-18 DIAGNOSIS — B9620 Unspecified Escherichia coli [E. coli] as the cause of diseases classified elsewhere: Secondary | ICD-10-CM | POA: Diagnosis not present

## 2022-10-18 DIAGNOSIS — Z7985 Long-term (current) use of injectable non-insulin antidiabetic drugs: Secondary | ICD-10-CM | POA: Diagnosis not present

## 2022-10-18 DIAGNOSIS — Z8701 Personal history of pneumonia (recurrent): Secondary | ICD-10-CM | POA: Diagnosis not present

## 2022-10-18 LAB — BASIC METABOLIC PANEL
Anion gap: 10 (ref 5–15)
BUN: 15 mg/dL (ref 6–20)
CO2: 26 mmol/L (ref 22–32)
Calcium: 8.5 mg/dL — ABNORMAL LOW (ref 8.9–10.3)
Chloride: 96 mmol/L — ABNORMAL LOW (ref 98–111)
Creatinine, Ser: 0.73 mg/dL (ref 0.61–1.24)
GFR, Estimated: >60 mL/min (ref >60)
Glucose, Bld: 173 mg/dL — ABNORMAL HIGH (ref 70–99)
Potassium: 3.9 mmol/L (ref 3.5–5.1)
Sodium: 132 mmol/L — ABNORMAL LOW (ref 135–145)

## 2022-10-18 LAB — CBC
HCT: 38.1 % — ABNORMAL LOW (ref 39.0–52.0)
Hemoglobin: 12.8 g/dL — ABNORMAL LOW (ref 13.0–17.0)
MCH: 30.8 pg (ref 26.0–34.0)
MCHC: 33.6 g/dL (ref 30.0–36.0)
MCV: 91.8 fL (ref 80.0–100.0)
Platelets: 355 10*3/uL (ref 150–400)
RBC: 4.15 MIL/uL — ABNORMAL LOW (ref 4.22–5.81)
RDW: 14.1 % (ref 11.5–15.5)
WBC: 9.7 10*3/uL (ref 4.0–10.5)
nRBC: 0 % (ref 0.0–0.2)

## 2022-10-18 LAB — GLUCOSE, CAPILLARY
Glucose-Capillary: 147 mg/dL — ABNORMAL HIGH (ref 70–99)
Glucose-Capillary: 174 mg/dL — ABNORMAL HIGH (ref 70–99)
Glucose-Capillary: 177 mg/dL — ABNORMAL HIGH (ref 70–99)
Glucose-Capillary: 160 mg/dL — ABNORMAL HIGH (ref 70–99)

## 2022-10-18 LAB — CULTURE, BLOOD (ROUTINE X 2)

## 2022-10-18 MED ORDER — SODIUM CHLORIDE 0.9 % IV SOLN
1.0000 g | Freq: Three times a day (TID) | INTRAVENOUS | Status: DC
  Administered 2022-10-18 – 2022-10-20 (×6): 1 g via INTRAVENOUS
  Filled 2022-10-18 (×7): qty 20

## 2022-10-18 MED ORDER — DOCUSATE SODIUM 50 MG PO CAPS
50.0000 mg | ORAL_CAPSULE | Freq: Once | ORAL | Status: DC
  Filled 2022-10-18: qty 1

## 2022-10-18 MED ORDER — HYDROMORPHONE HCL 1 MG/ML IJ SOLN
0.5000 mg | INTRAMUSCULAR | Status: DC | PRN
  Administered 2022-10-18 – 2022-10-20 (×5): 0.5 mg via INTRAVENOUS
  Filled 2022-10-18 (×5): qty 0.5

## 2022-10-18 NOTE — Consult Note (Addendum)
[facility]                                                                                        Patient Identification: Patient Name: [name] MRN: [id number] Admit Date: 10/16/2022  8:13 PM Today's Date: 10/18/2022 Reason for consult: epididymitis, orchitis  Requesting provider: Dr Natale Milch   Principal Problem:   Orchitis Active Problems:   Human immunodeficiency virus (HIV) disease (HCC)   Male-to-male transgender person   Smoker   Poorly controlled diabetes mellitus (HCC)   DM type 2 with diabetic peripheral neuropathy (HCC)   Antibiotics:  Gentamicin 5/5 Levofloxacin 5/5 Zosyn 5/6-c  Lines/Hardware:  Assessment 52 Y O M to F transgender male with HIV on biktravy, DM, HTN, TLIF with instrumented fixation, ? 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  # RT epididymo orchitis  # Complex moderate rt hydrocele # Possible Pyelonephritis/non obstructive rt kidney stone # HIV - on biktarvy  Lab Results  Component Value Date   HIV1RNAQUANT 158 (H) 07/06/2022   Lab Results  Component Value Date   CD4TABS 1,357 07/06/2022   CD4TABS 1,171 01/27/2021   CD4TABS 2,210 09/12/2018    Comments - denies being sexually active and age greater than 35 years, so less likely STDs but agreed to testing for completeness. Most likely causative organism for epididymo-orchitis is ESBL E coli isolated from urine cx   Recommendations  Will switch zosyn to meropenem and plan for 14 days cousre  Urology evaluation given significant tenderness in the rt scrotal area and rt groin  Will get urine GC  Continue home ART with Biktarvy  Following   Rest of the management as per the primary team. Please call with questions or concerns.  Thank you for the  consult  __________________________________________________________________________________________________________ HPI and Hospital Course: 52 Y O M to F transgender male with HIV on biktravy, DM, HTN, TLIF  with instrumented fixation, ? 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 Was seen in the UC 5/3 with 2 days h/o dysuria, rt flank pain, rt low back pain and bloody urine. He was given 1 dose of ceftriaxone and was discharged on Levofloxacin course for concerns of epididymo-orchitis. STD testing was declined as not been sexually active for 3 years. However he returned to ED 5/5 with worsening pain and swelling of thr rt testicle with no improvement in dysuria and hematuria. Pain in the testicle radiating to the lower abdomen as well as subjective fevers. Denies nausea, vomiting and diarrhea.  Reports being compliant with Biktarvy but seemed to be less open to sexual history and was deferred.  At ED, afebrile  Labs remarkable for wbc 16.2 5/5 UA with pyuria. Urine cx 5/3 with ESBL E coli  5/6 blood cx 2/2 sets NGTD Imaging reviewed  ID consulted for epididymo orchitis as well as positive urine cultures   ROS: General- Denies fever,  chills, loss of appetite and loss of weight HEENT - Denies headache, blurry vision, neck pain, sinus pain Chest - Denies any chest pain, SOB or cough CVS- Denies any dizziness/lightheadedness, syncopal attacks, palpitations Abdomen- Denies any nausea, vomiting, abdominal pain, hematochezia and diarrhea Neuro - Denies any weakness, numbness, tingling sensation Psych - Denies any changes in mood irritability or depressive symptoms GU- as above, dysuria, hematuria, rt sided testicular pain + Skin - denies any rashes/lesions MSK - denies any joint pain/swelling or restricted ROM   Past Medical History:  Diagnosis Date    Anxiety    Asthma    Depression    Diabetes mellitus    Gait abnormality    GERD (gastroesophageal reflux disease)    HIV disease (HCC)    Hypertension    Loss of balance 09/14/2020   MRSA carrier    neck   Perirectal abscess 03/30/2017   Pneumonia    Polyuria 04/27/2015   Poorly controlled diabetes mellitus (HCC) 04/27/2015   Testicular mass 06/20/2016   Transgender 04/27/2015   Past Surgical History:  Procedure Laterality Date   APPENDECTOMY     TRANSFORAMINAL LUMBAR INTERBODY FUSION (TLIF) WITH PEDICLE SCREW FIXATION 3 LEVEL N/A 01/09/2019   Procedure: TRANSFORAMINAL LUMBAR INTERBODY FUSION (TLIF) WITH PEDICLE SCREW FIXATION 3 LEVEL, POSTERIOR SPINAL FUSION;  Surgeon: Venetia Night, MD;  Location: [facility] ORS;  Service: Neurosurgery;  Laterality: N/A;     Scheduled Meds:  bictegravir-emtricitabine-tenofovir AF  1 tablet Oral Daily   enoxaparin (LOVENOX) injection  40 mg Subcutaneous Q24H   escitalopram  20 mg Oral QHS   insulin aspart  0-5 Units Subcutaneous QHS   insulin aspart  0-6 Units Subcutaneous TID WC   rosuvastatin  20 mg Oral Daily   spironolactone  100 mg Oral Daily   Continuous Infusions:  piperacillin-tazobactam (ZOSYN)  IV 3.375 g (10/18/22 0559)   PRN Meds:.albuterol, ibuprofen, ondansetron **OR** ondansetron (ZOFRAN) IV, oxyCODONE, traZODone  Allergies  Allergen Reactions   Propoxyphene N-Acetaminophen Other (See Comments)    Stomach cramps   Social History   Socioeconomic History   Marital status: Single    Spouse name: Not on file   Number of children: 0   Years of education: 11th   Highest education level: Not on file  Occupational History   Occupation: Disabled   Tobacco Use   Smoking status: Every Day    Packs/day: 0.10    Years: 27.00    Additional pack years: 0.00    Total pack years: 2.70    Types: Cigarettes    Last attempt to quit: 07/19/2018    Years since quitting: 4.2   Smokeless tobacco: Never   Tobacco comments:    started  back yesterday  Vaping Use   Vaping Use: Never used  Substance and Sexual Activity   Alcohol use: No    Alcohol/week: 0.0 standard drinks of alcohol   Drug use: Not Currently    Types: Marijuana    Comment: 1-2 times per month   Sexual activity: Yes    Partners: Male    Comment: pt. given condoms  Other Topics Concern   Not on file  Social History Narrative   Drinks 64 ounces of Diet Pepper per day.    Left-handed.   Lives with a friend.   Social Determinants of Health   Financial Resource Strain: Not on file  Food Insecurity: Food Insecurity Present (10/17/2022)   Hunger Vital Sign    Worried About Running Out of Food in  the Last Year: Often true    Ran Out of Food in the Last Year: Often true  Transportation Needs: Unmet Transportation Needs (10/17/2022)   PRAPARE - Administrator, Civil Service (Medical): Yes    Lack of Transportation (Non-Medical): Yes  Physical Activity: Insufficiently Active (03/01/2021)   Exercise Vital Sign    Days of Exercise per Week: 7 days    Minutes of Exercise per Session: 20 min  Stress: Not on file  Social Connections: Not on file  Intimate Partner Violence: Not At Risk (10/17/2022)   Humiliation, Afraid, Rape, and Kick questionnaire    Fear of Current or Ex-Partner: No    Emotionally Abused: No    Physically Abused: No    Sexually Abused: No   Family History  Problem Relation Age of Onset   Other Mother        unsure of history   Heart failure Father    Diabetes Father     Vitals BP 118/72 (BP Location: Left Arm)   Pulse 81   Temp 98.7 F (37.1 C) (Oral)   Resp 14   Ht 5\' 11"  (1.803 m)   Wt 93.4 kg   SpO2 97%   BMI 28.73 kg/m    Physical Exam Constitutional: Adult male to male transgender, not in acute distress    Comments: HEENT WNL  Cardiovascular:     Rate and Rhythm: Normal rate and regular rhythm.     Heart sounds: s1s2  Pulmonary:     Effort: Pulmonary effort is normal.     Comments: Normal breath  sounds   Abdominal:     Palpations: Abdomen is soft.     Tenderness: non distended and nontender  Musculoskeletal:        General: No swelling or tenderness of joints  GU ( chaperoned by RN) Did not allow exam of penis and covered with hand  Rt scrotal area with some swelling and tenderness, not much in the left side , no open wounds, crepitus   Skin:    Comments: No rashes   Neurological:     General: Awake, alert and oriented, grossly nonfocal, follows commands  Psychiatric:        Mood and Affect: Mood normal.    Pertinent Microbiology Results for orders placed or performed during the hospital encounter of 10/16/22  Blood culture (routine x 2)     Status: None (Preliminary result)   Collection Time: 10/17/22  3:52 AM   Specimen: BLOOD  Result Value Ref Range Status   Specimen Description   Final    BLOOD BLOOD RIGHT HAND Performed at [facility], [address], [place], [place] [address]    Special Requests   Final    BOTTLES DRAWN AEROBIC AND ANAEROBIC Blood Culture results may not be optimal due to an inadequate volume of blood received in culture bottles Performed at [facility], [address], [place], [place] [address]    Culture   Final    NO GROWTH < 12 HOURS Performed at [facility], [address] [place], [place] [address]    Report Status PENDING  Incomplete    Pertinent Lab seen by me:    Latest Ref Rng & Units 10/17/2022    6:45 AM 10/16/2022   11:41 PM 07/06/2022    9:57 AM  CBC  WBC 4.0 - 10.5 K/uL 13.1  16.2  10.7   Hemoglobin 13.0 - 17.0 g/dL 11.6  12.4  16.1   Hematocrit 39.0 - 52.0 % 34.4  36.7  45.9   Platelets 150 - 400 K/uL 306  310  464       Latest Ref Rng & Units 10/17/2022    6:45 AM 10/16/2022   11:41 PM 07/06/2022    9:57 AM  CMP  Glucose 70 - 99 mg/dL 981  191  478   BUN 6 - 20 mg/dL 17  20  21    Creatinine 0.61 - 1.24 mg/dL 2.95  6.21  3.08   Sodium 135 - 145 mmol/L 132  136  138    Potassium 3.5 - 5.1 mmol/L 3.4  4.0  4.9   Chloride 98 - 111 mmol/L 98  100  101   CO2 22 - 32 mmol/L 25  27  27    Calcium 8.9 - 10.3 mg/dL 8.6  8.9  9.4   Total Protein 6.5 - 8.1 g/dL  8.3  8.1   Total Bilirubin 0.3 - 1.2 mg/dL  0.6  0.4   Alkaline Phos 38 - 126 U/L  93    AST 15 - 41 U/L  12  15   ALT 0 - 44 U/L  12  13      Pertinent Imagings/Other Imagings Plain films and CT images have been personally visualized and interpreted; radiology reports have been reviewed. Decision making incorporated into the Impression / Recommendations.  US SCROTUM W/DOPPLER  Result Date: 10/17/2022 CLINICAL DATA:  Right testicular pain EXAM: SCROTAL ULTRASOUND DOPPLER ULTRASOUND OF THE TESTICLES TECHNIQUE: Complete ultrasound examination of the testicles, epididymis, and other scrotal structures was performed. Color and spectral Doppler ultrasound were also utilized to evaluate blood flow to the testicles. COMPARISON:  None Available. FINDINGS: Right testicle Measurements: 4.0 x 3.4 x 3.5 cm. Diffusely increased color flow vascularity. Normal parenchymal echogenicity and echotexture. No mass or microlithiasis visualized. Left testicle Measurements: 3.1 x 2.2 x 2.4 cm. Normal color flow vascularity. Normal parenchymal echogenicity and echotexture. No mass or microlithiasis visualized. Right epididymis: Mildly thickened and heterogeneous. Diffusely increased vascularity. Left epididymis:  Normal in size and appearance. Hydrocele: A complex, moderate right hydrocele is present with echogenic debris and numerous septa Varicocele:  None visualized. Other: There is mild relative thickening of the right scrotal wall. Pulsed Doppler interrogation of both testes demonstrates normal low resistance arterial and venous waveforms bilaterally. IMPRESSION: 1. Findings most consistent with right epididymo-orchitis. 2. Complex, moderate right hydrocele with echogenic debris and numerous septa. 3. Mild relative thickening of the  right scrotal wall, likely reactive. Electronically Signed   By: Helyn Numbers M.D.   On: 10/17/2022 02:39   CT PELVIS W CONTRAST  Result Date: 10/17/2022 CLINICAL DATA:  Scrotal swelling or edema EXAM: CT PELVIS WITH CONTRAST TECHNIQUE: Multidetector CT imaging of the pelvis was performed using the standard protocol following the bolus administration of intravenous contrast. RADIATION DOSE REDUCTION: This exam was performed according to the departmental dose-optimization program which includes automated exposure control, adjustment of the mA and/or kV according to patient size and/or use of iterative reconstruction technique. CONTRAST:  OMNIPAQUE IOHEXOL 300 MG/ML  SOLN COMPARISON:  05/02/2017 FINDINGS: Urinary Tract: Punctate nonobstructing stone in the lower pole of the right kidney. No hydronephrosis. Urinary bladder grossly unremarkable. Bowel: Moderate stool burden throughout the visualized large bowel. Small bowel decompressed within the pelvis. Vascular/Lymphatic: Scattered calcifications. No aneurysm, dissection or significant stenosis. Reproductive: Prostate unremarkable. Bilateral hydroceles noted. Right testicle visualized. Left testicle not visualized. Other:  No free  fluid or free air. Musculoskeletal: No acute bony abnormality. Postoperative changes in the lower lumbar spine. IMPRESSION: Bilateral scrotal hydroceles noted. If there is clinical concern for testicular abnormality, ultrasound would be a more sensitive test. Moderate stool burden throughout the colon. Punctate right lower pole nephrolithiasis. Electronically Signed   By: Charlett Nose M.D.   On: 10/17/2022 01:14   DG Foot Complete Right  Result Date: 10/16/2022 CLINICAL DATA:  Right lateral foot wound EXAM: RIGHT FOOT COMPLETE - 3+ VIEW COMPARISON:  05/08/2007 FINDINGS: Interval transmetatarsal amputation of the right fifth digit with remote erosion or remote partial resection of the base of the residual right fifth  metatarsal. No erosions or abnormal periosteal reaction. No acute fracture or dislocation. 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 Soft tissues are unremarkable. IMPRESSION: 1. 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 No radiographic evidence of osteomyelitis. Electronically Signed   By: Helyn Numbers M.D.   On: 10/16/2022 23:25    I have personally spent 85 minutes involved in face-to-face and non-face-to-face activities for this patient on the day of the visit. Professional time spent includes the following activities: Preparing to see the patient (review of tests), Obtaining and/or reviewing separately obtained history (admission/discharge record), Performing a medically appropriate examination and/or evaluation , Ordering medications/tests/procedures, referring and communicating with other health care professionals, Documenting clinical information in the EMR, Independently interpreting results (not separately reported), Communicating results to the patient/family/caregiver, Counseling and educating the patient/family/caregiver and Care coordination (not separately reported).  Electronically signed by:   Plan d/w requesting provider as well as ID pharm D  Note: This document was prepared using dragon voice recognition software and may include unintentional dictation errors.   Odette Fraction, MD Infectious Disease Physician [facility] Pager: [phone]

## 2022-10-18 NOTE — TOC Progression Note (Signed)
Transition of Care [facility]) - Progression Note    Patient Details  Name: [name] MRN: [id number] Date of Birth: 1971-03-20  Transition of Care [facility]) CM/SW Contact  Beckie Busing, RN Phone [phone]  10/18/2022, 12:55 PM  Clinical Narrative:    [facility] acknowledges  consult for medication assist. CM will speak with patient. Please note that patient currently has medicaid which offers the lowest cost of medications. TOC will not have any further resources for medication assistance.        Expected Discharge Plan and Services                                               Social Determinants of Health (SDOH) Interventions SDOH Screenings   Food Insecurity: Food Insecurity Present (10/17/2022)  Housing: High Risk (10/17/2022)  Transportation Needs: Unmet Transportation Needs (10/17/2022)  Utilities: Not At Risk (10/17/2022)  Depression (PHQ2-9): Low Risk  (07/20/2022)  Recent Concern: Depression (PHQ2-9) - High Risk (06/30/2022)  Physical Activity: Insufficiently Active (03/01/2021)  Tobacco Use: High Risk (10/16/2022)    Readmission Risk Interventions     No data to display

## 2022-10-18 NOTE — Progress Notes (Signed)
PROGRESS NOTE    [name]  ZOX:[id number] DOB: Mar 23, 1971 DOA: 10/16/2022 PCP: Grayce Sessions, NP   Brief Narrative:  [name] is a 52 y.o. adult male to male transgender with medical history significant for poorly controlled insulin-dependent type 2 diabetes, HIV on Biktarvy, being admitted with orchitis/epididymitis -symptom onset 1 week prior to admission. Urgent care evaluated and treated with levaquin/rocephin - minimal improvement. Hospitalist called for admission in the setting of failed outpatient therapy.  Assessment & Plan:   Principal Problem:   Orchitis Active Problems:   Human immunodeficiency virus (HIV) disease (HCC)   Male-to-male transgender person   Smoker   Poorly controlled diabetes mellitus (HCC)   DM type 2 with diabetic peripheral neuropathy (HCC)   SEPSIS secondary to acute epididymitis/orchitis with failure of outpatient therapy Rule out concurrent UTI - Tachypnea, tachycardia, leukocytosis with noted source - DC zosyn - was previously on levaquin/ceftriaxone outpatient. - ID consulted recommend transition to meropenem given unknown organism and minimal to no improvement over the past 24h - Preliminary urine culture positive for ecoli - unclear if related to epididymitis  - Pain poorly controlled - added IV dilaudid for breakthrough pain on top of current oxycodone  HIV, poorly controlled - Follows with ID (Dr [name]) - Continue biktarvy - HIV 150 in January; DC4 count 1350 at the same appointment  Uncontrolled DM - A1C 8.7 - Continue Sliding scale for coverage/hypoglycemic protocol  DVT prophylaxis: Lovenox Code Status: Full Family Communication: None present  Status is: Inpt  Dispo: The patient is from: Home              Anticipated d/c is to: Home              Anticipated d/c date is: 24-48h              Patient currently NOT medically stable for discharge  Consultants:  None  Procedures:  None  Antimicrobials:   Zosyn discontinued Meropenem as of 10/18/22  Subjective: No acute issues/events overnight, pain poorly controlled - add low dose dilaudid  Objective: Vitals:   10/17/22 1409 10/17/22 1753 10/17/22 2137 10/18/22 0601  BP: 106/70 114/84 114/82 118/72  Pulse: 75 81 77 81  Resp: 16 16 14 14   Temp: 98 F (36.7 C) 98 F (36.7 C) 98.3 F (36.8 C) 98.7 F (37.1 C)  TempSrc: Oral Oral Oral Oral  SpO2: 97% 97% 96% 97%  Weight:      Height:        Intake/Output Summary (Last 24 hours) at 10/18/2022 0721 Last data filed at 10/18/2022 0601 Gross per 24 hour  Intake 576.36 ml  Output 1650 ml  Net -1073.64 ml   Filed Weights   10/16/22 2015  Weight: 93.4 kg    Examination:  General:  Pleasantly resting in bed, No acute distress. HEENT:  Normocephalic atraumatic.  Sclerae nonicteric, noninjected.  Extraocular movements intact bilaterally. Neck:  Without mass or deformity.  Trachea is midline. Lungs:  Clear to auscultate bilaterally without rhonchi, wheeze, or rales. Heart:  Regular rate and rhythm.  Without murmurs, rubs, or gallops. Abdomen:  Soft, nontender, nondistended.  Without guarding or rebound. Extremities: Without cyanosis, clubbing, edema, or obvious deformity.  Data Reviewed: I have personally reviewed following labs and imaging studies  CBC: Recent Labs  Lab 10/16/22 2341 10/17/22 0645  WBC 16.2* 13.1*  NEUTROABS 12.3*  --   HGB 12.4* 11.6*  HCT 36.7* 34.4*  MCV 92.0 91.2  PLT  310 306    Basic Metabolic Panel: Recent Labs  Lab 10/16/22 2341 10/17/22 0645 10/18/22 0624  NA 136 132* 132*  K 4.0 3.4* 3.9  CL 100 98 96*  CO2 27 25 26   GLUCOSE 179* 193* 173*  BUN 20 17 15   CREATININE 0.67 0.66 0.73  CALCIUM 8.9 8.6* 8.5*    GFR: Estimated Creatinine Clearance (by C-G formula based on SCr of 0.73 mg/dL) Male: 191.4 mL/min Male: 126 mL/min Liver Function Tests: Recent Labs  Lab 10/16/22 2341  AST 12*  ALT 12  ALKPHOS 93  BILITOT 0.6  PROT  8.3*  ALBUMIN 3.2*    Sepsis Labs: Recent Labs  Lab 10/17/22 0352 10/17/22 0645  LATICACIDVEN 1.0 0.8     Recent Results (from the past 240 hour(s))  Urine Culture     Status: Abnormal   Collection Time: 10/14/22 12:44 PM   Specimen: Urine, Clean Catch  Result Value Ref Range Status   Specimen Description URINE, CLEAN CATCH  Final   Special Requests   Final    NONE Performed at [facility], [address], [place], [place] [address]    Culture (A)  Final    >=100,000 COLONIES/mL ESCHERICHIA COLI Confirmed Extended Spectrum Beta-Lactamase Producer (ESBL).  In bloodstream infections from ESBL organisms, carbapenems are preferred over piperacillin/tazobactam. They are shown to have a lower risk of mortality.    Report Status 10/16/2022 FINAL  Final   Organism ID, Bacteria ESCHERICHIA COLI (A)  Final      Susceptibility   Escherichia coli - MIC*    AMPICILLIN >=32 RESISTANT Resistant     CEFAZOLIN >=64 RESISTANT Resistant     CEFEPIME 4 INTERMEDIATE Intermediate     CEFTRIAXONE >=64 RESISTANT Resistant     CIPROFLOXACIN 1 RESISTANT Resistant     GENTAMICIN <=1 SENSITIVE Sensitive     IMIPENEM <=0.25 SENSITIVE Sensitive     NITROFURANTOIN <=16 SENSITIVE Sensitive     TRIMETH/SULFA >=320 RESISTANT Resistant     AMPICILLIN/SULBACTAM 4 SENSITIVE Sensitive     PIP/TAZO <=4 SENSITIVE Sensitive     * >=100,000 COLONIES/mL ESCHERICHIA COLI  Blood culture (routine x 2)     Status: None (Preliminary result)   Collection Time: 10/17/22  3:52 AM   Specimen: BLOOD  Result Value Ref Range Status   Specimen Description   Final    BLOOD BLOOD RIGHT HAND Performed at [facility], [address] [place], [place] [address]    Special Requests   Final    BOTTLES DRAWN AEROBIC AND ANAEROBIC Blood Culture results may not be optimal due to an inadequate volume of blood received in culture bottles Performed at [facility], [address],  [place], [place] [address]    Culture   Final    NO GROWTH < 12 HOURS Performed at [facility], [address] [place], [place] [address]    Report Status PENDING  Incomplete         Radiology Studies: US SCROTUM W/DOPPLER  Result Date: 10/17/2022 CLINICAL DATA:  Right testicular pain EXAM: SCROTAL ULTRASOUND DOPPLER ULTRASOUND OF THE TESTICLES TECHNIQUE: Complete ultrasound examination of the testicles, epididymis, and other scrotal structures was performed. Color and spectral Doppler ultrasound were also utilized to evaluate blood flow to the testicles. COMPARISON:  None Available. FINDINGS: Right testicle Measurements: 4.0 x 3.4 x 3.5 cm. Diffusely increased color flow vascularity. Normal parenchymal echogenicity and echotexture. No mass or microlithiasis visualized. Left testicle  Measurements: 3.1 x 2.2 x 2.4 cm. Normal color flow vascularity. Normal parenchymal echogenicity and echotexture. No mass or microlithiasis visualized. Right epididymis: Mildly thickened and heterogeneous. Diffusely increased vascularity. Left epididymis:  Normal in size and appearance. Hydrocele: A complex, moderate right hydrocele is present with echogenic debris and numerous septa Varicocele:  None visualized. Other: There is mild relative thickening of the right scrotal wall. Pulsed Doppler interrogation of both testes demonstrates normal low resistance arterial and venous waveforms bilaterally. IMPRESSION: 1. Findings most consistent with right epididymo-orchitis. 2. Complex, moderate right hydrocele with echogenic debris and numerous septa. 3. Mild relative thickening of the right scrotal wall, likely reactive. Electronically Signed   By: Helyn Numbers M.D.   On: 10/17/2022 02:39   CT PELVIS W CONTRAST  Result Date: 10/17/2022 CLINICAL DATA:  Scrotal swelling or edema EXAM: CT PELVIS WITH CONTRAST TECHNIQUE: Multidetector CT imaging of the pelvis was performed using the standard protocol following the bolus  administration of intravenous contrast. RADIATION DOSE REDUCTION: This exam was performed according to the departmental dose-optimization program which includes automated exposure control, adjustment of the mA and/or kV according to patient size and/or use of iterative reconstruction technique. CONTRAST:  OMNIPAQUE IOHEXOL 300 MG/ML  SOLN COMPARISON:  05/02/2017 FINDINGS: Urinary Tract: Punctate nonobstructing stone in the lower pole of the right kidney. No hydronephrosis. Urinary bladder grossly unremarkable. Bowel: Moderate stool burden throughout the visualized large bowel. Small bowel decompressed within the pelvis. Vascular/Lymphatic: Scattered calcifications. No aneurysm, dissection or significant stenosis. Reproductive: Prostate unremarkable. Bilateral hydroceles noted. Right testicle visualized. Left testicle not visualized. Other:  No free fluid or free air. Musculoskeletal: No acute bony abnormality. Postoperative changes in the lower lumbar spine. IMPRESSION: Bilateral scrotal hydroceles noted. If there is clinical concern for testicular abnormality, ultrasound would be a more sensitive test. Moderate stool burden throughout the colon. Punctate right lower pole nephrolithiasis. Electronically Signed   By: Charlett Nose M.D.   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 No erosions or abnormal periosteal reaction. No acute fracture or dislocation. 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 Soft tissues are unremarkable. IMPRESSION: 1. 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 No radiographic evidence of osteomyelitis. Electronically Signed   By: Helyn Numbers M.D.   On:  10/16/2022 23:25        Scheduled Meds:  bictegravir-emtricitabine-tenofovir AF  1 tablet Oral Daily   docusate sodium  50 mg Oral Once   enoxaparin (LOVENOX) injection  40 mg Subcutaneous Q24H   escitalopram  20 mg Oral QHS   insulin aspart  0-5 Units Subcutaneous QHS   insulin aspart  0-6 Units Subcutaneous TID WC   rosuvastatin  20 mg Oral Daily   spironolactone  100 mg Oral Daily   Continuous Infusions:  piperacillin-tazobactam (ZOSYN)  IV 3.375 g (10/18/22 0559)     LOS: 0 days   Time spent:  Azucena Fallen, DO [facility]  If 7PM-7AM, please contact night-coverage [email or web address]  10/18/2022, 7:21 AM

## 2022-10-18 NOTE — Progress Notes (Addendum)
Urology Consult  Referring physician: Dr. Birdie Hopes  Reason for referral: Epididymoorchitis  Chief Complaint: Right testicular pain  History of Present Illness: [name] is a male to male transgender patient presenting to [facility] with complaint of right testicular pain, dysuria, right low back pain, and hematuria.  PMH significant for HIV on Biktarvy, T2DM, HTN, TLIF fixation, and recent pneumonia.  On my assessment she was alert, oriented, and in no distress.  Patient proved to be a good historian and reports long-term compliance with PrEP therapy.  Despite that, she has struggled with consecutive infections, which may be more due to diabetes than suppressed immunity.  [facility] was consulted to speak to the epididymoorchitis with resistant E. coli.  Past Medical History:  Diagnosis Date   Anxiety    Asthma    Depression    Diabetes mellitus    Gait abnormality    GERD (gastroesophageal reflux disease)    HIV disease (HCC)    Hypertension    Loss of balance 09/14/2020   MRSA carrier    neck   Perirectal abscess 03/30/2017   Pneumonia    Polyuria 04/27/2015   Poorly controlled diabetes mellitus (HCC) 04/27/2015   Testicular mass 06/20/2016   Transgender 04/27/2015   Past Surgical History:  Procedure Laterality Date   APPENDECTOMY     TRANSFORAMINAL LUMBAR INTERBODY FUSION (TLIF) WITH PEDICLE SCREW FIXATION 3 LEVEL N/A 01/09/2019   Procedure: TRANSFORAMINAL LUMBAR INTERBODY FUSION (TLIF) WITH PEDICLE SCREW FIXATION 3 LEVEL, POSTERIOR SPINAL FUSION;  Surgeon: Venetia Night, MD;  Location: [facility] ORS;  Service: Neurosurgery;  Laterality: N/A;    Allergies:  Allergies  Allergen Reactions   Propoxyphene N-Acetaminophen Other (See Comments)    Stomach cramps    Family History  Problem Relation Age of Onset   Other Mother        unsure of history   Heart failure Father    Diabetes Father    Social History:  reports that she has been smoking  cigarettes. She has a 2.70 pack-year smoking history. She has never used smokeless tobacco. She reports that she does not currently use drugs after having used the following drugs: Marijuana. She reports that she does not drink alcohol.  ROS: All systems are reviewed and negative except as noted. Right testicular pain Right flank pain Pubic tenderness  Physical Exam:  Vital signs in last 24 hours: Temp:  [98 F (36.7 C)-98.7 F (37.1 C)] 98.3 F (36.8 C) (05/07 1401) Pulse Rate:  [73-81] 73 (05/07 1401) Resp:  [14-16] 16 (05/07 1401) BP: (113-118)/(72-84) 113/74 (05/07 1401) SpO2:  [96 %-97 %] 96 % (05/07 1401)  Cardiovascular: Skin warm; not flushed Respiratory: Breaths quiet; no shortness of breath Abdomen: No masses, soft, no guarding or rebound Neurological: Normal sensation to touch Musculoskeletal: Normal motor function arms and legs Skin: No rashes Genitourinary: bilateral hydroceles with reactive right. Right epididimoorchitis  Laboratory Data:  Results for orders placed or performed during the hospital encounter of 10/16/22 (from the past 72 hour(s))  CBC with Differential     Status: Abnormal   Collection Time: 10/16/22 11:41 PM  Result Value Ref Range   WBC 16.2 (H) 4.0 - 10.5 K/uL   RBC 3.99 (L) 4.22 - 5.81 MIL/uL   Hemoglobin 12.4 (L) 13.0 - 17.0 g/dL   HCT 42.5 (L) 95.6 - 38.7 %   MCV 92.0 80.0 - 100.0 fL   MCH 31.1 26.0 - 34.0 pg   MCHC 33.8 30.0 - 36.0 g/dL  RDW 14.4 11.5 - 15.5 %   Platelets 310 150 - 400 K/uL   nRBC 0.0 0.0 - 0.2 %   Neutrophils Relative % 77 %   Neutro Abs 12.3 (H) 1.7 - 7.7 K/uL   Lymphocytes Relative 17 %   Lymphs Abs 2.8 0.7 - 4.0 K/uL   Monocytes Relative 5 %   Monocytes Absolute 0.9 0.1 - 1.0 K/uL   Eosinophils Relative 1 %   Eosinophils Absolute 0.1 0.0 - 0.5 K/uL   Basophils Relative 0 %   Basophils Absolute 0.0 0.0 - 0.1 K/uL   Immature Granulocytes 0 %   Abs Immature Granulocytes 0.07 0.00 - 0.07 K/uL    Comment:  Performed at [facility], [address] [place], [place] [address]  Comprehensive metabolic panel     Status: Abnormal   Collection Time: 10/16/22 11:41 PM  Result Value Ref Range   Sodium 136 135 - 145 mmol/L   Potassium 4.0 3.5 - 5.1 mmol/L   Chloride 100 98 - 111 mmol/L   CO2 27 22 - 32 mmol/L   Glucose, Bld 179 (H) 70 - 99 mg/dL    Comment: Glucose reference range applies only to samples taken after fasting for at least 8 hours.   BUN 20 6 - 20 mg/dL   Creatinine, Ser 6.04 0.61 - 1.24 mg/dL   Calcium 8.9 8.9 - 54.0 mg/dL   Total Protein 8.3 (H) 6.5 - 8.1 g/dL   Albumin 3.2 (L) 3.5 - 5.0 g/dL   AST 12 (L) 15 - 41 U/L   ALT 12 0 - 44 U/L   Alkaline Phosphatase 93 38 - 126 U/L   Total Bilirubin 0.6 0.3 - 1.2 mg/dL   GFR, Estimated >98 >11 mL/min    Comment: (NOTE) Calculated using the CKD-EPI Creatinine Equation (2021)    Anion gap 9 5 - 15    Comment: Performed at [facility], [address], [place], [place] [address]  Urinalysis, Routine w reflex microscopic -Urine, Clean Catch     Status: Abnormal   Collection Time: 10/16/22 11:54 PM  Result Value Ref Range   Color, Urine YELLOW YELLOW   APPearance CLEAR CLEAR   Specific Gravity, Urine 1.030 1.005 - 1.030   pH 5.0 5.0 - 8.0   Glucose, UA 50 (A) NEGATIVE mg/dL   Hgb urine dipstick NEGATIVE NEGATIVE   Bilirubin Urine NEGATIVE NEGATIVE   Ketones, ur NEGATIVE NEGATIVE mg/dL   Protein, ur 295 (A) NEGATIVE mg/dL   Nitrite NEGATIVE NEGATIVE   Leukocytes,Ua SMALL (A) NEGATIVE   RBC / HPF 0-5 0 - 5 RBC/hpf   WBC, UA 21-50 0 - 5 WBC/hpf   Bacteria, UA NONE SEEN NONE SEEN   Squamous Epithelial / HPF 0-5 0 - 5 /HPF   Mucus PRESENT     Comment: Performed at [facility], [address] [place], [place] [address]  Blood culture (routine x 2)     Status: None (Preliminary result)   Collection Time: 10/17/22  3:52 AM   Specimen: BLOOD  Result Value Ref Range    Specimen Description      BLOOD BLOOD RIGHT HAND Performed at [facility], [address] [place], [place] [address]    Special Requests      BOTTLES DRAWN AEROBIC AND ANAEROBIC Blood Culture results may not be optimal due to an inadequate volume of blood received in culture bottles Performed at [facility], [address] [place], [place] [address]    Culture      NO GROWTH 1 DAY Performed at [facility], [address] [place], [place] [address]    Report Status PENDING   Lactic acid, plasma     Status: None   Collection Time: 10/17/22  3:52 AM  Result Value Ref Range   Lactic Acid, Venous 1.0 0.5 - 1.9 mmol/L    Comment: Performed at [facility], [address] [place], [place] [address]  Lactic acid, plasma     Status: None   Collection Time: 10/17/22  6:45 AM  Result Value Ref Range   Lactic Acid, Venous 0.8 0.5 - 1.9 mmol/L    Comment: Performed at [facility], [address] [place], [place] [address]  CBC     Status: Abnormal   Collection Time: 10/17/22  6:45 AM  Result Value Ref Range   WBC 13.1 (H) 4.0 - 10.5 K/uL   RBC 3.77 (L) 4.22 - 5.81 MIL/uL   Hemoglobin 11.6 (L) 13.0 - 17.0 g/dL   HCT 28.4 (L) 13.2 - 44.0 %   MCV 91.2 80.0 - 100.0 fL   MCH 30.8 26.0 - 34.0 pg   MCHC 33.7 30.0 - 36.0 g/dL   RDW 10.2 72.5 - 36.6 %   Platelets 306 150 - 400 K/uL   nRBC 0.0 0.0 - 0.2 %    Comment: Performed at [facility], [address], [place], [place] [address]  Basic metabolic panel     Status: Abnormal   Collection Time: 10/17/22  6:45 AM  Result Value Ref Range   Sodium 132 (L) 135 - 145 mmol/L   Potassium 3.4 (L) 3.5 - 5.1 mmol/L   Chloride 98 98 - 111 mmol/L   CO2 25 22 - 32 mmol/L   Glucose, Bld 193 (H) 70 - 99 mg/dL    Comment: Glucose reference range applies only to samples taken after fasting for at least 8 hours.   BUN 17 6 - 20 mg/dL   Creatinine,  Ser 7.42 0.61 - 1.24 mg/dL   Calcium 8.6 (L) 8.9 - 10.3 mg/dL   GFR, Estimated >59 >56 mL/min    Comment: (NOTE) Calculated using the CKD-EPI Creatinine Equation (2021)    Anion gap 9 5 - 15    Comment: Performed at [facility], [address] [place], [place] [address]  Blood culture (routine x 2)     Status: None (Preliminary result)   Collection Time: 10/17/22  8:13 AM   Specimen: BLOOD  Result Value Ref Range   Specimen Description      BLOOD BLOOD LEFT HAND Performed at [facility], [address], [place], [place] [address]    Special Requests      BOTTLES DRAWN AEROBIC AND ANAEROBIC Blood Culture results may not be optimal due to an excessive volume of blood received in culture bottles Performed at [facility], [address], [place], [place] [address]    Culture      NO GROWTH < 24 HOURS Performed at [facility], [address], [place], [place] [address]    Report Status PENDING   Glucose, capillary     Status: Abnormal   Collection Time: 10/17/22  9:33 AM  Result Value Ref Range   Glucose-Capillary 207 (H) 70 - 99 mg/dL    Comment: Glucose reference range applies only to samples taken after fasting  for at least 8 hours.  Glucose, capillary     Status: Abnormal   Collection Time: 10/17/22  2:07 PM  Result Value Ref Range   Glucose-Capillary 142 (H) 70 - 99 mg/dL    Comment: Glucose reference range applies only to samples taken after fasting for at least 8 hours.  Glucose, capillary     Status: Abnormal   Collection Time: 10/17/22  4:35 PM  Result Value Ref Range   Glucose-Capillary 183 (H) 70 - 99 mg/dL    Comment: Glucose reference range applies only to samples taken after fasting for at least 8 hours.  Glucose, capillary     Status: Abnormal   Collection Time: 10/17/22  9:39 PM  Result Value Ref Range   Glucose-Capillary 165 (H) 70 - 99 mg/dL    Comment: Glucose reference range applies only to  samples taken after fasting for at least 8 hours.  CBC     Status: Abnormal   Collection Time: 10/18/22  6:24 AM  Result Value Ref Range   WBC 9.7 4.0 - 10.5 K/uL   RBC 4.15 (L) 4.22 - 5.81 MIL/uL   Hemoglobin 12.8 (L) 13.0 - 17.0 g/dL   HCT 16.1 (L) 09.6 - 04.5 %   MCV 91.8 80.0 - 100.0 fL   MCH 30.8 26.0 - 34.0 pg   MCHC 33.6 30.0 - 36.0 g/dL   RDW 40.9 81.1 - 91.4 %   Platelets 355 150 - 400 K/uL   nRBC 0.0 0.0 - 0.2 %    Comment: Performed at [facility], [address] [place], [place] [address]  Basic metabolic panel     Status: Abnormal   Collection Time: 10/18/22  6:24 AM  Result Value Ref Range   Sodium 132 (L) 135 - 145 mmol/L   Potassium 3.9 3.5 - 5.1 mmol/L   Chloride 96 (L) 98 - 111 mmol/L   CO2 26 22 - 32 mmol/L   Glucose, Bld 173 (H) 70 - 99 mg/dL    Comment: Glucose reference range applies only to samples taken after fasting for at least 8 hours.   BUN 15 6 - 20 mg/dL   Creatinine, Ser 6.21 0.61 - 1.24 mg/dL   Calcium 8.5 (L) 8.9 - 10.3 mg/dL   GFR, Estimated >30 >86 mL/min    Comment: (NOTE) Calculated using the CKD-EPI Creatinine Equation (2021)    Anion gap 10 5 - 15    Comment: Performed at [facility], [address] [place], [place] [address]  Glucose, capillary     Status: Abnormal   Collection Time: 10/18/22  8:07 AM  Result Value Ref Range   Glucose-Capillary 147 (H) 70 - 99 mg/dL    Comment: Glucose reference range applies only to samples taken after fasting for at least 8 hours.  Glucose, capillary     Status: Abnormal   Collection Time: 10/18/22 11:53 AM  Result Value Ref Range   Glucose-Capillary 174 (H) 70 - 99 mg/dL    Comment: Glucose reference range applies only to samples taken after fasting for at least 8 hours.   Recent Results (from the past 240 hour(s))  Urine Culture     Status: Abnormal   Collection Time: 10/14/22 12:44 PM   Specimen: Urine, Clean Catch  Result Value Ref Range Status    Specimen Description URINE, CLEAN CATCH  Final   Special Requests   Final    NONE Performed at [facility], [address], [place], [place] [address]    Culture (A)  Final    >=100,000 COLONIES/mL ESCHERICHIA COLI Confirmed Extended Spectrum Beta-Lactamase Producer (ESBL).  In bloodstream infections from ESBL organisms, carbapenems are preferred over piperacillin/tazobactam. They are shown to have a lower risk of mortality.    Report Status 10/16/2022 FINAL  Final   Organism ID, Bacteria ESCHERICHIA COLI (A)  Final      Susceptibility   Escherichia coli - MIC*    AMPICILLIN >=32 RESISTANT Resistant     CEFAZOLIN >=64 RESISTANT Resistant     CEFEPIME 4 INTERMEDIATE Intermediate     CEFTRIAXONE >=64 RESISTANT Resistant     CIPROFLOXACIN 1 RESISTANT Resistant     GENTAMICIN <=1 SENSITIVE Sensitive     IMIPENEM <=0.25 SENSITIVE Sensitive     NITROFURANTOIN <=16 SENSITIVE Sensitive     TRIMETH/SULFA >=320 RESISTANT Resistant     AMPICILLIN/SULBACTAM 4 SENSITIVE Sensitive     PIP/TAZO <=4 SENSITIVE Sensitive     * >=100,000 COLONIES/mL ESCHERICHIA COLI  Blood culture (routine x 2)     Status: None (Preliminary result)   Collection Time: 10/17/22  3:52 AM   Specimen: BLOOD  Result Value Ref Range Status   Specimen Description   Final    BLOOD BLOOD RIGHT HAND Performed at [facility], [address] [place], [place] [address]    Special Requests   Final    BOTTLES DRAWN AEROBIC AND ANAEROBIC Blood Culture results may not be optimal due to an inadequate volume of blood received in culture bottles Performed at [facility], [address], [place], [place] [address]    Culture   Final    NO GROWTH 1 DAY Performed at [facility], [address], [place], [place] [address]    Report Status PENDING  Incomplete  Blood culture (routine x 2)     Status: None (Preliminary result)   Collection Time: 10/17/22  8:13 AM   Specimen: BLOOD   Result Value Ref Range Status   Specimen Description   Final    BLOOD BLOOD LEFT HAND Performed at [facility], [address], [place], [place] [address]    Special Requests   Final    BOTTLES DRAWN AEROBIC AND ANAEROBIC Blood Culture results may not be optimal due to an excessive volume of blood received in culture bottles Performed at [facility], [address], [place], [place] [address]    Culture   Final    NO GROWTH < 24 HOURS Performed at [facility], [address] [place], [place] [address]    Report Status PENDING  Incomplete   Creatinine: Recent Labs    10/16/22 2341 10/17/22 0645 10/18/22 0624  CREATININE 0.67 0.66 0.73    Imaging: On independent review of CT ABD/pelvis there is no hydronephrosis to suggest obstructing urinary stone.  Bilateral hydroceles, right is reactive.  While not mentioned on CT imaging, there is significant stranding transversing the right inguinal canal.   Impression/Assessment:  Urine culture positive for multidrug-resistant E. coli.  Physical exam correlates with imaging, exquisite tenderness going from the epididymis through the pubic symphysis and up right inguinal canal.  There is some swelling and redness. No area of fluctuance, crepitus, or streaking. There is no gas or signs of abscess formation at this time.  Should patient acutely worsen I would have a low threshold to reimage this area with IV contrast.  Plan:  ABX per infectious disease  No indication for surgical intervention at this time Patient has expressed desire for bilateral orchiectomy.  We discussed this could be performed electively following appropriate counseling once patient's infection is cleared. We will follow along with you.  Scherrie Bateman [place] 10/18/2022, 3:11 PM  Pager: [phone]   Attending attestation: Patient seen and examined independently of the nurse practitioner.  Agree with assessment and plan  except as noted:  Patient with severe right-sided epididymoorchitis.  Has a reactive hydrocele as a result.  No evidence of Fournier's gangrene or abscess on CT of the pelvis or ultrasound.  Has some edema surrounding her right sided epididymoorchitis.  Failed outpatient management as she has multidrug-resistant E. coli.  Evaluated by infectious disease and I agree with transitioning to IV meropenem.  Will need this for 14 days.  Recommend NSAIDs.  Good scrotal support.  No surgical intervention necessary.  On examination, phallus is normal.  Left testicle descended without masses.  Right testicle enlarged and tender.  Tender inguinal canal.  No crepitus or fluctuance.  She does express desire for bilateral orchiectomy.  I recommended referral to [facility] transgender clinic for evaluation of this down the road.

## 2022-10-19 DIAGNOSIS — N3001 Acute cystitis with hematuria: Secondary | ICD-10-CM

## 2022-10-19 DIAGNOSIS — Z789 Other specified health status: Secondary | ICD-10-CM | POA: Diagnosis not present

## 2022-10-19 DIAGNOSIS — E1142 Type 2 diabetes mellitus with diabetic polyneuropathy: Secondary | ICD-10-CM | POA: Diagnosis not present

## 2022-10-19 DIAGNOSIS — B20 Human immunodeficiency virus [HIV] disease: Secondary | ICD-10-CM | POA: Diagnosis not present

## 2022-10-19 DIAGNOSIS — N453 Epididymo-orchitis: Secondary | ICD-10-CM | POA: Diagnosis not present

## 2022-10-19 DIAGNOSIS — E1165 Type 2 diabetes mellitus with hyperglycemia: Secondary | ICD-10-CM | POA: Diagnosis not present

## 2022-10-19 LAB — BASIC METABOLIC PANEL
Anion gap: 5 (ref 5–15)
BUN: 15 mg/dL (ref 6–20)
CO2: 29 mmol/L (ref 22–32)
Calcium: 8.8 mg/dL — ABNORMAL LOW (ref 8.9–10.3)
Chloride: 98 mmol/L (ref 98–111)
Creatinine, Ser: 0.73 mg/dL (ref 0.61–1.24)
GFR, Estimated: >60 mL/min (ref >60)
Glucose, Bld: 147 mg/dL — ABNORMAL HIGH (ref 70–99)
Potassium: 4.5 mmol/L (ref 3.5–5.1)
Sodium: 132 mmol/L — ABNORMAL LOW (ref 135–145)

## 2022-10-19 LAB — CBC
HCT: 38.3 % — ABNORMAL LOW (ref 39.0–52.0)
Hemoglobin: 13 g/dL (ref 13.0–17.0)
MCH: 30.9 pg (ref 26.0–34.0)
MCHC: 33.9 g/dL (ref 30.0–36.0)
MCV: 91 fL (ref 80.0–100.0)
Platelets: 365 10*3/uL (ref 150–400)
RBC: 4.21 MIL/uL — ABNORMAL LOW (ref 4.22–5.81)
RDW: 13.6 % (ref 11.5–15.5)
WBC: 10.1 10*3/uL (ref 4.0–10.5)
nRBC: 0 % (ref 0.0–0.2)

## 2022-10-19 LAB — URINE CYTOLOGY ANCILLARY ONLY
Bacterial Vaginitis-Urine: NEGATIVE
Chlamydia: NEGATIVE
Comment: NEGATIVE
Comment: NEGATIVE
Comment: NORMAL
Neisseria Gonorrhea: NEGATIVE
Trichomonas: NEGATIVE

## 2022-10-19 LAB — GLUCOSE, CAPILLARY
Glucose-Capillary: 142 mg/dL — ABNORMAL HIGH (ref 70–99)
Glucose-Capillary: 168 mg/dL — ABNORMAL HIGH (ref 70–99)
Glucose-Capillary: 200 mg/dL — ABNORMAL HIGH (ref 70–99)
Glucose-Capillary: 169 mg/dL — ABNORMAL HIGH (ref 70–99)

## 2022-10-19 LAB — CULTURE, BLOOD (ROUTINE X 2): Culture: NO GROWTH

## 2022-10-19 MED ORDER — BICTEGRAVIR-EMTRICITAB-TENOFOV 50-200-25 MG PO TABS
1.0000 | ORAL_TABLET | Freq: Every day | ORAL | Status: DC
  Administered 2022-10-20: 1 via ORAL
  Filled 2022-10-19: qty 1

## 2022-10-19 MED ORDER — ADULT MULTIVITAMIN W/MINERALS CH
1.0000 | ORAL_TABLET | Freq: Every day | ORAL | Status: DC
  Administered 2022-10-19 – 2022-10-20 (×2): 1 via ORAL
  Filled 2022-10-19 (×2): qty 1

## 2022-10-19 MED ORDER — SODIUM CHLORIDE 0.9% FLUSH: 10.0000 mL | INTRAVENOUS | Status: DC | PRN

## 2022-10-19 MED ORDER — ALBUTEROL SULFATE (2.5 MG/3ML) 0.083% IN NEBU: 3.0000 mL | INHALATION_SOLUTION | Freq: Four times a day (QID) | RESPIRATORY_TRACT | Status: DC | PRN

## 2022-10-19 MED ORDER — ENSURE MAX PROTEIN PO LIQD
11.0000 [oz_av] | Freq: Two times a day (BID) | ORAL | Status: DC
  Administered 2022-10-19 – 2022-10-20 (×3): 11 [oz_av] via ORAL
  Filled 2022-10-19 (×4): qty 330

## 2022-10-19 MED ORDER — SODIUM CHLORIDE 0.9% FLUSH
10.0000 mL | Freq: Two times a day (BID) | INTRAVENOUS | Status: DC
  Administered 2022-10-19: 10 mL

## 2022-10-19 NOTE — Progress Notes (Signed)
PROGRESS NOTE    [name]  ZOX:[id number] DOB: [birth year] DOA: 10/16/2022 PCP: Grayce Sessions, NP   Brief Narrative:  [name] is a 52 y.o. adult male to male transgender with medical history significant for poorly controlled insulin-dependent type 2 diabetes, HIV on Biktarvy, being admitted with orchitis/epididymitis -symptom onset 1 week prior to admission. Urgent care evaluated and treated with levaquin/rocephin - minimal improvement. Hospitalist called for admission in the setting of failed outpatient therapy.  5/8: Vital stable.  Cultures with ESBL E. coli.  Antibiotics were switched to meropenem.  ID is on board and recommending 2 weeks of meropenem.  Ordered midline Urology was also consulted as patient was requesting bilateral orchiectomy but they were recommending outpatient follow-up with an elective procedure after treating the infection.  Assessment & Plan:   Principal Problem:   Epididymo-orchitis Active Problems:   Human immunodeficiency virus (HIV) disease (HCC)   Male-to-male transgender person   Smoker   Poorly controlled diabetes mellitus (HCC)   DM type 2 with diabetic peripheral neuropathy (HCC)   Inguinal swelling   Acute cystitis with hematuria   SEPSIS secondary to acute epididymitis/orchitis with failure of outpatient therapy Rule out concurrent UTI - Tachypnea, tachycardia, leukocytosis with noted source -Cultures with ESBL E. coli, initially received Zosyn followed by meropenem -ID is recommending 2 weeks of meropenem -Midline ordered -Continue with pain management  HIV, poorly controlled - Follows with ID (Dr [name]) - Continue biktarvy - HIV 150 in January; DC4 count 1350 at the same appointment  Uncontrolled DM - A1C 8.7 - Continue Sliding scale for coverage/hypoglycemic protocol  DVT prophylaxis: Lovenox Code Status: Full Family Communication: None present  Status is: Inpt  Dispo: The patient is from: Home               Anticipated d/c is to: Home              Anticipated d/c date is: 24-48h              Patient currently NOT medically stable for discharge  Consultants:  Infectious disease Urology  Procedures:  None  Antimicrobials:  Zosyn discontinued Meropenem as of 10/18/22  Subjective: Patient was seen and examined today.  Pain seems improving.  Had some penile discharge overnight.  Objective: Vitals:   10/18/22 2044 10/19/22 0516 10/19/22 1449 10/19/22 1458  BP: 135/89 124/83 110/79 113/64  Pulse: 88 74 83 (!) 104  Resp: 18 18 18 18   Temp: 98.4 F (36.9 C) 97.8 F (36.6 C) 97.9 F (36.6 C) 98.8 F (37.1 C)  TempSrc: Oral Oral Oral Oral  SpO2: 97% 98% 98% 95%  Weight:      Height:        Intake/Output Summary (Last 24 hours) at 10/19/2022 1601 Last data filed at 10/19/2022 1458 Gross per 24 hour  Intake 240 ml  Output 900 ml  Net -660 ml    Filed Weights   10/16/22 2015  Weight: 93.4 kg    Examination: General.  Well-developed male ,in no acute distress. Pulmonary.  Lungs clear bilaterally, normal respiratory effort. CV.  Regular rate and rhythm, no JVD, rub or murmur. Abdomen.  Soft, nontender, nondistended, BS positive. CNS.  Alert and oriented .  No focal neurologic deficit. Extremities.  No edema, no cyanosis, pulses intact and symmetrical. Psychiatry.  Judgment and insight appears normal.   Data Reviewed: I have personally reviewed following labs and imaging studies  CBC: Recent Labs  Lab 10/16/22  2341 10/17/22 0645 10/18/22 0624 10/19/22 0614  WBC 16.2* 13.1* 9.7 10.1  NEUTROABS 12.3*  --   --   --   HGB 12.4* 11.6* 12.8* 13.0  HCT 36.7* 34.4* 38.1* 38.3*  MCV 92.0 91.2 91.8 91.0  PLT 310 306 355 365    Basic Metabolic Panel: Recent Labs  Lab 10/16/22 2341 10/17/22 0645 10/18/22 0624 10/19/22 0614  NA 136 132* 132* 132*  K 4.0 3.4* 3.9 4.5  CL 100 98 96* 98  CO2 27 25 26 29   GLUCOSE 179* 193* 173* 147*  BUN 20 17 15 15   CREATININE 0.67  0.66 0.73 0.73  CALCIUM 8.9 8.6* 8.5* 8.8*    GFR: Estimated Creatinine Clearance (by C-G formula based on SCr of 0.73 mg/dL) Male: 147.8 mL/min Male: 126 mL/min Liver Function Tests: Recent Labs  Lab 10/16/22 2341  AST 12*  ALT 12  ALKPHOS 93  BILITOT 0.6  PROT 8.3*  ALBUMIN 3.2*    Sepsis Labs: Recent Labs  Lab 10/17/22 0352 10/17/22 0645  LATICACIDVEN 1.0 0.8     Recent Results (from the past 240 hour(s))  Urine Culture     Status: Abnormal   Collection Time: 10/14/22 12:44 PM   Specimen: Urine, Clean Catch  Result Value Ref Range Status   Specimen Description URINE, CLEAN CATCH  Final   Special Requests   Final    NONE Performed at [facility], [address], [place], [place] [address]    Culture (A)  Final    >=100,000 COLONIES/mL ESCHERICHIA COLI Confirmed Extended Spectrum Beta-Lactamase Producer (ESBL).  In bloodstream infections from ESBL organisms, carbapenems are preferred over piperacillin/tazobactam. They are shown to have a lower risk of mortality.    Report Status 10/16/2022 FINAL  Final   Organism ID, Bacteria ESCHERICHIA COLI (A)  Final      Susceptibility   Escherichia coli - MIC*    AMPICILLIN >=32 RESISTANT Resistant     CEFAZOLIN >=64 RESISTANT Resistant     CEFEPIME 4 INTERMEDIATE Intermediate     CEFTRIAXONE >=64 RESISTANT Resistant     CIPROFLOXACIN 1 RESISTANT Resistant     GENTAMICIN <=1 SENSITIVE Sensitive     IMIPENEM <=0.25 SENSITIVE Sensitive     NITROFURANTOIN <=16 SENSITIVE Sensitive     TRIMETH/SULFA >=320 RESISTANT Resistant     AMPICILLIN/SULBACTAM 4 SENSITIVE Sensitive     PIP/TAZO <=4 SENSITIVE Sensitive     * >=100,000 COLONIES/mL ESCHERICHIA COLI  Blood culture (routine x 2)     Status: None (Preliminary result)   Collection Time: 10/17/22  3:52 AM   Specimen: BLOOD  Result Value Ref Range Status   Specimen Description   Final    BLOOD BLOOD RIGHT HAND Performed at [facility], [address] [place], [place] [address]    Special Requests   Final    BOTTLES DRAWN AEROBIC AND ANAEROBIC Blood Culture results may not be optimal due to an inadequate volume of blood received in culture bottles Performed at [facility], [address] [place], [place] [address]    Culture   Final    NO GROWTH 2 DAYS Performed at [facility], [address] [place], [place] [address]    Report Status PENDING  Incomplete  Blood culture (routine x 2)     Status: None (Preliminary result)   Collection Time: 10/17/22  8:13 AM   Specimen: BLOOD  Result Value Ref Range Status   Specimen  Description   Final    BLOOD BLOOD LEFT HAND Performed at [facility], [address], [place], [place] [address]    Special Requests   Final    BOTTLES DRAWN AEROBIC AND ANAEROBIC Blood Culture results may not be optimal due to an excessive volume of blood received in culture bottles Performed at [facility], [address], [place], [place] [address]    Culture   Final    NO GROWTH 2 DAYS Performed at [facility], [address], [place], [place] [address]    Report Status PENDING  Incomplete         Radiology Studies: No results found.      Scheduled Meds:  [START ON 10/20/2022] bictegravir-emtricitabine-tenofovir AF  1 tablet Oral Q breakfast   docusate sodium  50 mg Oral Once   enoxaparin (LOVENOX) injection  40 mg Subcutaneous Q24H   escitalopram  20 mg Oral QHS   insulin aspart  0-5 Units Subcutaneous QHS   insulin aspart  0-6 Units Subcutaneous TID WC   Ensure Max Protein  11 oz Oral BID   rosuvastatin  20 mg Oral Daily   spironolactone  100 mg Oral Daily   Continuous Infusions:  meropenem (MERREM) IV 1 g (10/19/22 1341)     LOS: 1 day   Time spent: 45 min  Arnetha Courser, MD [facility]  If 7PM-7AM, please contact night-coverage [email or web address]  10/19/2022, 4:01 PM

## 2022-10-19 NOTE — Progress Notes (Signed)
Initial Nutrition Assessment  DOCUMENTATION CODES:   Not applicable  INTERVENTION:  Ensure Max po BID, each supplement provides 150 kcal and 30 grams of protein.   MVI   Education on adequate nutrition    NUTRITION DIAGNOSIS:   Inadequate oral intake related to decreased appetite as evidenced by per patient/family report, moderate fat depletion, mild muscle depletion.   GOAL:   Patient will meet greater than or equal to 90% of their needs   MONITOR:   PO intake, Skin, Supplement acceptance, I & O's, Labs, Weight trends  REASON FOR ASSESSMENT:   Malnutrition Screening Tool    ASSESSMENT:   52 y.o. adult male to male transgender with medical history significant for poorly controlled insulin-dependent type 2 diabetes, HIV on Biktarvy, being admitted with orchitis/epididymitis.  Labs: Na 132, Glu 147, hga1c 11.4  Meds: colace, insulin, merrem, crestor, aldactone PO: 100%  I/O's: -2.1 L  Wt: patient has lost 100# over the past; admit wt 93.4 kg   Visited patient at bedside who reports good appetite and the food has been good. She reports eating 2.5 meals per day (B- fried egg sandwich, L-PB fold over, D- protein, starch veggie, no snacks)   No N/V/D/C, trouble chewing/swallowing  Used to weight 286# many years ago and is still gradually losing weight.   Patient is agreeable to drinking a protein ONS BID along with MVI.   NUTRITION - FOCUSED PHYSICAL EXAM:  Flowsheet Row Most Recent Value  Orbital Region Moderate depletion  Upper Arm Region Moderate depletion  Thoracic and Lumbar Region No depletion  Buccal Region Moderate depletion  Temple Region Moderate depletion  Clavicle Bone Region Mild depletion  Clavicle and Acromion Bone Region Mild depletion  Scapular Bone Region Unable to assess  Dorsal Hand Mild depletion  Patellar Region No depletion  Anterior Thigh Region No depletion  Posterior Calf Region No depletion  Edema (RD Assessment) None  Hair  Reviewed  Eyes Reviewed  Mouth Reviewed  Skin Reviewed  Nails Reviewed       Diet Order:   Diet Order             Diet Carb Modified Fluid consistency: Thin; Room service appropriate? Yes  Diet effective now                   EDUCATION NEEDS:   Education needs have been addressed  Skin:  Skin Assessment: Skin Integrity Issues: Skin Integrity Issues:: Incisions Incisions: dehisced pinky toe  Last BM:  5/5  Height:   Ht Readings from Last 1 Encounters:  10/16/22 5\' 11"  (1.803 m)    Weight:   Wt Readings from Last 1 Encounters:  10/16/22 93.4 kg    BMI:  Body mass index is 28.73 kg/m.  Estimated Nutritional Needs:   Kcal:  1950-2300 kcal  Protein:  90-110 g  Fluid:  >/= 2L    Leodis Rains, RDN, LDN  Clinical Nutrition

## 2022-10-20 ENCOUNTER — Other Ambulatory Visit: Payer: Self-pay

## 2022-10-20 ENCOUNTER — Other Ambulatory Visit (HOSPITAL_COMMUNITY): Payer: Self-pay

## 2022-10-20 ENCOUNTER — Inpatient Hospital Stay (HOSPITAL_COMMUNITY): Payer: Medicaid Other

## 2022-10-20 DIAGNOSIS — B20 Human immunodeficiency virus [HIV] disease: Secondary | ICD-10-CM | POA: Diagnosis not present

## 2022-10-20 DIAGNOSIS — Z789 Other specified health status: Secondary | ICD-10-CM | POA: Diagnosis not present

## 2022-10-20 DIAGNOSIS — N3001 Acute cystitis with hematuria: Secondary | ICD-10-CM | POA: Diagnosis not present

## 2022-10-20 DIAGNOSIS — N453 Epididymo-orchitis: Secondary | ICD-10-CM

## 2022-10-20 DIAGNOSIS — N433 Hydrocele, unspecified: Secondary | ICD-10-CM | POA: Diagnosis not present

## 2022-10-20 DIAGNOSIS — E1165 Type 2 diabetes mellitus with hyperglycemia: Secondary | ICD-10-CM | POA: Diagnosis not present

## 2022-10-20 DIAGNOSIS — F640 Transsexualism: Secondary | ICD-10-CM

## 2022-10-20 DIAGNOSIS — E1142 Type 2 diabetes mellitus with diabetic polyneuropathy: Secondary | ICD-10-CM | POA: Diagnosis not present

## 2022-10-20 DIAGNOSIS — F17200 Nicotine dependence, unspecified, uncomplicated: Secondary | ICD-10-CM

## 2022-10-20 LAB — BASIC METABOLIC PANEL
Anion gap: 4 — ABNORMAL LOW (ref 5–15)
BUN: 21 mg/dL — ABNORMAL HIGH (ref 6–20)
CO2: 30 mmol/L (ref 22–32)
Calcium: 8.9 mg/dL (ref 8.9–10.3)
Chloride: 98 mmol/L (ref 98–111)
Creatinine, Ser: 0.78 mg/dL (ref 0.61–1.24)
GFR, Estimated: >60 mL/min (ref >60)
Glucose, Bld: 164 mg/dL — ABNORMAL HIGH (ref 70–99)
Potassium: 4.9 mmol/L (ref 3.5–5.1)
Sodium: 132 mmol/L — ABNORMAL LOW (ref 135–145)

## 2022-10-20 LAB — CULTURE, BLOOD (ROUTINE X 2): Culture: NO GROWTH

## 2022-10-20 LAB — CBC
HCT: 39.4 % (ref 39.0–52.0)
Hemoglobin: 13.4 g/dL (ref 13.0–17.0)
MCH: 30.7 pg (ref 26.0–34.0)
MCHC: 34 g/dL (ref 30.0–36.0)
MCV: 90.2 fL (ref 80.0–100.0)
Platelets: 378 10*3/uL (ref 150–400)
RBC: 4.37 MIL/uL (ref 4.22–5.81)
RDW: 13.3 % (ref 11.5–15.5)
WBC: 10 10*3/uL (ref 4.0–10.5)
nRBC: 0 % (ref 0.0–0.2)

## 2022-10-20 LAB — HIV-1 RNA QUANT-NO REFLEX-BLD
HIV 1 RNA Quant: 30 copies/mL
LOG10 HIV-1 RNA: 1.477 log10copy/mL

## 2022-10-20 LAB — GLUCOSE, CAPILLARY
Glucose-Capillary: 179 mg/dL — ABNORMAL HIGH (ref 70–99)
Glucose-Capillary: 227 mg/dL — ABNORMAL HIGH (ref 70–99)
Glucose-Capillary: 167 mg/dL — ABNORMAL HIGH (ref 70–99)

## 2022-10-20 MED ORDER — SODIUM CHLORIDE 0.9 % IV SOLN
1.0000 g | INTRAVENOUS | Status: DC
  Administered 2022-10-20: 1000 mg via INTRAVENOUS
  Filled 2022-10-20: qty 1

## 2022-10-20 MED ORDER — BIKTARVY 50-200-25 MG PO TABS
1.0000 | ORAL_TABLET | Freq: Every day | ORAL | 0 refills | Status: DC
Start: 2022-10-20 — End: 2022-11-02
  Filled 2022-10-20 (×2): qty 30, 30d supply, fill #0

## 2022-10-20 MED ORDER — ADULT MULTIVITAMIN W/MINERALS CH
1.0000 | ORAL_TABLET | Freq: Every day | ORAL | 0 refills | Status: DC
  Filled 2022-10-20: qty 90, 90d supply, fill #0

## 2022-10-20 MED ORDER — OXYCODONE HCL 5 MG PO TABS
5.0000 mg | ORAL_TABLET | ORAL | 0 refills | Status: DC | PRN
  Filled 2022-10-20: qty 30, 5d supply, fill #0

## 2022-10-20 MED ORDER — ERTAPENEM IV (FOR PTA / DISCHARGE USE ONLY)
1.0000 g | INTRAVENOUS | 0 refills | Status: AC
Start: 2022-10-20 — End: 2022-11-01

## 2022-10-20 NOTE — Progress Notes (Addendum)
[facility] Infectious Diseases Follow Up Note  Patient Identification: Patient Name: [name] MRN: [id number] Admit Date: 10/16/2022  8:13 PM Age: 52 y.o.Today's Date: 10/20/2022  Reason for Visit: Follow-up on epididymitis/orchitis, pyelonephritis  Principal Problem:   Epididymo-orchitis Active Problems:   Human immunodeficiency virus (HIV) disease (HCC)   Male-to-male transgender person   Smoker   Poorly controlled diabetes mellitus (HCC)   DM type 2 with diabetic peripheral neuropathy (HCC)   Inguinal swelling   Acute cystitis with hematuria   Antibiotics:  Gentamicin 5/5 Levofloxacin 5/5 Zosyn 5/6, meropenem 5/7-c   Lines/Hardware:  Interval Events: Continues to be afebrile, improving pain and swelling in the right testicle.  By urology Dr. Alvester Morin and recommended conservative management.  Plan for ultrasound scrotum with Doppler today   Assessment 16 Y O M to F transgender male with HIV on biktravy, DM, HTN, TLIF with instrumented fixation, ? 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   # RT Sided epididymal orchitis with complex moderate right hydrocele -Less likely this is related to STDs given no reported sexual history for 3 years.  5/7 Urine GC and trichomonas negative  - Seen by Urology, recommended conservative management with at least 14 days of abtx  - Do not suspect other causes at this time like fungal, mumps, Brucella, MTB currently esp with positive urine cultures  - [place] 5/9 with improved findings   # Possible  pyelonephritis//nonobstructive right kidney stone # HIV - on Biktarvy # Male to male transgender - on estradiol 2mg  po daily  and spironolactone 100mg  po daily OP, Expressed desire to undergo bilateral orchiectomy to urology   Recommendations Continue  meropenem/ertapenem to complete 2 weeks course. EOT 11/01/22  Monitor CBC and CMP Continue Biktarvy, Fu HIV RNA Will arrange with follow up visit with Dr [name] in 1-2 weeks   OPAT  Diagnosis: Epididymo-orchitis, pyelonephritis   Culture Result: ESBL E coli   Allergies  Allergen Reactions   Propoxyphene N-Acetaminophen Other (See Comments)    Stomach cramps    OPAT Orders Discharge antibiotics to be given via PICC line Discharge antibiotics: ertapenem 1 g iv daily  Per pharmacy protocol  Duration: 2 weeks  End Date: 11/01/22  [facility] Per Protocol:  Home health RN for IV administration and teaching; PICC line care and labs.    Labs weekly while on IV antibiotics: X__ CBC with differential __ BMP X__ CMP __ CRP __ ESR __ Vancomycin trough __ CK  X__ Please pull PIC at completion of IV antibiotics __ Please leave PIC in place until doctor has seen patient or been notified  Fax weekly labs to [phone]  Clinic Follow Up Appt: 5/22 with Dr [name]    Rest of the management as per the primary team. Thank you for the consult. Please page with pertinent questions or concerns.  ______________________________________________________________________ Subjective patient seen and examined at the bedside. Pain at the rt scrotum is down to 7 from 10/10. Swelling improving. Some yellowish discharge from the penile urethra. Eager to go home.   Vitals BP 113/81 (BP Location: Left Arm)   Pulse 71   Temp 98 F (36.7 C) (Oral)   Resp 18   Ht 5\' 11"  (1.803 m)   Wt 93.4 kg   SpO2 95%   BMI 28.73 kg/m     Physical Exam Constitutional: Adult male  to male transgender    Comments: Appears comfortable  Cardiovascular:     Rate and Rhythm: Normal rate and regular rhythm.     Heart sounds: s1s2  Pulmonary:     Effort: Pulmonary effort is normal.     Comments:   Abdominal:     Palpations: Abdomen is soft.     Tenderness: Nondistended  Musculoskeletal:         General: No swelling or tenderness in peripheral joints  Skin:    Comments: No rashes  GU ( chaperoned) Swelling and tenderness in the rt groin, pubis and scrotum has significantly improved, still some induration/tenderness, no discharge or open wounds Left scrotum with no swelling and tenderness No fluctuance or crepitus in the scrotal area  Neurological:     General: awake, alert and oriented, following commands  Psychiatric:        Mood and Affect: Mood normal.   Pertinent Microbiology Results for orders placed or performed during the hospital encounter of 10/16/22  Blood culture (routine x 2)     Status: None (Preliminary result)   Collection Time: 10/17/22  3:52 AM   Specimen: BLOOD  Result Value Ref Range Status   Specimen Description   Final    BLOOD BLOOD RIGHT HAND Performed at [facility], [address] [place], [place] [address]    Special Requests   Final    BOTTLES DRAWN AEROBIC AND ANAEROBIC Blood Culture results may not be optimal due to an inadequate volume of blood received in culture bottles Performed at [facility], [address] [place], [place] [address]    Culture   Final    NO GROWTH 3 DAYS Performed at [facility], [address], [place], [place] [address]    Report Status PENDING  Incomplete  Blood culture (routine x 2)     Status: None (Preliminary result)   Collection Time: 10/17/22  8:13 AM   Specimen: BLOOD  Result Value Ref Range Status   Specimen Description   Final    BLOOD BLOOD LEFT HAND Performed at [facility], [address] [place], [place] [address]    Special Requests   Final    BOTTLES DRAWN AEROBIC AND ANAEROBIC Blood Culture results may not be optimal due to an excessive volume of blood received in culture bottles Performed at [facility], [address] [place], [place] [address]    Culture   Final    NO GROWTH 3 DAYS Performed at  [facility], [address], [place], [place] [address]    Report Status PENDING  Incomplete   Pertinent Lab.    Latest Ref Rng & Units 10/20/2022    6:03 AM 10/19/2022    6:14 AM 10/18/2022    6:24 AM  CBC  WBC 4.0 - 10.5 K/uL 10.0  10.1  9.7   Hemoglobin 13.0 - 17.0 g/dL 57.8  46.9  62.9   Hematocrit 39.0 - 52.0 % 39.4  38.3  38.1   Platelets 150 - 400 K/uL 378  365  355       Latest Ref Rng & Units 10/20/2022    6:03 AM 10/19/2022    6:14 AM 10/18/2022    6:24 AM  CMP  Glucose 70 - 99 mg/dL 528  413  244   BUN 6 - 20 mg/dL 21  15  15    Creatinine 0.61 - 1.24 mg/dL 0.10  2.72  5.36  Sodium 135 - 145 mmol/L 132  132  132   Potassium 3.5 - 5.1 mmol/L 4.9  4.5  3.9   Chloride 98 - 111 mmol/L 98  98  96   CO2 22 - 32 mmol/L 30  29  26    Calcium 8.9 - 10.3 mg/dL 8.9  8.8  8.5      Pertinent Imaging today Plain films and CT images have been personally visualized and interpreted; radiology reports have been reviewed. Decision making incorporated into the Impression /   No results found.  I have personally spent 36  minutes involved in face-to-face and non-face-to-face activities for this patient on the day of the visit. Professional time spent includes the following activities: Preparing to see the patient (review of tests), Obtaining and/or reviewing separately obtained history (admission/discharge record), Performing a medically appropriate examination and/or evaluation , Ordering medications/tests/procedures, referring and communicating with other health care professionals, Documenting clinical information in the EMR, Independently interpreting results (not separately reported), Communicating results to the patient/family/caregiver, Counseling and educating the patient/family/caregiver and Care coordination (not separately reported).   Plan d/w requesting provider as well as ID pharm D  Note: This document was prepared using dragon voice recognition software and may include  unintentional dictation errors.   Electronically signed by:   Odette Fraction, MD Infectious Disease Physician [facility] for Infectious Disease Pager: [phone]

## 2022-10-20 NOTE — Progress Notes (Signed)
Patient discharged to home, Laural Roes RN, gave discharge instructions to patient who verbalized understanding. Patient instructed that Home Health will get in touch with them tonight or in the morning about coming tomorrow with antibiotics and to give teaching.

## 2022-10-20 NOTE — Progress Notes (Signed)
PHARMACY CONSULT NOTE FOR:  OUTPATIENT  PARENTERAL ANTIBIOTIC THERAPY (OPAT)  Indication: ESBL E.coli epididymitis, orchitis  Regimen: Ertapenem 1g IV every 24 hours End date: 11/01/22  IV antibiotic discharge orders are pended. To discharging provider:  please sign these orders via discharge navigator,  Select New Orders & click on the button choice - Manage This Unsigned Work.     Thank you for allowing pharmacy to be a part of this patient's care.  Georgina Pillion, PharmD, BCPS Infectious Diseases Clinical Pharmacist 10/20/2022 11:15 AM   **Pharmacist phone directory can now be found on [email or web address] ([email or web address]).  Listed under [facility].

## 2022-10-20 NOTE — Discharge Summary (Signed)
Physician Discharge Summary   Patient: [name] MRN: [id number] DOB: [birth year]  Admit date:     10/16/2022  Discharge date: 10/20/22  Discharge Physician: Arnetha Courser   PCP: Grayce Sessions, NP   Recommendations at discharge:  Please obtain labs as recommended for IV outpatient antibiotic therapy. Follow-up with primary care provider Follow-up with infectious disease Follow-up with urology  Discharge Diagnoses: Principal Problem:   Epididymo-orchitis Active Problems:   Human immunodeficiency virus (HIV) disease (HCC)   Male-to-male transgender person   Smoker   Poorly controlled diabetes mellitus (HCC)   DM type 2 with diabetic peripheral neuropathy (HCC)   Inguinal swelling   Acute cystitis with hematuria   Hospital Course: [name] is a 52 y.o. adult male to male transgender with medical history significant for poorly controlled insulin-dependent type 2 diabetes, HIV on Biktarvy, being admitted with orchitis/epididymitis -symptom onset 1 week prior to admission. Urgent care evaluated and treated with levaquin/rocephin - minimal improvement.  Patient was admitted for concern of failed outpatient therapy.  Cultures with ESBL E. coli so antibiotics were switched to meropenem.  Infectious diseases recommending 2 weeks of therapy.  He will complete the therapy as outpatient with ertapenem.  Midline was placed.  Urology was also consulted and patient was requesting bilateral orchiectomy and they were recommending outpatient follow-up for an elective procedure.  Testicular ultrasound was repeated before discharge which shows an improvement in his infection.  Apparently he will need a referral to see a urologist as outpatient.  Patient will continue with rest of his/her home medications and need to have a close follow-up with her providers for further recommendations.       Consultants: Urology.  Infectious disease Procedures performed: None Disposition:  Home Diet recommendation:  Discharge Diet Orders (From admission, onward)     Start     Ordered   10/20/22 0000  Diet - low sodium heart healthy        10/20/22 1449           Cardiac and Carb modified diet DISCHARGE MEDICATION: Allergies as of 10/20/2022       Reactions   Propoxyphene N-acetaminophen Other (See Comments)   Stomach cramps        Medication List     STOP taking these medications    levofloxacin 500 MG tablet Commonly known as: LEVAQUIN   lisinopril 20 MG tablet Commonly known as: ZESTRIL   ondansetron 4 MG disintegrating tablet Commonly known as: ZOFRAN-ODT       TAKE these medications    Accu-Chek Softclix Lancets lancets 1 each by Other route 3 (three) times daily.   acetaminophen 325 MG tablet Commonly known as: Tylenol Take 2 tablets (650 mg total) by mouth every 6 (six) hours as needed for moderate pain.   Biktarvy 50-200-25 MG Tabs tablet Generic drug: bictegravir-emtricitabine-tenofovir AF Take 1 tablet by mouth daily.   blood glucose meter kit and supplies Dispense based on patient and insurance preference. Use up to four times daily as directed. (FOR ICD-10 E10.9, E11.9).   Colace Clear 50 MG capsule Generic drug: docusate sodium Take 1 capsule (50 mg total) by mouth 2 times daily.   ertapenem  IVPB Commonly known as: INVANZ Inject 1 g into the vein daily for 12 days. Indication:  ESBL E.coli epididymitis, orchitis  First Dose: Yes Last Day of Therapy:  11/01/22 Labs - Once weekly:  CBC/D and BMP, ESR and CRP Method of administration: Mini-Bag Plus / Gravity Pull  PICC/midline at the completion of IV antibiotics Method of administration may be changed at the discretion of home infusion pharmacist based upon assessment of the patient and/or caregiver's ability to self-administer the medication ordered.   escitalopram 20 MG tablet Commonly known as: LEXAPRO TAKE 1 TABLET BY MOUTH AT BEDTIME   estradiol 2 MG tablet Commonly  known as: ESTRACE Take 1 tablet (2 mg total) by mouth daily.   glipiZIDE 10 MG tablet Commonly known as: GLUCOTROL TAKE 1 TABLET BY MOUTH 2 TIMES DAILY BEFORE MEALS   metFORMIN 1000 MG tablet Commonly known as: GLUCOPHAGE Take 1 tablet (1,000 mg total) by mouth 2 (two) times daily with a meal.   multivitamin with minerals Tabs tablet Take 1 tablet by mouth daily. Start taking on: Oct 21, 2022   OneTouch Delica Plus Lancing Misc Use as directed   oxyCODONE 5 MG immediate release tablet Commonly known as: Oxy IR/ROXICODONE Take 1 tablet (5 mg) by mouth every 4 hours as needed for severe pain. What changed:  when to take this reasons to take this   ProAir HFA 108 (90 Base) MCG/ACT inhaler Generic drug: albuterol INHALE 2 PUFFS BY MOUTH INTO LUNGS EVERY 6 HOURS AS NEEDED FOR WHEEZING OR SHORTNESS OF BREATH   rosuvastatin 20 MG tablet Commonly known as: CRESTOR Take 1 tablet (20 mg total) by mouth daily.   spironolactone 100 MG tablet Commonly known as: ALDACTONE Take 1 tablet by mouth daily.   Trulicity 1.5 MG/0.5ML Sopn Generic drug: Dulaglutide Inject 1.5 mg into the skin once a week.               Discharge Care Instructions  (From admission, onward)           Start     Ordered   10/20/22 0000  Change dressing on IV access line weekly and PRN  (Home infusion instructions - [facility] )        10/20/22 1135            Follow-up Information     Grayce Sessions, NP. Schedule an appointment as soon as possible for a visit in 1 week(s).   Specialty: Internal Medicine Contact information: [address] [place] [address] [phone]                Discharge Exam: Ceasar Mons Weights   10/16/22 2015  Weight: 93.4 kg   General.  In no acute distress. Pulmonary.  Lungs clear bilaterally, normal respiratory effort. CV.  Regular rate and rhythm, no JVD, rub or murmur. Abdomen.  Soft, nontender, nondistended, BS  positive. CNS.  Alert and oriented .  No focal neurologic deficit. Extremities.  No edema, no cyanosis, pulses intact and symmetrical. Psychiatry.  Judgment and insight appears normal.   Condition at discharge: stable  The results of significant diagnostics from this hospitalization (including imaging, microbiology, ancillary and laboratory) are listed below for reference.   Imaging Studies: US SCROTUM W/DOPPLER  Result Date: 10/20/2022 CLINICAL DATA:  Scrotal ultrasound follow-up. Right epididymo-orchitis. Evaluate for abscess. EXAM: SCROTAL ULTRASOUND DOPPLER ULTRASOUND OF THE TESTICLES TECHNIQUE: Complete ultrasound examination of the testicles, epididymis, and other scrotal structures was performed. Color and spectral Doppler ultrasound were also utilized to evaluate blood flow to the testicles. COMPARISON:  Testicular sonogram dated 10/17/2022. FINDINGS: Right testicle Measurements: 3.7 x 3.0 x 3.3 cm. No mass or microlithiasis visualized. Left testicle Measurements: 3.3 x 3.2 x 2.4 cm. No mass or microlithiasis visualized. Right epididymis:  Normal in size  and appearance. Left epididymis:  Normal in size and appearance. Hydrocele: Right hydrocele with debris and thick septations, sequela of prior infectious/inflammatory process. Varicocele:  None visualized. Pulsed Doppler interrogation of both testes demonstrates normal low resistance arterial and venous waveforms bilaterally. IMPRESSION: 1. Interval resolution of the right epididymo-orchitis. No evidence of right testicular or epididymal abscess. 2. Right hydrocele with debris and thick septations, sequela of recent infectious/inflammatory process, unchanged. Electronically Signed   By: Larose Hires D.O.   On: 10/20/2022 14:34   US SCROTUM W/DOPPLER  Result Date: 10/17/2022 CLINICAL DATA:  Right testicular pain EXAM: SCROTAL ULTRASOUND DOPPLER ULTRASOUND OF THE TESTICLES TECHNIQUE: Complete ultrasound examination of the testicles, epididymis,  and other scrotal structures was performed. Color and spectral Doppler ultrasound were also utilized to evaluate blood flow to the testicles. COMPARISON:  None Available. FINDINGS: Right testicle Measurements: 4.0 x 3.4 x 3.5 cm. Diffusely increased color flow vascularity. Normal parenchymal echogenicity and echotexture. No mass or microlithiasis visualized. Left testicle Measurements: 3.1 x 2.2 x 2.4 cm. Normal color flow vascularity. Normal parenchymal echogenicity and echotexture. No mass or microlithiasis visualized. Right epididymis: Mildly thickened and heterogeneous. Diffusely increased vascularity. Left epididymis:  Normal in size and appearance. Hydrocele: A complex, moderate right hydrocele is present with echogenic debris and numerous septa Varicocele:  None visualized. Other: There is mild relative thickening of the right scrotal wall. Pulsed Doppler interrogation of both testes demonstrates normal low resistance arterial and venous waveforms bilaterally. IMPRESSION: 1. Findings most consistent with right epididymo-orchitis. 2. Complex, moderate right hydrocele with echogenic debris and numerous septa. 3. Mild relative thickening of the right scrotal wall, likely reactive. Electronically Signed   By: Helyn Numbers M.D.   On: 10/17/2022 02:39   CT PELVIS W CONTRAST  Result Date: 10/17/2022 CLINICAL DATA:  Scrotal swelling or edema EXAM: CT PELVIS WITH CONTRAST TECHNIQUE: Multidetector CT imaging of the pelvis was performed using the standard protocol following the bolus administration of intravenous contrast. RADIATION DOSE REDUCTION: This exam was performed according to the departmental dose-optimization program which includes automated exposure control, adjustment of the mA and/or kV according to patient size and/or use of iterative reconstruction technique. CONTRAST:  OMNIPAQUE IOHEXOL 300 MG/ML  SOLN COMPARISON:  05/02/2017 FINDINGS: Urinary Tract: Punctate nonobstructing stone in the lower  pole of the right kidney. No hydronephrosis. Urinary bladder grossly unremarkable. Bowel: Moderate stool burden throughout the visualized large bowel. Small bowel decompressed within the pelvis. Vascular/Lymphatic: Scattered calcifications. No aneurysm, dissection or significant stenosis. Reproductive: Prostate unremarkable. Bilateral hydroceles noted. Right testicle visualized. Left testicle not visualized. Other:  No free fluid or free air. Musculoskeletal: No acute bony abnormality. Postoperative changes in the lower lumbar spine. IMPRESSION: Bilateral scrotal hydroceles noted. If there is clinical concern for testicular abnormality, ultrasound would be a more sensitive test. Moderate stool burden throughout the colon. Punctate right lower pole nephrolithiasis. Electronically Signed   By: Charlett Nose M.D.   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 No erosions or abnormal periosteal reaction. No acute fracture or dislocation. 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 Soft tissues are unremarkable. IMPRESSION: 1. 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 No radiographic evidence of osteomyelitis. Electronically Signed  By: Helyn Numbers M.D.   On: 10/16/2022 23:25    Microbiology: Results for orders placed or performed during the hospital encounter of 10/16/22  Blood culture (routine x 2)     Status: None (Preliminary result)   Collection Time: 10/17/22  3:52 AM   Specimen: BLOOD  Result Value Ref Range Status   Specimen Description   Final    BLOOD BLOOD RIGHT HAND Performed at [facility], [address] [place], [place] [address]    Special  Requests   Final    BOTTLES DRAWN AEROBIC AND ANAEROBIC Blood Culture results may not be optimal due to an inadequate volume of blood received in culture bottles Performed at [facility], [address] [place], [place] [address]    Culture   Final    NO GROWTH 3 DAYS Performed at [facility], [address] [place], [place] [address]    Report Status PENDING  Incomplete  Blood culture (routine x 2)     Status: None (Preliminary result)   Collection Time: 10/17/22  8:13 AM   Specimen: BLOOD  Result Value Ref Range Status   Specimen Description   Final    BLOOD BLOOD LEFT HAND Performed at [facility], [address], [place], [place] [address]    Special Requests   Final    BOTTLES DRAWN AEROBIC AND ANAEROBIC Blood Culture results may not be optimal due to an excessive volume of blood received in culture bottles Performed at [facility], [address], [place], [place] [address]    Culture   Final    NO GROWTH 3 DAYS Performed at [facility], [address] [place], [place] [address]    Report Status PENDING  Incomplete    Labs: CBC: Recent Labs  Lab 10/16/22 2341 10/17/22 0645 10/18/22 0624 10/19/22 0614 10/20/22 0603  WBC 16.2* 13.1* 9.7 10.1 10.0  NEUTROABS 12.3*  --   --   --   --   HGB 12.4* 11.6* 12.8* 13.0 13.4  HCT 36.7* 34.4* 38.1* 38.3* 39.4  MCV 92.0 91.2 91.8 91.0 90.2  PLT 310 306 355 365 378   Basic Metabolic Panel: Recent Labs  Lab 10/16/22 2341 10/17/22 0645 10/18/22 0624 10/19/22 0614 10/20/22 0603  NA 136 132* 132* 132* 132*  K 4.0 3.4* 3.9 4.5 4.9  CL 100 98 96* 98 98  CO2 27 25 26 29 30   GLUCOSE 179* 193* 173* 147* 164*  BUN 20 17 15 15  21*  CREATININE 0.67 0.66 0.73 0.73 0.78  CALCIUM 8.9 8.6* 8.5* 8.8* 8.9   Liver Function Tests: Recent Labs  Lab 10/16/22 2341  AST 12*  ALT 12  ALKPHOS 93  BILITOT 0.6  PROT 8.3*  ALBUMIN 3.2*   CBG: Recent Labs   Lab 10/19/22 1123 10/19/22 1618 10/19/22 2204 10/20/22 0817 10/20/22 1141  GLUCAP 168* 200* 169* 179* 227*    Discharge time spent: greater than 30 minutes.  This record has been created using Conservation officer, historic buildings. Errors have been sought and corrected,but may not always be located. Such creation errors do not reflect on the standard of care.   Signed: Arnetha Courser, MD [facility] 10/20/2022

## 2022-10-20 NOTE — TOC Progression Note (Addendum)
Transition of Care [facility]) - Progression Note    Patient Details  Name: [name] MRN: [id number] Date of Birth: [birth year]  Transition of [facility]) CM/SW Contact  Beckie Busing, RN Phone Number:[phone]  10/20/2022, 10:22 AM  Clinical Narrative:    TOC following for patient with ID recommendations for home infusion for 2 weeks IV abx. Referral information has been sent to [facility] with [facility] for home infusion set up.   1547  TOC continues to follow for set up of home IV antibiotic infusions. CM spoke with Pam at [employer] [facility] who states that [facility] can not accept [facility] but she made referral for [facility] who will be able to assist with home antibiotic infusions. [facility] currently attempting to secure nursing for visit on tomorrow. [facility] will notify Pam as soon as nursing visit is secure for teaching and home follow up. Patient is not currently ready for d/c with home infusion until nursing issue is resolved. CM will update team when patient is  clear from home infusion standpoint.   1603 Awaiting ok from [facility] [phone]  1612 CM has been updated per Jeri Modena that [facility]  has a nurse that can see the patient tomorrow afternoon. Meds will be driven out to the home tomorrow for an afternoon visit. Patient may discharge. Team will give patient a call later this evening or tomorrow morning. Nurse has been updated.        Expected Discharge Plan and Services                                               Social Determinants of Health (SDOH) Interventions SDOH Screenings   Food Insecurity: Food Insecurity Present (10/17/2022)  Housing: High Risk (10/17/2022)  Transportation Needs: Unmet Transportation Needs (10/17/2022)  Utilities: Not At Risk (10/17/2022)  Depression (PHQ2-9): Low Risk  (07/20/2022)  Recent Concern: Depression (PHQ2-9) - High Risk (06/30/2022)  Physical Activity: Insufficiently Active (03/01/2021)  Tobacco Use: High Risk (10/16/2022)    Readmission  Risk Interventions     No data to display

## 2022-10-20 NOTE — Progress Notes (Addendum)
Urology Progress note   History of Present Illness: [name] is a male to male transgender patient presenting to [facility] with complaint of right testicular pain, dysuria, right low back pain, and hematuria.  PMH significant for HIV on Biktarvy, T2DM, HTN, TLIF fixation, and recent pneumonia.  On my assessment she was alert, oriented, and in no distress.  Patient proved to be a good historian and reports long-term compliance with PrEP therapy.  Despite that, she has struggled with consecutive infections, which may be more due to diabetes than suppressed immunity.  [facility] was consulted to speak to the epididymoorchitis with resistant E. coli.  5/9: NAEON. Pt resting on rounds, Reports pain and swelling has subsided soemwhat.   Past Medical History:  Diagnosis Date   Anxiety    Asthma    Depression    Diabetes mellitus    Gait abnormality    GERD (gastroesophageal reflux disease)    HIV disease (HCC)    Hypertension    Loss of balance 09/14/2020   MRSA carrier    neck   Perirectal abscess 03/30/2017   Pneumonia    Polyuria 04/27/2015   Poorly controlled diabetes mellitus (HCC) 04/27/2015   Testicular mass 06/20/2016   Transgender 04/27/2015   Past Surgical History:  Procedure Laterality Date   APPENDECTOMY     TRANSFORAMINAL LUMBAR INTERBODY FUSION (TLIF) WITH PEDICLE SCREW FIXATION 3 LEVEL N/A 01/09/2019   Procedure: TRANSFORAMINAL LUMBAR INTERBODY FUSION (TLIF) WITH PEDICLE SCREW FIXATION 3 LEVEL, POSTERIOR SPINAL FUSION;  Surgeon: Venetia Night, MD;  Location: [facility] ORS;  Service: Neurosurgery;  Laterality: N/A;    Allergies:  Allergies  Allergen Reactions   Propoxyphene N-Acetaminophen Other (See Comments)    Stomach cramps    Family History  Problem Relation Age of Onset   Other Mother        unsure of history   Heart failure Father    Diabetes Father    Social History:  reports that she has been smoking cigarettes. She has a 2.70  pack-year smoking history. She has never used smokeless tobacco. She reports that she does not currently use drugs after having used the following drugs: Marijuana. She reports that she does not drink alcohol.  ROS: All systems are reviewed and negative except as noted. Right testicular pain Right flank pain Pubic tenderness  Physical Exam:  Vital signs in last 24 hours: Temp:  [97.9 F (36.6 C)-98.8 F (37.1 C)] 98 F (36.7 C) (05/09 0625) Pulse Rate:  [70-104] 71 (05/09 0625) Resp:  [18] 18 (05/09 0625) BP: (110-113)/(64-81) 113/81 (05/09 0625) SpO2:  [95 %-98 %] 95 % (05/09 0625)  Cardiovascular: Skin warm; not flushed Respiratory: Breaths quiet; no shortness of breath Abdomen: No masses, soft, no guarding or rebound Neurological: Normal sensation to touch Musculoskeletal: Normal motor function arms and legs Skin: No rashes Genitourinary: bilateral hydroceles with reactive right. Right epididimoorchitis  Laboratory Data:  Results for orders placed or performed during the hospital encounter of 10/16/22 (from the past 72 hour(s))  Glucose, capillary     Status: Abnormal   Collection Time: 10/17/22  2:07 PM  Result Value Ref Range   Glucose-Capillary 142 (H) 70 - 99 mg/dL    Comment: Glucose reference range applies only to samples taken after fasting for at least 8 hours.  Glucose, capillary     Status: Abnormal   Collection Time: 10/17/22  4:35 PM  Result Value Ref Range   Glucose-Capillary 183 (H) 70 - 99 mg/dL  Comment: Glucose reference range applies only to samples taken after fasting for at least 8 hours.  Glucose, capillary     Status: Abnormal   Collection Time: 10/17/22  9:39 PM  Result Value Ref Range   Glucose-Capillary 165 (H) 70 - 99 mg/dL    Comment: Glucose reference range applies only to samples taken after fasting for at least 8 hours.  CBC     Status: Abnormal   Collection Time: 10/18/22  6:24 AM  Result Value Ref Range   WBC 9.7 4.0 - 10.5 K/uL    RBC 4.15 (L) 4.22 - 5.81 MIL/uL   Hemoglobin 12.8 (L) 13.0 - 17.0 g/dL   HCT 16.1 (L) 09.6 - 04.5 %   MCV 91.8 80.0 - 100.0 fL   MCH 30.8 26.0 - 34.0 pg   MCHC 33.6 30.0 - 36.0 g/dL   RDW 40.9 81.1 - 91.4 %   Platelets 355 150 - 400 K/uL   nRBC 0.0 0.0 - 0.2 %    Comment: Performed at [facility], [address] [place], [place] [address]  Basic metabolic panel     Status: Abnormal   Collection Time: 10/18/22  6:24 AM  Result Value Ref Range   Sodium 132 (L) 135 - 145 mmol/L   Potassium 3.9 3.5 - 5.1 mmol/L   Chloride 96 (L) 98 - 111 mmol/L   CO2 26 22 - 32 mmol/L   Glucose, Bld 173 (H) 70 - 99 mg/dL    Comment: Glucose reference range applies only to samples taken after fasting for at least 8 hours.   BUN 15 6 - 20 mg/dL   Creatinine, Ser 6.21 0.61 - 1.24 mg/dL   Calcium 8.5 (L) 8.9 - 10.3 mg/dL   GFR, Estimated >30 >86 mL/min    Comment: (NOTE) Calculated using the CKD-EPI Creatinine Equation (2021)    Anion gap 10 5 - 15    Comment: Performed at [facility], [address], [place], [place] [address]  Glucose, capillary     Status: Abnormal   Collection Time: 10/18/22  8:07 AM  Result Value Ref Range   Glucose-Capillary 147 (H) 70 - 99 mg/dL    Comment: Glucose reference range applies only to samples taken after fasting for at least 8 hours.  Glucose, capillary     Status: Abnormal   Collection Time: 10/18/22 11:53 AM  Result Value Ref Range   Glucose-Capillary 174 (H) 70 - 99 mg/dL    Comment: Glucose reference range applies only to samples taken after fasting for at least 8 hours.  Urine cytology ancillary only     Status: None   Collection Time: 10/18/22  1:08 PM  Result Value Ref Range   Neisseria Gonorrhea Negative    Chlamydia Negative    Trichomonas Negative    Bacterial Vaginitis-Urine Negative    Molecular Comment      For tests bacteria and/or candida, this specimen does not meet the   Molecular Comment      strict  criteria set by the FDA. The result interpretation should be   Molecular Comment      considered in conjunction with the patient's clinical history.   Comment Normal Reference Ranger Chlamydia - Negative    Comment      Normal Reference Range Neisseria Gonorrhea - Negative   Comment Normal Reference Range Trichomonas - Negative   Glucose, capillary     Status: Abnormal   Collection Time: 10/18/22  4:39 PM  Result Value Ref Range   Glucose-Capillary 177 (H) 70 - 99 mg/dL    Comment: Glucose reference range applies only to samples taken after fasting for at least 8 hours.  Glucose, capillary     Status: Abnormal   Collection Time: 10/18/22  8:45 PM  Result Value Ref Range   Glucose-Capillary 160 (H) 70 - 99 mg/dL    Comment: Glucose reference range applies only to samples taken after fasting for at least 8 hours.  CBC     Status: Abnormal   Collection Time: 10/19/22  6:14 AM  Result Value Ref Range   WBC 10.1 4.0 - 10.5 K/uL   RBC 4.21 (L) 4.22 - 5.81 MIL/uL   Hemoglobin 13.0 13.0 - 17.0 g/dL   HCT 16.1 (L) 09.6 - 04.5 %   MCV 91.0 80.0 - 100.0 fL   MCH 30.9 26.0 - 34.0 pg   MCHC 33.9 30.0 - 36.0 g/dL   RDW 40.9 81.1 - 91.4 %   Platelets 365 150 - 400 K/uL   nRBC 0.0 0.0 - 0.2 %    Comment: Performed at [facility], [address], [place], [place] [address]  Basic metabolic panel     Status: Abnormal   Collection Time: 10/19/22  6:14 AM  Result Value Ref Range   Sodium 132 (L) 135 - 145 mmol/L   Potassium 4.5 3.5 - 5.1 mmol/L   Chloride 98 98 - 111 mmol/L   CO2 29 22 - 32 mmol/L   Glucose, Bld 147 (H) 70 - 99 mg/dL    Comment: Glucose reference range applies only to samples taken after fasting for at least 8 hours.   BUN 15 6 - 20 mg/dL   Creatinine, Ser 6.21 0.61 - 1.24 mg/dL   Calcium 8.8 (L) 8.9 - 10.3 mg/dL   GFR, Estimated >30 >86 mL/min    Comment: (NOTE) Calculated using the CKD-EPI Creatinine Equation (2021)    Anion gap 5 5 - 15    Comment:  Performed at [facility], [address] [place], [place] [address]  Glucose, capillary     Status: Abnormal   Collection Time: 10/19/22  7:38 AM  Result Value Ref Range   Glucose-Capillary 142 (H) 70 - 99 mg/dL    Comment: Glucose reference range applies only to samples taken after fasting for at least 8 hours.  Glucose, capillary     Status: Abnormal   Collection Time: 10/19/22 11:23 AM  Result Value Ref Range   Glucose-Capillary 168 (H) 70 - 99 mg/dL    Comment: Glucose reference range applies only to samples taken after fasting for at least 8 hours.  Glucose, capillary     Status: Abnormal   Collection Time: 10/19/22  4:18 PM  Result Value Ref Range   Glucose-Capillary 200 (H) 70 - 99 mg/dL    Comment: Glucose reference range applies only to samples taken after fasting for at least 8 hours.  Glucose, capillary     Status: Abnormal   Collection Time: 10/19/22 10:04 PM  Result Value Ref Range   Glucose-Capillary 169 (H) 70 - 99 mg/dL    Comment: Glucose reference range applies only to samples taken after fasting for at least 8 hours.   Comment 1 Notify RN    Comment 2 Document in Chart   CBC     Status: None   Collection Time: 10/20/22  6:03 AM  Result Value Ref Range   WBC 10.0 4.0 - 10.5  K/uL   RBC 4.37 4.22 - 5.81 MIL/uL   Hemoglobin 13.4 13.0 - 17.0 g/dL   HCT 16.1 09.6 - 04.5 %   MCV 90.2 80.0 - 100.0 fL   MCH 30.7 26.0 - 34.0 pg   MCHC 34.0 30.0 - 36.0 g/dL   RDW 40.9 81.1 - 91.4 %   Platelets 378 150 - 400 K/uL   nRBC 0.0 0.0 - 0.2 %    Comment: Performed at [facility], [address] [place], [place] [address]  Basic metabolic panel     Status: Abnormal   Collection Time: 10/20/22  6:03 AM  Result Value Ref Range   Sodium 132 (L) 135 - 145 mmol/L   Potassium 4.9 3.5 - 5.1 mmol/L   Chloride 98 98 - 111 mmol/L   CO2 30 22 - 32 mmol/L   Glucose, Bld 164 (H) 70 - 99 mg/dL    Comment: Glucose reference range applies only to  samples taken after fasting for at least 8 hours.   BUN 21 (H) 6 - 20 mg/dL   Creatinine, Ser 6.21 0.61 - 1.24 mg/dL   Calcium 8.9 8.9 - 30.8 mg/dL   GFR, Estimated >65 >78 mL/min    Comment: (NOTE) Calculated using the CKD-EPI Creatinine Equation (2021)    Anion gap 4 (L) 5 - 15    Comment: Performed at [facility], [address], [place], [place] [address]  Glucose, capillary     Status: Abnormal   Collection Time: 10/20/22  8:17 AM  Result Value Ref Range   Glucose-Capillary 179 (H) 70 - 99 mg/dL    Comment: Glucose reference range applies only to samples taken after fasting for at least 8 hours.   Recent Results (from the past 240 hour(s))  Urine Culture     Status: Abnormal   Collection Time: 10/14/22 12:44 PM   Specimen: Urine, Clean Catch  Result Value Ref Range Status   Specimen Description URINE, CLEAN CATCH  Final   Special Requests   Final    NONE Performed at [facility], [address] [place], [place] [address]    Culture (A)  Final    >=100,000 COLONIES/mL ESCHERICHIA COLI Confirmed Extended Spectrum Beta-Lactamase Producer (ESBL).  In bloodstream infections from ESBL organisms, carbapenems are preferred over piperacillin/tazobactam. They are shown to have a lower risk of mortality.    Report Status 10/16/2022 FINAL  Final   Organism ID, Bacteria ESCHERICHIA COLI (A)  Final      Susceptibility   Escherichia coli - MIC*    AMPICILLIN >=32 RESISTANT Resistant     CEFAZOLIN >=64 RESISTANT Resistant     CEFEPIME 4 INTERMEDIATE Intermediate     CEFTRIAXONE >=64 RESISTANT Resistant     CIPROFLOXACIN 1 RESISTANT Resistant     GENTAMICIN <=1 SENSITIVE Sensitive     IMIPENEM <=0.25 SENSITIVE Sensitive     NITROFURANTOIN <=16 SENSITIVE Sensitive     TRIMETH/SULFA >=320 RESISTANT Resistant     AMPICILLIN/SULBACTAM 4 SENSITIVE Sensitive     PIP/TAZO <=4 SENSITIVE Sensitive     * >=100,000 COLONIES/mL ESCHERICHIA COLI  Blood culture  (routine x 2)     Status: None (Preliminary result)   Collection Time: 10/17/22  3:52 AM   Specimen: BLOOD  Result Value Ref Range Status   Specimen Description   Final    BLOOD BLOOD RIGHT HAND Performed at [facility], [address], [place], [place] [address]  Special Requests   Final    BOTTLES DRAWN AEROBIC AND ANAEROBIC Blood Culture results may not be optimal due to an inadequate volume of blood received in culture bottles Performed at [facility], [address], [place], [place] [address]    Culture   Final    NO GROWTH 3 DAYS Performed at [facility], [address], [place], [place] [address]    Report Status PENDING  Incomplete  Blood culture (routine x 2)     Status: None (Preliminary result)   Collection Time: 10/17/22  8:13 AM   Specimen: BLOOD  Result Value Ref Range Status   Specimen Description   Final    BLOOD BLOOD LEFT HAND Performed at [facility], [address] [place], [place] [address]    Special Requests   Final    BOTTLES DRAWN AEROBIC AND ANAEROBIC Blood Culture results may not be optimal due to an excessive volume of blood received in culture bottles Performed at [facility], [address] [place], [place] [address]    Culture   Final    NO GROWTH 3 DAYS Performed at [facility], [address], [place], [place] [address]    Report Status PENDING  Incomplete   Creatinine: Recent Labs    10/16/22 2341 10/17/22 0645 10/18/22 0624 10/19/22 0614 10/20/22 0603  CREATININE 0.67 0.66 0.73 0.73 0.78    Imaging: On independent review of CT ABD/pelvis there is no hydronephrosis to suggest obstructing urinary stone.  Bilateral hydroceles, right is reactive.  While not mentioned on CT imaging, there is significant stranding transversing the right inguinal canal.   Assessment/Plan:  Urine culture positive for multidrug-resistant E. coli.  Physical  exam correlates with imaging, exquisite tenderness going from the epididymis through the pubic symphysis and up right inguinal canal.  There is some swelling and redness. No area of fluctuance, crepitus, or streaking. There is no gas or signs of abscess formation at this time.  inguinal and pubic swelling/tenderness has improved over the last 48 hours.  Right testicle is still exquisitely tender.  Patient reports at the end of urination patient expelled a greenish-gray substance that looked like "the inside of an abscess".    ABX per infectious disease  No indication for surgical intervention at this time  Patient has expressed desire for bilateral orchiectomy.  Referral to [facility] transgender clinic in the future  PICC in place for outpt meropenem  Discussed wearing underwear with good scrotal support and NSAIDS.  Testicular US pending   Follow up in clinic 2 weeks.   [name] [name] 10/20/2022, 10:18 AM  Pager: [phone]

## 2022-10-22 LAB — CULTURE, BLOOD (ROUTINE X 2)

## 2022-10-24 ENCOUNTER — Telehealth: Payer: Self-pay

## 2022-10-24 NOTE — Telephone Encounter (Signed)
Franky Macho- I completed the discharge call and she has a question for you:  She said she has a Freestyle meter and her roomate had her iphone turned off.  She now has a government issued android phone and is not sure if it is compatible with the Freestyle app.   Please advise thanks

## 2022-10-24 NOTE — Transitions of Care (Post Inpatient/ED Visit) (Signed)
10/24/2022  Name: [name] MRN: [id number] DOB: 09/25/1970  Today's TOC FU Call Status: Today's TOC FU Call Status:: Successful TOC FU Call Competed TOC FU Call Complete Date: 10/24/22  Transition Care Management Follow-up Telephone Call Date of Discharge: 10/22/22 Discharge Facility: [facility]) Type of Discharge: Inpatient Admission Primary Inpatient Discharge Diagnosis:: epididymo-orchitis How have you been since you were released from the hospital?: Better Any questions or concerns?: Yes Patient Questions/Concerns:: She said she has a Freestyle meter and has been participating with [name], RPH on a study about diabetes.  She said that her roomate had her iphone turned off and she now has a government issued android phone but is not sure if she can use the android with the Freestyle app.  I told her that I would need to check with [name] about that. Patient Questions/Concerns Addressed: Other: (message sent to [name], [name] about compatabiity of Freestyle app with android phone)  Items Reviewed: Did you receive and understand the discharge instructions provided?: Yes Medications obtained,verified, and reconciled?: Yes (Medications Reviewed) (She said she has all medications and did not have any questions about the med regime and did not need to review the med list.  She has a standard glucometer as well as a Freestyle. See note above re. freestyle) Any new allergies since your discharge?: No Dietary orders reviewed?: Yes Type of Diet Ordered:: Heart healthy diabetic Do you have support at home?: Yes People in Home: sibling(s) Name of Support/Comfort Primary Source: She is staying with her sister on her sister's couch.  Medications Reviewed Today:  She said she has all medications and did not have any questions about the med regime   Medications Reviewed Today     Reviewed by [name], [name] (Pharmacy Technician) on 10/17/22 at 0246  Med List  Status: Complete   Medication Order Taking? Sig Documenting Provider Last Dose Status Informant  ACCU-CHEK SOFTCLIX LANCETS lancets [id number]  1 each by Other route 3 (three) times daily. [name] Specter, PA-[name]  Active Self           Med Note [facility], [name]   Tue Sep 15, 2020 12:52 PM)    acetaminophen (TYLENOL) 325 MG tablet [id number] Yes Take 2 tablets (650 mg total) by mouth every 6 (six) hours as needed for moderate pain. [name] Bamberg, PA-[name] 10/16/2022 Active Self  bictegravir-emtricitabine-tenofovir AF (BIKTARVY) 50-200-25 MG TABS tablet [id number] Yes Take 1 tablet by mouth daily. [name] Kelch, NP 10/16/2022 Active Self  blood glucose meter kit and supplies [id number]  Dispense based on patient and insurance preference. Use up to four times daily as directed. (FOR ICD-10 E10.9, E11.9). [name] Sessions, NP  Active Self  docusate sodium (COLACE CLEAR) 50 MG capsule [id number] No Take 1 capsule (50 mg total) by mouth 2 times daily.  Patient not taking: Reported on 07/20/2022    Not Taking Active Self  Dulaglutide (TRULICITY) 1.5 MG/0.5ML SOPN [id number] Yes Inject 1.5 mg into the skin once a week. [name] Register, MD 10/12/2022 Active Self  escitalopram (LEXAPRO) 20 MG tablet [id number] Yes TAKE 1 TABLET BY MOUTH AT BEDTIME [name] Sessions, NP 10/15/2022 Active Self  estradiol (ESTRACE) 2 MG tablet [id number] Yes Take 1 tablet (2 mg total) by mouth daily. [name] Kelch, NP 10/15/2022 Active Self  glipiZIDE (GLUCOTROL) 10 MG tablet [id number] No TAKE 1 TABLET BY MOUTH 2 TIMES DAILY BEFORE MEALS [name] Sessions, NP 10/13/2022 Active Self  Med Note [name] I   Mon Oct 17, 2022  2:44 AM) On Hold  Lancet Devices [facility] DELICA PLUS LANCING) [place] [id number]  Use as directed   Active Self  levofloxacin (LEVAQUIN) 500 MG tablet [id number] Yes Take 1 tablet (500 mg total) by mouth daily. [name] Bamberg, PA-[name] 10/16/2022 Active Self           Med Note [name], [place] I    Mon Oct 17, 2022  2:44 AM) ABT Start Date 10/14/22  lisinopril (ZESTRIL) 20 MG tablet [id number] No Take 1 tablet (20 mg total) by mouth daily.  Patient not taking: Reported on 10/17/2022   [name] Sessions, NP Not Taking Active Self  metFORMIN (GLUCOPHAGE) 1000 MG tablet [id number] Yes Take 1 tablet (1,000 mg total) by mouth 2 (two) times daily with a meal. [name] Sessions, NP 10/16/2022 Active Self  ondansetron (ZOFRAN-ODT) 4 MG disintegrating tablet [id number] No Dissolve 1 tablet (4 mg total) in mouth every 8 (eight) hours as needed for nausea  Patient not taking: Reported on 07/20/2022    Not Taking Active Self  ondansetron (ZOFRAN-ODT) 4 MG disintegrating tablet [id number] No Dissolve 1 tablet (4 mg total) by mouth every 12 (twelve) hours as needed for nausea. Take 20 minutes prior to daily Ceftriaxone dose.  Patient not taking: Reported on 10/17/2022    Completed Course Active Self  oxyCODONE (OXY IR/ROXICODONE) 5 MG immediate release tablet [id number] No Take 1 tablet (5 mg total) by mouth every 6 (six) hours as needed for up to 7 days for Severe pain (7-10) or Moderate pain (4-6).  Patient not taking: Reported on 10/17/2022    Completed Course Active Self  PROAIR HFA 108 (90 Base) MCG/ACT inhaler [id number]  INHALE 2 PUFFS BY MOUTH INTO LUNGS EVERY 6 HOURS AS NEEDED FOR WHEEZING OR SHORTNESS OF [name] Brood, NP  Expired 03/23/21 2359   rosuvastatin (CRESTOR) 20 MG tablet [id number] Yes Take 1 tablet (20 mg total) by mouth daily. [name] Register, MD 10/16/2022 Active Self  spironolactone (ALDACTONE) 100 MG tablet [id number] Yes Take 1 tablet by mouth daily. [provider] Past Week Active Self            Home Care and Equipment/Supplies: Were Home Health Services Ordered?: Yes Name of Home Health Agency:: [facility] (She said that she received a video of how to administer the IV med and the nurse will be out 5/15 and weekly for PICC dressing changes and labs. . In the  meantime, she has been able to administer the IV med without any difficulty.) Has Agency set up a time to come to your home?: Yes First Home Health Visit Date: 10/26/22 Any new equipment or medical supplies ordered?: Yes Name of Medical supply agency?: [facility] - IV medication and administration supplies as well as PICC dressing supplies. Were you able to get the equipment/medical supplies?: Yes Do you have any questions related to the use of the equipment/supplies?: No  Functional Questionnaire: Do you need assistance with bathing/showering or dressing?: No Do you need assistance with meal preparation?: No Do you need assistance with eating?: No Do you have difficulty maintaining continence: No Do you need assistance with getting out of bed/getting out of a chair/moving?: No (She said her  gait has been unsteady and she may benefit from a cane.) Do you have difficulty managing or taking your medications?: No  Follow up appointments reviewed: PCP Follow-up appointment confirmed?: Yes Date of PCP follow-up appointment?: 10/31/22 Follow-up Provider: Marcelino Duster  Randa Evens, NP Specialist Hospital Follow-up appointment confirmed?: Yes Date of Specialist follow-up appointment?: 11/02/22 Follow-Up Specialty Provider:: [facility] Do you need transportation to your follow-up appointment?: No Do you understand care options if your condition(s) worsen?: Yes-patient verbalized understanding    SIGNATURE Robyne Peers, RN

## 2022-10-26 DIAGNOSIS — R1909 Other intra-abdominal and pelvic swelling, mass and lump: Secondary | ICD-10-CM | POA: Diagnosis not present

## 2022-10-31 ENCOUNTER — Ambulatory Visit (INDEPENDENT_AMBULATORY_CARE_PROVIDER_SITE_OTHER): Payer: Medicaid Other | Admitting: Primary Care

## 2022-10-31 VITALS — BP 108/73 | HR 89 | Resp 16 | Wt 214.6 lb

## 2022-10-31 DIAGNOSIS — E1165 Type 2 diabetes mellitus with hyperglycemia: Secondary | ICD-10-CM | POA: Diagnosis not present

## 2022-10-31 DIAGNOSIS — N453 Epididymo-orchitis: Secondary | ICD-10-CM

## 2022-10-31 DIAGNOSIS — Z09 Encounter for follow-up examination after completed treatment for conditions other than malignant neoplasm: Secondary | ICD-10-CM | POA: Diagnosis not present

## 2022-10-31 NOTE — Progress Notes (Signed)
[facility]   Subjective:  [name] is a 52 y.o. adult presents for hospital follow up. Patient went to UC  on 10/14/22 for worsening dysuria, right flank pain, right low back pain, bloody urine. with severe testicle pain. Placed on abt's and sent home. Pain continued despite taking abt's and presented to the ED .  Admit date to the hospital was 10/16/22, patient was discharged from the hospital on 10/20/22, patient was admitted for: Human immunodeficiency virus (HIV) disease ,Male-to-male transgender person, Smoker Poorly controlled diabetes mellitus, DM type 2 with diabetic peripheral neuropathy ,Epididymo-orchitis ,Inguinal swelling and Acute cystitis with hematuria. Patient has No headache, No chest pain, No abdominal pain - No Nausea, No new weakness tingling or numbness, No Cough - shortness of breath.   Past Medical History:  Diagnosis Date   Anxiety    Asthma    Depression    Diabetes mellitus    Gait abnormality    GERD (gastroesophageal reflux disease)    HIV disease (HCC)    Hypertension    Loss of balance 09/14/2020   MRSA carrier    neck   Perirectal abscess 03/30/2017   Pneumonia    Polyuria 04/27/2015   Poorly controlled diabetes mellitus (HCC) 04/27/2015   Testicular mass 06/20/2016   Transgender 04/27/2015     Allergies  Allergen Reactions   Propoxyphene N-Acetaminophen Other (See Comments)    Stomach cramps      Current Outpatient Medications on File Prior to Visit  Medication Sig Dispense Refill   ACCU-CHEK SOFTCLIX LANCETS lancets 1 each by Other route 3 (three) times daily. 100 each 5   acetaminophen (TYLENOL) 325 MG tablet Take 2 tablets (650 mg total) by mouth every 6 (six) hours as needed for moderate pain. 30 tablet 0   bictegravir-emtricitabine-tenofovir AF (BIKTARVY) 50-200-25 MG TABS tablet Take 1 tablet by mouth daily. 30 tablet 0   blood glucose meter kit and supplies Dispense based on patient and insurance preference. Use up  to four times daily as directed. (FOR ICD-10 E10.9, E11.9). 1 each 0   docusate sodium (COLACE CLEAR) 50 MG capsule Take 1 capsule (50 mg total) by mouth 2 times daily. (Patient not taking: Reported on 07/20/2022) 10 capsule 0   Dulaglutide (TRULICITY) 1.5 MG/0.5ML SOPN Inject 1.5 mg into the skin once a week. 2 mL 3   ertapenem (INVANZ) IVPB Inject 1 g into the vein daily for 12 days. Indication:  ESBL E.coli epididymitis, orchitis  First Dose: Yes Last Day of Therapy:  11/01/22 Labs - Once weekly:  CBC/D and BMP, ESR and CRP Method of administration: Mini-Bag Plus / Gravity Pull PICC/midline at the completion of IV antibiotics Method of administration may be changed at the discretion of home infusion pharmacist based upon assessment of the patient and/or caregiver's ability to self-administer the medication ordered. 12 Units 0   escitalopram (LEXAPRO) 20 MG tablet TAKE 1 TABLET BY MOUTH AT BEDTIME 90 tablet 1   estradiol (ESTRACE) 2 MG tablet Take 1 tablet (2 mg total) by mouth daily. 30 tablet 11   glipiZIDE (GLUCOTROL) 10 MG tablet TAKE 1 TABLET BY MOUTH 2 TIMES DAILY BEFORE MEALS 180 tablet 1   Lancet Devices (ONETOUCH DELICA PLUS LANCING) MISC Use as directed 1 each 0   metFORMIN (GLUCOPHAGE) 1000 MG tablet Take 1 tablet (1,000 mg total) by mouth 2 (two) times daily with a meal. 180 tablet 1   Multiple Vitamin (MULTIVITAMIN WITH MINERALS) TABS tablet Take 1 tablet  by mouth daily. 90 tablet 0   oxyCODONE (OXY IR/ROXICODONE) 5 MG immediate release tablet Take 1 tablet (5 mg) by mouth every 4 hours as needed for severe pain. 30 tablet 0   PROAIR HFA 108 (90 Base) MCG/ACT inhaler INHALE 2 PUFFS BY MOUTH INTO LUNGS EVERY 6 HOURS AS NEEDED FOR WHEEZING OR SHORTNESS OF BREATH 8.5 g 1   rosuvastatin (CRESTOR) 20 MG tablet Take 1 tablet (20 mg total) by mouth daily. 90 tablet 1   spironolactone (ALDACTONE) 100 MG tablet Take 1 tablet by mouth daily.     No current facility-administered medications on  file prior to visit.     Review of System: Comprehensive ROS Pertinent positive and negative noted in HPI    Objective:  Blood Pressure 108/73   Pulse 89   Respiration 16   Weight 214 lb 9.6 oz (97.3 kg)   Oxygen Saturation 98%   Body Mass Index 29.93 kg/m   Filed Weights   10/31/22 1455  Weight: 214 lb 9.6 oz (97.3 kg)    Physical Exam: General Appearance: Well nourished, in no apparent distress. Eyes: PERRLA, EOMs, conjunctiva no swelling or erythema Sinuses: No Frontal/maxillary tenderness ENT/Mouth: Ext aud canals clear, TMs without erythema, bulging. Hearing normal.  Neck: Supple, thyroid normal.  Respiratory: Respiratory effort normal, BS equal bilaterally without rales, rhonchi, wheezing or stridor.  Cardio: RRR with no MRGs. Brisk peripheral pulses without edema.  Abdomen: Soft, + BS.  Non tender, no guarding, rebound, hernias, masses. Lymphatics: Non tender without lymphadenopathy.  Musculoskeletal: Full ROM, 5/5 strength, normal gait.  Skin: Warm, dry without rashes, lesions, ecchymosis.  Neuro: Cranial nerves intact. Normal muscle tone, no cerebellar symptoms. Sensation intact.  Psych: Awake and oriented X 3, normal affect, Insight and Judgment appropriate.    Assessment:  [name] was seen today for hospitalization follow-up.  Diagnoses and all orders for this visit:  Epididymo-orchitis -     Ambulatory referral to Urology  Hospital discharge follow-up F/u with PCP  Poorly controlled diabetes mellitus (HCC) -     Hemoglobin A1c; Future  Complications from uncontrolled diabetes -diabetic retinopathy leading to blindness, diabetic nephropathy leading to dialysis, decrease in circulation decrease in sores or wound healing which may lead to amputations and increase of heart attack and stroke   Has f/u with Clinical pharmacy   This note has been created with Education officer, environmental. Any transcriptional errors are  unintentional.   Grayce Sessions, NP 10/31/2022, 3:11 PM

## 2022-11-01 NOTE — Progress Notes (Unsigned)
Subjective:  Chief complaint: followup for hospitalization with did a mitis orchitis pyelonephritis with ESBL    HPI  Ask is a 52 year-old Caucasian transgender male living with HIV that has typically been perfectly suppressed on Biktarvy.   Went through a period of nonadherence to her medications although including her antiretrovirals due to her being depressed in the context of frequent medical problems including amputation of the toe more recently pneumonia with where she was hospitalized [facility].  She also was hospitalized recently at [facility]. She came to ED for concerns rt testicular pain, swelling, dysuria and hematuria. Was seen in the UC 5/3 with 2 days h/o dysuria, rt flank pain, rt low back pain and bloody urine. She  was given 1 dose of ceftriaxone and was discharged on Levofloxacin course for concerns of epididymo-orchitis. STD testing was declined as not been sexually active for 3 years. However she returned to ED 5/5 with worsening pain and swelling of thr rt testicle with no improvement in dysuria and hematuria. Pain in the testicle radiating to the lower abdomen as well as subjective fevers   Ultraslim urine yielded ESBL and she was transitioned to meropenem and completed 14 days of carbapenem therapy.  Yesterday was last day of ertapenem and PICC line is still in place.     Past Medical History:  Diagnosis Date   Anxiety    Asthma    Depression    Diabetes mellitus    Gait abnormality    GERD (gastroesophageal reflux disease)    HIV disease (HCC)    Hypertension    Loss of balance 09/14/2020   MRSA carrier    neck   Perirectal abscess 03/30/2017   Pneumonia    Polyuria 04/27/2015   Poorly controlled diabetes mellitus (HCC) 04/27/2015   Testicular mass 06/20/2016   Transgender 04/27/2015    Past Surgical History:  Procedure Laterality Date   APPENDECTOMY     TRANSFORAMINAL LUMBAR INTERBODY FUSION (TLIF) WITH PEDICLE SCREW FIXATION 3 LEVEL  N/A 01/09/2019   Procedure: TRANSFORAMINAL LUMBAR INTERBODY FUSION (TLIF) WITH PEDICLE SCREW FIXATION 3 LEVEL, POSTERIOR SPINAL FUSION;  Surgeon: Venetia Night, MD;  Location: [facility] ORS;  Service: Neurosurgery;  Laterality: N/A;    Family History  Problem Relation Age of Onset   Other Mother        unsure of history   Heart failure Father    Diabetes Father       Social History   Socioeconomic History   Marital status: Single    Spouse name: Not on file   Number of children: 0   Years of education: 11th   Highest education level: 12th grade  Occupational History   Occupation: Disabled   Tobacco Use   Smoking status: Every Day    Packs/day: 0.10    Years: 27.00    Additional pack years: 0.00    Total pack years: 2.70    Types: Cigarettes    Last attempt to quit: 07/19/2018    Years since quitting: 4.2   Smokeless tobacco: Never   Tobacco comments:    started back yesterday  Vaping Use   Vaping Use: Never used  Substance and Sexual Activity   Alcohol use: No    Alcohol/week: 0.0 standard drinks of alcohol   Drug use: Not Currently    Types: Marijuana    Comment: 1-2 times per month   Sexual activity: Yes    Partners: Male    Comment: pt. given condoms  Other Topics Concern   Not on file  Social History Narrative   Drinks 64 ounces of Diet Pepper per day.    Left-handed.   Lives with a friend.   Social Determinants of Health   Financial Resource Strain: High Risk (10/31/2022)   Overall Financial Resource Strain (CARDIA)    Difficulty of Paying Living Expenses: Very hard  Food Insecurity: Food Insecurity Present (10/31/2022)   Hunger Vital Sign    Worried About Running Out of Food in the Last Year: Often true    Ran Out of Food in the Last Year: Sometimes true  Transportation Needs: Unmet Transportation Needs (10/31/2022)   PRAPARE - Administrator, Civil Service (Medical): Yes    Lack of Transportation (Non-Medical): Yes  Physical Activity:  Unknown (10/31/2022)   Exercise Vital Sign    Days of Exercise per Week: 0 days    Minutes of Exercise per Session: Not on file  Stress: Stress Concern Present (10/31/2022)   [employer] of Occupational Health - Occupational Stress Questionnaire    Feeling of Stress : Very much  Social Connections: Socially Isolated (10/31/2022)   Social Connection and Isolation Panel [NHANES]    Frequency of Communication with Friends and Family: Once a week    Frequency of Social Gatherings with Friends and Family: Once a week    Attends Religious Services: 1 to 4 times per year    Active Member of [employer] or Organizations: No    Attends Engineer, structural: Not on file    Marital Status: Never married    Allergies  Allergen Reactions   Propoxyphene N-Acetaminophen Other (See Comments)    Stomach cramps     Current Outpatient Medications:    ACCU-CHEK SOFTCLIX LANCETS lancets, 1 each by Other route 3 (three) times daily., Disp: 100 each, Rfl: 5   acetaminophen (TYLENOL) 325 MG tablet, Take 2 tablets (650 mg total) by mouth every 6 (six) hours as needed for moderate pain., Disp: 30 tablet, Rfl: 0   bictegravir-emtricitabine-tenofovir AF (BIKTARVY) 50-200-25 MG TABS tablet, Take 1 tablet by mouth daily., Disp: 30 tablet, Rfl: 0   blood glucose meter kit and supplies, Dispense based on patient and insurance preference. Use up to four times daily as directed. (FOR ICD-10 E10.9, E11.9)., Disp: 1 each, Rfl: 0   docusate sodium (COLACE CLEAR) 50 MG capsule, Take 1 capsule (50 mg total) by mouth 2 times daily. (Patient not taking: Reported on 07/20/2022), Disp: 10 capsule, Rfl: 0   Dulaglutide (TRULICITY) 1.5 MG/0.5ML SOPN, Inject 1.5 mg into the skin once a week., Disp: 2 mL, Rfl: 3   ertapenem (INVANZ) IVPB, Inject 1 g into the vein daily for 12 days. Indication:  ESBL E.coli epididymitis, orchitis  First Dose: Yes Last Day of Therapy:  11/01/22 Labs - Once weekly:  CBC/D and BMP, ESR and CRP  Method of administration: Mini-Bag Plus / Gravity Pull PICC/midline at the completion of IV antibiotics Method of administration may be changed at the discretion of home infusion pharmacist based upon assessment of the patient and/or caregiver's ability to self-administer the medication ordered., Disp: 12 Units, Rfl: 0   escitalopram (LEXAPRO) 20 MG tablet, TAKE 1 TABLET BY MOUTH AT BEDTIME, Disp: 90 tablet, Rfl: 1   estradiol (ESTRACE) 2 MG tablet, Take 1 tablet (2 mg total) by mouth daily., Disp: 30 tablet, Rfl: 11   glipiZIDE (GLUCOTROL) 10 MG tablet, TAKE 1 TABLET BY MOUTH 2 TIMES DAILY BEFORE MEALS, Disp: 180 tablet,  Rfl: 1   Lancet Devices (ONETOUCH DELICA PLUS LANCING) MISC, Use as directed, Disp: 1 each, Rfl: 0   metFORMIN (GLUCOPHAGE) 1000 MG tablet, Take 1 tablet (1,000 mg total) by mouth 2 (two) times daily with a meal., Disp: 180 tablet, Rfl: 1   Multiple Vitamin (MULTIVITAMIN WITH MINERALS) TABS tablet, Take 1 tablet by mouth daily., Disp: 90 tablet, Rfl: 0   oxyCODONE (OXY IR/ROXICODONE) 5 MG immediate release tablet, Take 1 tablet (5 mg) by mouth every 4 hours as needed for severe pain., Disp: 30 tablet, Rfl: 0   PROAIR HFA 108 (90 Base) MCG/ACT inhaler, INHALE 2 PUFFS BY MOUTH INTO LUNGS EVERY 6 HOURS AS NEEDED FOR WHEEZING OR SHORTNESS OF BREATH, Disp: 8.5 g, Rfl: 1   rosuvastatin (CRESTOR) 20 MG tablet, Take 1 tablet (20 mg total) by mouth daily., Disp: 90 tablet, Rfl: 1   spironolactone (ALDACTONE) 100 MG tablet, Take 1 tablet by mouth daily., Disp: , Rfl:    Review of Systems  Constitutional:  Negative for activity change, appetite change, chills, diaphoresis, fatigue, fever and unexpected weight change.  HENT:  Negative for congestion, rhinorrhea, sinus pressure, sneezing, sore throat and trouble swallowing.   Eyes:  Negative for photophobia and visual disturbance.  Respiratory:  Negative for cough, chest tightness, shortness of breath, wheezing and stridor.   Cardiovascular:   Negative for chest pain, palpitations and leg swelling.  Gastrointestinal:  Negative for abdominal distention, abdominal pain, anal bleeding, blood in stool, constipation, diarrhea, nausea and vomiting.  Genitourinary:  Negative for difficulty urinating, dysuria, flank pain and hematuria.  Musculoskeletal:  Negative for arthralgias, back pain, gait problem, joint swelling and myalgias.  Skin:  Negative for color change, pallor, rash and wound.  Neurological:  Negative for dizziness, tremors, weakness, light-headedness and headaches.  Hematological:  Negative for adenopathy. Does not bruise/bleed easily.  Psychiatric/Behavioral:  Positive for dysphoric mood. Negative for agitation, behavioral problems, confusion, decreased concentration, sleep disturbance and suicidal ideas.        Objective:   Physical Exam Constitutional:      General: She is not in acute distress.    Appearance: Normal appearance. She is well-developed. She is not ill-appearing or diaphoretic.  HENT:     Head: Normocephalic and atraumatic.     Right Ear: Hearing and external ear normal.     Left Ear: Hearing and external ear normal.     Nose: No nasal deformity or rhinorrhea.  Eyes:     General: No scleral icterus.    Conjunctiva/sclera: Conjunctivae normal.     Right eye: Right conjunctiva is not injected.     Left eye: Left conjunctiva is not injected.     Pupils: Pupils are equal, round, and reactive to light.  Neck:     Vascular: No JVD.  Cardiovascular:     Rate and Rhythm: Normal rate and regular rhythm.     Heart sounds: Normal heart sounds, S1 normal and S2 normal. No murmur heard.    No friction rub.  Abdominal:     General: Bowel sounds are normal. There is no distension.     Palpations: Abdomen is soft.     Tenderness: There is no abdominal tenderness.  Musculoskeletal:        General: Normal range of motion.     Right shoulder: Normal.     Left shoulder: Normal.     Cervical back: Normal range  of motion and neck supple.     Right hip: Normal.  Left hip: Normal.     Right knee: Normal.     Left knee: Normal.  Lymphadenopathy:     Head:     Right side of head: No submandibular, preauricular or posterior auricular adenopathy.     Left side of head: No submandibular, preauricular or posterior auricular adenopathy.     Cervical: No cervical adenopathy.     Right cervical: No superficial or deep cervical adenopathy.    Left cervical: No superficial or deep cervical adenopathy.  Skin:    General: Skin is warm and dry.     Coloration: Skin is not pale.     Findings: No abrasion, bruising, ecchymosis, erythema, lesion or rash.     Nails: There is no clubbing.  Neurological:     Mental Status: She is alert and oriented to person, place, and time.     Sensory: No sensory deficit.     Coordination: Coordination normal.     Gait: Gait normal.  Psychiatric:        Attention and Perception: She is attentive.        Mood and Affect: Mood normal.        Speech: Speech normal.        Behavior: Behavior normal. Behavior is cooperative.        Thought Content: Thought content normal.        Judgment: Judgment normal.          Assessment & Plan:    HIV disease:  Reviewed [name]'s most recent viral load which was 30 copies in the hospital.  I will recheck a CD4 count today.  CBC and CMP were checked in the hospital also check a syphilis test.  Complicated urinary tract infection with epididymitis orchitis with ESBL: She is completed treatment.  Depression: not in counseling. She is on lexapro  DM: Hemoglobin A1c being checked  Hyperlipidemia Crestor be continued and lipid panel checked  I have personally spent 42 minutes involved in face-to-face and non-face-to-face activities for this patient on the day of the visit. Professional time spent includes the following activities: Preparing to see the patient (review of tests), Obtaining and/or reviewing separately obtained  history (admission/discharge record), Performing a medically appropriate examination and/or evaluation , Ordering medications/tests/procedures, referring and communicating with other health care professionals, Documenting clinical information in the EMR, Independently interpreting results (not separately reported), Communicating results to the patient/family/caregiver, Counseling and educating the patient/family/caregiver and Care coordination (not separately reported).     Transgender care   Hyperlipidemia: based on REPRIEVE she needs to be started on statin   Vaccine counseling:

## 2022-11-02 ENCOUNTER — Other Ambulatory Visit: Payer: Self-pay

## 2022-11-02 ENCOUNTER — Ambulatory Visit (INDEPENDENT_AMBULATORY_CARE_PROVIDER_SITE_OTHER): Payer: Medicaid Other | Admitting: Infectious Disease

## 2022-11-02 ENCOUNTER — Other Ambulatory Visit (HOSPITAL_COMMUNITY): Payer: Self-pay

## 2022-11-02 VITALS — BP 106/72 | HR 80 | Temp 97.5°F | Wt 215.0 lb

## 2022-11-02 DIAGNOSIS — B20 Human immunodeficiency virus [HIV] disease: Secondary | ICD-10-CM

## 2022-11-02 DIAGNOSIS — Z789 Other specified health status: Secondary | ICD-10-CM | POA: Diagnosis not present

## 2022-11-02 DIAGNOSIS — N3001 Acute cystitis with hematuria: Secondary | ICD-10-CM | POA: Diagnosis not present

## 2022-11-02 DIAGNOSIS — I10 Essential (primary) hypertension: Secondary | ICD-10-CM | POA: Diagnosis not present

## 2022-11-02 DIAGNOSIS — N453 Epididymo-orchitis: Secondary | ICD-10-CM | POA: Diagnosis not present

## 2022-11-02 DIAGNOSIS — E1165 Type 2 diabetes mellitus with hyperglycemia: Secondary | ICD-10-CM

## 2022-11-02 DIAGNOSIS — E1142 Type 2 diabetes mellitus with diabetic polyneuropathy: Secondary | ICD-10-CM

## 2022-11-02 LAB — LIPID PANEL
Cholesterol: 146 mg/dL (ref <200)
LDL Cholesterol (Calc): 78 mg/dL (calc)
Non-HDL Cholesterol (Calc): 105 mg/dL (calc) (ref <130)
Triglycerides: 168 mg/dL — ABNORMAL HIGH (ref <150)

## 2022-11-02 MED ORDER — BIKTARVY 50-200-25 MG PO TABS
1.0000 | ORAL_TABLET | Freq: Every day | ORAL | 11 refills | Status: DC
Start: 2022-11-02 — End: 2023-02-02
  Filled 2022-11-02 – 2022-12-08 (×2): qty 30, 30d supply, fill #0
  Filled 2023-01-17: qty 30, 30d supply, fill #1

## 2022-11-02 NOTE — Progress Notes (Signed)
Labs drawn via midline per Dr. [name]. Line flushed with 10 mL normal saline and clamped. Patient tolerated procedure well.   Midline Removal    midline length & location:  8 cm right basilic  Removed per verbal order from: Dr. [name]  Blood thinners:  none Platelet count:  482 ([facility] 10/26/22)  Site assessment: Dressing clean and dry. Extremity warm and dry with palpable radial pulse. No drainage or swelling present at insertion site. Slight erythema around insertion site, approximately 0.3 cm. Patient advised to monitor and call clinic if redness worsens or if she develops drainage, streaking, or fevers.   Pre-removal vital signs:  BP:  132/84 HR:  106 SpO2:  97%  Insertion site positioned below level of heart. No sutures present. Insertion site cleaned with CHG, catheter removed and petroleum dressing applied. Tip intact. Pressure held until hemostasis achieved.    Length of catheter removed:  8 cm   Provided patient with after care instructions and precautions print out (via [employer] Clinical Key). Reviewed this information with patient.   Patient verbalized understanding and agreement, all questions answered. Patient tolerated procedure well and remained in clinic under the care of RN 30 minutes post removal.  Post-observation vital signs:  BP:  106/72 HR:  80 SpO2:  97%  Notified Courtney with [facility] and [facility] pharmacy team of removal.   Sandie Ano, RN

## 2022-11-03 ENCOUNTER — Ambulatory Visit: Payer: Medicaid Other | Attending: Family Medicine | Admitting: Pharmacist

## 2022-11-03 ENCOUNTER — Other Ambulatory Visit: Payer: Self-pay

## 2022-11-03 DIAGNOSIS — Z794 Long term (current) use of insulin: Secondary | ICD-10-CM

## 2022-11-03 DIAGNOSIS — E1142 Type 2 diabetes mellitus with diabetic polyneuropathy: Secondary | ICD-10-CM | POA: Diagnosis not present

## 2022-11-03 DIAGNOSIS — E119 Type 2 diabetes mellitus without complications: Secondary | ICD-10-CM

## 2022-11-03 DIAGNOSIS — Z7985 Long-term (current) use of injectable non-insulin antidiabetic drugs: Secondary | ICD-10-CM | POA: Diagnosis not present

## 2022-11-03 DIAGNOSIS — Z760 Encounter for issue of repeat prescription: Secondary | ICD-10-CM

## 2022-11-03 LAB — POCT GLYCOSYLATED HEMOGLOBIN (HGB A1C): HbA1c, POC (controlled diabetic range): 7 % (ref 0.0–7.0)

## 2022-11-03 LAB — HEMOGLOBIN A1C
Hgb A1c MFr Bld: 7.5 % of total Hgb — ABNORMAL HIGH (ref <5.7)
Mean Plasma Glucose: 169 mg/dL
eAG (mmol/L): 9.3 mmol/L

## 2022-11-03 LAB — LIPID PANEL
HDL: 41 mg/dL (ref >=40)
Total CHOL/HDL Ratio: 3.6 (calc) (ref <5.0)

## 2022-11-03 LAB — RPR: RPR Ser Ql: NONREACTIVE

## 2022-11-03 LAB — T-HELPER CELLS (CD4) COUNT (NOT AT ARMC)
Absolute CD4: 1721 cells/uL (ref 490–1740)
CD4 T Helper %: 47 % (ref 30–61)
Total lymphocyte count: 3640 cells/uL (ref 850–3900)

## 2022-11-03 MED ORDER — METFORMIN HCL 1000 MG PO TABS
1000.0000 mg | ORAL_TABLET | Freq: Two times a day (BID) | ORAL | 1 refills | Status: DC
  Filled 2022-11-03 – 2022-11-23 (×2): qty 60, 30d supply, fill #0
  Filled 2023-01-30: qty 60, 30d supply, fill #1
  Filled 2023-04-25: qty 60, 30d supply, fill #2
  Filled 2023-05-16: qty 60, 30d supply, fill #3

## 2022-11-03 MED ORDER — GLIPIZIDE 10 MG PO TABS
10.0000 mg | ORAL_TABLET | Freq: Two times a day (BID) | ORAL | 1 refills | Status: DC
Start: 2022-11-03 — End: 2023-02-03
  Filled 2022-11-03 – 2022-11-23 (×2): qty 60, 30d supply, fill #0
  Filled 2023-01-30: qty 60, 30d supply, fill #1

## 2022-11-03 MED ORDER — TRULICITY 1.5 MG/0.5ML ~~LOC~~ SOAJ
1.5000 mg | SUBCUTANEOUS | 3 refills | Status: DC
  Filled 2022-11-03 – 2022-12-08 (×2): qty 2, 28d supply, fill #0
  Filled 2023-01-17: qty 2, 28d supply, fill #1

## 2022-11-03 NOTE — Progress Notes (Signed)
    S:     No chief complaint on file.  52 y.o. adult who presents for diabetes evaluation, education, and management in the context of the LIBERATE Study.  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  Patient was referred and last seen by Primary Care Provider, Gwinda Passe, on 10/31/2022.   Today, patient arrives in good spirits and presents without any assistance. We saw her for LIBERATE enrollment in Feb.   Family/Social History:  -Fhx: HF, DM -Tobacco: former; quit in 2020 -Alcohol: denies  Current diabetes medications include: glipizide 10 mg BID, metformin 1000mg  BID, Trulicity 1.5 mg weekly  Patient reports adherence to taking all medications as prescribed. .  Insurance coverage: [place] [employer]  Patient denies hypoglycemic events.  Patient denies nocturia (nighttime urination).  Patient denies neuropathy (nerve pain). Patient denies visual changes. Patient reports self foot exams.   Patient reported dietary habits:  -Endorses compliance with a diabetic diet   Patient-reported exercise habits: none reported   O:   ROS  Physical Exam   Lab Results  Component Value Date   HGBA1C 7.0 11/03/2022   POC A1c Today: 7.0  There were no vitals filed for this visit.   Lipid Panel     Component Value Date/Time   CHOL 146 11/02/2022 0943   CHOL 144 06/30/2022 1419   TRIG 168 (H) 11/02/2022 0943   HDL 41 11/02/2022 0943   HDL 29 (L) 06/30/2022 1419   CHOLHDL 3.6 11/02/2022 0943   VLDL 47 (H) 11/01/2017 0334   LDLCALC 78 11/02/2022 0943    Clinical Atherosclerotic Cardiovascular Disease (ASCVD): Yes  The ASCVD Risk score (Arnett DK, et al., 2019) failed to calculate for the following reasons:   The patient has a prior MI or stroke diagnosis   Patient is participating in a Managed Medicaid Plan:  Yes    A/P:  LIBERATE Study:  - 8  sensors provided for a 3 month supply. Educated to contact the office if the sensor falls off early and replacements are needed before their next [employer].   Diabetes longstanding currently controlled with A1c of 7 today. Patient is able to verbalize appropriate hypoglycemia management plan. Medication adherence appears appropriate. -Continued current regimen -Extensively discussed pathophysiology of diabetes, recommended lifestyle interventions, dietary effects on blood sugar control.  -Counseled on s/sx of and management of hypoglycemia.  -Next A1c anticipated 01/2023.   Written patient instructions provided. Patient verbalized understanding of treatment plan.  Total time in face to face counseling 30 minutes.    Follow-up:  Pharmacist in 1.5 months. LIBERATE follow-up #2 in August.  Butch Penny, PharmD, P[place]y [place], CPP Clinical Pharmacist [facility] [phone]

## 2022-11-04 ENCOUNTER — Other Ambulatory Visit: Payer: Self-pay

## 2022-11-09 ENCOUNTER — Other Ambulatory Visit: Payer: Self-pay

## 2022-11-12 DIAGNOSIS — Z419 Encounter for procedure for purposes other than remedying health state, unspecified: Secondary | ICD-10-CM | POA: Diagnosis not present

## 2022-11-15 ENCOUNTER — Encounter: Payer: Self-pay | Admitting: Urology

## 2022-11-15 ENCOUNTER — Ambulatory Visit (INDEPENDENT_AMBULATORY_CARE_PROVIDER_SITE_OTHER): Payer: Medicaid Other | Admitting: Urology

## 2022-11-15 ENCOUNTER — Other Ambulatory Visit: Payer: Self-pay

## 2022-11-15 VITALS — BP 117/75 | HR 94 | Ht 71.0 in | Wt 208.0 lb

## 2022-11-15 DIAGNOSIS — N453 Epididymo-orchitis: Secondary | ICD-10-CM | POA: Diagnosis not present

## 2022-11-15 DIAGNOSIS — Z789 Other specified health status: Secondary | ICD-10-CM

## 2022-11-15 DIAGNOSIS — N5082 Scrotal pain: Secondary | ICD-10-CM

## 2022-11-15 LAB — URINALYSIS, ROUTINE W REFLEX MICROSCOPIC
Bilirubin, UA: NEGATIVE
Ketones, UA: NEGATIVE
Leukocytes,UA: NEGATIVE
Nitrite, UA: NEGATIVE
RBC, UA: NEGATIVE
Specific Gravity, UA: 1.03 (ref 1.005–1.030)
Urobilinogen, Ur: 0.2 mg/dL (ref 0.2–1.0)
pH, UA: 5.5 (ref 5.0–7.5)

## 2022-11-15 LAB — MICROSCOPIC EXAMINATION
Cast Type: NONE SEEN
Casts: NONE SEEN /lpf
Epithelial Cells (non renal): >10 /hpf — AB (ref 0–10)
Renal Epithel, UA: NONE SEEN /hpf
Trichomonas, UA: NONE SEEN
Yeast, UA: NONE SEEN

## 2022-11-15 MED ORDER — MELOXICAM 7.5 MG PO TABS
7.5000 mg | ORAL_TABLET | Freq: Every day | ORAL | 1 refills | Status: DC
Start: 2022-11-15 — End: 2023-02-23
  Filled 2022-11-15: qty 14, 14d supply, fill #0

## 2022-11-15 NOTE — Progress Notes (Signed)
Assessment: 1. Epididymo-orchitis   2. Scrotal pain     Plan: I personally reviewed the patient's chart including provider notes, lab and imaging results. I think that the current symptoms are likely due to residual inflammation from the recent episode of epididymoorchitis. Scrotal U/S to evaluate ongoing right scrotal pain Meloxicam 7.5 mg daily.  Rx sent. Return to office in 2 weeks for reevaluation. Referral to Dr. Guy Sandifer at [facility] for evaluation for gender affirming surgery   Chief Complaint:  Chief Complaint  Patient presents with   epididymitis    History of Present Illness:  [name] is a 52 y.o. adult who is seen in consultation from [name] Sessions, NP for evaluation of right epididymoorchitis.  She is a male to male transgender patient who presented to [facility] on 10/17/2022 with right testicular pain, dysuria, right low back pain, and hematuria.  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  She has a history of recurrent infections.  She reports pain in the scrotal area occurring monthly and lasting for approximately 3 days. Scrotal ultrasound from 10/17/2022 showed evidence of thickening and increased vascularity of the right epididymis consistent with right epididymal orchitis as well as a complex moderate right hydrocele.  Urine culture grew multidrug-resistant E. coli.  IV meropenem was given as an outpatient.  Repeat scrotal ultrasound from 10/20/2022 showed interval resolution of the right epididymal orchitis.  A right hydrcele with debris and thick septations noted.  She completed the meropenem on 11/01/2022.  She reports return of right-sided scrotal discomfort after completing the antibiotics.  She has not had any increased scrotal swelling.  No dysuria or gross hematuria.  [name] is interested in pursuing gender affirming surgery beginning with bilateral orchiectomy.  Past Medical History:  Past Medical History:  Diagnosis Date    Anxiety    Asthma    Depression    Diabetes mellitus    Gait abnormality    GERD (gastroesophageal reflux disease)    HIV disease (HCC)    Hypertension    Loss of balance 09/14/2020   MRSA carrier    neck   Perirectal abscess 03/30/2017   Pneumonia    Polyuria 04/27/2015   Poorly controlled diabetes mellitus (HCC) 04/27/2015   Testicular mass 06/20/2016   Transgender 04/27/2015    Past Surgical History:  Past Surgical History:  Procedure Laterality Date   APPENDECTOMY     TRANSFORAMINAL LUMBAR INTERBODY FUSION (TLIF) WITH PEDICLE SCREW FIXATION 3 LEVEL N/A 01/09/2019   Procedure: TRANSFORAMINAL LUMBAR INTERBODY FUSION (TLIF) WITH PEDICLE SCREW FIXATION 3 LEVEL, POSTERIOR SPINAL FUSION;  Surgeon: Venetia Night, MD;  Location: [facility] ORS;  Service: Neurosurgery;  Laterality: N/A;    Allergies:  Allergies  Allergen Reactions   Propoxyphene N-Acetaminophen Other (See Comments)    Stomach cramps    Family History:  Family History  Problem Relation Age of Onset   Other Mother        unsure of history   Heart failure Father    Diabetes Father     Social History:  Social History   Tobacco Use   Smoking status: Every Day    Packs/day: 0.10    Years: 27.00    Additional pack years: 0.00    Total pack years: 2.70    Types: Cigarettes    Last attempt to quit: 07/19/2018    Years since quitting: 4.3   Smokeless tobacco: Never   Tobacco comments:    started back yesterday  Vaping Use   Vaping Use: Never used  Substance Use Topics   Alcohol use: No    Alcohol/week: 0.0 standard drinks of alcohol   Drug use: Not Currently    Types: Marijuana    Comment: 1-2 times per month    Review of symptoms:  Constitutional:  Negative for unexplained weight loss, night sweats, fever, chills ENT:  Negative for nose bleeds, sinus pain, painful swallowing CV:  Negative for chest pain, shortness of breath, exercise intolerance, palpitations, loss of consciousness Resp:  Negative  for cough, wheezing, shortness of breath GI:  Negative for nausea, vomiting, diarrhea, bloody stools GU:  Positives noted in HPI; otherwise negative for gross hematuria, dysuria, urinary incontinence Neuro:  Negative for seizures, poor balance, limb weakness, slurred speech Psych:  Negative for lack of energy, depression, anxiety Endocrine:  Negative for polydipsia, polyuria, symptoms of hypoglycemia (dizziness, hunger, sweating) Hematologic:  Negative for anemia, purpura, petechia, prolonged or excessive bleeding, use of anticoagulants  Allergic:  Negative for difficulty breathing or choking as a result of exposure to anything; no shellfish allergy; no allergic response (rash/itch) to materials, foods  Physical exam: BP 117/75   Pulse 94   Ht 5\' 11"  (1.803 m)   Wt 208 lb (94.3 kg)   BMI 29.01 kg/m  GENERAL APPEARANCE:  Well appearing, well developed, well nourished, NAD HEENT: Atraumatic, Normocephalic, oropharynx clear. NECK: Supple without lymphadenopathy or thyromegaly. LUNGS: Clear to auscultation bilaterally. HEART: Regular Rate and Rhythm without murmurs, gallops, or rubs. ABDOMEN: Soft, non-tender, No Masses. EXTREMITIES: Moves all extremities well.  Without clubbing, cyanosis, or edema. NEUROLOGIC:  Alert and oriented x 3, normal gait, CN II-XII grossly intact.  MENTAL STATUS:  Appropriate. BACK:  Non-tender to palpation.  No CVAT SKIN:  Warm, dry and intact.   GU: Penis:  circumcised Meatus: Normal Scrotum: right scrotal enlargement without erythema; right hydrocele Testis: left normal, right minimally tender Epididymis: tenderness: right   Results: U/A:  0-5 WBC, 0-2 RBC, mod bacteria

## 2022-11-16 ENCOUNTER — Telehealth (HOSPITAL_BASED_OUTPATIENT_CLINIC_OR_DEPARTMENT_OTHER): Payer: Self-pay

## 2022-11-21 ENCOUNTER — Other Ambulatory Visit: Payer: Self-pay

## 2022-11-23 ENCOUNTER — Other Ambulatory Visit (HOSPITAL_COMMUNITY): Payer: Self-pay

## 2022-11-23 ENCOUNTER — Other Ambulatory Visit: Payer: Self-pay

## 2022-11-30 ENCOUNTER — Ambulatory Visit: Payer: Medicaid Other | Admitting: Urology

## 2022-11-30 NOTE — Progress Notes (Deleted)
Assessment: 1. Epididymo-orchitis   2. Scrotal pain     Plan: I think that the current symptoms are likely due to residual inflammation from the recent episode of epididymoorchitis. Scrotal U/S to evaluate ongoing right scrotal pain Meloxicam 7.5 mg daily.  Rx sent. Return to office in 2 weeks for reevaluation. Referral to Dr. Guy [name] at [facility] for evaluation for gender affirming surgery   Chief Complaint:  No chief complaint on file.   History of Present Illness:  [name] is a 52 y.o. adult who is seen for further evaluation of right epididymoorchitis.  She is a male to male transgender patient who presented to [facility] on 10/17/2022 with right testicular pain, dysuria, right low back pain, and hematuria.  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  She has a history of recurrent infections.  She reports pain in the scrotal area occurring monthly and lasting for approximately 3 days. Scrotal ultrasound from 10/17/2022 showed evidence of thickening and increased vascularity of the right epididymis consistent with right epididymal orchitis as well as a complex moderate right hydrocele.  Urine culture grew multidrug-resistant E. coli.  IV meropenem was given as an outpatient.  Repeat scrotal ultrasound from 10/20/2022 showed interval resolution of the right epididymal orchitis.  A right hydrcele with debris and thick septations noted.  She completed the meropenem on 11/01/2022.  She reports return of right-sided scrotal discomfort after completing the antibiotics.  She has not had any increased scrotal swelling.  No dysuria or gross hematuria.  [name] is interested in pursuing gender affirming surgery beginning with bilateral orchiectomy.  Past Medical History:  Past Medical History:  Diagnosis Date   Anxiety    Asthma    Depression    Diabetes mellitus    Gait abnormality    GERD (gastroesophageal reflux disease)    HIV disease (HCC)     Hypertension    Loss of balance 09/14/2020   MRSA carrier    neck   Perirectal abscess 03/30/2017   Pneumonia    Polyuria 04/27/2015   Poorly controlled diabetes mellitus (HCC) 04/27/2015   Testicular mass 06/20/2016   Transgender 04/27/2015    Past Surgical History:  Past Surgical History:  Procedure Laterality Date   APPENDECTOMY     TRANSFORAMINAL LUMBAR INTERBODY FUSION (TLIF) WITH PEDICLE SCREW FIXATION 3 LEVEL N/A 01/09/2019   Procedure: TRANSFORAMINAL LUMBAR INTERBODY FUSION (TLIF) WITH PEDICLE SCREW FIXATION 3 LEVEL, POSTERIOR SPINAL FUSION;  Surgeon: Venetia Night, MD;  Location: [facility] ORS;  Service: Neurosurgery;  Laterality: N/A;    Allergies:  Allergies  Allergen Reactions   Propoxyphene N-Acetaminophen Other (See Comments)    Stomach cramps    Family History:  Family History  Problem Relation Age of Onset   Other Mother        unsure of history   Heart failure Father    Diabetes Father     Social History:  Social History   Tobacco Use   Smoking status: Every Day    Packs/day: 0.10    Years: 27.00    Additional pack years: 0.00    Total pack years: 2.70    Types: Cigarettes    Last attempt to quit: 07/19/2018    Years since quitting: 4.3   Smokeless tobacco: Never   Tobacco comments:    started back yesterday  Vaping Use   Vaping Use: Never used  Substance Use Topics   Alcohol use: No    Alcohol/week: 0.0 standard  drinks of alcohol   Drug use: Not Currently    Types: Marijuana    Comment: 1-2 times per month    ROS: Constitutional:  Negative for fever, chills, weight loss CV: Negative for chest pain, previous MI, hypertension Respiratory:  Negative for shortness of breath, wheezing, sleep apnea, frequent cough GI:  Negative for nausea, vomiting, bloody stool, GERD  Physical exam: There were no vitals taken for this visit. GENERAL APPEARANCE:  Well appearing, well developed, well nourished, NAD HEENT:  Atraumatic, normocephalic, oropharynx  clear NECK:  Supple without lymphadenopathy or thyromegaly ABDOMEN:  Soft, non-tender, no masses EXTREMITIES:  Moves all extremities well, without clubbing, cyanosis, or edema NEUROLOGIC:  Alert and oriented x 3, normal gait, CN II-XII grossly intact MENTAL STATUS:  appropriate BACK:  Non-tender to palpation, No CVAT SKIN:  Warm, dry, and intact   Results: U/A:

## 2022-12-01 ENCOUNTER — Ambulatory Visit: Payer: Medicaid Other | Admitting: Pharmacist

## 2022-12-08 ENCOUNTER — Other Ambulatory Visit (HOSPITAL_COMMUNITY): Payer: Self-pay

## 2022-12-08 ENCOUNTER — Other Ambulatory Visit: Payer: Self-pay

## 2022-12-12 DIAGNOSIS — Z419 Encounter for procedure for purposes other than remedying health state, unspecified: Secondary | ICD-10-CM | POA: Diagnosis not present

## 2023-01-03 ENCOUNTER — Other Ambulatory Visit (HOSPITAL_COMMUNITY): Payer: Self-pay

## 2023-01-05 ENCOUNTER — Other Ambulatory Visit (HOSPITAL_COMMUNITY): Payer: Self-pay

## 2023-01-09 ENCOUNTER — Encounter (HOSPITAL_COMMUNITY): Payer: Self-pay

## 2023-01-09 ENCOUNTER — Other Ambulatory Visit (HOSPITAL_COMMUNITY): Payer: Self-pay

## 2023-01-12 DIAGNOSIS — Z419 Encounter for procedure for purposes other than remedying health state, unspecified: Secondary | ICD-10-CM | POA: Diagnosis not present

## 2023-01-17 ENCOUNTER — Other Ambulatory Visit (HOSPITAL_COMMUNITY): Payer: Self-pay

## 2023-01-18 ENCOUNTER — Encounter (INDEPENDENT_AMBULATORY_CARE_PROVIDER_SITE_OTHER): Payer: Self-pay | Admitting: Primary Care

## 2023-01-18 ENCOUNTER — Encounter (INDEPENDENT_AMBULATORY_CARE_PROVIDER_SITE_OTHER): Payer: Self-pay

## 2023-01-18 ENCOUNTER — Other Ambulatory Visit: Payer: Self-pay

## 2023-01-18 ENCOUNTER — Ambulatory Visit (INDEPENDENT_AMBULATORY_CARE_PROVIDER_SITE_OTHER): Payer: 59 | Admitting: Primary Care

## 2023-01-18 VITALS — BP 137/89 | HR 108 | Resp 16 | Wt 217.2 lb

## 2023-01-18 DIAGNOSIS — E119 Type 2 diabetes mellitus without complications: Secondary | ICD-10-CM

## 2023-01-18 DIAGNOSIS — Z794 Long term (current) use of insulin: Secondary | ICD-10-CM | POA: Diagnosis not present

## 2023-01-18 DIAGNOSIS — F418 Other specified anxiety disorders: Secondary | ICD-10-CM | POA: Diagnosis not present

## 2023-01-18 DIAGNOSIS — Z1211 Encounter for screening for malignant neoplasm of colon: Secondary | ICD-10-CM

## 2023-01-26 ENCOUNTER — Encounter (INDEPENDENT_AMBULATORY_CARE_PROVIDER_SITE_OTHER): Payer: Self-pay | Admitting: Primary Care

## 2023-01-26 NOTE — Progress Notes (Signed)
[facility]  [name], is a 52 y.o. adult  OZH:[id number]  GEX:[id number]  DOB - August 29, 1970        Subjective:   [name]) is a 52 y.o. adult who identifies as a woman here today for a follow up visit for T2D but too early for testing. Denies polyuria, polydipsia, polyphasia or vision changes.  Does not check blood sugars at home. Bp is slightly elevated. Depression and anxiety up feels like a burden no disability check and will bring in physician documentation with permeant restrictions. " Always something"   Patient has No headache, No chest pain, No abdominal pain - No Nausea, No new weakness tingling or numbness, No Cough - shortness of breath  No problems updated.  Allergies  Allergen Reactions   Propoxyphene N-Acetaminophen Other (See Comments)    Stomach cramps    Past Medical History:  Diagnosis Date   Anxiety    Asthma    Depression    Diabetes mellitus    Gait abnormality    GERD (gastroesophageal reflux disease)    HIV disease (HCC)    Hypertension    Loss of balance 09/14/2020   MRSA carrier    neck   Perirectal abscess 03/30/2017   Pneumonia    Polyuria 04/27/2015   Poorly controlled diabetes mellitus (HCC) 04/27/2015   Testicular mass 06/20/2016   Transgender 04/27/2015    Current Outpatient Medications on File Prior to Visit  Medication Sig Dispense Refill   ACCU-CHEK SOFTCLIX LANCETS lancets 1 each by Other route 3 (three) times daily. 100 each 5   bictegravir-emtricitabine-tenofovir AF (BIKTARVY) 50-200-25 MG TABS tablet Take 1 tablet by mouth daily. 30 tablet 11   blood glucose meter kit and supplies Dispense based on patient and insurance preference. Use up to four times daily as directed. (FOR ICD-10 E10.9, E11.9). 1 each 0   docusate sodium (COLACE CLEAR) 50 MG capsule Take 1 capsule (50 mg total) by mouth 2 times daily. 10 capsule 0   Dulaglutide (TRULICITY) 1.5 MG/0.5ML SOPN Inject 1.5 mg into the skin once a  week. 2 mL 3   escitalopram (LEXAPRO) 20 MG tablet TAKE 1 TABLET BY MOUTH AT BEDTIME 90 tablet 1   estradiol (ESTRACE) 2 MG tablet Take 1 tablet (2 mg total) by mouth daily. 30 tablet 11   glipiZIDE (GLUCOTROL) 10 MG tablet Take 1 tablet (10 mg total) by mouth 2 (two) times daily before a meal. 180 tablet 1   Lancet Devices (ONETOUCH DELICA PLUS LANCING) MISC Use as directed 1 each 0   meloxicam (MOBIC) 7.5 MG tablet Take 1 tablet (7.5 mg total) by mouth daily. 14 tablet 1   metFORMIN (GLUCOPHAGE) 1000 MG tablet Take 1 tablet (1,000 mg total) by mouth 2 (two) times daily with a meal. 180 tablet 1   oxyCODONE (OXY IR/ROXICODONE) 5 MG immediate release tablet Take 1 tablet (5 mg) by mouth every 4 hours as needed for severe pain. (Patient not taking: Reported on 11/15/2022) 30 tablet 0   PROAIR HFA 108 (90 Base) MCG/ACT inhaler INHALE 2 PUFFS BY MOUTH INTO LUNGS EVERY 6 HOURS AS NEEDED FOR WHEEZING OR SHORTNESS OF BREATH 8.5 g 1   rosuvastatin (CRESTOR) 20 MG tablet Take 1 tablet (20 mg total) by mouth daily. 90 tablet 1   spironolactone (ALDACTONE) 100 MG tablet Take 1 tablet by mouth daily.     No current facility-administered medications on file prior to visit.    Objective:   Vitals:  01/18/23 1112  BP: 137/89  Pulse: (Abnormal) 108  Resp: 16  SpO2: 99%  Weight: 217 lb 3.2 oz (98.5 kg)    Comprehensive ROS Pertinent positive and negative noted in HPI   Exam General appearance : Awake, alert, not in any distress. Speech Clear. Not toxic looking HEENT: Atraumatic and Normocephalic, pupils equally reactive to light and accomodation Neck: Supple, no JVD. No cervical lymphadenopathy.  Chest: Good air entry bilaterally, no added sounds  CVS: S1 S2 regular, no murmurs.  Abdomen: Bowel sounds present, Non tender and not distended with no gaurding, rigidity or rebound. Extremities: B/L Lower Ext shows no edema, both legs are warm to touch Neurology: Awake alert, and oriented X 3, Non  focal Skin: No Rash  Data Review Lab Results  Component Value Date   HGBA1C 7.0 11/03/2022   HGBA1C 7.5 (H) 11/02/2022   HGBA1C 8.7 (A) 08/04/2022    Assessment & Plan  Diagnoses and all orders for this visit:  Type 2 diabetes mellitus without complication, with long-term current use of insulin (HCC) - educated on lifestyle modifications, including but not limited to diet choices and adding exercise to daily routine.   -     Microalbumin / creatinine urine ratio  Depression with anxiety Spoke with clinical nurse manager and will assist food insecurity , roof over his head that she can call her own   Colon cancer screening -     Ambulatory referral to Gastroenterology  Patient have been counseled extensively about nutrition and exercise. Other issues discussed during this visit include: low cholesterol diet, weight control and daily exercise, foot care, annual eye examinations at Ophthalmology, importance of adherence with medications and regular follow-up. We also discussed long term complications of uncontrolled diabetes and hypertension.   No follow-ups on file.  The patient was given clear instructions to go to ER or return to medical center if symptoms don't improve, worsen or new problems develop. The patient verbalized understanding. The patient was told to call to get lab results if they haven't heard anything in the next week.   This note has been created with Education officer, environmental. Any transcriptional errors are unintentional.   Grayce Sessions, NP 01/26/2023, 2:12 PM

## 2023-01-30 ENCOUNTER — Other Ambulatory Visit: Payer: Self-pay

## 2023-02-02 ENCOUNTER — Other Ambulatory Visit: Payer: Self-pay

## 2023-02-02 ENCOUNTER — Other Ambulatory Visit (HOSPITAL_COMMUNITY): Payer: Self-pay

## 2023-02-02 ENCOUNTER — Ambulatory Visit (INDEPENDENT_AMBULATORY_CARE_PROVIDER_SITE_OTHER): Payer: 59 | Admitting: Infectious Disease

## 2023-02-02 ENCOUNTER — Encounter: Payer: Self-pay | Admitting: Infectious Disease

## 2023-02-02 VITALS — BP 144/94 | HR 98 | Temp 98.3°F | Ht 71.0 in | Wt 221.0 lb

## 2023-02-02 DIAGNOSIS — E1165 Type 2 diabetes mellitus with hyperglycemia: Secondary | ICD-10-CM

## 2023-02-02 DIAGNOSIS — E1065 Type 1 diabetes mellitus with hyperglycemia: Secondary | ICD-10-CM

## 2023-02-02 DIAGNOSIS — Z789 Other specified health status: Secondary | ICD-10-CM

## 2023-02-02 DIAGNOSIS — Z5900 Homelessness unspecified: Secondary | ICD-10-CM

## 2023-02-02 DIAGNOSIS — F418 Other specified anxiety disorders: Secondary | ICD-10-CM | POA: Diagnosis not present

## 2023-02-02 DIAGNOSIS — B20 Human immunodeficiency virus [HIV] disease: Secondary | ICD-10-CM

## 2023-02-02 DIAGNOSIS — E785 Hyperlipidemia, unspecified: Secondary | ICD-10-CM

## 2023-02-02 DIAGNOSIS — I10 Essential (primary) hypertension: Secondary | ICD-10-CM

## 2023-02-02 MED ORDER — ROSUVASTATIN CALCIUM 20 MG PO TABS
20.0000 mg | ORAL_TABLET | Freq: Every day | ORAL | 3 refills | Status: DC
Start: 2023-02-02 — End: 2024-01-07
  Filled 2023-02-02: qty 30, 30d supply, fill #0

## 2023-02-02 MED ORDER — ESTRADIOL 2 MG PO TABS
2.0000 mg | ORAL_TABLET | Freq: Every day | ORAL | 11 refills | Status: AC
Start: 2023-02-02 — End: ?
  Filled 2023-02-02: qty 30, 30d supply, fill #0

## 2023-02-02 MED ORDER — BIKTARVY 50-200-25 MG PO TABS
1.0000 | ORAL_TABLET | Freq: Every day | ORAL | 11 refills | Status: DC
  Filled 2023-02-02 – 2023-02-06 (×2): qty 30, 30d supply, fill #0
  Filled 2023-03-02: qty 30, 30d supply, fill #1
  Filled 2023-04-25: qty 30, 30d supply, fill #2
  Filled 2023-05-16 – 2023-05-23 (×3): qty 30, 30d supply, fill #3
  Filled 2023-06-22 – 2023-06-23 (×2): qty 30, 30d supply, fill #4

## 2023-02-02 NOTE — Progress Notes (Signed)
Subjective:  Chief complaint:    HPI  Ask is a 52 year-old Caucasian transgender male living with HIV that has typically been perfectly suppressed on Biktarvy.   [name] has back on estradiol.  She suffered from some significant depressive symptoms. She is currently homeless but sleepign on her sisters couch. She is having to ask her sister for "lots of things" and [name] feels bad about that. She also says her sister has fairly "OCD" compulsive personality re how her house is kept and that this causes stress as well. She has not had active thoughts of suicide. She denies passive SI but does say that she "sometimes feels like things might be better if I just wasn't here"  Continues to be on Lexapro but her PCP is contemplating switch to Paxil.  She asked about Cabenuva and we had an extensive discussion about this   Past Medical History:  Diagnosis Date   Anxiety    Asthma    Depression    Diabetes mellitus    Gait abnormality    GERD (gastroesophageal reflux disease)    HIV disease (HCC)    Hypertension    Loss of balance 09/14/2020   MRSA carrier    neck   Perirectal abscess 03/30/2017   Pneumonia    Polyuria 04/27/2015   Poorly controlled diabetes mellitus (HCC) 04/27/2015   Testicular mass 06/20/2016   Transgender 04/27/2015    Past Surgical History:  Procedure Laterality Date   APPENDECTOMY     TRANSFORAMINAL LUMBAR INTERBODY FUSION (TLIF) WITH PEDICLE SCREW FIXATION 3 LEVEL N/A 01/09/2019   Procedure: TRANSFORAMINAL LUMBAR INTERBODY FUSION (TLIF) WITH PEDICLE SCREW FIXATION 3 LEVEL, POSTERIOR SPINAL FUSION;  Surgeon: Venetia Night, MD;  Location: [facility] ORS;  Service: Neurosurgery;  Laterality: N/A;    Family History  Problem Relation Age of Onset   Other Mother        unsure of history   Heart failure Father    Diabetes Father       Social History   Socioeconomic History   Marital status: Single    Spouse name: Not on file   Number of children: 0    Years of education: 11th   Highest education level: 12th grade  Occupational History   Occupation: Disabled   Tobacco Use   Smoking status: Every Day    Current packs/day: 0.00    Average packs/day: 0.1 packs/day for 27.0 years (2.7 ttl pk-yrs)    Types: Cigarettes    Start date: 07/20/1991    Last attempt to quit: 07/19/2018    Years since quitting: 4.5   Smokeless tobacco: Never   Tobacco comments:    started back yesterday  Vaping Use   Vaping status: Never Used  Substance and Sexual Activity   Alcohol use: No    Alcohol/week: 0.0 standard drinks of alcohol   Drug use: Not Currently    Types: Marijuana    Comment: 1-2 times per month   Sexual activity: Yes    Partners: Male    Comment: pt. given condoms  Other Topics Concern   Not on file  Social History Narrative   Drinks 64 ounces of Diet Pepper per day.    Left-handed.   Lives with a friend.   Social Determinants of Health   Financial Resource Strain: High Risk (10/31/2022)   Overall Financial Resource Strain (CARDIA)    Difficulty of Paying Living Expenses: Very hard  Food Insecurity: Food Insecurity Present (10/31/2022)   Hunger Vital Sign  Worried About Programme researcher, broadcasting/film/video in the Last Year: Often true    Ran Out of Food in the Last Year: Sometimes true  Transportation Needs: Unmet Transportation Needs (10/31/2022)   PRAPARE - Administrator, Civil Service (Medical): Yes    Lack of Transportation (Non-Medical): Yes  Physical Activity: Unknown (10/31/2022)   Exercise Vital Sign    Days of Exercise per Week: 0 days    Minutes of Exercise per Session: Not on file  Stress: Stress Concern Present (10/31/2022)   [employer] of Occupational Health - Occupational Stress Questionnaire    Feeling of Stress : Very much  Social Connections: Socially Isolated (10/31/2022)   Social Connection and Isolation Panel [NHANES]    Frequency of Communication with Friends and Family: Once a week    Frequency of  Social Gatherings with Friends and Family: Once a week    Attends Religious Services: 1 to 4 times per year    Active Member of [employer] or Organizations: No    Attends Engineer, structural: Not on file    Marital Status: Never married    Allergies  Allergen Reactions   Propoxyphene N-Acetaminophen Other (See Comments)    Stomach cramps     Current Outpatient Medications:    ACCU-CHEK SOFTCLIX LANCETS lancets, 1 each by Other route 3 (three) times daily., Disp: 100 each, Rfl: 5   bictegravir-emtricitabine-tenofovir AF (BIKTARVY) 50-200-25 MG TABS tablet, Take 1 tablet by mouth daily., Disp: 30 tablet, Rfl: 11   blood glucose meter kit and supplies, Dispense based on patient and insurance preference. Use up to four times daily as directed. (FOR ICD-10 E10.9, E11.9)., Disp: 1 each, Rfl: 0   Dulaglutide (TRULICITY) 1.5 MG/0.5ML SOPN, Inject 1.5 mg into the skin once a week., Disp: 2 mL, Rfl: 3   escitalopram (LEXAPRO) 20 MG tablet, TAKE 1 TABLET BY MOUTH AT BEDTIME, Disp: 90 tablet, Rfl: 1   estradiol (ESTRACE) 2 MG tablet, Take 1 tablet (2 mg total) by mouth daily., Disp: 30 tablet, Rfl: 11   gabapentin (NEURONTIN) 300 MG capsule, Take by mouth., Disp: , Rfl:    glipiZIDE (GLUCOTROL) 10 MG tablet, Take 1 tablet (10 mg total) by mouth 2 (two) times daily before a meal., Disp: 180 tablet, Rfl: 1   Lancet Devices (ONETOUCH DELICA PLUS LANCING) MISC, Use as directed, Disp: 1 each, Rfl: 0   metFORMIN (GLUCOPHAGE) 1000 MG tablet, Take 1 tablet (1,000 mg total) by mouth 2 (two) times daily with a meal., Disp: 180 tablet, Rfl: 1   spironolactone (ALDACTONE) 100 MG tablet, Take 1 tablet by mouth daily., Disp: , Rfl:    docusate sodium (COLACE CLEAR) 50 MG capsule, Take 1 capsule (50 mg total) by mouth 2 times daily. (Patient not taking: Reported on 02/02/2023), Disp: 10 capsule, Rfl: 0   meloxicam (MOBIC) 7.5 MG tablet, Take 1 tablet (7.5 mg total) by mouth daily. (Patient not taking: Reported  on 02/02/2023), Disp: 14 tablet, Rfl: 1   oxyCODONE (OXY IR/ROXICODONE) 5 MG immediate release tablet, Take 1 tablet (5 mg) by mouth every 4 hours as needed for severe pain. (Patient not taking: Reported on 11/15/2022), Disp: 30 tablet, Rfl: 0   PROAIR HFA 108 (90 Base) MCG/ACT inhaler, INHALE 2 PUFFS BY MOUTH INTO LUNGS EVERY 6 HOURS AS NEEDED FOR WHEEZING OR SHORTNESS OF BREATH, Disp: 8.5 g, Rfl: 1   rosuvastatin (CRESTOR) 20 MG tablet, Take 1 tablet (20 mg total) by mouth daily. (Patient not taking:  Reported on 02/02/2023), Disp: 90 tablet, Rfl: 1   Review of Systems  Constitutional:  Negative for activity change, appetite change, chills, diaphoresis, fatigue, fever and unexpected weight change.  HENT:  Negative for congestion, rhinorrhea, sinus pressure, sneezing, sore throat and trouble swallowing.   Eyes:  Negative for photophobia and visual disturbance.  Respiratory:  Negative for cough, chest tightness, shortness of breath, wheezing and stridor.   Cardiovascular:  Negative for chest pain, palpitations and leg swelling.  Gastrointestinal:  Negative for abdominal distention, abdominal pain, anal bleeding, blood in stool, constipation, diarrhea, nausea and vomiting.  Genitourinary:  Negative for difficulty urinating, dysuria, flank pain and hematuria.  Musculoskeletal:  Negative for arthralgias, back pain, gait problem, joint swelling and myalgias.  Skin:  Negative for color change, pallor, rash and wound.  Neurological:  Negative for dizziness, tremors, weakness and light-headedness.  Hematological:  Negative for adenopathy. Does not bruise/bleed easily.  Psychiatric/Behavioral:  Positive for dysphoric mood. Negative for agitation, behavioral problems, confusion, decreased concentration, self-injury, sleep disturbance and suicidal ideas.        Objective:   Physical Exam Constitutional:      Appearance: She is well-developed.  HENT:     Head: Normocephalic and atraumatic.  Eyes:      Conjunctiva/sclera: Conjunctivae normal.  Cardiovascular:     Rate and Rhythm: Normal rate and regular rhythm.  Pulmonary:     Effort: Pulmonary effort is normal. No respiratory distress.     Breath sounds: No wheezing.  Abdominal:     General: There is no distension.     Palpations: Abdomen is soft.  Musculoskeletal:        General: No tenderness. Normal range of motion.     Cervical back: Normal range of motion and neck supple.  Skin:    General: Skin is warm and dry.     Coloration: Skin is not pale.     Findings: No erythema or rash.  Neurological:     General: No focal deficit present.     Mental Status: She is alert and oriented to person, place, and time.  Psychiatric:        Attention and Perception: Attention normal.        Mood and Affect: Mood is depressed. Affect is tearful.        Speech: Speech normal.        Behavior: Behavior normal.        Thought Content: Thought content normal.        Cognition and Memory: Cognition and memory normal.        Judgment: Judgment normal.          Assessment & Plan:    /HIV disease:  I will add order HIV viral load CD4 count CBC with differential CMP, RPR GC and chlamydia and I will continue  ANTINIO BURTENSHAW, rescription for now.. BUT I am supportive of her switching to [place] if she has no prohibitive R and pharmacy feels she would be a good candidate.  She understands need to make appts within the target window the risk of R if dose is missed and the issue with VF w resistance despite on time injections  Major depression: recommended CBT, medidation and support trying other SSRI or antidepressants  Hyperlipidemia will renew Crestor she says she was told to stop this when she was being treated for her epididymitis.  Transgender: Continue estradiol she was interested in the ACT G study but the BIKTARVY arm is now completely  full.  Homelessness had a met meet with Johaura from [facility]  DM: A1c is down nicely  when last checked  I have personally spent 44  minutes involved in face-to-face and non-face-to-face activities for this patient on the day of the visit. Professional time spent includes the following activities: Preparing to see the patient (review of tests), Obtaining and/or reviewing separately obtained history (admission/discharge record), Performing a medically appropriate examination and/or evaluation , Ordering medications/tests/procedures, referring and communicating with other health care professionals, Documenting clinical information in the EMR, Independently interpreting results (not separately reported), Communicating results to the patient/family/caregiver, Counseling and educating the patient/family/caregiver and Care coordination (not separately reported).

## 2023-02-02 NOTE — Addendum Note (Signed)
Addended by: Juanita Laster on: 02/02/2023 12:16 PM   Modules accepted: Orders

## 2023-02-03 ENCOUNTER — Other Ambulatory Visit: Payer: Self-pay

## 2023-02-03 ENCOUNTER — Telehealth: Payer: Self-pay | Admitting: Pharmacist

## 2023-02-03 ENCOUNTER — Ambulatory Visit: Payer: 59 | Attending: Primary Care | Admitting: Pharmacist

## 2023-02-03 DIAGNOSIS — Z7984 Long term (current) use of oral hypoglycemic drugs: Secondary | ICD-10-CM

## 2023-02-03 DIAGNOSIS — Z7985 Long-term (current) use of injectable non-insulin antidiabetic drugs: Secondary | ICD-10-CM

## 2023-02-03 DIAGNOSIS — E1142 Type 2 diabetes mellitus with diabetic polyneuropathy: Secondary | ICD-10-CM

## 2023-02-03 LAB — POCT GLYCOSYLATED HEMOGLOBIN (HGB A1C): HbA1c, POC (controlled diabetic range): 8 % — AB (ref 0.0–7.0)

## 2023-02-03 LAB — T-HELPER CELLS (CD4) COUNT (NOT AT ARMC)
CD4 % Helper T Cell: 41 % (ref 33–65)
CD4 T Cell Abs: 1398 /uL (ref 400–1790)

## 2023-02-03 MED ORDER — GLIPIZIDE ER 10 MG PO TB24
10.0000 mg | ORAL_TABLET | Freq: Every day | ORAL | 1 refills | Status: DC
Start: 2023-02-03 — End: 2024-01-07
  Filled 2023-02-03 – 2023-02-20 (×2): qty 30, 30d supply, fill #0
  Filled 2023-04-25: qty 30, 30d supply, fill #1
  Filled 2023-05-16: qty 30, 30d supply, fill #2

## 2023-02-03 MED ORDER — FREESTYLE LIBRE 3 SENSOR MISC
6 refills | Status: DC
  Filled 2023-02-03 – 2023-02-20 (×3): qty 2, 28d supply, fill #0
  Filled 2023-05-16: qty 2, 28d supply, fill #1

## 2023-02-03 MED ORDER — TRULICITY 3 MG/0.5ML ~~LOC~~ SOAJ
3.0000 mg | SUBCUTANEOUS | 1 refills | Status: DC
Start: 2023-02-03 — End: 2024-01-07
  Filled 2023-02-03 – 2023-02-20 (×2): qty 2, 28d supply, fill #0
  Filled 2023-04-25: qty 2, 28d supply, fill #1
  Filled 2023-05-16: qty 2, 28d supply, fill #2

## 2023-02-03 NOTE — Progress Notes (Deleted)
    S:     No chief complaint on file.  52 y.o. adult who presents for diabetes evaluation, education, and management in the context of the LIBERATE Study.  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  Patient was referred and last seen by Primary Care Provider, Gwinda Passe, on 10/31/2022.   Today, patient arrives in good spirits and presents without any assistance. We saw her for LIBERATE enrollment in Feb.   Family/Social History:  -Fhx: HF, DM -Tobacco: former; quit in 2020 -Alcohol: denies  Current diabetes medications include: glipizide 10 mg BID, metformin 1000mg  BID, Trulicity 1.5 mg weekly Patient reports adherence to taking all medications as prescribed. .  Insurance coverage: [place] Medicaid  Patient denies hypoglycemic events.  Patient denies nocturia (nighttime urination).  Patient denies neuropathy (nerve pain). Patient denies visual changes. Patient reports self foot exams.   Patient reported dietary habits:  -Endorses compliance with a diabetic diet   Patient-reported exercise habits: none reported   O:    Lab Results  Component Value Date   HGBA1C 8.0 (A) 02/03/2023   POC A1c Today: 8.0  There were no vitals filed for this visit.  Lipid Panel     Component Value Date/Time   CHOL 120 02/02/2023 1149   CHOL 144 06/30/2022 1419   TRIG 235 (H) 02/02/2023 1149   HDL 32 (L) 02/02/2023 1149   HDL 29 (L) 06/30/2022 1419   CHOLHDL 3.8 02/02/2023 1149   VLDL 47 (H) 11/01/2017 0334   LDLCALC 59 02/02/2023 1149    Clinical Atherosclerotic Cardiovascular Disease (ASCVD): Yes  The ASCVD Risk score (Arnett DK, et al., 2019) failed to calculate for the following reasons:   The patient has a prior MI or stroke diagnosis   Patient is participating in a Managed Medicaid Plan:  Yes    A/P:  LIBERATE Study:  - Study completion. A1c today  is 8.0 (down from 11.4% at initial visit). Unfortunately, this is a little higher than her 7.0 result in May. However, better control overall.   Diabetes longstanding currently above goal with A1c of 8 today. Of note, Aug 13th-19th blood sugar logs were elevated.  Patient is able to verbalize appropriate hypoglycemia management plan. Medication adherence appears appropriate. -Stop glipizide d/t hypogylcemia. -Increase Trulicity to 3mg  weekly.  -Continue metformin at current dose.  -Extensively discussed pathophysiology of diabetes, recommended lifestyle interventions, dietary effects on blood sugar control.  -Counseled on s/sx of and management of hypoglycemia.  -Next A1c anticipated 01/2023.   Written patient instructions provided. Patient verbalized understanding of treatment plan.  Total time in face to face counseling 30 minutes.    Follow-up:  Pharmacist in 1 month.    Butch Penny, PharmD, Patsy [place], CPP Clinical Pharmacist [facility] [phone]

## 2023-02-03 NOTE — Research (Signed)
S:     No chief complaint on file.  52 y.o. adult who presents for diabetes evaluation, education, and management in the context of the LIBERATE Study.  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  Patient was referred and last seen by Primary Care Provider, Gwinda Passe, on 01/18/2023.   Today, patient arrives in good spirits and presents without any assistance.  Patient reports Diabetes was diagnosed in.   Family/Social History:  -Fhx: HF, DM -Tobacco: former; quit in 2020 -Alcohol: denies   Current diabetes medications include: glipizide 10 mg BID, metformin 1000mg  BID, Trulicity 1.5 mg weekly  Patient reports adherence to taking all medications as prescribed.   Insurance coverage: Aetna  Patient denies hypoglycemic events.  CGM Study Study visit: 6 Month Follow Up Visit  CGM Data Download date: 11/06/22 - 02/03/2023 % Time CGM Is Active: 100 % Average glucose (mg/dL): 119 mg/dL Glucose Management Indicator (%): 7.8 % Glucose Variability (%): 33.6 % Time Above Range >180 mg/dL (%): 49 % Time in Range 70-180 mg/dL (%): 50 % Time Below Range <70 mg/dL (%): 1 %  Diabetes Distress Scale Feeling like diabetes is taking up too much of my mental and physical energy every day.: Not a problem Feeling that my doctor doesn't know enough about diabetes and diabetic care. : Not a problem Feeling angry, scared, and/or depressed when I think about living with diabetes : Somewhat serious problem Feeling that my doctor doesn't give my clear directions on how to manage my diabetes. : Not a problem Feeling that im not testing my blood sugars frequently enough.: Not a problem Feeling that I'm often failing with my diabetes routine: A slight problem Feelling that friends and family are not supportive enough of self care efforts.: Not a problem Feeling that diabetes controls my  life.: A moderate problem Feeling that my doctor doesn't take my concerns seriously enough: A slight problem Not feeling confident in my day to day ability to manage diabetes: A slight problem Feeling that I will end up with serious long term complications no matter what I do.: Not a problem Feeling that I am not sticking closely enough to a good meal plan.: Not a problem Feeling that friends or family don't appreciate how difficult living with diabetes can be. : Not a problem Feeling overwhelmed by the demands of living with diabetes.: Not a problem Feeling that I don't have a doctor who I can see.: Not a problem Not feeling motivated to keep up my diabetes self management.: Not a problem Feeling that friends or family don't give me the emotional support that I would like. : Not a problem DDS17 Score: 25 Emotional Burden Score: 2 Physician related distress score: 1.25 Regimen Related Distress score : 1.4 Interpersonal distress score: 1   Patient denies nocturia (nighttime urination).  Patient denies neuropathy (nerve pain). Patient denies visual changes. Patient reports self foot exams.   Patient reported dietary habits:  -Admits struggling with sweets but tries to maintain a healthy diet  Patient-reported exercise habits: none reported    O:  Lab Results  Component Value Date   HGBA1C 8.0 (A) 02/03/2023   POC A1c Today: 8.0  There were no vitals filed for this visit.   Lipid Panel     Component Value Date/Time   CHOL 120 02/02/2023 1149   CHOL 144 06/30/2022 1419   TRIG 235 (H) 02/02/2023 1149  HDL 32 (L) 02/02/2023 1149   HDL 29 (L) 06/30/2022 1419   CHOLHDL 3.8 02/02/2023 1149   VLDL 47 (H) 11/01/2017 0334   LDLCALC 59 02/02/2023 1149    Clinical Atherosclerotic Cardiovascular Disease (ASCVD): Yes  The ASCVD Risk score (Arnett DK, et al., 2019) failed to calculate for the following reasons:   The patient has a prior MI or stroke diagnosis   Patient is  participating in a Managed Medicaid Plan: YES     A/P:  LIBERATE Study:  - Sent rxn for continued supply of Libre 3 sensors. Will submit PA.  Diabetes longstanding currently above goal but improved. Patient is able to verbalize appropriate hypoglycemia management plan. Medication adherence appears appropriate. -Increased dose of Trulicity to 3mg  weekly.   - Changed  glipizide 10 mg BID to glipizide 10mg  XL.  -Continued metformin 1000 mg BID.   -Extensively discussed pathophysiology of diabetes, recommended lifestyle interventions, dietary effects on blood sugar control.  -Counseled on s/sx of and management of hypoglycemia.  -Next A1c anticipated 04/2023.   Written patient instructions provided. Patient verbalized understanding of treatment plan.  Total time in face to face counseling 30 minutes.    Follow-up:  Pharmacist in 4-6 weeks.  Butch Penny, PharmD, Patsy [place], CPP Clinical Pharmacist [facility] [phone]

## 2023-02-03 NOTE — Telephone Encounter (Signed)
Hey friend,   Can we attempt a PA for her [name] 3 sensor? She was in my LIBERATE study and has insurance. She is on Trulicity but not insulin, so I'm not sure it'll go through. But I'd like to try if possible.

## 2023-02-04 LAB — CBC WITH DIFFERENTIAL/PLATELET
Absolute Monocytes: 1113 {cells}/uL — ABNORMAL HIGH (ref 200–950)
Basophils Absolute: 42 {cells}/uL (ref 0–200)
Basophils Relative: 0.4 %
Eosinophils Absolute: 273 {cells}/uL (ref 15–500)
Eosinophils Relative: 2.6 %
HCT: 46.5 % (ref 38.5–50.0)
Hemoglobin: 15.7 g/dL (ref 13.2–17.1)
Lymphs Abs: 3570 {cells}/uL (ref 850–3900)
MCH: 30.3 pg (ref 27.0–33.0)
MCHC: 33.8 g/dL (ref 32.0–36.0)
MCV: 89.8 fL (ref 80.0–100.0)
MPV: 10.8 fL (ref 7.5–12.5)
Monocytes Relative: 10.6 %
Neutro Abs: 5502 {cells}/uL (ref 1500–7800)
Neutrophils Relative %: 52.4 %
Platelets: 301 10*3/uL (ref 140–400)
RBC: 5.18 10*6/uL (ref 4.20–5.80)
RDW: 13.6 % (ref 11.0–15.0)
Total Lymphocyte: 34 %
WBC: 10.5 10*3/uL (ref 3.8–10.8)

## 2023-02-04 LAB — COMPLETE METABOLIC PANEL WITH GFR
AG Ratio: 1.1 (calc) (ref 1.0–2.5)
ALT: 24 U/L (ref 9–46)
AST: 19 U/L (ref 10–35)
Albumin: 4.2 g/dL (ref 3.6–5.1)
Alkaline phosphatase (APISO): 94 U/L (ref 35–144)
BUN: 21 mg/dL (ref 7–25)
CO2: 26 mmol/L (ref 20–32)
Calcium: 9.5 mg/dL (ref 8.6–10.3)
Chloride: 102 mmol/L (ref 98–110)
Creat: 0.74 mg/dL (ref 0.70–1.30)
Globulin: 3.8 g/dL — ABNORMAL HIGH (ref 1.9–3.7)
Glucose, Bld: 228 mg/dL — ABNORMAL HIGH (ref 65–99)
Potassium: 3.9 mmol/L (ref 3.5–5.3)
Sodium: 138 mmol/L (ref 135–146)
Total Bilirubin: 0.4 mg/dL (ref 0.2–1.2)
Total Protein: 8 g/dL (ref 6.1–8.1)
eGFR: 109 mL/min/{1.73_m2} (ref >=60)

## 2023-02-04 LAB — LIPID PANEL
Cholesterol: 120 mg/dL (ref <200)
HDL: 32 mg/dL — ABNORMAL LOW (ref >=40)
LDL Cholesterol (Calc): 59 mg/dL
Non-HDL Cholesterol (Calc): 88 mg/dL (ref <130)
Total CHOL/HDL Ratio: 3.8 (calc) (ref <5.0)
Triglycerides: 235 mg/dL — ABNORMAL HIGH (ref <150)

## 2023-02-04 LAB — RPR: RPR Ser Ql: NONREACTIVE

## 2023-02-04 LAB — HIV-1 RNA QUANT-NO REFLEX-BLD
HIV 1 RNA Quant: 289 {copies}/mL — ABNORMAL HIGH
HIV-1 RNA Quant, Log: 2.46 {Log_copies}/mL — ABNORMAL HIGH

## 2023-02-06 ENCOUNTER — Other Ambulatory Visit (HOSPITAL_COMMUNITY): Payer: Self-pay

## 2023-02-06 ENCOUNTER — Other Ambulatory Visit: Payer: Self-pay

## 2023-02-06 ENCOUNTER — Telehealth: Payer: Self-pay

## 2023-02-06 NOTE — Telephone Encounter (Signed)
Attempted to contact patient - VM box full, unable to leave a message. Will send my chart message.    Geriann Lafont Lesli Albee, CMA

## 2023-02-06 NOTE — Telephone Encounter (Signed)
-----   Message from [place] sent at 02/04/2023 11:09 PM EDT ----- Regarding: FW: [name] a bit viremic. Need her to be fully suppressed so we can do Nurse, adult for consideration of Cabenuva ----- Message ----- From: Interface, [facility] Lab Results In Sent: 02/02/2023   3:28 PM EDT To: Randall Hiss, MD

## 2023-02-09 ENCOUNTER — Other Ambulatory Visit: Payer: Self-pay

## 2023-02-10 ENCOUNTER — Other Ambulatory Visit (HOSPITAL_COMMUNITY): Payer: Self-pay

## 2023-02-10 ENCOUNTER — Other Ambulatory Visit: Payer: Self-pay

## 2023-02-12 DIAGNOSIS — Z419 Encounter for procedure for purposes other than remedying health state, unspecified: Secondary | ICD-10-CM | POA: Diagnosis not present

## 2023-02-14 ENCOUNTER — Other Ambulatory Visit (HOSPITAL_COMMUNITY): Payer: Self-pay

## 2023-02-20 ENCOUNTER — Other Ambulatory Visit: Payer: Self-pay

## 2023-02-20 ENCOUNTER — Encounter (INDEPENDENT_AMBULATORY_CARE_PROVIDER_SITE_OTHER): Payer: Self-pay | Admitting: Primary Care

## 2023-02-20 ENCOUNTER — Other Ambulatory Visit (HOSPITAL_COMMUNITY): Payer: Self-pay

## 2023-02-23 ENCOUNTER — Other Ambulatory Visit: Payer: Self-pay

## 2023-02-23 ENCOUNTER — Other Ambulatory Visit (INDEPENDENT_AMBULATORY_CARE_PROVIDER_SITE_OTHER): Payer: Self-pay | Admitting: Primary Care

## 2023-02-23 MED ORDER — FLUOXETINE HCL 20 MG PO TABS
20.0000 mg | ORAL_TABLET | Freq: Every day | ORAL | 1 refills | Status: AC
Start: 2023-02-23 — End: ?
  Filled 2023-02-23: qty 30, 30d supply, fill #0

## 2023-03-02 ENCOUNTER — Other Ambulatory Visit: Payer: Self-pay

## 2023-03-02 ENCOUNTER — Other Ambulatory Visit (HOSPITAL_COMMUNITY): Payer: Self-pay

## 2023-03-10 ENCOUNTER — Ambulatory Visit: Payer: 59 | Attending: Primary Care | Admitting: Pharmacist

## 2023-03-10 ENCOUNTER — Encounter: Payer: Self-pay | Admitting: Pharmacist

## 2023-03-10 DIAGNOSIS — E1142 Type 2 diabetes mellitus with diabetic polyneuropathy: Secondary | ICD-10-CM

## 2023-03-10 DIAGNOSIS — Z7985 Long-term (current) use of injectable non-insulin antidiabetic drugs: Secondary | ICD-10-CM

## 2023-03-10 DIAGNOSIS — Z7984 Long term (current) use of oral hypoglycemic drugs: Secondary | ICD-10-CM

## 2023-03-10 NOTE — Progress Notes (Signed)
S:    52 y.o. adult who presents for diabetes evaluation, education, and management via telemedicine. 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 Completed LIBERATE study on 02/03/2023.  Patient was referred and last seen by Primary Care Provider, Gwinda Passe, on 01/18/2023. Last seen by pharmacist on 02/03/2023. At visit, Trulicity was increased to 3mg  weekly, glipizide was changed from 10 mg BID to glipizide 10mg  XL, and metformin 1000 mg BID was continued.  Patient arrives in okay spirits and presents without any assistance.   Family/Social History:  -Fhx: HF, DM -Tobacco: former; quit in 2020 -Alcohol: denies  Current diabetes medications include: glipizide 10mg  XL, metformin 1000mg  BID, Trulicity 3 mg weekly  Current hypertension medications include: none Current hyperlipidemia medications include: rosuvastatin 20 mg   Patient reports adherence to taking all medications as prescribed.   Do you feel that your medications are working for you? yes Have you been experiencing any side effects to the medications prescribed? no Do you have any problems obtaining medications due to transportation or finances? no Insurance coverage: Aetna   Patient denies hypoglycemic events.  Patient denies nocturia (nighttime urination).  Patient denies neuropathy (nerve pain). Patient denies visual changes. Patient reports self foot exams.    Patient reported dietary habits:  -Admits struggling with sweets but tries to maintain a healthy diet. Reports eating a lot of bread.    Patient-reported exercise habits: none reported    O:  Libre3 CGM Download today- 9/14-8/27 % Time CGM is active: 98% Average Glucose: 199 mg/dL Glucose Management Indicator: 8.1%  Glucose Variability: 20.5% (goal <36%) Time in Goal:  - Time in range 70-180: 33% - Time above range: 67% - Time below range:  0% Observed patterns:   Lab Results  Component Value Date   HGBA1C 8.0 (A) 02/03/2023   There were no vitals filed for this visit.  Lipid Panel     Component Value Date/Time   CHOL 120 02/02/2023 1149   CHOL 144 06/30/2022 1419   TRIG 235 (H) 02/02/2023 1149   HDL 32 (L) 02/02/2023 1149   HDL 29 (L) 06/30/2022 1419   CHOLHDL 3.8 02/02/2023 1149   VLDL 47 (H) 11/01/2017 0334   LDLCALC 59 02/02/2023 1149    Clinical Atherosclerotic Cardiovascular Disease (ASCVD): Yes  The ASCVD Risk score (Arnett DK, et al., 2019) failed to calculate for the following reasons:   The patient has a prior MI or stroke diagnosis   Patient is participating in a Managed Medicaid Plan: Yes   A/P: Diabetes longstanding, currently uncontrolled based on most recent A1c. Patient is able to verbalize appropriate hypoglycemia management plan. Medication adherence appears good. Reports no issues with current medication regimen.  -Continue glipizide 10mg  XL, metformin 1000mg  BID, and Trulicity 3 mg weekly  -Plans to work on diet (decreasing bread intake) and will reassess increasing Trulicity to 4.5 mg weekly at next visit -Patient educated on purpose, proper use, and potential adverse effects of medications.  -Extensively discussed pathophysiology of diabetes, recommended lifestyle interventions, dietary effects on blood sugar control.  -Counseled on s/sx of and management of hypoglycemia.  -Next A1c anticipated 04/2023.   ASCVD risk - secondary prevention in patient with diabetes. Last LDL is 59, at goal of <70 mg/dL. High intensity statin indicated.  -Continue rosuvastatin 20 mg   Written patient instructions provided. Patient verbalized understanding of treatment plan.  Total time in face to face counseling  30 minutes.    Follow-up:  Pharmacist: 04/28/2023 at 11 am PCP clinic visit: no appointment scheduled currently

## 2023-03-14 DIAGNOSIS — Z419 Encounter for procedure for purposes other than remedying health state, unspecified: Secondary | ICD-10-CM | POA: Diagnosis not present

## 2023-03-17 ENCOUNTER — Other Ambulatory Visit: Payer: Self-pay | Admitting: Primary Care

## 2023-03-17 DIAGNOSIS — Z1212 Encounter for screening for malignant neoplasm of rectum: Secondary | ICD-10-CM

## 2023-03-17 DIAGNOSIS — Z1211 Encounter for screening for malignant neoplasm of colon: Secondary | ICD-10-CM

## 2023-03-24 ENCOUNTER — Other Ambulatory Visit: Payer: Self-pay

## 2023-03-29 ENCOUNTER — Other Ambulatory Visit (HOSPITAL_COMMUNITY): Payer: Self-pay

## 2023-04-03 ENCOUNTER — Other Ambulatory Visit: Payer: Self-pay

## 2023-04-06 ENCOUNTER — Ambulatory Visit: Payer: 59 | Admitting: Infectious Disease

## 2023-04-11 ENCOUNTER — Other Ambulatory Visit: Payer: Self-pay

## 2023-04-12 ENCOUNTER — Telehealth: Payer: Self-pay

## 2023-04-12 NOTE — Telephone Encounter (Signed)
Detectable Viral Load Intervention (DVL)  Most recent VL:  HIV 1 RNA Quant  Date Value Ref Range Status  02/02/2023 289 (H) Copies/mL Final  10/19/2022 30 copies/mL Corrected    Comment:    (NOTE) The reportable range for this assay is 20 to 10,000,000 copies HIV-1 RNA/mL.   07/06/2022 158 (H) Copies/mL Final    Last Clinic Visit:  02/02/23  Current ART regimen: Biktarvy  Appointment status: patient does not have future appointment scheduled   Medication last dispensed (per chart review):  bictegravir-emtricitabine-tenofovir AF (BIKTARVY) 50-200-25 MG TABS tablet 03/07/2023 30 30 tablet [name], Lisette Grinder, MD [facility]...    Medication Adherence   NOT ABLE TO ASSESS   Barriers to Care  NOT ABLE TO ASSESS   Interventions   Called patient to discuss medication adherence and possible barriers to care. Per last note patient is homeless and working with [facility]. Medication filled regularly. Will send mychart message. Juanita Laster, RMA

## 2023-04-13 ENCOUNTER — Other Ambulatory Visit: Payer: Self-pay

## 2023-04-14 DIAGNOSIS — Z419 Encounter for procedure for purposes other than remedying health state, unspecified: Secondary | ICD-10-CM | POA: Diagnosis not present

## 2023-04-18 ENCOUNTER — Encounter: Payer: Self-pay | Admitting: Pharmacist

## 2023-04-25 ENCOUNTER — Other Ambulatory Visit: Payer: Self-pay

## 2023-04-25 NOTE — Progress Notes (Signed)
Specialty Pharmacy Ongoing Clinical Assessment Note  [name] is a 52 y.o. adult who is being followed by the specialty pharmacy service for RxSp HIV   Patient's specialty medication(s) reviewed today: Bictegravir-Emtricitab-Tenofov   Missed doses in the last 4 weeks: 0   Patient/Caregiver did not have any additional questions or concerns.   Therapeutic benefit summary: Patient is achieving benefit   Adverse events/side effects summary: No adverse events/side effects   Patient's therapy is appropriate to: Continue    Goals Addressed             This Visit's Progress    Achieve Undetectable HIV Viral Load < 20       Patient is not on track and no change. Patient will maintain adherence and adhere to provider and/or lab appointments         Follow up:  3 months  Otto Herb Specialty Pharmacist

## 2023-04-25 NOTE — Progress Notes (Signed)
Specialty Pharmacy Refill Coordination Note  [name] is a 52 y.o. adult contacted today regarding refills of specialty medication(s) Bictegravir-Emtricitab-Tenofov   Patient requested Delivery   Delivery date: 04/27/23   Verified address: [address]  [place] [address]   Medication will be filled on 04/26/23.

## 2023-04-26 LAB — COLOGUARD: COLOGUARD: NEGATIVE

## 2023-04-27 NOTE — Progress Notes (Unsigned)
S:    52 y.o. adult who presents for diabetes evaluation, education, and management via telemedicine. 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 Completed LIBERATE study on 02/03/2023.  Patient was referred and last seen by Primary Care Provider, Gwinda Passe, on 01/18/2023. Last seen by pharmacist on 02/18/23. At visit, all medications were continued on their doses with plans for patient to decrease bread intake.  Patient arrives in okay spirits and presents without any assistance.  Adherence *** -glipizide filled 9/10 then 11/13 -metformin filled 8/19 then 11/13 -rosuvastatin filled 07/2022 Bread intake *** Side effects w/ Trulicity ***  Family/Social History:  -Fhx: HF, DM -Tobacco: former; quit in 2020 -Alcohol: denies  Current diabetes medications include: glipizide 10mg  XL daily, metformin 1000mg  BID, Trulicity 3 mg weekly  Current hypertension medications include: none Current hyperlipidemia medications include: rosuvastatin 20 mg   Insurance coverage: [employer] + Medicaid  Patient denies hypoglycemic events.***  *** Patient denies nocturia (nighttime urination).  Patient denies neuropathy (nerve pain). Patient denies visual changes. Patient reports self foot exams.    Patient reported dietary habits: *** -Admits struggling with sweets but tries to maintain a healthy diet. Reports eating a lot of bread.    Patient-reported exercise habits: none reported   O:  Libre3 CGM Download today: **** % Time CGM is active:  Average Glucose:  mg/dL Glucose Management Indicator:   Glucose Variability:  (goal <36%) Time in Goal:  - Time in range 70-180:  - Time above range:  - Time below range: Observed patterns:  Per 02/28/23 PharmD visit: Radene Journey CGM Download today- 9/14-8/27 % Time CGM is active: 98% Average Glucose: 199 mg/dL Glucose Management Indicator: 8.1%   Glucose Variability: 20.5% (goal <36%) Time in Goal:  - Time in range 70-180: 33% - Time above range: 67% - Time below range: 0%  Lab Results  Component Value Date   HGBA1C 8.0 (A) 02/03/2023   There were no vitals filed for this visit.  Lipid Panel     Component Value Date/Time   CHOL 120 02/02/2023 1149   CHOL 144 06/30/2022 1419   TRIG 235 (H) 02/02/2023 1149   HDL 32 (L) 02/02/2023 1149   HDL 29 (L) 06/30/2022 1419   CHOLHDL 3.8 02/02/2023 1149   VLDL 47 (H) 11/01/2017 0334   LDLCALC 59 02/02/2023 1149    Clinical Atherosclerotic Cardiovascular Disease (ASCVD): Yes  The ASCVD Risk score (Arnett DK, et al., 2019) failed to calculate for the following reasons:   The patient has a prior MI or stroke diagnosis   Patient is participating in a Managed Medicaid Plan: Yes   A/P: Diabetes longstanding, currently uncontrolled based on today's A1c of ***, which has increased/decreased from previous A1c of 8% (01/2023). Patient is able to verbalize appropriate hypoglycemia management plan. Medication adherence appears good. Reports no issues with current medication regimen. eGFR 109 on most recent CMP on 02/02/23. -Continue glipizide 10mg  XL daily -Continue metformin 1000mg  BID -Increase Trulicity 4.5 mg weekly (just picked up RF of 3mg  on 11/13) -F/u labs ordered - UACR (last done in 2013 - 15.3) -Patient educated on purpose, proper use, and potential adverse effects of medications.  -Extensively discussed pathophysiology of diabetes, recommended lifestyle interventions, dietary effects on blood sugar control.  -Counseled on s/sx of and management of hypoglycemia.  -Next A1c anticipated 07/2023  ASCVD risk - secondary prevention in patient with diabetes. Last LDL on 02/02/23 is  59, at goal of <70 mg/dL. High intensity statin indicated. LFTs WNL per most recent CMP on 02/02/23. -Continue rosuvastatin 20 mg daily  Written patient instructions provided. Patient verbalized  understanding of treatment plan.  Total time in face to face counseling *** minutes.    Follow-up:  Pharmacist: 1 month PCP clinic visit: not scheduled  Nicole Kindred, PharmD PGY1 Pharmacy Resident 04/27/2023 10:50 PM

## 2023-04-28 ENCOUNTER — Ambulatory Visit: Payer: 59 | Admitting: Pharmacist

## 2023-05-10 ENCOUNTER — Other Ambulatory Visit (HOSPITAL_COMMUNITY): Payer: Self-pay

## 2023-05-14 DIAGNOSIS — Z419 Encounter for procedure for purposes other than remedying health state, unspecified: Secondary | ICD-10-CM | POA: Diagnosis not present

## 2023-05-16 ENCOUNTER — Other Ambulatory Visit: Payer: Self-pay

## 2023-05-16 NOTE — Progress Notes (Signed)
Specialty Pharmacy Refill Coordination Note  [name] is a 52 y.o. adult contacted today regarding refills of specialty medication(s) Bictegravir-Emtricitab-Tenofov   Patient requested Delivery   Delivery date: 05/24/23   Verified address: [address]  [place]  [address]   Medication will be filled on 05/23/23.

## 2023-05-22 ENCOUNTER — Ambulatory Visit (INDEPENDENT_AMBULATORY_CARE_PROVIDER_SITE_OTHER): Payer: Self-pay

## 2023-05-22 NOTE — Telephone Encounter (Signed)
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 Her sister looked into nostril with a flashlight and saw a white bump. Advised no availability with PCP, no transportation to go to the UC or Mobile Unit. Advised virtual UC visit, first available tomorrow morning. Scheduled for 0800. Advised bactroban ointment on Q-Tip to inside of nostril and not to pack nostril due to drainage.    Reason for Disposition  [1] MODERATE pain (e.g., interferes with normal activities) AND [2] constant AND [3] present > 24 hours  Answer Assessment - Initial Assessment Questions 1. ONSET: "When did the pain start?" (e.g., minutes, hours, days)     Saturday 2. ONSET: "Does the pain come and go, or has it been constant since it started?" (e.g., constant, intermittent, fleeting)     Yes  3. SEVERITY: "How bad is the pain?"   (Scale 1-10; mild, moderate or severe)   - MILD (1-3): doesn't interfere with normal activities    - MODERATE (4-7): interferes with normal activities or awakens from sleep    - SEVERE (8-10): excruciating pain, unable to do any normal activities      5-6 4. LOCATION: "Where does it hurt?"      Whole face hurts 5. RASH: "Is there any redness, rash, or swelling of the face?"     Some in the ridge of nostril hole 6. FEVER: "Do you have a fever?" If Yes, ask: "What is it, how was it  measured, and when did it start?"      No 7. OTHER SYMPTOMS: "Do you have any other symptoms?" (e.g., fever, toothache, nasal discharge, nasal congestion, clicking sensation in jaw joint)    Drainage milky green color from right nostril due to an ulcer that burst, nasal congestion  Protocols used: Face Pain-A-AH

## 2023-05-23 ENCOUNTER — Other Ambulatory Visit: Payer: Self-pay

## 2023-05-23 ENCOUNTER — Other Ambulatory Visit (HOSPITAL_COMMUNITY): Payer: Self-pay

## 2023-05-23 ENCOUNTER — Telehealth: Payer: 59 | Admitting: Nurse Practitioner

## 2023-05-23 DIAGNOSIS — J0100 Acute maxillary sinusitis, unspecified: Secondary | ICD-10-CM | POA: Diagnosis not present

## 2023-05-23 DIAGNOSIS — R051 Acute cough: Secondary | ICD-10-CM

## 2023-05-23 MED ORDER — MUPIROCIN 2 % EX OINT
1.0000 | TOPICAL_OINTMENT | Freq: Every day | CUTANEOUS | 0 refills | Status: DC
  Filled 2023-05-23: qty 22, 7d supply, fill #0

## 2023-05-23 MED ORDER — ALBUTEROL SULFATE HFA 108 (90 BASE) MCG/ACT IN AERS
2.0000 | INHALATION_SPRAY | Freq: Four times a day (QID) | RESPIRATORY_TRACT | 0 refills | Status: AC | PRN
Start: 2023-05-23 — End: ?
  Filled 2023-05-23: qty 6.7, 30d supply, fill #0

## 2023-05-23 MED ORDER — DOXYCYCLINE HYCLATE 100 MG PO TABS
100.0000 mg | ORAL_TABLET | Freq: Two times a day (BID) | ORAL | 0 refills | Status: AC
Start: 2023-05-23 — End: 2023-06-08
  Filled 2023-05-23: qty 20, 10d supply, fill #0

## 2023-05-23 NOTE — Progress Notes (Signed)
Virtual Visit Consent   [name], you are scheduled for [name] virtual visit with [name] [place] provider today. Just as with appointments in the office, your consent must be obtained to participate. Your consent will be active for this visit and any virtual visit you may have with one of our providers in the next 365 days. If you have [name] MyChart account, [name] copy of this consent can be sent to you electronically.  As this is [name] virtual visit, video technology does not allow for your provider to perform [name] traditional examination. This may limit your provider's ability to fully assess your condition. If your provider identifies any concerns that need to be evaluated in person or the need to arrange testing (such as labs, EKG, etc.), we will make arrangements to do so. Although advances in technology are sophisticated, we cannot ensure that it will always work on either your end or our end. If the connection with [name] video visit is poor, the visit may have to be switched to [name] telephone visit. With either [name] video or telephone visit, we are not always able to ensure that we have [name] secure connection.  By engaging in this virtual visit, you consent to the provision of healthcare and authorize for your insurance to be billed (if applicable) for the services provided during this visit. Depending on your insurance coverage, you may receive [name] charge related to this service.  I need to obtain your verbal consent now. Are you willing to proceed with your visit today? [name] has provided verbal consent on 05/23/2023 for [name] virtual visit (video or telephone). [name] Simas, FNP  Date: 05/23/2023 7:59 AM  Virtual Visit via Video Note   I, [name], connected with  [name]  ([id number], Oct 25, 1970) on 05/23/23 at  8:00 AM EST by [name] video-enabled telemedicine application and verified that I am speaking with the correct person using two identifiers.  Location: Patient: Virtual Visit Location Patient:  Home Provider: Virtual Visit Location Provider: Home Office   I discussed the limitations of evaluation and management by telemedicine and the availability of in person appointments. The patient expressed understanding and agreed to proceed.    History of Present Illness: [name] ([name]) [name] is [name] 52 y.o. who identifies as [name] transgender male is being seen today for sinus congestion that caused [name] bump to rupture from right nostril yesterday   She has had sinus pressure for the past week   Noted after the burst in the nostril that green drainage came out as well   Has tried Mucinex for relief x1   Denies allergies or chronic sinusitis symptoms  Denies fevers   Some cough present history of asthma  Uses daily inhaler for relief as directed but feels more chest congestion is present now as well   Sinus pressure mainly in left maxillary sinus today  Had pneumonia earlier this year   Problems:  Patient Active Problem List   Diagnosis Date Noted   Inguinal swelling 10/18/2022   Acute cystitis with hematuria 10/18/2022   Epididymo-orchitis 10/17/2022   Right foot drop 09/15/2020   Gait abnormality 09/15/2020   DM type 2 with diabetic peripheral neuropathy (HCC) 09/15/2020   Loss of balance 09/14/2020   S/P lumbar fusion 01/09/2019   Encounter for counseling for tobacco use disorder 09/12/2018   Ischemic stroke (HCC) 10/31/2017   Visual field defect    Complicated migraine    Migraine with visual aura  Essential hypertension 06/01/2016   Type 2 diabetes mellitus with obesity (HCC) 06/01/2016   Poorly controlled diabetes mellitus (HCC) 04/27/2015   Hearing loss secondary to cerumen impaction 02/07/2013   Smoker 03/07/2011   Asthma 02/07/2008   Male-to-male transgender person 10/31/2007   Depression with anxiety 10/17/2007   Human immunodeficiency virus (HIV) disease (HCC) 04/14/2006   HIDRADENITIS SUPPURATIVA 04/14/2006    Allergies:  Allergies  Allergen Reactions    Propoxyphene N-Acetaminophen Other (See Comments)    Stomach cramps   Medications:  Current Outpatient Medications:    ACCU-CHEK SOFTCLIX LANCETS lancets, 1 each by Other route 3 (three) times daily., Disp: 100 each, Rfl: 5   bictegravir-emtricitabine-tenofovir AF (BIKTARVY) 50-200-25 MG TABS tablet, Take 1 tablet by mouth daily., Disp: 30 tablet, Rfl: 11   blood glucose meter kit and supplies, Dispense based on patient and insurance preference. Use up to four times daily as directed. (FOR ICD-10 E10.9, E11.9)., Disp: 1 each, Rfl: 0   Continuous Glucose Sensor (FREESTYLE LIBRE 3 SENSOR) MISC, Place 1 sensor on the skin every 14 days. Use to check glucose continuously, Disp: 2 each, Rfl: 6   docusate sodium (COLACE CLEAR) 50 MG capsule, Take 1 capsule (50 mg total) by mouth 2 times daily. (Patient not taking: Reported on 02/02/2023), Disp: 10 capsule, Rfl: 0   Dulaglutide (TRULICITY) 3 MG/0.5ML SOAJ, Inject 3 mg as directed once [name] week., Disp: 6 mL, Rfl: 1   estradiol (ESTRACE) 2 MG tablet, Take 1 tablet (2 mg total) by mouth daily., Disp: 30 tablet, Rfl: 11   FLUoxetine (PROZAC) 20 MG tablet, Take 1 tablet (20 mg total) by mouth daily., Disp: 90 tablet, Rfl: 1   gabapentin (NEURONTIN) 300 MG capsule, Take by mouth., Disp: , Rfl:    glipiZIDE (GLUCOTROL XL) 10 MG 24 hr tablet, Take 1 tablet (10 mg total) by mouth daily with breakfast., Disp: 90 tablet, Rfl: 1   Lancet Devices (ONETOUCH DELICA PLUS LANCING) MISC, Use as directed, Disp: 1 each, Rfl: 0   metFORMIN (GLUCOPHAGE) 1000 MG tablet, Take 1 tablet (1,000 mg total) by mouth 2 (two) times daily with [name] meal., Disp: 180 tablet, Rfl: 1   oxyCODONE (OXY IR/ROXICODONE) 5 MG immediate release tablet, Take 1 tablet (5 mg) by mouth every 4 hours as needed for severe pain. (Patient not taking: Reported on 11/15/2022), Disp: 30 tablet, Rfl: 0   PROAIR HFA 108 (90 Base) MCG/ACT inhaler, INHALE 2 PUFFS BY MOUTH INTO LUNGS EVERY 6 HOURS AS NEEDED FOR  WHEEZING OR SHORTNESS OF BREATH, Disp: 8.5 g, Rfl: 1   rosuvastatin (CRESTOR) 20 MG tablet, Take 1 tablet (20 mg total) by mouth daily., Disp: 90 tablet, Rfl: 3   spironolactone (ALDACTONE) 100 MG tablet, Take 1 tablet by mouth daily., Disp: , Rfl:   Observations/Objective: Patient is well-developed, well-nourished in no acute distress.  Resting comfortably  at home.  Head is normocephalic, atraumatic.  No labored breathing.  Speech is clear and coherent with logical content.  Patient is alert and oriented at baseline.    Assessment and Plan:  1. Acute non-recurrent maxillary sinusitis  - mupirocin ointment (BACTROBAN) 2 %; Apply 1 Application topically daily.  Dispense: 22 g; Refill: 0 - doxycycline (VIBRA-TABS) 100 MG tablet; Take 1 tablet (100 mg total) by mouth 2 (two) times daily for 10 days.  Dispense: 20 tablet; Refill: 0  2. Acute cough  - albuterol (VENTOLIN HFA) 108 (90 Base) MCG/ACT inhaler; Inhale 2 puffs into the lungs every 6 (  six) hours as needed for wheezing or shortness of breath.  Dispense: 8 g; Refill: 0    Follow Up Instructions: I discussed the assessment and treatment plan with the patient. The patient was provided an opportunity to ask questions and all were answered. The patient agreed with the plan and demonstrated an understanding of the instructions.  [name] copy of instructions were sent to the patient via MyChart unless otherwise noted below.    The patient was advised to call back or seek an in-person evaluation if the symptoms worsen or if the condition fails to improve as anticipated.    [name] Simas, FNP

## 2023-05-24 ENCOUNTER — Ambulatory Visit (INDEPENDENT_AMBULATORY_CARE_PROVIDER_SITE_OTHER): Payer: 59 | Admitting: Primary Care

## 2023-05-24 ENCOUNTER — Other Ambulatory Visit: Payer: Self-pay

## 2023-05-24 ENCOUNTER — Other Ambulatory Visit (HOSPITAL_COMMUNITY): Payer: Self-pay

## 2023-05-25 ENCOUNTER — Other Ambulatory Visit (HOSPITAL_COMMUNITY): Payer: Self-pay

## 2023-05-25 ENCOUNTER — Other Ambulatory Visit: Payer: Self-pay

## 2023-05-25 NOTE — Progress Notes (Signed)
Medication delivery was delayed due to billing issues with insurance, issues have been resolved. Medication will be filled 05/25/23 and delivered on 05/26/23. Patients voice mail box is full unable to update patient.

## 2023-05-26 ENCOUNTER — Other Ambulatory Visit: Payer: Self-pay

## 2023-06-13 ENCOUNTER — Other Ambulatory Visit: Payer: Self-pay

## 2023-06-14 DIAGNOSIS — Z419 Encounter for procedure for purposes other than remedying health state, unspecified: Secondary | ICD-10-CM | POA: Diagnosis not present

## 2023-06-16 ENCOUNTER — Other Ambulatory Visit: Payer: Self-pay

## 2023-06-21 ENCOUNTER — Other Ambulatory Visit (HOSPITAL_COMMUNITY): Payer: Self-pay

## 2023-06-22 ENCOUNTER — Other Ambulatory Visit: Payer: Self-pay

## 2023-06-22 ENCOUNTER — Telehealth (INDEPENDENT_AMBULATORY_CARE_PROVIDER_SITE_OTHER): Payer: Self-pay | Admitting: Primary Care

## 2023-06-22 ENCOUNTER — Ambulatory Visit (INDEPENDENT_AMBULATORY_CARE_PROVIDER_SITE_OTHER): Payer: Self-pay

## 2023-06-22 ENCOUNTER — Other Ambulatory Visit (HOSPITAL_COMMUNITY): Payer: Self-pay

## 2023-06-22 ENCOUNTER — Other Ambulatory Visit (INDEPENDENT_AMBULATORY_CARE_PROVIDER_SITE_OTHER): Payer: 59

## 2023-06-22 NOTE — Progress Notes (Signed)
 Specialty Pharmacy Refill Coordination Note  [name] is a 53 y.o. adult contacted today regarding refills of specialty medication(s) Bictegravir-Emtricitab-Tenofov (Biktarvy )   Patient requested Delivery   Delivery date: 06/26/23   Verified address: [address] [place] [address]   Medication will be filled on 06/23/23.

## 2023-06-22 NOTE — Telephone Encounter (Signed)
Will forward to provider.  FYI

## 2023-06-22 NOTE — Telephone Encounter (Signed)
 Called pt to schedule an apt. Pt was scheduled for 06/29/23

## 2023-06-22 NOTE — Telephone Encounter (Addendum)
 Per Jesslyn - pt needs appt with provider. Called pt back. No appts in office today. Pt will go to UC.    Chief Complaint: Unsure of blood sugar readings Symptoms: None at the moment Frequency: today Pertinent Negatives: Patient denies current s/s Disposition: [] ED /[] Urgent Care (no appt availability in office) / [x] Appointment(In office/virtual)/ []  [facility]/ [] Home Care/ [] Refused Recommended Disposition /[] [facility]/ []  Follow-up with PCP Additional Notes: Pt was awoken by [employer] low blood sugar of 54. Pt ate a brownie and peanut butter as well as drank some kool-aid. Pt was woozy at that time. Pt felt better and went back to bed. Pt was awoken again by the [employer] with a reading of 64. Pt felt fine and did a fingerstick which read 170. Pt has removed libre. Pt requested an appt to see like today. Appt scheduled.  Teams message to Jesslyn Elyse    Reason for Disposition  [1] Blood glucose 70  mg/dL (3.9 mmol/L) or below OR symptomatic, now improved with Care Advice AND [2] cause unknown  Answer Assessment - Initial Assessment Questions 1. SYMPTOMS: What symptoms are you concerned about?     Low blood sugar 2. ONSET:  When did the symptoms start?     This morning 3. BLOOD GLUCOSE: What is your blood glucose level?      64 is reading. Earlier 50 per  4. USUAL RANGE: What is your blood glucose level usually? (e.g., usual fasting morning value, usual evening value)     normal 6. INSULIN : Do you take insulin ? What type of insulin (s) do you use? What is the mode of delivery? (syringe, pen; injection or pump) When did you last give yourself an insulin  dose? (i.e., time or hours/minutes ago) How much did you give? (i.e., how many units)     no 7. DIABETES PILLS: Do you take any pills for your diabetes? If Yes, ask: What is the name of the medicine(s) that you take for high blood sugar?     yes 8. OTHER SYMPTOMS: Do you have any  symptoms? (e.g., fever, frequent urination, difficulty breathing, vomiting)     Was dizzy  - couldn't keep balance - peanut butter kool - aid and brownie 9. LOW BLOOD GLUCOSE TREATMENT: What have you done so far to treat the low blood glucose level?     food  Protocols used: Diabetes - Low Blood Sugar-A-AH

## 2023-06-23 ENCOUNTER — Other Ambulatory Visit: Payer: Self-pay

## 2023-06-29 ENCOUNTER — Ambulatory Visit (INDEPENDENT_AMBULATORY_CARE_PROVIDER_SITE_OTHER): Payer: 59 | Admitting: Primary Care

## 2023-07-10 ENCOUNTER — Other Ambulatory Visit (HOSPITAL_COMMUNITY): Payer: Self-pay

## 2023-07-12 ENCOUNTER — Other Ambulatory Visit: Payer: Self-pay

## 2023-07-13 ENCOUNTER — Other Ambulatory Visit: Payer: Self-pay

## 2023-07-14 ENCOUNTER — Other Ambulatory Visit: Payer: Self-pay

## 2023-07-15 DIAGNOSIS — Z419 Encounter for procedure for purposes other than remedying health state, unspecified: Secondary | ICD-10-CM | POA: Diagnosis not present

## 2023-07-19 ENCOUNTER — Other Ambulatory Visit: Payer: Self-pay

## 2023-07-19 ENCOUNTER — Other Ambulatory Visit: Payer: Self-pay | Admitting: Pharmacist

## 2023-07-19 NOTE — Progress Notes (Signed)
 Specialty Pharmacy Ongoing Clinical Assessment Note  [name] is a 53 y.o. adult who is being followed by the specialty pharmacy service for RxSp HIV   Patient's specialty medication(s) reviewed today: Bictegravir-Emtricitab-Tenofov (Biktarvy )   Missed doses in the last 4 weeks: 1-2   Patient/Caregiver did not have any additional questions or concerns.   Therapeutic benefit summary: Patient is achieving benefit   Adverse events/side effects summary: No adverse events/side effects   Patient's therapy is appropriate to: Continue    Goals Addressed   None     Follow up:  3 months Needs clinic appointment scheduled.  Alan JINNY Geralds Specialty Pharmacist

## 2023-07-21 DIAGNOSIS — F331 Major depressive disorder, recurrent, moderate: Secondary | ICD-10-CM | POA: Diagnosis not present

## 2023-07-21 DIAGNOSIS — F411 Generalized anxiety disorder: Secondary | ICD-10-CM | POA: Diagnosis not present

## 2023-07-21 DIAGNOSIS — F17210 Nicotine dependence, cigarettes, uncomplicated: Secondary | ICD-10-CM | POA: Diagnosis not present

## 2023-07-21 DIAGNOSIS — F4312 Post-traumatic stress disorder, chronic: Secondary | ICD-10-CM | POA: Diagnosis not present

## 2023-07-24 ENCOUNTER — Other Ambulatory Visit (HOSPITAL_COMMUNITY): Payer: Self-pay

## 2023-07-28 ENCOUNTER — Other Ambulatory Visit: Payer: Self-pay

## 2023-07-28 DIAGNOSIS — F17210 Nicotine dependence, cigarettes, uncomplicated: Secondary | ICD-10-CM | POA: Diagnosis not present

## 2023-07-28 DIAGNOSIS — F331 Major depressive disorder, recurrent, moderate: Secondary | ICD-10-CM | POA: Diagnosis not present

## 2023-07-28 DIAGNOSIS — F4312 Post-traumatic stress disorder, chronic: Secondary | ICD-10-CM | POA: Diagnosis not present

## 2023-07-28 DIAGNOSIS — F411 Generalized anxiety disorder: Secondary | ICD-10-CM | POA: Diagnosis not present

## 2023-07-28 MED ORDER — BUSPIRONE HCL 10 MG PO TABS
10.0000 mg | ORAL_TABLET | Freq: Two times a day (BID) | ORAL | 0 refills | Status: AC
  Filled 2023-07-28: qty 60, 30d supply, fill #0

## 2023-07-28 MED ORDER — VRAYLAR 1.5 MG PO CAPS
1.5000 mg | ORAL_CAPSULE | Freq: Every day | ORAL | 0 refills | Status: AC
  Filled 2023-07-28: qty 30, 30d supply, fill #0

## 2023-07-28 MED ORDER — MIRTAZAPINE 15 MG PO TABS
15.0000 mg | ORAL_TABLET | Freq: Every day | ORAL | 0 refills | Status: DC
  Filled 2023-07-28: qty 30, 30d supply, fill #0

## 2023-08-01 ENCOUNTER — Other Ambulatory Visit: Payer: Self-pay

## 2023-08-10 ENCOUNTER — Ambulatory Visit (INDEPENDENT_AMBULATORY_CARE_PROVIDER_SITE_OTHER): Payer: 59 | Admitting: Primary Care

## 2023-08-12 DIAGNOSIS — Z419 Encounter for procedure for purposes other than remedying health state, unspecified: Secondary | ICD-10-CM | POA: Diagnosis not present

## 2023-08-16 ENCOUNTER — Telehealth (INDEPENDENT_AMBULATORY_CARE_PROVIDER_SITE_OTHER): Payer: Self-pay | Admitting: Family Medicine

## 2023-08-16 ENCOUNTER — Other Ambulatory Visit (HOSPITAL_COMMUNITY): Payer: Self-pay

## 2023-08-16 NOTE — Telephone Encounter (Signed)
 Called pt to remind them about appt. Pt did not answer and could not leave VM.

## 2023-08-17 ENCOUNTER — Ambulatory Visit (INDEPENDENT_AMBULATORY_CARE_PROVIDER_SITE_OTHER): Payer: 59 | Admitting: Primary Care

## 2023-09-23 DIAGNOSIS — Z419 Encounter for procedure for purposes other than remedying health state, unspecified: Secondary | ICD-10-CM | POA: Diagnosis not present

## 2023-10-11 ENCOUNTER — Other Ambulatory Visit: Payer: Self-pay | Admitting: Pharmacist

## 2023-10-11 NOTE — Progress Notes (Signed)
 Inactivating as patient has not filled in 3 months and has missed multiple appointments - clinic staff and call center have been unable to reach.  Nicklas Barns, PharmD, CPP, BCIDP, AAHIVP Clinical Pharmacist Practitioner Infectious Diseases Clinical Pharmacist [facility]

## 2023-10-23 DIAGNOSIS — Z419 Encounter for procedure for purposes other than remedying health state, unspecified: Secondary | ICD-10-CM | POA: Diagnosis not present

## 2023-11-23 DIAGNOSIS — Z419 Encounter for procedure for purposes other than remedying health state, unspecified: Secondary | ICD-10-CM | POA: Diagnosis not present

## 2023-11-26 DIAGNOSIS — E1169 Type 2 diabetes mellitus with other specified complication: Secondary | ICD-10-CM | POA: Diagnosis not present

## 2023-11-26 DIAGNOSIS — M898X7 Other specified disorders of bone, ankle and foot: Secondary | ICD-10-CM | POA: Diagnosis not present

## 2023-11-26 DIAGNOSIS — Z89421 Acquired absence of other right toe(s): Secondary | ICD-10-CM | POA: Diagnosis not present

## 2023-11-26 DIAGNOSIS — M7989 Other specified soft tissue disorders: Secondary | ICD-10-CM | POA: Diagnosis not present

## 2023-11-26 DIAGNOSIS — M86171 Other acute osteomyelitis, right ankle and foot: Secondary | ICD-10-CM | POA: Diagnosis not present

## 2023-11-26 DIAGNOSIS — L97419 Non-pressure chronic ulcer of right heel and midfoot with unspecified severity: Secondary | ICD-10-CM | POA: Diagnosis not present

## 2023-11-27 DIAGNOSIS — M86171 Other acute osteomyelitis, right ankle and foot: Secondary | ICD-10-CM | POA: Diagnosis not present

## 2023-11-27 DIAGNOSIS — I739 Peripheral vascular disease, unspecified: Secondary | ICD-10-CM | POA: Diagnosis not present

## 2023-11-27 DIAGNOSIS — M86271 Subacute osteomyelitis, right ankle and foot: Secondary | ICD-10-CM | POA: Diagnosis not present

## 2023-11-28 ENCOUNTER — Telehealth: Payer: Self-pay

## 2023-11-28 ENCOUNTER — Other Ambulatory Visit (HOSPITAL_COMMUNITY): Payer: Self-pay

## 2023-11-28 DIAGNOSIS — M86271 Subacute osteomyelitis, right ankle and foot: Secondary | ICD-10-CM | POA: Diagnosis not present

## 2023-11-28 DIAGNOSIS — M86171 Other acute osteomyelitis, right ankle and foot: Secondary | ICD-10-CM | POA: Diagnosis not present

## 2023-11-28 NOTE — Telephone Encounter (Signed)
 Called pt left message asking if I could schedule an appt to call back at [phone].

## 2023-11-29 DIAGNOSIS — M86171 Other acute osteomyelitis, right ankle and foot: Secondary | ICD-10-CM | POA: Diagnosis not present

## 2023-11-29 DIAGNOSIS — M86271 Subacute osteomyelitis, right ankle and foot: Secondary | ICD-10-CM | POA: Diagnosis not present

## 2023-11-30 DIAGNOSIS — M86271 Subacute osteomyelitis, right ankle and foot: Secondary | ICD-10-CM | POA: Diagnosis not present

## 2023-11-30 DIAGNOSIS — M86171 Other acute osteomyelitis, right ankle and foot: Secondary | ICD-10-CM | POA: Diagnosis not present

## 2023-12-01 DIAGNOSIS — M86271 Subacute osteomyelitis, right ankle and foot: Secondary | ICD-10-CM | POA: Diagnosis not present

## 2023-12-01 DIAGNOSIS — M86171 Other acute osteomyelitis, right ankle and foot: Secondary | ICD-10-CM | POA: Diagnosis not present

## 2023-12-02 DIAGNOSIS — M86271 Subacute osteomyelitis, right ankle and foot: Secondary | ICD-10-CM | POA: Diagnosis not present

## 2023-12-03 DIAGNOSIS — M86171 Other acute osteomyelitis, right ankle and foot: Secondary | ICD-10-CM | POA: Diagnosis not present

## 2023-12-04 DIAGNOSIS — Z21 Asymptomatic human immunodeficiency virus [HIV] infection status: Secondary | ICD-10-CM | POA: Diagnosis not present

## 2023-12-04 DIAGNOSIS — L97919 Non-pressure chronic ulcer of unspecified part of right lower leg with unspecified severity: Secondary | ICD-10-CM | POA: Diagnosis not present

## 2023-12-04 DIAGNOSIS — I70239 Atherosclerosis of native arteries of right leg with ulceration of unspecified site: Secondary | ICD-10-CM | POA: Diagnosis not present

## 2023-12-04 DIAGNOSIS — I70221 Atherosclerosis of native arteries of extremities with rest pain, right leg: Secondary | ICD-10-CM | POA: Diagnosis not present

## 2023-12-04 DIAGNOSIS — M86171 Other acute osteomyelitis, right ankle and foot: Secondary | ICD-10-CM | POA: Diagnosis not present

## 2023-12-04 DIAGNOSIS — M86271 Subacute osteomyelitis, right ankle and foot: Secondary | ICD-10-CM | POA: Diagnosis not present

## 2023-12-05 DIAGNOSIS — M86271 Subacute osteomyelitis, right ankle and foot: Secondary | ICD-10-CM | POA: Diagnosis not present

## 2023-12-05 DIAGNOSIS — M86171 Other acute osteomyelitis, right ankle and foot: Secondary | ICD-10-CM | POA: Diagnosis not present

## 2023-12-06 DIAGNOSIS — L97919 Non-pressure chronic ulcer of unspecified part of right lower leg with unspecified severity: Secondary | ICD-10-CM | POA: Diagnosis not present

## 2023-12-06 DIAGNOSIS — I70239 Atherosclerosis of native arteries of right leg with ulceration of unspecified site: Secondary | ICD-10-CM | POA: Diagnosis not present

## 2023-12-06 DIAGNOSIS — M86271 Subacute osteomyelitis, right ankle and foot: Secondary | ICD-10-CM | POA: Diagnosis not present

## 2023-12-06 DIAGNOSIS — M86171 Other acute osteomyelitis, right ankle and foot: Secondary | ICD-10-CM | POA: Diagnosis not present

## 2023-12-06 DIAGNOSIS — I10 Essential (primary) hypertension: Secondary | ICD-10-CM | POA: Diagnosis not present

## 2023-12-06 DIAGNOSIS — I70221 Atherosclerosis of native arteries of extremities with rest pain, right leg: Secondary | ICD-10-CM | POA: Diagnosis not present

## 2023-12-07 DIAGNOSIS — E1159 Type 2 diabetes mellitus with other circulatory complications: Secondary | ICD-10-CM | POA: Diagnosis not present

## 2023-12-07 DIAGNOSIS — E1165 Type 2 diabetes mellitus with hyperglycemia: Secondary | ICD-10-CM | POA: Diagnosis not present

## 2023-12-08 DIAGNOSIS — E1142 Type 2 diabetes mellitus with diabetic polyneuropathy: Secondary | ICD-10-CM | POA: Diagnosis not present

## 2023-12-08 DIAGNOSIS — M86171 Other acute osteomyelitis, right ankle and foot: Secondary | ICD-10-CM | POA: Diagnosis not present

## 2023-12-08 DIAGNOSIS — E1151 Type 2 diabetes mellitus with diabetic peripheral angiopathy without gangrene: Secondary | ICD-10-CM | POA: Diagnosis not present

## 2023-12-08 DIAGNOSIS — E1165 Type 2 diabetes mellitus with hyperglycemia: Secondary | ICD-10-CM | POA: Diagnosis not present

## 2023-12-09 DIAGNOSIS — S91301D Unspecified open wound, right foot, subsequent encounter: Secondary | ICD-10-CM | POA: Diagnosis not present

## 2023-12-11 DIAGNOSIS — E1151 Type 2 diabetes mellitus with diabetic peripheral angiopathy without gangrene: Secondary | ICD-10-CM | POA: Diagnosis not present

## 2023-12-11 DIAGNOSIS — M86171 Other acute osteomyelitis, right ankle and foot: Secondary | ICD-10-CM | POA: Diagnosis not present

## 2023-12-11 DIAGNOSIS — E1165 Type 2 diabetes mellitus with hyperglycemia: Secondary | ICD-10-CM | POA: Diagnosis not present

## 2023-12-11 DIAGNOSIS — E1142 Type 2 diabetes mellitus with diabetic polyneuropathy: Secondary | ICD-10-CM | POA: Diagnosis not present

## 2023-12-12 ENCOUNTER — Telehealth: Payer: Self-pay | Admitting: *Deleted

## 2023-12-12 DIAGNOSIS — M86171 Other acute osteomyelitis, right ankle and foot: Secondary | ICD-10-CM | POA: Diagnosis not present

## 2023-12-12 NOTE — Transitions of Care (Post Inpatient/ED Visit) (Signed)
   12/12/2023  Name: [name] MRN: [id number] DOB: [birth year]  Today's TOC FU Call Status: Today's TOC FU Call Status:: Unsuccessful Call (1st Attempt) Unsuccessful Call (1st Attempt) Date: 12/12/23  Attempted to reach the patient regarding the most recent Inpatient/ED visit.  Follow Up Plan: Additional outreach attempts will be made to reach the patient to complete the Transitions of Care (Post Inpatient/ED visit) call.   Andrea Dimes RN, BSN [place]  [facility] RN Care Manager [phone]

## 2023-12-13 ENCOUNTER — Telehealth: Payer: Self-pay | Admitting: *Deleted

## 2023-12-13 NOTE — Transitions of Care (Post Inpatient/ED Visit) (Signed)
   12/13/2023  Name: [name] MRN: [id number] DOB: [birth year]  Today's TOC FU Call Status: Today's TOC FU Call Status:: Unsuccessful Call (2nd Attempt) Unsuccessful Call (2nd Attempt) Date: 12/13/23  Attempted to reach the patient regarding the most recent Inpatient/ED visit.  Follow Up Plan: Additional outreach attempts will be made to reach the patient to complete the Transitions of Care (Post Inpatient/ED visit) call.   Andrea Dimes RN, BSN [place]  [facility] Health RN Care Manager [phone]

## 2023-12-14 DIAGNOSIS — M86171 Other acute osteomyelitis, right ankle and foot: Secondary | ICD-10-CM | POA: Diagnosis not present

## 2023-12-21 DIAGNOSIS — M86171 Other acute osteomyelitis, right ankle and foot: Secondary | ICD-10-CM | POA: Diagnosis not present

## 2023-12-23 DIAGNOSIS — Z419 Encounter for procedure for purposes other than remedying health state, unspecified: Secondary | ICD-10-CM | POA: Diagnosis not present

## 2023-12-27 ENCOUNTER — Telehealth: Payer: Self-pay

## 2023-12-27 NOTE — Telephone Encounter (Signed)
 Pt answered phone but hung with asked if could schedule an appointment.  Would not answer will called back.

## 2023-12-29 DIAGNOSIS — M86171 Other acute osteomyelitis, right ankle and foot: Secondary | ICD-10-CM | POA: Diagnosis not present

## 2024-01-01 DIAGNOSIS — E1142 Type 2 diabetes mellitus with diabetic polyneuropathy: Secondary | ICD-10-CM | POA: Diagnosis not present

## 2024-01-01 DIAGNOSIS — Z5900 Homelessness unspecified: Secondary | ICD-10-CM | POA: Diagnosis not present

## 2024-01-01 DIAGNOSIS — Z7985 Long-term (current) use of injectable non-insulin antidiabetic drugs: Secondary | ICD-10-CM | POA: Diagnosis not present

## 2024-01-01 DIAGNOSIS — M86671 Other chronic osteomyelitis, right ankle and foot: Secondary | ICD-10-CM | POA: Diagnosis not present

## 2024-01-01 DIAGNOSIS — Z5982 Transportation insecurity: Secondary | ICD-10-CM | POA: Diagnosis not present

## 2024-01-01 DIAGNOSIS — E1151 Type 2 diabetes mellitus with diabetic peripheral angiopathy without gangrene: Secondary | ICD-10-CM | POA: Diagnosis not present

## 2024-01-01 DIAGNOSIS — M86171 Other acute osteomyelitis, right ankle and foot: Secondary | ICD-10-CM | POA: Diagnosis not present

## 2024-01-01 DIAGNOSIS — Z9889 Other specified postprocedural states: Secondary | ICD-10-CM | POA: Diagnosis not present

## 2024-01-01 DIAGNOSIS — I779 Disorder of arteries and arterioles, unspecified: Secondary | ICD-10-CM | POA: Diagnosis not present

## 2024-01-01 DIAGNOSIS — L97512 Non-pressure chronic ulcer of other part of right foot with fat layer exposed: Secondary | ICD-10-CM | POA: Diagnosis not present

## 2024-01-01 DIAGNOSIS — E1169 Type 2 diabetes mellitus with other specified complication: Secondary | ICD-10-CM | POA: Diagnosis not present

## 2024-01-01 DIAGNOSIS — E11621 Type 2 diabetes mellitus with foot ulcer: Secondary | ICD-10-CM | POA: Diagnosis not present

## 2024-01-01 DIAGNOSIS — Z794 Long term (current) use of insulin: Secondary | ICD-10-CM | POA: Diagnosis not present

## 2024-01-04 ENCOUNTER — Ambulatory Visit (INDEPENDENT_AMBULATORY_CARE_PROVIDER_SITE_OTHER): Admitting: Primary Care

## 2024-01-04 ENCOUNTER — Telehealth: Payer: Self-pay

## 2024-01-04 ENCOUNTER — Telehealth (INDEPENDENT_AMBULATORY_CARE_PROVIDER_SITE_OTHER): Payer: Self-pay

## 2024-01-04 VITALS — BP 115/79 | HR 86 | Temp 98.2°F | Resp 16 | Ht 71.0 in | Wt 231.0 lb

## 2024-01-04 DIAGNOSIS — M868X7 Other osteomyelitis, ankle and foot: Secondary | ICD-10-CM | POA: Diagnosis not present

## 2024-01-04 DIAGNOSIS — Z09 Encounter for follow-up examination after completed treatment for conditions other than malignant neoplasm: Secondary | ICD-10-CM | POA: Diagnosis not present

## 2024-01-04 DIAGNOSIS — Z794 Long term (current) use of insulin: Secondary | ICD-10-CM

## 2024-01-04 DIAGNOSIS — I10 Essential (primary) hypertension: Secondary | ICD-10-CM

## 2024-01-04 DIAGNOSIS — E1169 Type 2 diabetes mellitus with other specified complication: Secondary | ICD-10-CM

## 2024-01-04 DIAGNOSIS — E1165 Type 2 diabetes mellitus with hyperglycemia: Secondary | ICD-10-CM

## 2024-01-04 DIAGNOSIS — L97519 Non-pressure chronic ulcer of other part of right foot with unspecified severity: Secondary | ICD-10-CM | POA: Diagnosis not present

## 2024-01-04 LAB — GLUCOSE, POCT (MANUAL RESULT ENTRY): POC Glucose: 303 mg/dL — AB (ref 70–99)

## 2024-01-04 NOTE — Progress Notes (Signed)
 Subjective:  Patient ID: [name], adult    DOB: 01-08-1971  Age: 53 y.o. MRN: [id number]  CC: Diabetes [name] is a Transgender male and identifies as a male.  She is also HIV positive.  She presents for Follow-up of diabetes. Patient does  check blood sugar at home HPI Very pleased to see [name] today have been concerned about her especially because she does not miss appointments with her other providers spoke with him and she was not keeping her appointments.  Called Dr. Fleeta raring a couple months ago he was out of the country and when he returned we will try to follow-up with her.  In the meantime JDM was able to find out she was hospitalized with osteo myelitis. The information she shared with me I asked could I speak with Dr. Fleeta Rothman in regards to the situation.  She is unemployed no income was staying with her sister which decided she would sell her house and move with her sister in [place].  This sisters became all of a sudden holey of them down and turned against [name] because of her sexuality and is allowed to stay with her sister which is a nurse until the completion of her IV antibiotics.  This makes her homeless.  If you have family turns there back on you and leaves you with nothing increases anxiety depression which she has no harms of hurting herself or others.  Dr. Fleeta Rothman asked to forward information to him and he will have his team to work on [facility] or [facility] in on housing.  I have not provided this information to her to get her hopes up and then disappoint her.  A1c is out of control 13.1 requested medication refills and will refer to [facility] to try to control her diabetes. Compliant with meds - No Checking CBGs? No  Fasting avg -   Postprandial average -  Exercising regularly? - No Watching carbohydrate intake? - No Neuropathy ? - No Hypoglycemic events - No  - Recovers with :   Pertinent ROS:  Polyuria - No Polydipsia - No Vision problems - No  Medications as  noted below. Taking them regularly without complication/adverse reaction being reported today.   History [name] has a past medical history of Anxiety, Asthma, Depression, Diabetes mellitus, Gait abnormality, GERD (gastroesophageal reflux disease), HIV disease (HCC), Hypertension, Loss of balance (09/14/2020), MRSA carrier, Perirectal abscess (03/30/2017), Pneumonia, Polyuria (04/27/2015), Poorly controlled diabetes mellitus (HCC) (04/27/2015), Testicular mass (06/20/2016), and Transgender (04/27/2015).   She has a past surgical history that includes Appendectomy and Transforaminal lumbar interbody fusion (tlif) with pedicle screw fixation 3 level (N/A, 01/09/2019).   Her family history includes Diabetes in her father; Heart failure in her father; Other in her mother.She reports that she has been smoking cigarettes. She started smoking about 32 years ago. She has a 2.7 pack-year smoking history. She has never used smokeless tobacco. She reports that she does not currently use drugs after having used the following drugs: Marijuana. She reports that she does not drink alcohol.  Current Outpatient Medications on File Prior to Visit  Medication Sig Dispense Refill   ACCU-CHEK SOFTCLIX LANCETS lancets 1 each by Other route 3 (three) times daily. 100 each 5   albuterol  (VENTOLIN  HFA) 108 (90 Base) MCG/ACT inhaler Inhale 2 puffs into the lungs every 6 (six) hours as needed for wheezing or shortness of breath. 6.7 g 0   blood glucose meter kit and supplies Dispense based on patient and  insurance preference. Use up to four times daily as directed. (FOR ICD-10 E10.9, E11.9). 1 each 0   Continuous Glucose Sensor (FREESTYLE LIBRE 3 SENSOR) MISC Place 1 sensor on the skin every 14 days. Use to check glucose continuously 2 each 6   docusate sodium  (COLACE CLEAR) 50 MG capsule Take 1 capsule (50 mg total) by mouth 2 times daily. 10 capsule 0   estradiol  (ESTRACE ) 2 MG tablet Take 1 tablet (2 mg total) by mouth daily. 30  tablet 11   FLUoxetine  (PROZAC ) 20 MG tablet Take 1 tablet (20 mg total) by mouth daily. 90 tablet 1   gabapentin  (NEURONTIN ) 300 MG capsule Take by mouth.     glipiZIDE  (GLUCOTROL  XL) 10 MG 24 hr tablet Take 1 tablet (10 mg total) by mouth daily with breakfast. 90 tablet 1   Lancet Devices (ONETOUCH DELICA PLUS LANCING) MISC Use as directed 1 each 0   metFORMIN  (GLUCOPHAGE ) 1000 MG tablet Take 1 tablet (1,000 mg total) by mouth 2 (two) times daily with a meal. 180 tablet 1   mirtazapine  (REMERON ) 15 MG tablet Take 1 tablet (15 mg total) by mouth at bedtime for sleep. 30 tablet 0   mupirocin  ointment (BACTROBAN ) 2 % Apply 1 Application topically daily. 22 g 0   oxyCODONE  (OXY IR/ROXICODONE ) 5 MG immediate release tablet Take 1 tablet (5 mg) by mouth every 4 hours as needed for severe pain. 30 tablet 0   PROAIR  HFA 108 (90 Base) MCG/ACT inhaler INHALE 2 PUFFS BY MOUTH INTO LUNGS EVERY 6 HOURS AS NEEDED FOR WHEEZING OR SHORTNESS OF BREATH 8.5 g 1   rosuvastatin  (CRESTOR ) 20 MG tablet Take 1 tablet (20 mg total) by mouth daily. 90 tablet 3   spironolactone  (ALDACTONE ) 100 MG tablet Take 1 tablet by mouth daily.     bictegravir-emtricitabine -tenofovir  AF (BIKTARVY ) 50-200-25 MG TABS tablet Take 1 tablet by mouth daily. (Patient not taking: Reported on 01/04/2024) 30 tablet 11   busPIRone  (BUSPAR ) 10 MG tablet Take 1 tablet (10 mg total) by mouth 2 (two) times daily for anxiety. (Patient not taking: Reported on 01/04/2024) 60 tablet 0   cariprazine  (VRAYLAR ) 1.5 MG capsule Take 1 capsule (1.5 mg total) by mouth daily. (Patient not taking: Reported on 01/04/2024) 30 capsule 0   Dulaglutide  (TRULICITY ) 3 MG/0.5ML SOAJ Inject 3 mg as directed once a week. (Patient not taking: Reported on 01/04/2024) 6 mL 1   No current facility-administered medications on file prior to visit.    Review of Systems Comprehensive ROS Pertinent positive and negative noted in HPI   Objective:  BP 115/79 (BP Location: Left  Arm, Patient Position: Sitting, Cuff Size: Large)   Pulse 86   Temp 98.2 F (36.8 C) (Oral)   Resp 16   Ht 5' 11 (1.803 m)   Wt 231 lb (104.8 kg)   SpO2 97%   BMI 32.22 kg/m   BP Readings from Last 3 Encounters:  01/04/24 115/79  02/02/23 (!) 144/94  01/18/23 137/89    Wt Readings from Last 3 Encounters:  01/04/24 231 lb (104.8 kg)  02/02/23 221 lb (100.2 kg)  01/18/23 217 lb 3.2 oz (98.5 kg)    Physical Exam Vitals reviewed.  Constitutional:      Appearance: Normal appearance. She is obese.  HENT:     Head: Normocephalic.     Right Ear: Tympanic membrane, ear canal and external ear normal.     Left Ear: Tympanic membrane, ear canal and external ear normal.  Nose: Nose normal.     Mouth/Throat:     Mouth: Mucous membranes are moist.  Eyes:     Extraocular Movements: Extraocular movements intact.     Pupils: Pupils are equal, round, and reactive to light.  Cardiovascular:     Rate and Rhythm: Normal rate.  Pulmonary:     Effort: Pulmonary effort is normal.     Breath sounds: Normal breath sounds.  Abdominal:     General: Bowel sounds are normal.     Palpations: Abdomen is soft.  Musculoskeletal:        General: Normal range of motion.     Cervical back: Normal range of motion.  Skin:    General: Skin is warm and dry.  Neurological:     Mental Status: She is alert and oriented to person, place, and time.  Psychiatric:        Mood and Affect: Mood normal.        Behavior: Behavior normal.        Thought Content: Thought content normal.     Lab Results  Component Value Date   HGBA1C 8.0 (A) 02/03/2023   HGBA1C 7.0 11/03/2022   HGBA1C 7.5 (H) 11/02/2022    Lab Results  Component Value Date   WBC 10.5 02/02/2023   HGB 15.7 02/02/2023   HCT 46.5 02/02/2023   PLT 301 02/02/2023   GLUCOSE 228 (H) 02/02/2023   CHOL 120 02/02/2023   TRIG 235 (H) 02/02/2023   HDL 32 (L) 02/02/2023   LDLCALC 59 02/02/2023   ALT 24 02/02/2023   AST 19 02/02/2023    NA 138 02/02/2023   K 3.9 02/02/2023   CL 102 02/02/2023   CREATININE 0.74 02/02/2023   BUN 21 02/02/2023   CO2 26 02/02/2023   TSH 1.090 12/06/2017   INR 1.1 08/27/2019   HGBA1C 8.0 (A) 02/03/2023   MICROALBUR 2.50 (H) 07/20/2011    Title   Diabetic Foot Exam - detailed    Semmes-Weinstein Monofilament Test + means has sensation and - means no sensation      Image components are not supported.   Image components are not supported. Image components are not supported.  [place] Comments      Assessment & Plan:  Antwaun Buth was seen today for diabetes.  Diagnoses and all orders for this visit:  Uncontrolled type 2 diabetes mellitus with hyperglycemia, with long-term current use of insulin  (HCC) -     Glucose (CBG) -     Microalbumin / creatinine urine ratio  Essential hypertension   Osteomyelitis Acute osteomyelitis of the fifth metatarsal and fifth proximal phalanx and septic arthritis of the fifth MTP joint as above.  Treatment with IV antibiotics sister is administering medication  Hospital discharge follow-up  Recommended to follow-up with PCP and infectious disease Recommending follow-up with wound care/podiatry recommended on discharge Which has been arranged on discharge recommended on discharge 3 times weekly wound VAC changes at home Monday Wednesday and Friday.  This will be impossible homeless in an adequate treatment  Follow-up:  [name] first available  The above assessment and management plan was discussed with the patient. The patient verbalized understanding of and has agreed to the management plan. Patient is aware to call the clinic if symptoms fail to improve or worsen. Patient is aware when to return to the clinic for a follow-up visit. Patient educated on when it is appropriate to go to the emergency department.   Rosaline Bohr, NP-C

## 2024-01-04 NOTE — Telephone Encounter (Signed)
 I spoke to the patient while she was at her appointment with Rosaline Bohr, NP today. She explained that she is currently staying with a sister who is an Charity fundraiser is administering her IV antibiotics for her.  The medication is scheduled to finish 01/12/2024 and the patient said her sister will be kicking her out of her home.  She said she has nowhere to go.  She has no income at this time. I told her that I can refer her to Aloysius Ford, RN/ Congregational Nurse at [facility] for possible guidance as well as refer her to [facility] for resources and she was in agreement.  I confirmed her phone number that is in Epic,   She currently has no income and is not able to pay for housing.  She stated she has applied for disability and has been denied twice. She is currently working with attorneys in [place]  and is waiting for a date for a phone interview with a judge. I explained that I can't refer her to [facility] or [facility] [place] for assistance with this disability as she is currently working with attorneys and she said she understood.

## 2024-01-04 NOTE — Telephone Encounter (Signed)
 Copied from CRM [id number]. Topic: Clinical - Prescription Issue >> Jan 04, 2024  4:21 PM Ivette P wrote: Reason for CRM: pt called in because during visit was told would get new prescription and has not heard anything from pharmacy.    Pt would like medication to go to  [facility] Address: [address], [place], [place] [address] Phone: [phone]

## 2024-01-05 ENCOUNTER — Ambulatory Visit: Payer: Self-pay | Admitting: Primary Care

## 2024-01-05 ENCOUNTER — Telehealth: Payer: Self-pay | Admitting: *Deleted

## 2024-01-05 ENCOUNTER — Ambulatory Visit: Payer: Self-pay

## 2024-01-05 LAB — MICROALBUMIN / CREATININE URINE RATIO
Creatinine, Urine: 77 mg/dL
Microalb/Creat Ratio: 178 mg/g{creat} — ABNORMAL HIGH (ref 0–29)
Microalbumin, Urine: 136.7 ug/mL

## 2024-01-05 NOTE — Telephone Encounter (Signed)
 FYI Only or Action Required?: Action required by provider: medication refill request.  Patient was last seen in primary care on 01/04/2024 by Celestia Rosaline SQUIBB, NP.  Called Nurse Triage reporting Advice Only.  Symptoms began today.  Interventions attempted: Other: pt calling to follow up on medications ordered by PCP.  Symptoms are: unchanged.  Triage Disposition: No disposition on file.  Patient/caregiver understands and will follow disposition?:       Pt called due to seeing the PCP yesterday regarding concerns about diabetes: pt stated PCP was going to order medications but none at pharmacy.  Pt needs all diabetes and cholesterol asap Reason for Disposition  [1] Caller requesting NON-URGENT health information AND [2] PCP's office is the best resource    Routing information to PCP office: Instructed pt someone would give her a call back about the medication.  Answer Assessment - Initial Assessment Questions 1. REASON FOR CALL: What is the main reason for your call? or How can I best help you?    Pt called due to seeing the PCP yesterday regarding concerns about diabetes: pt stated PCP was going to order medications but none at pharmacy.  Pt needs all diabetes and cholesterol medications asap.  Please call patient with update on medications.  Protocols used: Information Only Call - No Triage-A-AH

## 2024-01-05 NOTE — Progress Notes (Signed)
 Complex Care Management Note Care Guide Note  01/05/2024 Name: [name] MRN: [id number] DOB: 1971/01/31   Complex Care Management Outreach Attempts: An unsuccessful telephone outreach was attempted today to offer the patient information about available complex care management services.  Follow Up Plan:  Additional outreach attempts will be made to offer the patient complex care management information and services.   Encounter Outcome:  No Answer  Thedford Franks, CMA [place]  [facility], [facility] Guide Direct Dial: [phone]  Fax: [phone] Website: [email or web address]

## 2024-01-05 NOTE — Telephone Encounter (Signed)
 The patient called back in stating she still has not heard from anyone about her medication. She has been needing her rosuvastatin  (CRESTOR ) 20 MG tablet , glipiZIDE  (GLUCOTROL  XL) 10 MG 24 hr tablet and Dulaglutide  (TRULICITY ) 3 MG/0.5ML SOAJ .to be sent to   [facility] - [place], [place] - [address] Phone: [phone]  Fax: [phone]   Please assist patient further as she recently got out of [facility] and saw her provider yesterday in which she thought her meds were going to be called in but never were.

## 2024-01-06 DIAGNOSIS — M86171 Other acute osteomyelitis, right ankle and foot: Secondary | ICD-10-CM | POA: Diagnosis not present

## 2024-01-07 ENCOUNTER — Other Ambulatory Visit: Payer: Self-pay | Admitting: Primary Care

## 2024-01-07 DIAGNOSIS — E1142 Type 2 diabetes mellitus with diabetic polyneuropathy: Secondary | ICD-10-CM

## 2024-01-07 MED ORDER — GLIPIZIDE ER 10 MG PO TB24
10.0000 mg | ORAL_TABLET | Freq: Every day | ORAL | 1 refills | Status: AC
  Filled 2024-01-07: qty 90, 90d supply, fill #0

## 2024-01-07 MED ORDER — ROSUVASTATIN CALCIUM 20 MG PO TABS
20.0000 mg | ORAL_TABLET | Freq: Every day | ORAL | 0 refills | Status: AC
  Filled 2024-01-07: qty 90, 90d supply, fill #0

## 2024-01-07 MED ORDER — TRULICITY 3 MG/0.5ML ~~LOC~~ SOAJ
3.0000 mg | SUBCUTANEOUS | 1 refills | Status: DC
  Filled 2024-01-07: qty 2, 28d supply, fill #0

## 2024-01-08 ENCOUNTER — Other Ambulatory Visit: Payer: Self-pay

## 2024-01-08 ENCOUNTER — Telehealth: Payer: Self-pay

## 2024-01-08 DIAGNOSIS — B20 Human immunodeficiency virus [HIV] disease: Secondary | ICD-10-CM

## 2024-01-08 NOTE — Telephone Encounter (Signed)
 Call placed to patient. Unable to leave voicemail as mailbox is full.   Letter has already been mailed to address on file.

## 2024-01-08 NOTE — Telephone Encounter (Signed)
-----   Message from Jomarie [place] sent at 01/08/2024  9:39 AM EDT ----- Can we connect Harlene to [facility] or [facility] to help with housing (she will be homeless soon) ----- Message ----- From: Celestia Rosaline SQUIBB, NP Sent: 01/08/2024   1:00 AM EDT To: Jomarie LOISE Sal[place] Kathie, MD  [place]

## 2024-01-08 NOTE — Telephone Encounter (Signed)
 Attempted to call back pt. No answer. Unable to leave voicemail due to mailbox being full.

## 2024-01-08 NOTE — Telephone Encounter (Signed)
 Referral placed to [facility] for housing. Called Harlene and discussed, she requested [facility]'s number be sent to her via MyChart.   [name], BSN, RN

## 2024-01-08 NOTE — Procedures (Signed)
 Post-op shoe applied as ordered by Dr Autry ,of wound care visit applied to patient right foot.

## 2024-01-08 NOTE — Procedures (Signed)
 Procedures   Post -op shoe applied to patient right foot as ordered by provider.

## 2024-01-08 NOTE — Progress Notes (Signed)
 Complex Care Management Note  Care Guide Note 01/08/2024 Name: WILLIARD KELLER MRN: [id number] DOB: March 06, 1971  CLEO VILLAMIZAR is a 53 y.o. year old adult who sees Celestia Rosaline SQUIBB, NP for primary care. I reached out to Elspeth DELENA Sluder by phone today to offer complex care management services.  Ms. Fearnow was given information about Complex Care Management services today including:   The Complex Care Management services include support from the care team which includes your Nurse Care Manager, Clinical Social Worker, or Pharmacist.  The Complex Care Management team is here to help remove barriers to the health concerns and goals most important to you. Complex Care Management services are voluntary, and the patient may decline or stop services at any time by request to their care team member.   Complex Care Management Consent Status: Patient agreed to services and verbal consent obtained.   Follow up plan:  Telephone appointment with complex care management team member scheduled for:  01/11/2024  Encounter Outcome:  Patient Scheduled  Thedford Franks, CMA [place]  [facility], [facility] Guide Direct Dial: [phone]  Fax: [phone] Website: [email or web address]

## 2024-01-09 ENCOUNTER — Other Ambulatory Visit: Payer: Self-pay

## 2024-01-09 ENCOUNTER — Other Ambulatory Visit (HOSPITAL_BASED_OUTPATIENT_CLINIC_OR_DEPARTMENT_OTHER): Payer: Self-pay

## 2024-01-09 DIAGNOSIS — M86171 Other acute osteomyelitis, right ankle and foot: Secondary | ICD-10-CM | POA: Diagnosis not present

## 2024-01-09 NOTE — Telephone Encounter (Signed)
 Attempted to call patient but unable to leave a message due to voice mailbox being full.   Confirmed with [facility]  [employer] that gauze and tape was shipped to patient on 7/24 and delivered on 7/25.

## 2024-01-09 NOTE — Progress Notes (Signed)
 [facility] has been providing [name] with daptomycin and ceftriaxone . Per our records, end date is 01/12/24. We have dispensed full course (to end 01/12/24).  Please confirm end date. Should therapy need to be continued for any reason, please notify us  and patient of this plan. BIP will need new orders with as much advanced notice as possible to prevent lapse in therapy.   I confirmed with patient today that [facility] is planning on removing PICC line after last dose of antibiotics.  Please let us  know of any change to this plan.    Thank you, Amy McFerrin, [facility] 01/09/2024 9:19 AM Infusion Pharmacist

## 2024-01-10 NOTE — Telephone Encounter (Signed)
 Communication placed to home health requesting results from [facility] weekly safety labs. Provided [facility] fax number.

## 2024-01-10 NOTE — Telephone Encounter (Signed)
 Weekly safety lab results received via fax, entered and uploaded to EMR for review.

## 2024-01-10 NOTE — Telephone Encounter (Signed)
 She was ID visit no show on 01/05/24 with EOT on 01/12/24.  Reviewed labs from 01/09/24.  CMP notable for: serum creatinine stable, hepatic function stable, and electrolytes stable  CBC notable for : no leukocytosis or leukopenia and platelets stable   Inflammatory markers:  CRP 0.5, ESR 24 (normalized) CK:76    No ID intervention

## 2024-01-11 ENCOUNTER — Other Ambulatory Visit: Payer: Self-pay

## 2024-01-11 DIAGNOSIS — Z9889 Other specified postprocedural states: Secondary | ICD-10-CM | POA: Diagnosis not present

## 2024-01-11 DIAGNOSIS — E1142 Type 2 diabetes mellitus with diabetic polyneuropathy: Secondary | ICD-10-CM | POA: Diagnosis not present

## 2024-01-11 DIAGNOSIS — Z7985 Long-term (current) use of injectable non-insulin antidiabetic drugs: Secondary | ICD-10-CM | POA: Diagnosis not present

## 2024-01-11 DIAGNOSIS — Z5900 Homelessness unspecified: Secondary | ICD-10-CM | POA: Diagnosis not present

## 2024-01-11 DIAGNOSIS — M86671 Other chronic osteomyelitis, right ankle and foot: Secondary | ICD-10-CM | POA: Diagnosis not present

## 2024-01-11 DIAGNOSIS — L97512 Non-pressure chronic ulcer of other part of right foot with fat layer exposed: Secondary | ICD-10-CM | POA: Diagnosis not present

## 2024-01-11 DIAGNOSIS — E11621 Type 2 diabetes mellitus with foot ulcer: Secondary | ICD-10-CM | POA: Diagnosis not present

## 2024-01-11 DIAGNOSIS — E1151 Type 2 diabetes mellitus with diabetic peripheral angiopathy without gangrene: Secondary | ICD-10-CM | POA: Diagnosis not present

## 2024-01-11 DIAGNOSIS — Z5982 Transportation insecurity: Secondary | ICD-10-CM | POA: Diagnosis not present

## 2024-01-11 DIAGNOSIS — Z794 Long term (current) use of insulin: Secondary | ICD-10-CM | POA: Diagnosis not present

## 2024-01-11 NOTE — Patient Outreach (Signed)
 BSW attempted initial call attempt but was unsuccessful. Patient VM was full and no option to leave VM was available. BSW will have care guide reschedule.   Laymon Doll, BSW [place]/[facility] - [employer] Social Worker [phone]

## 2024-01-11 NOTE — Progress Notes (Signed)
 [name]  WOUND CARE CONSULTATION REPORT - OUTPATIENT CLINIC    Referred by: [name]*    Chief Complaint:  Right diabetic foot ulcer    Background obtained on 02/24/2022:   Relevant past medical history includes: Patient Active Problem List   Diagnosis Date Noted   . Encounter for education about infusion care 12/11/2023  . Osteomyelitis    (CMD) 11/28/2023  . Acute osteomyelitis of right foot    (CMD) 11/26/2023  . MRSA pneumonia    (CMD) 09/13/2022  . Human immunodeficiency virus (HIV) disease (HCC) 07/21/2022  . Hypertension   . HIV positive (HCC)   . Diabetes mellitus (HCC)   . Asthma   . GERD (gastroesophageal reflux disease)   . Pyogenic inflammation of bone (HCC) 02/04/2022  . Ischemic stroke    (CMD) 10/31/2017  . Smoker 03/07/2011    Overview Note:    Last Assessment & Plan:   Will refill 4 weeks of maintenance pack x 1. I told [name] that she needs to stop smoking all together and the recommended duration of therapy is 12 weeks total. I congratulated [name] for [name] good work.   . Hidradenitis suppurativa 04/14/2006    Overview Note:    Qualifier: Diagnosis of  By: Lang MD, [name]     Resolved Problems   Diagnosis Date Noted Date Resolved  . Pneumonia due to other virus not elsewhere classified 09/10/2022 09/14/2022     Relevant work up/results:  Perfusion study:           Lab Results  Component Value Date   HGBA1C 12.4 (H) 06/23/2022   HGBA1C  06/23/2022             HGBA1C 12.0 (H) 02/04/2022   HGBA1C  02/04/2022     Comment:        ALBUMIN 3.9 07/14/2022   ALBUMIN 4.0 06/23/2022   ALBUMIN 3.6 02/04/2022   ALBUMIN 4.2 08/08/2020   BNP 27.0 08/08/2020   PLT 278 08/15/2022   PLT 298 08/08/2022   PLT 341 08/01/2022   PLT 340 07/14/2022   WBC 7.9 08/15/2022   WBC 6.5 08/08/2022   WBC 8.8 08/01/2022   WBC 9.0 07/14/2022    Imaging studies:    X-RAY RIGHT  FOOT (3+ VIEWS), 02/04/2022 10:47 PM     INDICATION: foot wound \ Z51.89 Visit for wound check   COMPARISON: None.     CONCLUSION:  1.  Soft tissue/ulcer overlying the lateral soft tissues at the level of the fifth PIP joint. There is associated erosions of the head of the fifth metatarsal and base of the fifth proximal phalanx, concerning for osteomyelitis.  2.  No acute fracture or joint malalignment.  3.  Mild soft tissue swelling about the forefoot.       MR RIGHT FOOT WITH AND WITHOUT CONTRAST (INFECTION PROTOCOL), 02/06/2022 7:00 AM     INDICATION: Osteomyelitis suspected, diabetic \ M86.171 Other acute osteomyelitis of right foot (HCC)      CONCLUSION:  Acute osteomyelitis of the fifth metatarsal and fifth proximal phalanx and septic arthritis of the fifth MTP joint as above.       X-RAY RIGHT FOOT (3 + VIEWS, PORTABLE), 02/08/2022 4:30 PM     INDICATION: post-op \ M86.171 Other acute osteomyelitis of right foot (HCC)   COMPARISON: MRI right foot 02/06/2022, Right foot x-ray 02/04/2022     CONCLUSION:  1.  Expected postoperative day  0 changes status post transmetatarsal amputation of the fifth ray for treatment of fifth MTP septic arthritis and osteomyelitis. No unexpected acute findings.  2.  Moderate osteoarthritis of the first MTP joint. Mild osteoarthritis of the ankle, midfoot, second MTP, and all of the interphalangeal joints.  3.  Osteopenia.  4.  No acute fracture or malalignment      Interval history/progress:    Well summarized HPI obtained from patient's hospital discharge summary on 02/16/2022 by Dr. Milly,       Hospital Course:   For full details, please see H&P, progress notes, consult notes and ancillary notes. Briefly, [name] is a 53 year old male with PMHx of HIV (on Biktarvy , last CD4> 1000 per patient), type II diabetes, and HTN who presented to  [facility] ED for evaluation of a lateral right foot wound.  Patient hemodynamically stable and  afebrile in the ED.  Initial laboratory work-up significant for white count 10.1 with elevated ESR/CRP.  Initial x-ray of the right foot indicated soft  tissue/ulcer overlying the lateral soft tissues at the level of the fifth PIP joint with associated erosions of the head of the fifth metatarsal and base of the fifth proximal phalanx concerning for osteomyelitis.  Follow-up MRI on 8/27 indicated acute  osteomyelitis of the fifth metatarsal and fifth proximal phalanx and septic arthritis of the fifth MTP joint.      Podiatry was consulted; recommended proceeding with fifth ray partial amputation.  Patient underwent surgery on 8/29 and tolerated well.  Wound culture from the OR indicated MRSA.  ID was consulted for further antibiotic recommendations.  ID recommended  daptomycin 8 mg/kg every 24 hours for 6 weeks of therapy with the tentative end date of 03/22/2022.  PICC line placed while inpatient for further antibiotic therapy as an outpatient.     Patient's course complicated by poorly controlled diabetes.  Patient had previously been on insulin , but was currently managed on oral antihyperglycemics.  Patient transitioned to insulin  while inpatient, and diabetic teaching was performed.  Patient's  blood sugars better controlled on insulin .     Social work was engaged as patient wishing to return home with sister in [place]  as patient's sister is an Charity fundraiser and would be able to help with infusions.  After much hard work, home health company located in patient able to receive regular lab draws and  home teaching in [place]  while OPAT follows.     On 9/6, patient clinically stable and without complaints.  Patient evaluated personally by attending physician and deemed appropriate for discharge.  All discharge medicines, return precautions, follow-up appointments were discussed with patient.  Patient  voiced understanding and agreement.         Discharge Follow-up Action Items:  1. Follow-up with PCP in 1 to 2  weeks upon discharge for posthospital follow-up.  Recommend management of patient's chronic medical conditions and further healthcare maintenance as indicated.  Recommend  routine close follow-up given poorly controlled diabetes and recent amputation secondary to diabetic foot wound.  Patient aware to check blood sugars regularly and diabetic teaching performed upon discharge; insulin  regimen recommended as below --recommend  outpatient titration as needed.  2.  Please follow-up with infectious disease and OPAT as scheduled.  Continue antibiotics as detailed in med reconciliation below.  Tentative end date of antibiotics on 03/22/2022.  3.  Please follow-up with HIV clinic in [place] at [facility] in 2 to 4 weeks for further management of HIV given intermittent noncompliance with Biktarvy .  Referral placed. Patient aware.  4.  Please follow-up with wound care / podiatry for further management of wound.  Podiatry has arranged on discharge. Podiatry recommending thrice weekly wound VAC changes at home (Monday,  Wednesday, Friday)...          02/24/2022: Initial visit in our [facility].  Patient reports doing well. Denies any fevers and/or chills at today's visit.     03/17/2022: Patient doing well with wound VAC.  Is getting changed by [name] sister who is Charity fundraiser as well.  No new concerns except a couple small areas  of breakdown lateral aspect of the foot.  Probably combination between pressure from the wound VAC tubing and the fracture boot    Patient has never been referred for diabetic orthotic shoes evaluation            Please see prescription for diabetic orthotic shoes on the letters tab 03/17/2022    03/24/2022:  Doing very well, wound vac is on hold    03/31/2022:  Awaiting for diabetic orthotic shoe fitting. Callous and small wound debrided in clinic, overall healing well.     04/07/2022: Doing well with soft cast 1 remaining small wound lateral border of the foot        04/14/2022: Doing  well no new concerns          Okay to stop the soft cast.  Transition to either Xeroform or Vaseline gauze  Change dressings daily    04/29/2022:  Patient is having some difficulty reaching his foot.  Only change dressings every other day        05/27/2022:  Patient seen last in a month ago.  Returns with much worse large ulceration right lateral foot.  She is due to get orthotic shoes only at the end of the month and 2 more weeks  There has increased pain there is increased redness around lateral aspect of the foot  Not aware of anything that could have caused injury but this obvious pressure and shearing        Wound culture ID  3+ Streptococci, beta hemolytic group B Abnormal                1+ Klebsiella oxytoca Abnormal                      06/03/2022,  Patient using wet-to-dry twice a day.  Reports still discomfort pain and redness.  Reports Doxy not working  Not aware that prescription for Amoxil  was called in 4 days ago    06/10/22  Patient has been taking Augmentin . Has a few days left. Has obtained rx diabetic shoes. Displeased but willing to do TCC again today.     06/16/2022:  Antibiotics are finished.  Infection control.  Continues TCC.  No new issues          06/23/2022   Frustrated by this wound   - have suspicion of worsening infection despite recent courses of augmentin  and doxycycline . Wound culture repeated today    08/11/2022:  Since seen last time in the wound center patient was taken to the OR per podiatry and had excision  Currently on IV antibiotics followed by ID antibiotics plan 08/26/2022 plan wound is improving  Ambulating with a surgical shoes with peg assist  No fevers no chills.  Happy with progress  Blood sugars under control         Clinically improving very well  Continue collagen  Make sure to  get some border dressings  Wound is mostly lateral aspect of the foot so he should probably be able to do adequate offloading using just  diabetic orthotic shoes  Meanwhile okay to use Pegasys shoe.  08/18/22 Since last visit, patient has been using collagen dressings cover or border and ambulating with orthotic shoes Still on IV antibiotics although close to finishing. The wound is noticed to be fairly unchanged today with undermining and rolled edges consistent with shearing. At this time we have discussed HBO as a therapeutic intervention and adjuvant treatment for [name] diabetic foot ulceration and also recurrent refractory osteomyelitis  Quick summary: Patient currently treated for diabetic foot ulcer right foot. This started approximately August 2023. Patient was admitted in August for septic arthritis and fifth MT resection.  Subsequently treated with IV antibiotics. She was almost completely healed in December 2023 and she experienced a recurrence with worsening infection. Subsequently hospitalized again in January 2024 and recurrent osteomyelitis was noticed per imaging x-ray results. Patient was taken to the OR for debridement last 1 was 07/05/2022 positive for GBS Has been on IV antibiotics since.  Discussed HBO as adjuvant treatment beside wound treatments debridement IV antibiotics. Patient has a double indication both as a Wagner 3 DFU and also as a refractory/recurrent osteomyelitis  HBO therapy for DFU checklist: HBO orders: number of treatments: 30 sessions x 90 minutes each number or treatments/day: daily 5 times/week (Monday - Friday) treatment depth: 2.5 ATA air breaks: 2 air breaks/ treatment x 5 minutes each. glycemic protocol for HBO  09/01/2022: Doing well no new concerns there has been some maceration in the cast.  Approved for HBO ready to start Has not had a chance to get the chest x-ray yet  01/30/2023: Ms. Blansett returns for evaluation right lateral foot ulcer.  Lost to follow-up since March due to hospitalization for pneumonia.  Foot healed as she was mostly nonweightbearing and then transition  to using diabetic orthotic shoes. Glycemic control much improved most recent A1c 7.5 in May. Has not seen podiatry or orthotist lately.   01/01/2024: Patient returns for recurrent right lateral foot.  Well-known to us  from multiple previous The care as noticed above.  Patient reports was able to get fitted for diabetic orthotic shoes but thinks that they are not properly fitted. Nonetheless admission noticed mid June with infected wound with uncontrolled diabetes hemoglobin A1c 13. Osteomyelitis was confirmed on the MRI. Perfusion deficit was also noticed on arterial studies Patient was taken to the operating room and had revascularization including common femoral artery to below the knee popliteal with GSV. She was seen by podiatry cultures obtained and discharged on antibiotics including daptomycin and ceftriaxone  with end of date 01/12/2024 Patient is homeless but she can live with assistance [place] .  Having difficulty with transportation.  Ambulating using regular shoes. Nonetheless reports wound is improving.  There has been some increased swelling and especially right lower extremity  01/11/2024: The wound is better.  Was able to get some supplies. Got established with PCP.  Continues to have ID follow-up. Patient will live with [name] sister in [place]  for 2 more days and then not sure with she will go..  Probably back under the bridge in [place] Unable to get to [name] diabetic orthotic shoes.  They are in [place] and unable to go get them.  However they do not fit because of foot tends to get twisted sidewalks with ambulation.   Vitals:   01/11/24 0809  BP: 127/77  Pulse: 102  Temp: 97.9 F (36.6 C)    Exam:  Pt is AA in NAD. Oriented Wound(s) exam: Right lateral foot: Dime sized ulceration with fibrotic tissue and some crusting but some decrease in size is noticed since last week.  No signs of infection.  Complete excisional debridement down to a reasonable healthy wound  bed in less than a square centimeter size overall.  No vital structures exposed  Wound 02/08/22 Diabetic Ulcer Foot Anterior;Right;Lateral (Active)  Date First Assessed: 02/08/22   Pre-Existing Wound: Yes  Primary Wound Type: Diabetic Ulcer  Location: Foot  Wound Location Orientation: Anterior;Right;Lateral    Assessments 08/02/2022 11:11 AM 01/11/2024  8:13 AM  Wound Image     Site Assessment -- Red  Peri-Wound Assessment -- Hemosiderin staining;Dry  Diabetic Ulcer Wagner Grade Grade 3 --  Drainage Amount -- Small  Drainage Description -- Serosanguineous  Wound Odor -- None  Wound Length (cm) 2 cm 2 cm  Wound Width (cm) 2.5 cm 0.7 cm  Wound Surface Area (cm^2) 5 cm^2 --  Wound Depth (cm) 0.5 cm 0.2 cm  Wound Volume (cm^3) 2.5 cm^3 0.147 cm^3  Wound Healing % -- 94  Non-staged Wound Description -- Full thickness     Active Orders  Date Order Priority Status Authorizing Provider  01/11/24 [phone] Wound Care Diabetic Ulcer Anterior;Right;Lateral Foot Routine Active Patriciaann Aloha Carmel, MD    - Wound cleansing::    Other (Specify) (soap and water)    - Dressing change frequency::    Change dressing every day    - Primary wound dressing::    Other (Specify) (vaseline gauze)    - Secondary wound dressing::    4x4 Gauze    - Secure with::    Tape    - Off-loading::    PEG assist insert    - Additional Orders/Instructions::    Follow nutritous diet  01/11/24 0828 Debridement Diabetic Ulcer Anterior;Right;Lateral Foot Routine Active Patriciaann Aloha Carmel, MD  01/01/24 1049 Wound Care Diabetic Ulcer Anterior;Right;Lateral Foot Routine Active Patriciaann Aloha Carmel, MD    - Wound cleansing::    Other (Specify) (soap and water)    - Dressing change frequency::    Change dressing every day    - Primary wound dressing::    Calcium  alginate with silver    - Secondary wound dressing::    Other (Specify) (Bordered Foam)    - Edema control::    Edema wear color blue    - Additional Orders/Instructions::     Follow nutritous diet    - Follow Up Appointment::    Return to clinic in (on 7/31)  08/25/22 1443 Wound Care Diabetic Ulcer Anterior;Right;Lateral Foot Routine Active Patriciaann Aloha Carmel, MD    - Wound cleansing::    Cleanse wound with Anasept or Dakin's 1/4 strength    - Dressing change frequency::    Do not change dressing for an entire week    - Primary wound dressing::    Prisma    - Additional Orders/Instructions::    Follow nutritous diet    - Follow Up Appointment::    Return to clinic in 1 week     Inactive Orders  Date Order Priority Status Authorizing Provider  01/01/24 1001 Debridement Diabetic Ulcer Anterior;Right;Lateral Foot Routine Completed Patriciaann Aloha Carmel, MD  01/30/23 1335 Debridement Diabetic Ulcer Anterior;Right;Lateral Foot Routine Completed Patriciaann Aloha Carmel, MD  09/12/22 0225 Wound care Routine Discontinued Elsie Bennett Darryl Raddle., MD    - Dressing type::    Dry  09/10/22 0923 Wound care Routine Discontinued Dena Dorothyann Boga, PA-C    - Dressing type::    Dry  09/01/22 1445 Debridement Diabetic Ulcer Anterior;Right;Lateral Foot Routine Completed Patriciaann Aloha Carmel, MD  08/25/22 1419 Debridement Diabetic Ulcer Anterior;Right;Lateral Foot Routine Completed Patriciaann Aloha Carmel, MD  08/18/22 1507 Wound Care Diabetic Ulcer Anterior;Right;Lateral Foot Routine Discontinued Patriciaann Aloha Carmel, MD    - Wound cleansing::    Other (Specify) (soap and water)    - Dressing change frequency::    Change dressing every other day    - Secondary wound dressing::    Other (Specify) (Mepilex or Optifoam)    - Follow Up Appointment::    Return to clinic in 1 week  08/18/22 1415 Debridement Diabetic Ulcer Anterior;Right;Lateral Foot Routine Completed Patriciaann Aloha Carmel, MD     Wound 11/29/23 Perineum Anterior;Right (Active)  Date First Assessed/Time First Assessed: 11/29/23 1424   Pre-Existing Wound: No  Location: Perineum  Wound Location Orientation:  Anterior;Right    Assessments 11/29/2023  2:40 PM 12/11/2023  7:18 AM  Wound Image     Site Assessment -- Clean;Dry;Intact  Peri-Wound Assessment -- Clean;Dry;Intact  Edges -- Approximated     Inactive Orders  Date Order Priority Status Authorizing Provider  11/29/23 1638 Wound/Ostomy eval and treat 2 Wounds Associated Routine Completed Kinchit MARLA Fairly, MD    - Reason for consult::    Topical wound treatment recommendation     Wound 11/29/23 Erythema Groin Posterior;Proximal;Right (Active)  Date First Assessed/Time First Assessed: 11/29/23 1424   Primary Wound Type: Erythema  Location: Groin  Wound Location Orientation: Posterior;Proximal;Right  Wound Description (Comments): red oval shaped lesion, scant oozing    Assessments 11/29/2023  8:45 PM 12/11/2023  7:18 AM  Site Assessment Unable to assess Clean;Dry;Intact  Peri-Wound Assessment Unable to assess Clean;Dry;Intact  Edges Unable to assess --  Closure Unable to assess --  Drainage Amount Unable to assess --  Drainage Description Unable to assess --  Wound Odor None None  Dressing Open to air Topical skin adhesive  Dressing Status -- Clean;Dry;Intact     Inactive Orders  Date Order Priority Status Authorizing Provider  11/29/23 1638 Wound/Ostomy eval and treat 2 Wounds Associated Routine Completed Kinchit MARLA Fairly, MD    - Reason for consult::    Topical wound treatment recommendation     Wound 12/04/23 Incision Groin Anterior;Left (Active)  Date First Assessed/Time First Assessed: 12/04/23 1705   Pre-Existing Wound: No  Primary Wound Type: Incision  Location: Groin  Wound Location Orientation: Anterior;Left  Wound Description (Comments): Access site    Assessments 12/04/2023  5:24 PM 12/11/2023  7:18 AM  Site Assessment -- Clean;Dry;Intact  Peri-Wound Assessment -- Clean;Dry;Intact  Closure Vascular closure device --  Drainage Amount -- None  Wound Odor -- None  Dressing Gauze;Transparent dressing Topical skin adhesive  Dressing  Status -- Clean;Dry;Intact     No associated orders.     Wound 12/06/23 Incision Thigh Anterior;Right;Medial (Active)  Date First Assessed/Time First Assessed: 12/06/23 1432   Pre-Existing Wound: No  Primary Wound Type: Incision  Location: Thigh  Wound Location Orientation: Anterior;Right;Medial  Wound Description (Comments): Surical Incision    Assessments 12/06/2023  8:43 AM 12/11/2023  7:18 AM  Site Assessment Unable to assess Clean;Dry;Intact  Peri-Wound Assessment Unable to assess Clean;Dry;Intact  Edges Unable to assess Approximated with staples  Closure None Staples  Drainage Amount None None  Drainage Description -- Unable to assess  Wound Odor None None  Dressing Open to air  Open to air  Dressing Status -- Clean;Dry;Intact  State of Healing -- Improving     No associated orders.     Wound 12/06/23 Incision Tibial Proximal;Right (Active)  Date First Assessed/Time First Assessed: 12/06/23 1432   Pre-Existing Wound: No  Primary Wound Type: Incision  Location: Tibial  Wound Location Orientation: Proximal;Right  Wound Description (Comments): Surgical Incision    Assessments 12/07/2023  3:53 AM 12/11/2023  7:18 AM  Site Assessment Covered by dressing Clean;Dry;Intact  Peri-Wound Assessment Unable to assess --  Wound Odor -- None  Dressing -- Open to air  Treatments -- Site care     No associated orders.    ASSESMENT: Encounter Diagnoses  Name Primary?  . Type 2 diabetes mellitus with foot ulcer (CODE)    (CMD) Yes  . PAD (peripheral artery disease)   . Chronic osteomyelitis of right foot    (CMD)   . Diabetic polyneuropathy associated with type 2 diabetes mellitus    (CMD)      PLAN: Clinically seems to be progressing.  Fortunately the wound is not an obvious weightbearing area although the foot lateral turning is likely going to be a long-term challenge.  Transition to Vaseline gauze dressings. Wash daily with soap and water as best as possible. Peg assist shoe insert  with additional felt offloading over the heel to prevent rubbing and shearing over the mid foot where the wound is located.  Patient was evaluated today for diabetic orthotics. Please see prescription for diabetic orthotic shoes under Letters tab dated today. 01/11/24  This patient has diabetes complicated with neuropathy. I am treating the patient under a comprehensive plan of care for diabetes. Patient would benefit form diabetic footwear to protect his feet and diminish the risk of diabetic foot ulcers.  Lab Results  Component Value Date   HGBA1C 13.1 (H) 11/26/2023    FOOT EXAM: Patient has in sock neuropathy both feet.  Charcot deformity: NO Toe deformities: YES Calluses : YES Open wounds: yes , as noticed in above note   Orders Placed This Encounter  Procedures  . Debridement Diabetic Ulcer Anterior;Right;Lateral Foot    This order was created via procedure documentation  . Wound Care Diabetic Ulcer Anterior;Right;Lateral Foot    Wound cleansing::   Other (Specify)             soap and water    Dressing change frequency::   Change dressing every day    Primary wound dressing::   Other (Specify)             vaseline gauze    Secondary wound dressing::   4x4 Gauze    Secure with::   Tape    Off-loading::   PEG assist insert    Additional Orders/Instructions::   Follow nutritous diet    Patriciaann Aloha Carmel, MD Thu 01/11/2024  Disclaimer: Portions of this note were dictated using [employer].  It has been reviewed for accuracy, but may contain grammatical and clerical errors.

## 2024-01-12 DIAGNOSIS — M86171 Other acute osteomyelitis, right ankle and foot: Secondary | ICD-10-CM | POA: Diagnosis not present

## 2024-01-15 ENCOUNTER — Telehealth: Payer: Self-pay | Admitting: *Deleted

## 2024-01-15 NOTE — Progress Notes (Signed)
 Complex Care Management Care Guide Note  01/15/2024 Name: [name] MRN: [id number] DOB: 1970/10/13  [name] is a 53 y.o. year old adult who is a primary care patient of Celestia Rosaline SQUIBB, NP and is actively engaged with the care management team. I reached out to Elspeth DELENA Sluder by phone today to assist with re-scheduling  with the BSW.  Follow up plan: Unsuccessful telephone outreach attempt made. A HIPAA compliant phone message was left for the patient providing contact information and requesting a return call.  Harlene Satterfield  [facility]  [facility] Guide  Direct Dial: [phone]  Fax [phone]

## 2024-01-22 NOTE — Progress Notes (Signed)
 Complex Care Management Care Guide Note  01/22/2024 Name: [name] MRN: [id number] DOB: Oct 26, 1970  [name] is a 53 y.o. year old adult who is a primary care patient of Celestia Rosaline SQUIBB, NP and is actively engaged with the care management team. I reached out to Elspeth DELENA Sluder by phone today to assist with re-scheduling  with the BSW.  Follow up plan: Unsuccessful telephone outreach attempt made. No further outreach attempts will be made at this time. We have been unable to contact the patient to reschedule for complex care management services.   Harlene Satterfield  [facility]  [facility], [facility] Guide  Direct Dial: [phone]  Fax [phone]

## 2024-01-23 DIAGNOSIS — Z419 Encounter for procedure for purposes other than remedying health state, unspecified: Secondary | ICD-10-CM | POA: Diagnosis not present

## 2024-02-01 ENCOUNTER — Ambulatory Visit (INDEPENDENT_AMBULATORY_CARE_PROVIDER_SITE_OTHER): Admitting: Primary Care

## 2024-02-01 DIAGNOSIS — Z95820 Peripheral vascular angioplasty status with implants and grafts: Secondary | ICD-10-CM | POA: Diagnosis not present

## 2024-02-01 DIAGNOSIS — Z8673 Personal history of transient ischemic attack (TIA), and cerebral infarction without residual deficits: Secondary | ICD-10-CM | POA: Diagnosis not present

## 2024-02-01 DIAGNOSIS — L97512 Non-pressure chronic ulcer of other part of right foot with fat layer exposed: Secondary | ICD-10-CM | POA: Diagnosis not present

## 2024-02-01 DIAGNOSIS — E1151 Type 2 diabetes mellitus with diabetic peripheral angiopathy without gangrene: Secondary | ICD-10-CM | POA: Diagnosis not present

## 2024-02-01 DIAGNOSIS — E1140 Type 2 diabetes mellitus with diabetic neuropathy, unspecified: Secondary | ICD-10-CM | POA: Diagnosis not present

## 2024-02-01 DIAGNOSIS — Z87891 Personal history of nicotine dependence: Secondary | ICD-10-CM | POA: Diagnosis not present

## 2024-02-01 DIAGNOSIS — Z794 Long term (current) use of insulin: Secondary | ICD-10-CM | POA: Diagnosis not present

## 2024-02-01 DIAGNOSIS — Z5900 Homelessness unspecified: Secondary | ICD-10-CM | POA: Diagnosis not present

## 2024-02-01 DIAGNOSIS — E11621 Type 2 diabetes mellitus with foot ulcer: Secondary | ICD-10-CM | POA: Diagnosis not present

## 2024-02-02 DIAGNOSIS — S91301A Unspecified open wound, right foot, initial encounter: Secondary | ICD-10-CM | POA: Diagnosis not present

## 2024-02-02 DIAGNOSIS — S81801A Unspecified open wound, right lower leg, initial encounter: Secondary | ICD-10-CM | POA: Diagnosis not present

## 2024-02-09 NOTE — Congregational Nurse Program (Signed)
  Dept: [phone]   Congregational Nurse Program Note  Date of Encounter: 02/09/2024  Past Medical History: Past Medical History:  Diagnosis Date   Anxiety    Asthma    Depression    Diabetes mellitus    Gait abnormality    GERD (gastroesophageal reflux disease)    HIV disease (HCC)    Hypertension    Loss of balance 09/14/2020   MRSA carrier    neck   Perirectal abscess 03/30/2017   Pneumonia    Polyuria 04/27/2015   Poorly controlled diabetes mellitus (HCC) 04/27/2015   Testicular mass 06/20/2016   Transgender 04/27/2015    Encounter Details:  Community Questionnaire - 02/09/24 1233       Questionnaire   Ask client: Do you give verbal consent for me to treat you today? Yes    Student Assistance N/A    Location Patient Served  [facility]    Encounter Setting Phone/Text/Email    Population Status Unhoused    Insurance Medicaid    Insurance/Financial Assistance Referral N/A    Medication N/A    Medical Provider Yes    Screening Referrals Made N/A    Medical Referrals Made N/A    Medical Appointment Completed N/A    CNP Interventions Advocate/Support;Navigate Healthcare System;Case Management    Screenings CN Performed N/A    ED Visit Averted N/A    Life-Saving Intervention Made N/A         RN received referral from [facility] Case Manager Slater NOVAK. RN left a voicemail recently. Client is requesting assistance with housing support. RN was able to connect client to [facility] case workers and social work department to assist with housing. RN will continue to support as needed. No further concerns at this time.

## 2024-02-15 DIAGNOSIS — F17210 Nicotine dependence, cigarettes, uncomplicated: Secondary | ICD-10-CM | POA: Diagnosis not present

## 2024-02-15 DIAGNOSIS — L97519 Non-pressure chronic ulcer of other part of right foot with unspecified severity: Secondary | ICD-10-CM | POA: Diagnosis not present

## 2024-02-15 DIAGNOSIS — F4312 Post-traumatic stress disorder, chronic: Secondary | ICD-10-CM | POA: Diagnosis not present

## 2024-02-15 DIAGNOSIS — F333 Major depressive disorder, recurrent, severe with psychotic symptoms: Secondary | ICD-10-CM | POA: Diagnosis not present

## 2024-02-15 DIAGNOSIS — F411 Generalized anxiety disorder: Secondary | ICD-10-CM | POA: Diagnosis not present

## 2024-02-21 DIAGNOSIS — L03115 Cellulitis of right lower limb: Secondary | ICD-10-CM | POA: Diagnosis not present

## 2024-02-23 DIAGNOSIS — Z419 Encounter for procedure for purposes other than remedying health state, unspecified: Secondary | ICD-10-CM | POA: Diagnosis not present

## 2024-02-29 DIAGNOSIS — M86171 Other acute osteomyelitis, right ankle and foot: Secondary | ICD-10-CM | POA: Diagnosis not present

## 2024-02-29 DIAGNOSIS — E11621 Type 2 diabetes mellitus with foot ulcer: Secondary | ICD-10-CM | POA: Diagnosis not present

## 2024-02-29 DIAGNOSIS — Z794 Long term (current) use of insulin: Secondary | ICD-10-CM | POA: Diagnosis not present

## 2024-02-29 DIAGNOSIS — E1169 Type 2 diabetes mellitus with other specified complication: Secondary | ICD-10-CM | POA: Diagnosis not present

## 2024-02-29 DIAGNOSIS — Z87891 Personal history of nicotine dependence: Secondary | ICD-10-CM | POA: Diagnosis not present

## 2024-02-29 DIAGNOSIS — Z5900 Homelessness unspecified: Secondary | ICD-10-CM | POA: Diagnosis not present

## 2024-02-29 DIAGNOSIS — L97512 Non-pressure chronic ulcer of other part of right foot with fat layer exposed: Secondary | ICD-10-CM | POA: Diagnosis not present

## 2024-02-29 DIAGNOSIS — Z21 Asymptomatic human immunodeficiency virus [HIV] infection status: Secondary | ICD-10-CM | POA: Diagnosis not present

## 2024-02-29 DIAGNOSIS — I739 Peripheral vascular disease, unspecified: Secondary | ICD-10-CM | POA: Diagnosis not present

## 2024-03-04 DIAGNOSIS — S91301A Unspecified open wound, right foot, initial encounter: Secondary | ICD-10-CM | POA: Diagnosis not present

## 2024-03-04 DIAGNOSIS — S81801A Unspecified open wound, right lower leg, initial encounter: Secondary | ICD-10-CM | POA: Diagnosis not present

## 2024-03-14 DIAGNOSIS — L97512 Non-pressure chronic ulcer of other part of right foot with fat layer exposed: Secondary | ICD-10-CM | POA: Diagnosis not present

## 2024-03-14 DIAGNOSIS — E11621 Type 2 diabetes mellitus with foot ulcer: Secondary | ICD-10-CM | POA: Diagnosis not present

## 2024-03-14 DIAGNOSIS — Z794 Long term (current) use of insulin: Secondary | ICD-10-CM | POA: Diagnosis not present

## 2024-03-14 DIAGNOSIS — E1165 Type 2 diabetes mellitus with hyperglycemia: Secondary | ICD-10-CM | POA: Diagnosis not present

## 2024-03-14 DIAGNOSIS — L98492 Non-pressure chronic ulcer of skin of other sites with fat layer exposed: Secondary | ICD-10-CM | POA: Diagnosis not present

## 2024-03-14 DIAGNOSIS — M86171 Other acute osteomyelitis, right ankle and foot: Secondary | ICD-10-CM | POA: Diagnosis not present

## 2024-03-14 DIAGNOSIS — Z5900 Homelessness unspecified: Secondary | ICD-10-CM | POA: Diagnosis not present

## 2024-03-14 DIAGNOSIS — Z8673 Personal history of transient ischemic attack (TIA), and cerebral infarction without residual deficits: Secondary | ICD-10-CM | POA: Diagnosis not present

## 2024-03-14 DIAGNOSIS — Z87891 Personal history of nicotine dependence: Secondary | ICD-10-CM | POA: Diagnosis not present

## 2024-03-14 DIAGNOSIS — Z21 Asymptomatic human immunodeficiency virus [HIV] infection status: Secondary | ICD-10-CM | POA: Diagnosis not present

## 2024-03-18 DIAGNOSIS — L97519 Non-pressure chronic ulcer of other part of right foot with unspecified severity: Secondary | ICD-10-CM | POA: Diagnosis not present

## 2024-03-20 DIAGNOSIS — M86171 Other acute osteomyelitis, right ankle and foot: Secondary | ICD-10-CM | POA: Diagnosis not present

## 2024-03-20 DIAGNOSIS — S92341D Displaced fracture of fourth metatarsal bone, right foot, subsequent encounter for fracture with routine healing: Secondary | ICD-10-CM | POA: Diagnosis not present

## 2024-03-20 DIAGNOSIS — Z89431 Acquired absence of right foot: Secondary | ICD-10-CM | POA: Diagnosis not present

## 2024-03-20 DIAGNOSIS — M79671 Pain in right foot: Secondary | ICD-10-CM | POA: Diagnosis not present

## 2024-03-20 DIAGNOSIS — Z89421 Acquired absence of other right toe(s): Secondary | ICD-10-CM | POA: Diagnosis not present

## 2024-03-20 DIAGNOSIS — L97512 Non-pressure chronic ulcer of other part of right foot with fat layer exposed: Secondary | ICD-10-CM | POA: Diagnosis not present

## 2024-05-15 DIAGNOSIS — F411 Generalized anxiety disorder: Secondary | ICD-10-CM | POA: Diagnosis not present

## 2024-05-15 DIAGNOSIS — F17210 Nicotine dependence, cigarettes, uncomplicated: Secondary | ICD-10-CM | POA: Diagnosis not present

## 2024-05-15 DIAGNOSIS — F333 Major depressive disorder, recurrent, severe with psychotic symptoms: Secondary | ICD-10-CM | POA: Diagnosis not present

## 2024-05-15 DIAGNOSIS — F4312 Post-traumatic stress disorder, chronic: Secondary | ICD-10-CM | POA: Diagnosis not present

## 2024-05-24 ENCOUNTER — Ambulatory Visit (INDEPENDENT_AMBULATORY_CARE_PROVIDER_SITE_OTHER): Admitting: Internal Medicine

## 2024-05-24 ENCOUNTER — Other Ambulatory Visit: Payer: Self-pay

## 2024-05-24 ENCOUNTER — Other Ambulatory Visit (HOSPITAL_COMMUNITY): Payer: Self-pay

## 2024-05-24 ENCOUNTER — Other Ambulatory Visit: Payer: Self-pay | Admitting: Pharmacist

## 2024-05-24 ENCOUNTER — Other Ambulatory Visit (HOSPITAL_COMMUNITY)
Admission: RE | Admit: 2024-05-24 | Discharge: 2024-05-24 | Disposition: A | Source: Ambulatory Visit | Attending: Internal Medicine | Admitting: Internal Medicine

## 2024-05-24 ENCOUNTER — Encounter: Payer: Self-pay | Admitting: Internal Medicine

## 2024-05-24 VITALS — BP 126/77 | HR 79 | Temp 98.0°F | Ht 71.0 in | Wt 233.0 lb

## 2024-05-24 DIAGNOSIS — L97511 Non-pressure chronic ulcer of other part of right foot limited to breakdown of skin: Secondary | ICD-10-CM

## 2024-05-24 DIAGNOSIS — B20 Human immunodeficiency virus [HIV] disease: Secondary | ICD-10-CM

## 2024-05-24 DIAGNOSIS — Z79899 Other long term (current) drug therapy: Secondary | ICD-10-CM | POA: Diagnosis not present

## 2024-05-24 DIAGNOSIS — Z113 Encounter for screening for infections with a predominantly sexual mode of transmission: Secondary | ICD-10-CM | POA: Diagnosis not present

## 2024-05-24 DIAGNOSIS — E11621 Type 2 diabetes mellitus with foot ulcer: Secondary | ICD-10-CM

## 2024-05-24 DIAGNOSIS — E1140 Type 2 diabetes mellitus with diabetic neuropathy, unspecified: Secondary | ICD-10-CM | POA: Diagnosis not present

## 2024-05-24 DIAGNOSIS — E1142 Type 2 diabetes mellitus with diabetic polyneuropathy: Secondary | ICD-10-CM

## 2024-05-24 MED ORDER — BIKTARVY 50-200-25 MG PO TABS
1.0000 | ORAL_TABLET | Freq: Every day | ORAL | 5 refills | Status: AC
  Filled 2024-05-24: qty 60, 60d supply, fill #0

## 2024-05-24 NOTE — Progress Notes (Unsigned)
 Patient ID: [name], adult   DOB: July 28, 1970, 53 y.o.   MRN: [id number]  HPI Last took biktarvy  in july  Homeless presently, and living in [place] , [facility] but not [facility]-- staying there since recovering from vascular surgery to right leg to help with PVD and healing of right lateral foot DFU..  For 2 months  [facility] for housing app-- case management  [employer]  Mailing address= [address], [place], [place] [address]  Outpatient Encounter Medications as of 05/24/2024  Medication Sig   ACCU-CHEK SOFTCLIX LANCETS lancets 1 each by Other route 3 (three) times daily.   aspirin  EC 81 MG tablet Take 81 mg by mouth daily.   busPIRone  (BUSPAR ) 10 MG tablet Take 1 tablet (10 mg total) by mouth 2 (two) times daily for anxiety. (Patient taking differently: Take 20 mg by mouth 2 (two) times daily.)   cariprazine  (VRAYLAR ) 1.5 MG capsule Take 1 capsule (1.5 mg total) by mouth daily. (Patient taking differently: Take 3 mg by mouth daily.)   Dulaglutide  (TRULICITY ) 3 MG/0.5ML SOAJ Inject 3 mg as directed once a week.   FLUoxetine  (PROZAC ) 20 MG tablet Take 1 tablet (20 mg total) by mouth daily.   gabapentin  (NEURONTIN ) 300 MG capsule Take by mouth.   glipiZIDE  (GLUCOTROL  XL) 10 MG 24 hr tablet Take 1 tablet (10 mg total) by mouth daily with breakfast.   rosuvastatin  (CRESTOR ) 20 MG tablet Take 1 tablet (20 mg total) by mouth daily.   spironolactone  (ALDACTONE ) 100 MG tablet Take 1 tablet by mouth daily.   albuterol  (VENTOLIN  HFA) 108 (90 Base) MCG/ACT inhaler Inhale 2 puffs into the lungs every 6 (six) hours as needed for wheezing or shortness of breath. (Patient not taking: Reported on 05/24/2024)   bictegravir-emtricitabine -tenofovir  AF (BIKTARVY ) 50-200-25 MG TABS tablet Take 1 tablet by mouth daily. (Patient not taking: Reported on 05/24/2024)   blood glucose meter kit and supplies Dispense based on patient and insurance preference. Use up to four times daily as directed.  (FOR ICD-10 E10.9, E11.9).   Continuous Glucose Sensor (FREESTYLE LIBRE 3 SENSOR) MISC Place 1 sensor on the skin every 14 days. Use to check glucose continuously (Patient not taking: Reported on 05/24/2024)   docusate sodium  (COLACE CLEAR) 50 MG capsule Take 1 capsule (50 mg total) by mouth 2 times daily. (Patient not taking: Reported on 05/24/2024)   estradiol  (ESTRACE ) 2 MG tablet Take 1 tablet (2 mg total) by mouth daily.   Lancet Devices (ONETOUCH DELICA PLUS LANCING) MISC Use as directed   metFORMIN  (GLUCOPHAGE ) 1000 MG tablet Take 1 tablet (1,000 mg total) by mouth 2 (two) times daily with a meal. (Patient not taking: Reported on 05/24/2024)   mirtazapine  (REMERON ) 15 MG tablet Take 1 tablet (15 mg total) by mouth at bedtime for sleep. (Patient not taking: Reported on 05/24/2024)   mupirocin  ointment (BACTROBAN ) 2 % Apply 1 Application topically daily. (Patient not taking: Reported on 05/24/2024)   oxyCODONE  (OXY IR/ROXICODONE ) 5 MG immediate release tablet Take 1 tablet (5 mg) by mouth every 4 hours as needed for severe pain. (Patient not taking: Reported on 05/24/2024)   PROAIR  HFA 108 (90 Base) MCG/ACT inhaler INHALE 2 PUFFS BY MOUTH INTO LUNGS EVERY 6 HOURS AS NEEDED FOR WHEEZING OR SHORTNESS OF BREATH   No facility-administered encounter medications on file as of 05/24/2024.     Patient Active Problem List   Diagnosis Date Noted   Inguinal swelling 10/18/2022   Acute cystitis with hematuria 10/18/2022  Epididymo-orchitis 10/17/2022   Right foot drop 09/15/2020   Gait abnormality 09/15/2020   DM type 2 with diabetic peripheral neuropathy (HCC) 09/15/2020   Loss of balance 09/14/2020   S/P lumbar fusion 01/09/2019   Encounter for counseling for tobacco use disorder 09/12/2018   Ischemic stroke (HCC) 10/31/2017   Visual field defect    Complicated migraine    Migraine with visual aura    Essential hypertension 06/01/2016   Type 2 diabetes mellitus with obesity 06/01/2016    Poorly controlled diabetes mellitus (HCC) 04/27/2015   Hearing loss secondary to cerumen impaction 02/07/2013   Smoker 03/07/2011   Asthma 02/07/2008   Male-to-male transgender person 10/31/2007   Depression with anxiety 10/17/2007   Human immunodeficiency virus (HIV) disease (HCC) 04/14/2006   HIDRADENITIS SUPPURATIVA 04/14/2006     Health Maintenance Due  Topic Date Due   Mammogram  Never done   Zoster Vaccines- Shingrix (1 of 2) Never done   COVID-19 Vaccine (3 - Pfizer risk series) 03/27/2020   OPHTHALMOLOGY EXAM  04/25/2020   Pneumococcal Vaccine: 50+ Years (4 of 4 - PCV20 or PCV21) 06/10/2020   FOOT EXAM  08/18/2021   HEMOGLOBIN A1C  08/06/2023   Influenza Vaccine  01/12/2024   Diabetic kidney evaluation - eGFR measurement  02/02/2024     Review of Systems  Physical Exam   BP 126/77   Pulse 79   Temp 98 F (36.7 C) (Temporal)   Ht 5' 11 (1.803 m)   Wt 233 lb (105.7 kg)   SpO2 97%   BMI 32.50 kg/m    Lab Results  Component Value Date   CD4TCELL 41 02/02/2023   Lab Results  Component Value Date   CD4TABS 1,398 02/02/2023   CD4TABS 1,357 07/06/2022   CD4TABS 1,171 01/27/2021   Lab Results  Component Value Date   HIV1RNAQUANT 289 (H) 02/02/2023   Lab Results  Component Value Date   HEPBSAB REACTIVE (A) 04/13/2020   Lab Results  Component Value Date   LABRPR NON-REACTIVE 02/02/2023    CBC Lab Results  Component Value Date   WBC 10.5 02/02/2023   RBC 5.18 02/02/2023   HGB 15.7 02/02/2023   HCT 46.5 02/02/2023   PLT 301 02/02/2023   MCV 89.8 02/02/2023   MCH 30.3 02/02/2023   MCHC 33.8 02/02/2023   RDW 13.6 02/02/2023   LYMPHSABS 3,570 02/02/2023   MONOABS 0.9 10/16/2022   EOSABS 273 02/02/2023    BMET Lab Results  Component Value Date   NA 138 02/02/2023   K 3.9 02/02/2023   CL 102 02/02/2023   CO2 26 02/02/2023   GLUCOSE 228 (H) 02/02/2023   BUN 21 02/02/2023   CREATININE 0.74 02/02/2023   CALCIUM  9.5 02/02/2023   GFRNONAA  >60 10/20/2022   GFRAA >60 11/23/2018      Assessment and Plan   Flu vaccine Labs Refills on biktarvy , and restarting See back in 6 wk housing

## 2024-05-24 NOTE — Progress Notes (Signed)
 Patient contacted the pharmacy and requested to switch to mail order.    Delivery date: 05/27/24  Verified address: [address]  [place] [address]   Medication will be filled on: 05/24/24

## 2024-05-24 NOTE — Progress Notes (Signed)
 Specialty Pharmacy Initiation Note   [name] is a 53 y.o. adult who will be followed by the specialty pharmacy service for RxSp HIV    Review of administration, indication, effectiveness, safety, potential side effects, storage/disposable, and missed dose instructions occurred today for patient's specialty medication(s) Bictegravir-Emtricitab-Tenofov (Biktarvy )     Patient/Caregiver did not have any additional questions or concerns.   Patient's therapy is appropriate to: Initiate    Goals Addressed             This Visit's Progress    Achieve Undetectable HIV Viral Load < 20   Worsening    Patient is not on track and no change. Patient will maintain adherence and adhere to provider and/or lab appointments      Maintain optimal adherence to therapy       Patient is not on track and improving. Patient will work on increased adherence         Alan JINNY Geralds Specialty Pharmacist

## 2024-05-24 NOTE — Progress Notes (Signed)
 Specialty Pharmacy Initial Fill Coordination Note  [name] is a 53 y.o. adult contacted today regarding initial fill of specialty medication(s) Bictegravir-Emtricitab-Tenofov (Biktarvy )   Patient requested [facility] at [facility] [place] date: 05/24/24   Medication will be filled on: 05/24/24    Patient is aware of $0 copayment.

## 2024-05-27 LAB — COMPLETE METABOLIC PANEL WITHOUT GFR
AG Ratio: 1.2 (calc) (ref 1.0–2.5)
ALT: 14 U/L (ref 9–46)
AST: 18 U/L (ref 10–35)
Albumin: 4.2 g/dL (ref 3.6–5.1)
Alkaline phosphatase (APISO): 99 U/L (ref 35–144)
BUN/Creatinine Ratio: 26 (calc) — ABNORMAL HIGH (ref 6–22)
BUN: 18 mg/dL (ref 7–25)
CO2: 26 mmol/L (ref 20–32)
Calcium: 8.8 mg/dL (ref 8.6–10.3)
Chloride: 103 mmol/L (ref 98–110)
Creat: 0.69 mg/dL — ABNORMAL LOW (ref 0.70–1.30)
Globulin: 3.6 g/dL (ref 1.9–3.7)
Glucose, Bld: 165 mg/dL — ABNORMAL HIGH (ref 65–99)
Potassium: 4.3 mmol/L (ref 3.5–5.3)
Sodium: 136 mmol/L (ref 135–146)
Total Bilirubin: 0.4 mg/dL (ref 0.2–1.2)
Total Protein: 7.8 g/dL (ref 6.1–8.1)

## 2024-05-27 LAB — CBC WITH DIFFERENTIAL/PLATELET
Absolute Lymphocytes: 2249 {cells}/uL (ref 850–3900)
Absolute Monocytes: 617 {cells}/uL (ref 200–950)
Basophils Absolute: 32 {cells}/uL (ref 0–200)
Basophils Relative: 0.5 %
Eosinophils Absolute: 107 {cells}/uL (ref 15–500)
Eosinophils Relative: 1.7 %
HCT: 48.7 % (ref 39.4–51.1)
Hemoglobin: 16 g/dL (ref 13.2–17.1)
MCH: 29.5 pg (ref 27.0–33.0)
MCHC: 32.9 g/dL (ref 31.6–35.4)
MCV: 89.7 fL (ref 81.4–101.7)
MPV: 10.4 fL (ref 7.5–12.5)
Monocytes Relative: 9.8 %
Neutro Abs: 3295 {cells}/uL (ref 1500–7800)
Neutrophils Relative %: 52.3 %
Platelets: 254 Thousand/uL (ref 140–400)
RBC: 5.43 Million/uL (ref 4.20–5.80)
RDW: 12.8 % (ref 11.0–15.0)
Total Lymphocyte: 35.7 %
WBC: 6.3 Thousand/uL (ref 3.8–10.8)

## 2024-05-27 LAB — URINE CYTOLOGY ANCILLARY ONLY
Chlamydia: NEGATIVE
Comment: NEGATIVE
Comment: NORMAL
Neisseria Gonorrhea: NEGATIVE

## 2024-05-27 LAB — T-HELPER CELLS (CD4) COUNT (NOT AT ARMC)
Absolute CD4: 848 {cells}/uL (ref 490–1740)
CD4 T Helper %: 39 % (ref 30–61)
Total lymphocyte count: 2179 {cells}/uL (ref 850–3900)

## 2024-05-27 LAB — HIV-1 RNA QUANT-NO REFLEX-BLD
HIV 1 RNA Quant: 11100 {copies}/mL — ABNORMAL HIGH
HIV-1 RNA Quant, Log: 4.05 {Log_copies}/mL — ABNORMAL HIGH

## 2024-05-27 LAB — LIPID PANEL
Cholesterol: 170 mg/dL (ref <200)
HDL: 32 mg/dL — ABNORMAL LOW (ref >=40)
LDL Cholesterol (Calc): 110 mg/dL — ABNORMAL HIGH
Non-HDL Cholesterol (Calc): 138 mg/dL — ABNORMAL HIGH (ref <130)
Total CHOL/HDL Ratio: 5.3 (calc) — ABNORMAL HIGH (ref <5.0)
Triglycerides: 167 mg/dL — ABNORMAL HIGH (ref <150)

## 2024-05-27 LAB — SYPHILIS: RPR W/REFLEX TO RPR TITER AND TREPONEMAL ANTIBODIES, TRADITIONAL SCREENING AND DIAGNOSIS ALGORITHM: RPR Ser Ql: NONREACTIVE

## 2024-06-03 ENCOUNTER — Telehealth (INDEPENDENT_AMBULATORY_CARE_PROVIDER_SITE_OTHER): Payer: Self-pay | Admitting: Primary Care

## 2024-06-03 NOTE — Telephone Encounter (Signed)
 Called pt to confirm appt. Pt will be present.

## 2024-06-04 ENCOUNTER — Ambulatory Visit (INDEPENDENT_AMBULATORY_CARE_PROVIDER_SITE_OTHER): Admitting: Primary Care

## 2024-06-04 ENCOUNTER — Encounter (INDEPENDENT_AMBULATORY_CARE_PROVIDER_SITE_OTHER): Payer: Self-pay | Admitting: Primary Care

## 2024-06-04 VITALS — BP 135/85 | HR 74 | Resp 16 | Wt 233.2 lb

## 2024-06-04 DIAGNOSIS — E1165 Type 2 diabetes mellitus with hyperglycemia: Secondary | ICD-10-CM

## 2024-06-04 DIAGNOSIS — Z794 Long term (current) use of insulin: Secondary | ICD-10-CM | POA: Diagnosis not present

## 2024-06-04 LAB — POCT GLYCOSYLATED HEMOGLOBIN (HGB A1C): HbA1c, POC (controlled diabetic range): 7.9 % — AB (ref 0.0–7.0)

## 2024-06-16 ENCOUNTER — Encounter (INDEPENDENT_AMBULATORY_CARE_PROVIDER_SITE_OTHER): Payer: Self-pay | Admitting: Primary Care

## 2024-06-16 NOTE — Progress Notes (Signed)
 "  Subjective:  Patient ID: [name], adult    DOB: 08-31-1970  Age: 54 y.o. MRN: [id number]  CC: Diabetes   [name] presents forFollow-up of diabetes. Patient does not check blood sugar at home Diabetes    Compliant with meds - Yes Checking CBGs? Yes  Fasting avg -   Postprandial average -  Exercising regularly? - Yes Watching carbohydrate intake? - Yes Neuropathy ? - Yes Hypoglycemic events - No  - Recovers with :   Pertinent ROS:  Polyuria - Yes Polydipsia - No Vision problems - No  Medications as noted below. Taking them regularly without complication/adverse reaction being reported today.   History [name] has a past medical history of Anxiety, Asthma, Depression, Diabetes mellitus, Gait abnormality, GERD (gastroesophageal reflux disease), HIV disease (HCC), Hypertension, Loss of balance (09/14/2020), MRSA carrier, Perirectal abscess (03/30/2017), Pneumonia, Polyuria (04/27/2015), Poorly controlled diabetes mellitus (HCC) (04/27/2015), Testicular mass (06/20/2016), and Transgender (04/27/2015).   She has a past surgical history that includes Appendectomy and Transforaminal lumbar interbody fusion (tlif) with pedicle screw fixation 3 level (N/A, 01/09/2019).   Her family history includes Diabetes in her father; Heart failure in her father; Other in her mother.She reports that she has been smoking cigarettes. She started smoking about 32 years ago. She has a 2.7 pack-year smoking history. She has never used smokeless tobacco. She reports that she does not currently use drugs after having used the following drugs: Marijuana. She reports that she does not drink alcohol.  Medications Ordered Prior to Encounter[1]  Review of Systems Comprehensive ROS Pertinent positive and negative noted in HPI   Objective:  BP 135/85   Pulse 74   Resp 16   Wt 233 lb 3.2 oz (105.8 kg)   SpO2 100%   BMI 32.52 kg/m   BP Readings from Last 3 Encounters:  06/04/24 135/85  05/24/24  126/77  01/04/24 115/79    Wt Readings from Last 3 Encounters:  06/04/24 233 lb 3.2 oz (105.8 kg)  05/24/24 233 lb (105.7 kg)  01/04/24 231 lb (104.8 kg)    Physical Exam Vitals reviewed.  Constitutional:      Appearance: Normal appearance. She is obese.  HENT:     Head: Normocephalic.     Right Ear: Tympanic membrane, ear canal and external ear normal.     Left Ear: Tympanic membrane, ear canal and external ear normal.     Nose: Nose normal.     Mouth/Throat:     Mouth: Mucous membranes are moist.  Eyes:     Extraocular Movements: Extraocular movements intact.     Pupils: Pupils are equal, round, and reactive to light.  Cardiovascular:     Rate and Rhythm: Normal rate.  Pulmonary:     Effort: Pulmonary effort is normal.     Breath sounds: Normal breath sounds.  Abdominal:     General: Bowel sounds are normal.     Palpations: Abdomen is soft.  Musculoskeletal:        General: Normal range of motion.     Cervical back: Normal range of motion.  Skin:    General: Skin is warm and dry.  Neurological:     Mental Status: She is alert and oriented to person, place, and time.  Psychiatric:        Mood and Affect: Mood normal.        Behavior: Behavior normal.        Thought Content: Thought content normal.  Lab Results  Component Value Date   HGBA1C 7.9 (A) 06/04/2024   HGBA1C 8.0 (A) 02/03/2023   HGBA1C 7.0 11/03/2022    Lab Results  Component Value Date   WBC 6.3 05/24/2024   HGB 16.0 05/24/2024   HCT 48.7 05/24/2024   PLT 254 05/24/2024   GLUCOSE 165 (H) 05/24/2024   CHOL 170 05/24/2024   TRIG 167 (H) 05/24/2024   HDL 32 (L) 05/24/2024   LDLCALC 110 (H) 05/24/2024   ALT 14 05/24/2024   AST 18 05/24/2024   NA 136 05/24/2024   K 4.3 05/24/2024   CL 103 05/24/2024   CREATININE 0.69 (L) 05/24/2024   BUN 18 05/24/2024   CO2 26 05/24/2024   TSH 1.090 12/06/2017   INR 1.1 08/27/2019   HGBA1C 7.9 (A) 06/04/2024   MICROALBUR 2.50 (H) 07/20/2011     Title   Diabetic Foot Exam - detailed    Semmes-Weinstein Monofilament Test + means has sensation and - means no sensation      Image components are not supported.   Image components are not supported. Image components are not supported.  [place] Comments      Assessment & Plan:   [name] was seen today for diabetes.  Diagnoses and all orders for this visit:  Uncontrolled type 2 diabetes mellitus with hyperglycemia, with long-term current use of insulin  (HCC) -     POCT glycosylated hemoglobin (Hb A1C) 7.9 improving - educated on lifestyle modifications, including but not limited to diet choices and adding exercise to daily routine.       Follow-up:  Return in about 3 months (around 09/02/2024) for fasting labs.  The above assessment and management plan was discussed with the patient. The patient verbalized understanding of and has agreed to the management plan. Patient is aware to call the clinic if symptoms fail to improve or worsen. Patient is aware when to return to the clinic for a follow-up visit. Patient educated on when it is appropriate to go to the emergency department.   [name] Bohr, NP-C      [1]  Current Outpatient Medications on File Prior to Visit  Medication Sig Dispense Refill   ACCU-CHEK SOFTCLIX LANCETS lancets 1 each by Other route 3 (three) times daily. 100 each 5   albuterol  (VENTOLIN  HFA) 108 (90 Base) MCG/ACT inhaler Inhale 2 puffs into the lungs every 6 (six) hours as needed for wheezing or shortness of breath. (Patient not taking: Reported on 05/24/2024) 6.7 g 0   aspirin  EC 81 MG tablet Take 81 mg by mouth daily.     bictegravir-emtricitabine -tenofovir  AF (BIKTARVY ) 50-200-25 MG TABS tablet Take 1 tablet by mouth daily. 60 tablet 5   blood glucose meter kit and supplies Dispense based on patient and insurance preference. Use up to four times daily as directed. (FOR ICD-10 E10.9, E11.9). 1 each 0   busPIRone   (BUSPAR ) 10 MG tablet Take 1 tablet (10 mg total) by mouth 2 (two) times daily for anxiety. (Patient taking differently: Take 20 mg by mouth 2 (two) times daily.) 60 tablet 0   cariprazine  (VRAYLAR ) 1.5 MG capsule Take 1 capsule (1.5 mg total) by mouth daily. (Patient taking differently: Take 3 mg by mouth daily.) 30 capsule 0   Dulaglutide  (TRULICITY ) 3 MG/0.5ML SOAJ Inject 3 mg as directed once a week. 6 mL 1   estradiol  (ESTRACE ) 2 MG tablet Take 1 tablet (2 mg total) by mouth daily. 30 tablet 11   FLUoxetine  (PROZAC ) 20 MG  tablet Take 1 tablet (20 mg total) by mouth daily. 90 tablet 1   gabapentin  (NEURONTIN ) 300 MG capsule Take by mouth.     glipiZIDE  (GLUCOTROL  XL) 10 MG 24 hr tablet Take 1 tablet (10 mg total) by mouth daily with breakfast. 90 tablet 1   Lancet Devices (ONETOUCH DELICA PLUS LANCING) MISC Use as directed 1 each 0   PROAIR  HFA 108 (90 Base) MCG/ACT inhaler INHALE 2 PUFFS BY MOUTH INTO LUNGS EVERY 6 HOURS AS NEEDED FOR WHEEZING OR SHORTNESS OF BREATH 8.5 g 1   rosuvastatin  (CRESTOR ) 20 MG tablet Take 1 tablet (20 mg total) by mouth daily. 90 tablet 0   spironolactone  (ALDACTONE ) 100 MG tablet Take 1 tablet by mouth daily.     No current facility-administered medications on file prior to visit.   "

## 2024-06-21 ENCOUNTER — Encounter: Payer: Self-pay | Admitting: Pharmacist

## 2024-06-21 ENCOUNTER — Ambulatory Visit: Payer: Self-pay | Attending: Primary Care | Admitting: Pharmacist

## 2024-06-21 ENCOUNTER — Telehealth: Payer: Self-pay | Admitting: Pharmacist

## 2024-06-21 ENCOUNTER — Other Ambulatory Visit: Payer: Self-pay

## 2024-06-21 DIAGNOSIS — Z7985 Long-term (current) use of injectable non-insulin antidiabetic drugs: Secondary | ICD-10-CM

## 2024-06-21 DIAGNOSIS — E119 Type 2 diabetes mellitus without complications: Secondary | ICD-10-CM | POA: Diagnosis not present

## 2024-06-21 DIAGNOSIS — Z7984 Long term (current) use of oral hypoglycemic drugs: Secondary | ICD-10-CM

## 2024-06-21 MED ORDER — FREESTYLE LIBRE 3 PLUS SENSOR MISC
6 refills | Status: AC
  Filled 2024-06-21: qty 2, 30d supply, fill #0

## 2024-06-21 MED ORDER — SEMAGLUTIDE (2 MG/DOSE) 8 MG/3ML ~~LOC~~ SOPN
2.0000 mg | PEN_INJECTOR | SUBCUTANEOUS | 3 refills | Status: AC
  Filled 2024-06-21: qty 3, 28d supply, fill #0

## 2024-06-21 NOTE — Telephone Encounter (Signed)
 Can we attempt a PA for her Ozempic ? Has tried and failed Trulicity  due to suboptimal A1c control.

## 2024-06-21 NOTE — Progress Notes (Signed)
" ° ° °  S:    54 y.o. adult who presents for diabetes evaluation, education, and management via telemedicine. 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   Patient was referred and last seen by Primary Care Provider, Rosaline Bohr, on 06/04/24. Last seen by pharmacist on 03/10/23.   At her visit with [facility], A1c was found to be 7.9%. She was referred to me for further management. Today, patient arrives in good spirits and presents without any assistance. She is adherent to glipizide  once daily + Trulicity  once weekly at the 3mg  dose. Denies any NV, abdominal pain. No changes in vision.   Family/Social History:  -Fhx: HF, DM -Tobacco: former; quit in 2020 -Alcohol: denies  Current diabetes medications include: glipizide  10mg  XL once daily, Trulicity  3 mg weekly  Current hypertension medications include: none Current hyperlipidemia medications include: rosuvastatin  20 mg  Patient reports adherence to taking all medications as prescribed.   Insurance coverage: Medicaid   Patient denies hypoglycemic events.  Patient denies nocturia (nighttime urination).  Patient denies neuropathy (nerve pain). Patient denies visual changes. Patient reports self foot exams.    Patient reported dietary habits:  -Admits struggling with sweets but tries to maintain a healthy diet. Reports eating a lot of bread.    Patient-reported exercise habits: none reported   O:  Lab Results  Component Value Date   HGBA1C 7.9 (A) 06/04/2024   There were no vitals filed for this visit.  Lipid Panel     Component Value Date/Time   CHOL 170 05/24/2024 0909   CHOL 144 06/30/2022 1419   TRIG 167 (H) 05/24/2024 0909   HDL 32 (L) 05/24/2024 0909   HDL 29 (L) 06/30/2022 1419   CHOLHDL 5.3 (H) 05/24/2024 0909   VLDL 47 (H) 11/01/2017 0334   LDLCALC 110 (H) 05/24/2024 0909   Clinical Atherosclerotic  Cardiovascular Disease (ASCVD): Yes  The ASCVD Risk score (Arnett DK, et al., 2019) failed to calculate for the following reasons:   Risk score cannot be calculated because patient has a medical history suggesting prior/existing ASCVD   * - Cholesterol units were assumed   Patient is participating in a Managed Medicaid Plan: Yes   A/P: Diabetes longstanding, currently uncontrolled based on most recent A1c. Patient is able to verbalize appropriate hypoglycemia management plan. Medication adherence appears good. We will change Trulicity  to Ozempic  2 mg weekly.  -Continue glipizide  10mg  XL once daily.  -Stop Trulicity .  -Start Ozempic  2 mg weekly. Will pursue PA approval. -Patient educated on purpose, proper use, and potential adverse effects of medications.  -Extensively discussed pathophysiology of diabetes, recommended lifestyle interventions, dietary effects on blood sugar control.  -Counseled on s/sx of and management of hypoglycemia.  -Next A1c anticipated 08/2024.   Written patient instructions provided. Patient verbalized understanding of treatment plan.  Total time in face to face counseling 30 minutes.    Follow-up:  Pharmacist: 4-6 weeks  Herlene Fleeta Morris, PharmD, [place], CPP Clinical Pharmacist [facility] [phone]  "

## 2024-06-25 ENCOUNTER — Other Ambulatory Visit: Payer: Self-pay

## 2024-06-26 ENCOUNTER — Other Ambulatory Visit: Payer: Self-pay

## 2024-06-28 ENCOUNTER — Other Ambulatory Visit: Payer: Self-pay

## 2024-07-14 ENCOUNTER — Encounter: Payer: Self-pay | Admitting: Infectious Disease

## 2024-07-14 DIAGNOSIS — Z5900 Homelessness unspecified: Secondary | ICD-10-CM | POA: Insufficient documentation

## 2024-07-14 NOTE — Progress Notes (Unsigned)
 "  Subjective:   Chief complaint: follow-up for HIV disease on medications   Patient ID: [name], adult    DOB: 11/09/1970, 54 y.o.   MRN: [id number]  HPI  Past Medical History:  Diagnosis Date   Anxiety    Asthma    Depression    Diabetes mellitus    Gait abnormality    GERD (gastroesophageal reflux disease)    HIV disease (HCC)    Homeless 07/14/2024   Hypertension    Loss of balance 09/14/2020   MRSA carrier    neck   Perirectal abscess 03/30/2017   Pneumonia    Polyuria 04/27/2015   Poorly controlled diabetes mellitus (HCC) 04/27/2015   Testicular mass 06/20/2016   Transgender 04/27/2015    Past Surgical History:  Procedure Laterality Date   APPENDECTOMY     TRANSFORAMINAL LUMBAR INTERBODY FUSION (TLIF) WITH PEDICLE SCREW FIXATION 3 LEVEL N/A 01/09/2019   Procedure: TRANSFORAMINAL LUMBAR INTERBODY FUSION (TLIF) WITH PEDICLE SCREW FIXATION 3 LEVEL, POSTERIOR SPINAL FUSION;  Surgeon: Clois Fret, MD;  Location: [facility];  Service: Neurosurgery;  Laterality: N/A;    Family History  Problem Relation Age of Onset   Other Mother        unsure of history   Heart failure Father    Diabetes Father       Social History   Socioeconomic History   Marital status: Single    Spouse name: Not on file   Number of children: 0   Years of education: 11th   Highest education level: 12th grade  Occupational History   Occupation: Disabled   Tobacco Use   Smoking status: Every Day    Current packs/day: 0.00    Average packs/day: 0.1 packs/day for 27.0 years (2.7 ttl pk-yrs)    Types: Cigarettes    Start date: 07/20/1991    Last attempt to quit: 07/19/2018    Years since quitting: 5.9   Smokeless tobacco: Never   Tobacco comments:    started back yesterday  Vaping Use   Vaping status: Never Used  Substance and Sexual Activity   Alcohol use: No    Alcohol/week: 0.0 standard drinks of alcohol   Drug use: Not Currently    Types: Marijuana    Comment: 1-2  times per month   Sexual activity: Yes    Partners: Male    Comment: pt. given condoms  Other Topics Concern   Not on file  Social History Narrative   Drinks 64 ounces of Diet Pepper per day.    Left-handed.   Lives with a friend.   Social Drivers of Health   Tobacco Use: High Risk (06/21/2024)   Patient History    Smoking Tobacco Use: Every Day    Smokeless Tobacco Use: Never    Passive Exposure: Not on file  Financial Resource Strain: High Risk (10/31/2022)   Overall Financial Resource Strain (CARDIA)    Difficulty of Paying Living Expenses: Very hard  Food Insecurity: Unknown (01/11/2024)   Received from [facility]   Epic    Within the past 12 months, the food you bought just didn't last and you didn't have money to get more. : Patient declined to answer    Within the past 12 months, you worried that your food would run out before you got money to buy more: Patient declined to answer  Recent Concern: Food Insecurity - Food Insecurity Present (01/04/2024)   Epic    Worried About Programme Researcher, Broadcasting/film/video in  the Last Year: Sometimes true    Ran Out of Food in the Last Year: Often true  Transportation Needs: Not on file (01/11/2024)  Physical Activity: Unknown (10/31/2022)   Exercise Vital Sign    Days of Exercise per Week: 0 days    Minutes of Exercise per Session: Not on file  Stress: Stress Concern Present (10/31/2022)   [employer] of Occupational Health - Occupational Stress Questionnaire    Feeling of Stress : Very much  Social Connections: Socially Isolated (10/31/2022)   Social Connection and Isolation Panel    Frequency of Communication with Friends and Family: Once a week    Frequency of Social Gatherings with Friends and Family: Once a week    Attends Religious Services: 1 to 4 times per year    Active Member of [employer] or Organizations: No    Attends Engineer, Structural: Not on file    Marital Status: Never married  Depression (PHQ2-9): High Risk  (01/04/2024)   Depression (PHQ2-9)    PHQ-2 Score: 16  Alcohol Screen: Not on file  Housing: Not on file (01/11/2024)  Utilities: Unknown (01/11/2024)   Received from [facility]   Utilities    In the past 12 months has the electric, gas, oil, or water company threatened to shut off services in your home? : Patient declined to answer  Health Literacy: Not on file    Allergies[1]  Current Medications[2]   Review of Systems     Objective:   Physical Exam        Assessment & Plan:       [1]  Allergies Allergen Reactions   Propoxyphene N-Acetaminophen  Other (See Comments)    Stomach cramps  [2]  Current Outpatient Medications:    ACCU-CHEK SOFTCLIX LANCETS lancets, 1 each by Other route 3 (three) times daily., Disp: 100 each, Rfl: 5   albuterol  (VENTOLIN  HFA) 108 (90 Base) MCG/ACT inhaler, Inhale 2 puffs into the lungs every 6 (six) hours as needed for wheezing or shortness of breath. (Patient not taking: Reported on 05/24/2024), Disp: 6.7 g, Rfl: 0   aspirin  EC 81 MG tablet, Take 81 mg by mouth daily., Disp: , Rfl:    bictegravir-emtricitabine -tenofovir  AF (BIKTARVY ) 50-200-25 MG TABS tablet, Take 1 tablet by mouth daily., Disp: 60 tablet, Rfl: 5   blood glucose meter kit and supplies, Dispense based on patient and insurance preference. Use up to four times daily as directed. (FOR ICD-10 E10.9, E11.9)., Disp: 1 each, Rfl: 0   busPIRone  (BUSPAR ) 10 MG tablet, Take 1 tablet (10 mg total) by mouth 2 (two) times daily for anxiety. (Patient taking differently: Take 20 mg by mouth 2 (two) times daily.), Disp: 60 tablet, Rfl: 0   cariprazine  (VRAYLAR ) 1.5 MG capsule, Take 1 capsule (1.5 mg total) by mouth daily. (Patient taking differently: Take 3 mg by mouth daily.), Disp: 30 capsule, Rfl: 0   Continuous Glucose Sensor (FREESTYLE LIBRE 3 PLUS SENSOR) MISC, Change sensor every 15 days. Use to check blood sugar continuously., Disp: 2 each, Rfl: 6   estradiol  (ESTRACE ) 2 MG tablet,  Take 1 tablet (2 mg total) by mouth daily., Disp: 30 tablet, Rfl: 11   FLUoxetine  (PROZAC ) 20 MG tablet, Take 1 tablet (20 mg total) by mouth daily., Disp: 90 tablet, Rfl: 1   gabapentin  (NEURONTIN ) 300 MG capsule, Take by mouth., Disp: , Rfl:    glipiZIDE  (GLUCOTROL  XL) 10 MG 24 hr tablet, Take 1 tablet (10 mg total) by mouth daily with  breakfast., Disp: 90 tablet, Rfl: 1   Lancet Devices (ONETOUCH DELICA PLUS LANCING) MISC, Use as directed, Disp: 1 each, Rfl: 0   PROAIR  HFA 108 (90 Base) MCG/ACT inhaler, INHALE 2 PUFFS BY MOUTH INTO LUNGS EVERY 6 HOURS AS NEEDED FOR WHEEZING OR SHORTNESS OF BREATH, Disp: 8.5 g, Rfl: 1   rosuvastatin  (CRESTOR ) 20 MG tablet, Take 1 tablet (20 mg total) by mouth daily., Disp: 90 tablet, Rfl: 0   Semaglutide , 2 MG/DOSE, 8 MG/3ML SOPN, Inject 2 mg as directed once a week., Disp: 3 mL, Rfl: 3   spironolactone  (ALDACTONE ) 100 MG tablet, Take 1 tablet by mouth daily., Disp: , Rfl:   "

## 2024-07-18 ENCOUNTER — Ambulatory Visit: Payer: Self-pay | Admitting: Infectious Disease

## 2024-07-18 DIAGNOSIS — E1142 Type 2 diabetes mellitus with diabetic polyneuropathy: Secondary | ICD-10-CM

## 2024-07-18 DIAGNOSIS — B20 Human immunodeficiency virus [HIV] disease: Secondary | ICD-10-CM

## 2024-07-18 DIAGNOSIS — Z5900 Homelessness unspecified: Secondary | ICD-10-CM

## 2024-07-18 DIAGNOSIS — Z789 Other specified health status: Secondary | ICD-10-CM

## 2024-08-02 ENCOUNTER — Ambulatory Visit: Payer: Self-pay | Admitting: Pharmacist

## 2024-09-03 ENCOUNTER — Ambulatory Visit (INDEPENDENT_AMBULATORY_CARE_PROVIDER_SITE_OTHER): Payer: Self-pay | Admitting: Primary Care
